# Patient Record
Sex: Male | Born: 1940 | Race: White | Hispanic: No | Marital: Married | Smoking: Former smoker
Health system: Southern US, Community
[De-identification: ages and names within clinical notes are randomized; demographics above are authoritative.]

## PROBLEM LIST (undated history)

## (undated) DIAGNOSIS — I208 Other forms of angina pectoris: Secondary | ICD-10-CM

## (undated) DIAGNOSIS — F329 Major depressive disorder, single episode, unspecified: Secondary | ICD-10-CM

## (undated) DIAGNOSIS — I2089 Other forms of angina pectoris: Secondary | ICD-10-CM

## (undated) DIAGNOSIS — F4310 Post-traumatic stress disorder, unspecified: Secondary | ICD-10-CM

## (undated) DIAGNOSIS — R413 Other amnesia: Secondary | ICD-10-CM

## (undated) DIAGNOSIS — E785 Hyperlipidemia, unspecified: Secondary | ICD-10-CM

## (undated) DIAGNOSIS — I2510 Atherosclerotic heart disease of native coronary artery without angina pectoris: Secondary | ICD-10-CM

## (undated) DIAGNOSIS — J189 Pneumonia, unspecified organism: Secondary | ICD-10-CM

## (undated) DIAGNOSIS — I10 Essential (primary) hypertension: Secondary | ICD-10-CM

## (undated) DIAGNOSIS — D696 Thrombocytopenia, unspecified: Secondary | ICD-10-CM

## (undated) DIAGNOSIS — E559 Vitamin D deficiency, unspecified: Secondary | ICD-10-CM

## (undated) DIAGNOSIS — I5189 Other ill-defined heart diseases: Secondary | ICD-10-CM

## (undated) DIAGNOSIS — F32A Depression, unspecified: Secondary | ICD-10-CM

## (undated) DIAGNOSIS — I350 Nonrheumatic aortic (valve) stenosis: Secondary | ICD-10-CM

## (undated) DIAGNOSIS — H409 Unspecified glaucoma: Secondary | ICD-10-CM

## (undated) HISTORY — DX: Essential (primary) hypertension: I10

## (undated) HISTORY — DX: Major depressive disorder, single episode, unspecified: F32.9

## (undated) HISTORY — DX: Atherosclerotic heart disease of native coronary artery without angina pectoris: I25.10

## (undated) HISTORY — DX: Thrombocytopenia, unspecified: D69.6

## (undated) HISTORY — DX: Hyperlipidemia, unspecified: E78.5

## (undated) HISTORY — DX: Pneumonia, unspecified organism: J18.9

## (undated) HISTORY — PX: CAROTID ENDARTERECTOMY: SUR193

## (undated) HISTORY — DX: Other forms of angina pectoris: I20.8

## (undated) HISTORY — DX: Nonrheumatic aortic (valve) stenosis: I35.0

## (undated) HISTORY — DX: Post-traumatic stress disorder, unspecified: F43.10

## (undated) HISTORY — PX: TRANSTHORACIC ECHOCARDIOGRAM: SHX275

## (undated) HISTORY — DX: Depression, unspecified: F32.A

## (undated) HISTORY — DX: Other forms of angina pectoris: I20.89

## (undated) HISTORY — DX: Vitamin D deficiency, unspecified: E55.9

## (undated) NOTE — *Deleted (*Deleted)
Attempted to give PM meds in applesauce. Patient able to swallow small amount successfully.  Patient then took remaining med

---

## 2003-04-24 ENCOUNTER — Encounter: Payer: Self-pay | Admitting: Emergency Medicine

## 2003-04-24 ENCOUNTER — Emergency Department (HOSPITAL_COMMUNITY): Admission: EM | Admit: 2003-04-24 | Discharge: 2003-04-24 | Payer: Self-pay | Admitting: Emergency Medicine

## 2004-01-02 ENCOUNTER — Ambulatory Visit (HOSPITAL_COMMUNITY): Admission: RE | Admit: 2004-01-02 | Discharge: 2004-01-02 | Payer: Self-pay | Admitting: Vascular Surgery

## 2004-01-08 ENCOUNTER — Encounter (INDEPENDENT_AMBULATORY_CARE_PROVIDER_SITE_OTHER): Payer: Self-pay | Admitting: *Deleted

## 2004-01-08 ENCOUNTER — Inpatient Hospital Stay (HOSPITAL_COMMUNITY): Admission: RE | Admit: 2004-01-08 | Discharge: 2004-01-09 | Payer: Self-pay | Admitting: Vascular Surgery

## 2006-11-07 ENCOUNTER — Emergency Department (HOSPITAL_COMMUNITY): Admission: EM | Admit: 2006-11-07 | Discharge: 2006-11-08 | Payer: Self-pay | Admitting: Emergency Medicine

## 2006-11-08 ENCOUNTER — Ambulatory Visit (HOSPITAL_COMMUNITY): Admission: RE | Admit: 2006-11-08 | Discharge: 2006-11-08 | Payer: Self-pay | Admitting: Emergency Medicine

## 2010-01-13 LAB — FECAL OCCULT BLOOD, GUAIAC: Fecal Occult Blood: NEGATIVE

## 2010-01-21 ENCOUNTER — Encounter: Admission: RE | Admit: 2010-01-21 | Discharge: 2010-01-21 | Payer: Self-pay | Admitting: Family Medicine

## 2010-07-22 LAB — HEMOGLOBIN A1C: Hgb A1c MFr Bld: 5.8 % (ref 4.0–6.0)

## 2010-10-23 LAB — HM COLONOSCOPY

## 2010-10-23 LAB — HM DIABETES FOOT EXAM: HM Diabetic Foot Exam: NEGATIVE

## 2010-11-30 ENCOUNTER — Encounter: Payer: Self-pay | Admitting: *Deleted

## 2010-11-30 DIAGNOSIS — G4730 Sleep apnea, unspecified: Secondary | ICD-10-CM

## 2010-11-30 DIAGNOSIS — G629 Polyneuropathy, unspecified: Secondary | ICD-10-CM

## 2010-11-30 DIAGNOSIS — J449 Chronic obstructive pulmonary disease, unspecified: Secondary | ICD-10-CM

## 2010-11-30 DIAGNOSIS — M109 Gout, unspecified: Secondary | ICD-10-CM | POA: Insufficient documentation

## 2010-11-30 DIAGNOSIS — E785 Hyperlipidemia, unspecified: Secondary | ICD-10-CM

## 2010-11-30 DIAGNOSIS — L719 Rosacea, unspecified: Secondary | ICD-10-CM

## 2010-11-30 DIAGNOSIS — N529 Male erectile dysfunction, unspecified: Secondary | ICD-10-CM

## 2011-01-31 NOTE — Op Note (Signed)
NAME:  Dean Harris, Dean Harris                          ACCOUNT NO.:  [id number]   MEDICAL RECORD NO.:  [id number]                   PATIENT TYPE:  INP   LOCATION:  2899                                 FACILITY:  [facility]   PHYSICIAN:  Janetta Hora. Fields, MD               DATE OF BIRTH:  08-27-1941   DATE OF PROCEDURE:  01/08/2004  DATE OF DISCHARGE:                                 OPERATIVE REPORT   PROCEDURE PERFORMED:  Left carotid endarterectomy.   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   SURGEON:  Janetta Hora. Fields, MD   ANESTHESIA:  General.   ASSISTANT:  1. Larina Earthly, M.D.  2. Claudette Royston Sinner, N.P.   INDICATIONS FOR PROCEDURE:  The patient is a 70 year old male who is status  post previous right carotid endarterectomy in [place].  He presented for  routine duplex surveillance follow-up and was noted to have a greater than  80% stenosis by duplex scan.  A carotid angiogram was performed which showed  greater than 75% left internal carotid artery stenosis.  The reason for  carotid angiography was the high location of the lesion.  On ultrasound this  was up around the location of the mandible.  The carotid bifurcation on the  angiogram was approximately at the level of C3.  The patient also had a very  short neck.  The risks, benefits and possible complications of the operation  including but not limited to bleeding, myocardial infarction, stroke,  cranial nerve injury were explained to the patient preoperatively.   OPERATIVE FINDINGS:  1. A 10 French shunt.  2. Greater than 75% internal carotid artery stenosis.   DESCRIPTION OF PROCEDURE:  After obtaining informed consent, the patient was  taken to the operating room.  The patient was placed in supine position on  the operating table.  After induction of general  anesthesia and endotracheal  intubation, a Foley catheter was placed.  Next, the patient's entire left  neck and chest were prepped and draped in the usual sterile fashion.  An  oblique incision was made just across the anterior border of the  sternocleidomastoid muscle on the left side of the neck.  This was carried  down through the subcutaneous tissues down through the platysma.  Dissection  then proceeded along the anterior border of the left sternocleidomastoid  muscle.  The jugular vein was identified, the common facial vein was  identified and dissected free circumferentially and ligated between 2-0 silk  ties.  Dissection then proceeded down onto the level of the common carotid  artery.  This was dissected free circumferentially and an umbilical tape  placed around this.  The vagus nerve was identified and protected from  harm's way.  Dissection then proceeded up onto the internal carotid artery.  The patient had a very short neck and exposure of the distal internal  carotid artery was quite high in the neck.  This necessitated extensive  mobilization of the hypoglossal nerve.  The occipital branch of the external  carotid artery was also ligated between silk ties.  The posterior belly of  the digastric was also divided with cautery.  At this point there was enough  mobilization to have a portion of internal carotid artery that was free of  plaque on palpation.  Of note, the patient did become briefly hypertensive  on mobilization of the distal internal carotid artery.  This was transient  and came back down to his baseline blood pressure in the 140's within  approximately two minutes.  Next, the dissection proceeded to the external  carotid artery.  The superior thyroid artery was dissected free  circumferentially.  The external carotid artery was dissected free  circumferentially.  Both of these were controlled with vessel loops.  The  ansa cervicalis was divided right at the base  of the hypoglossal nerve for  extra mobilization.  Next, the patient was given 7000 units of intravenous  heparin.  Distal internal carotid artery was clamped with serrefine clamp.  The external and superior thyroid artery was then controlled with vessel  loops.  The common carotid artery controlled with an angled DeBakey clamp.  Next, an 11 blade was used to create an arteriotomy in the common carotid  artery.  Potts scissors were then used to  extend the arteriotomy up into  the distal internal carotid artery.  There was a tight stenosis at the level  of the carotid bifurcation.  There was also a greater than 80% stenosis.  In  the distal internal carotid artery there was a tongue of posterior plaque  also that extended up to the uppermost portions of the dissection.  Next, a  10 [place] shunt was brought up into the operative field, placed into the  distal internal carotid artery and allowed to back-bleed.  The back-bleeding  was brisk.  Next the shunt was threaded down into the common carotid artery  and controlled proximally with a Rumel tourniquet.  Flow was then restored  to the brain with approximately six minutes of ischemia time.  There was  still some back-bleeding from the external carotid artery at this point so  this was controlled in addition with a Gregory clamp.  Next, the  endarterectomy was begun in a suitable plane near the external carotid  artery. This was dissected free circumferentially down to the level of the  common carotid artery and it was transected.  The plaque was then carefully  removed up into the level of the internal carotid artery to a suitable end  point.  This feathered off nicely.  All loose debris was then removed from  the carotid artery.  The external carotid artery was endarterectomized by  eversion technique.  Next, the artery was thoroughly irrigated with  heparinized saline.  A Dacron patch was then brought up in the operative field and sewn on  as a patch angioplasty using a running 6-0 Prolene suture.  Just prior to completion of the anastomosis, the shunt was clamped.  The  distal  end was removed and the internal carotid artery allowed to back-  bleed. This was then clamped with a small serrefine clamp.  Proximal end of  the shunt was then removed from the common carotid artery and this was also  flushed thoroughly forward and  clamped with an angled DeBakey clamp.  The  external carotid artery was allowed to back-bleed thoroughly and then  controlled with a vessel loop.  This was then thoroughly irrigated with  heparinized saline.  The anastomosis was then secured. The distal internal  carotid artery clamp was released to allow back-bleeding into the carotid  artery.  This was then clamped with digital pressure and the external  carotid and common carotid artery opened with flow restored to the external  carotid artery for approximately five cardiac cycles.  The flow was then  restored to the internal carotid artery.  There was some bleeding at the  distal end point before the common carotid artery was again briefly clamped  and two 7-0 Prolene tacking sutures up near the distal edge of the patch.  Hemostasis was then obtained with this.  The carotid artery was then  inspected with Doppler and found to have good biphasic flow in the distal  internal carotid artery and the external carotid arteries.  The wound was  thoroughly irrigated with normal saline solution.  The platysma was then  reapproximated using a running 3-0 Vicryl suture. The skin was then closed  with a 4-0 Vicryl subcuticular stitch.   The patient tolerated the procedure well.  There were no complications.  Sponge, needle and instrument counts were correct at the end of the case.  The patient was awakened in the operating room and neurologically symmetric  in his upper and lower extremities at the end of the case.  There was mild  deviation to the left of the  tongue.  The patient was taken to the recovery  room in stable condition.                                               Janetta Hora. Fields, MD    CEF/MEDQ  D:  01/08/2004  T:  01/08/2004  Job:  [id number]

## 2011-01-31 NOTE — H&P (Signed)
NAME:  Dean Harris, Dean Harris                          ACCOUNT NO.:  [id number]   MEDICAL RECORD NO.:  [id number]                   PATIENT TYPE:  INP   LOCATION:                                       FACILITY:  [facility]   PHYSICIAN:  Carolyn A. Thelma Barge, P.A.            DATE OF BIRTH:  1940/12/01   DATE OF ADMISSION:  01/08/2004  DATE OF DISCHARGE:                                HISTORY & PHYSICAL   CHIEF COMPLAINT:  Left internal carotid artery stenosis.   HISTORY OF PRESENT ILLNESS:  Dean Harris is a very-pleasant 70 year old  Caucasian male who was referred from Dr. Magnus Sinning. Rice for evaluation of  carotid artery disease.  The patient is status post right carotid  endarterectomy in November of 2004, which was completed in [place].  A  recent duplex showed a widely patent right carotid and severe left internal  carotid stenosis, greater than 80%.  Dean Harris recovered quite well from his  right carotid endarterectomy except for mild, marginal mandibular nerve  symptoms on the right side.  The patient also admits to an episode of right  arm clumsiness and weakness approximately one month ago.  He also admits to  an episode of amaurosis fugax which lasted about 15 minutes.  He has had no  further episodes since then.  He denies any prior TIA or CVA.  The patient  was seen in consultation by Dr. Darrick Penna on December 29, 2003.  Dr. Darrick Penna'  impression was that he did have severe left internal carotid artery stenosis  and would benefit from an arch arterial aortogram as well as a left carotid  arteriogram to evaluate the extent of his left internal carotid artery  stenosis.  This was planned for January 02, 2004.  The risks, benefits and  alternatives to the procedure were discussed with the patient at that time,  and he agreed to proceed.   Dr. Darrick Penna then went ahead with an arch aortogram, and left carotid  angiogram was the first order of  catheterization of the left common carotid  artery  completed on January 02, 2004.  This showed severe left internal  carotid artery stenosis greater than 80% with the end of the stenosis being  at the base of the vertebral body C2 and most of the stenosis at a level of  approximately C3, and the carotid bifurcation at the level of the body of  C4.  Since the patient had a normal duplex exam, and the arch aortogram did  not suggest any significant stenosis of the previously endarterectomized  segment of the right carotid, a selective right common carotid artery  injection was not performed.   Dr. Darrick Penna then discussed with the patient the plan to proceed with elective  left carotid endarterectomy for severe left internal carotid stenosis.  The  patient was in understanding and agreed to proceed with surgery.  The risks,  benefits  and alternatives were discussed with the patient at that time, and  the plan was made for surgery on January 08, 2004.   Currently, the patient denies any neurologic symptoms, including amaurosis  fugax, weakness, numbness, changes in vision or hearing, or changes in  strength.  He denies any fevers, chills or night sweats.  He denies any  dysuria, urgency or frequency.  He denies any diarrhea, constipation or  hematochezia.   PAST MEDICAL HISTORY/SURGICAL HISTORY:  1. Extracerebral cardiovascular occlusive disease as stated above, status     post right carotid endarterectomy in November of 2004.  2. Status post angiogram January 02, 2004.  3. Dyslipidemia.  4. Diabetes mellitus type 2, diagnosed three years ago.  5. History of gout.  6. Sleep apnea.  7. Rosacea.   ALLERGIES:  Generic tetracycline causes a rash.   CURRENT MEDICATIONS:  1. Potassium 10 mEq p.o. daily.  2. Lasix 40 mg p.o. daily.  3. Actos 30 mg p.o. daily.  4. Allopurinol 100 mg p.o. b.i.d.  5. Doxycycline 100 mg p.o. b.i.d.  6. Zocor 40 mg p.o. daily.  7. Albuterol inhaler as needed.  8. Atrovent inhaler as needed.  9. Aspirin 325 mg p.o.  daily.   SOCIAL HISTORY:  Dean Harris is married and has one child.  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  [place].  He admits to a 35-year-history of smoking, 1-  1/2 packs a day of which he quit in 1995.  The patient denies any alcohol  use for the past 35 years.   FAMILY HISTORY:  The patient's mother has a positive history of vascular  disease as well as hypertension, diabetes and coronary artery disease.  His  father was a paraplegic for 30 years and passed away from colon cancer.  The  patient has a brother who also has  history of vascular disease and  hypertension as well as hypercholesterolemia.   REVIEW OF SYSTEMS:  Please see HPI for pertinent positives and negatives;  otherwise, as follows:  HEENT:  The patient denies any dysphagia, neck pain or stiffness.  He wears  eyeglasses at all times.  He denies any history of headache, changes in  vision or changes in hearing.  RESPIRATORY:  The patient admits to having  sleep apnea and utilizes BiPAP occasionally as needed.  Admits to occasional  wheezing for which he uses albuterol and Atrovent inhalers.  He has typical  shortness of breath with extreme exertion.  CARDIAC:  The patient denies any  chest pain, chest pressure, palpitations or pedal edema.  GI:  The patient  denies any diarrhea, constipation, hematochezia, or changes in appetite.  GU:  The patient denies any dysuria, hematuria, hesitancy or frequency.  MUSCULOSKELETAL:  Positive for gout as stated above, otherwise, no stiffness  or swallowing.  HEMATOLOGIC:  The patient denies any fever, fatigue, night  sweats, easy bruising or bleeding.   PHYSICAL EXAMINATION:  VITAL SIGNS:  Blood pressure 161/84, heart rate 69  and regular, respiratory rate 18 and unlabored.  Temperature 97.0 degrees  Fahrenheit.  SPO2 is 99% on room air.  Height 5 feet, 3 inches.  Weight 180  pounds. GENERAL APPEARANCE:   This is a pleasant, 70 year old Caucasian male who is  in no acute distress, alert and oriented x3.  He is pleasant and  appropriate.  HEENT:  PERRLA, EOMI, oral mucosa is moist and  pink.  NECK:  Full range of motion.  There is a harsh, left carotid bruits.  There  is no evidence of right carotid bruits.  No thyromegaly or lymphadenopathy.  CHEST:  Breathing is unlabored.  Breath sounds are clear throughout.  There  are no wheezes, rhonchi or rubs.  CARDIAC:  Irregular rate and rhythm without murmur, gallop or rub.  ABDOMEN:  Somewhat obese.  Bowel sounds are present in all four quadrants,  soft, nontender, nondistended with no organomegaly or masses.  GENITOURINARY:  Deferred.  VASCULAR:  The patient's peripheral pulses are as follows:  There is 2+  carotid pulses bilaterally, 2+ radial pulses bilaterally, 2+ femoral pulses  bilaterally, 2+ posterior tibial pulses bilaterally.  EXTREMITIES:  The patient's extremities are warm and dry without edema.  He  has no evidence of varicosities or venous stasis changes.  NEUROLOGIC:  Alert and oriented x3, Cranial nerves II-XII are grossly  intact.  Gait is steady.  Extremities are equal and appropriate strength  throughout.  The patient does have evidence of a slight mouth droop on the  right which he states is present from his prior right carotid endarterectomy  surgery.   LABORATORY DATA:  CBC completed on January 01, 2004, WBC 5.3, hemoglobin 14.8,  hematocrit 43.0, and platelets of 164,000.  PT was 13.3, and PTT was 33.  INR 1.0 from that same day.  BMET completed on April 18 showed sodium of  141, potassium of 4.4, chloride 108, cO2 30, glucose 119, BUN 15, creatinine  1.1 and calcium 9.3.   A 12-lead EKG completed on January 01, 2004, reads normal sinus rhythm with no  ST changes.  Normal ventricular rate and normal PR intervals.   Chest x-ray completed on January 01, 2004, reads no evidence of acute  cardiopulmonary process.  There is no  focal consolidation, edema or pleural  effusion.   Carotid duplex and carotid angiogram as stated in the HPI.   ASSESSMENT/PLAN:  1. Dean Harris is a pleasant 70 year old male who has asymptomatic severe left     internal carotid artery stenosis.  We will continue as planned with left     carotid endarterectomy completed by Dr. Darrick Penna on January 08, 2004.  2. The risks, benefits and alternatives have been discussed with the     patient, and he is in understanding and wishes to proceed with surgery.  3. The patient was asked to stop his aspirin today in anticipation of     surgery on Monday.                                                Carolyn A. Eustaquio Boyden.    CAF/MEDQ  D:  01/05/2004  T:  01/07/2004  Job:  [id number]   cc:   Janetta Hora. Fields, MD  [address] [place], [place] [address]   Magnus Sinning. Dimple Casey, M.D.  [address] [place]  [place] [address]  Fax: [phone]

## 2011-01-31 NOTE — Op Note (Signed)
NAME:  Dean Harris, Dean Harris                          ACCOUNT NO.:  [id number]   MEDICAL RECORD NO.:  [id number]                   PATIENT TYPE:  OIB   LOCATION:  2899                                 FACILITY:  [facility]   PHYSICIAN:  Janetta Hora. Fields, MD               DATE OF BIRTH:  24-Oct-1940   DATE OF PROCEDURE:  01/02/2004  DATE OF DISCHARGE:                                 OPERATIVE REPORT   PROCEDURE PERFORMED:  1. Arch aortogram.  2. Left carotid angiogram with first order catheterization of the left     common carotid artery.   PREOPERATIVE DIAGNOSIS:  Left internal carotid artery stenosis.   POSTOPERATIVE DIAGNOSIS:  Left internal carotid artery stenosis.   SURGEON:  Janetta Hora. Fields, MD   ANESTHESIA:  Local.   INDICATIONS FOR PROCEDURE:  The patient has a history of prior right carotid  endarterectomy approximately six months ago in [place].  He had a recent  duplex ultrasound for follow-up at our office which showed the right carotid  was widely patent. The left internal carotid artery had a significant  stenosis greater than 80% and it was thought that the stenosis was very high  up under the level of the mandible.  The left carotid stenosis has been  asymptomatic.   OPERATIVE FINDINGS:  Severe left internal carotid artery stenosis greater  than 80% with the end of the stenosis being at the base of vertebral body C2  and most of the stenosis at the level of approximately C3 and the carotid  bifurcation at the level of the body of C4.  The left internal carotid  artery stenosis was 75%.   DESCRIPTION OF PROCEDURE:  After obtaining informed consent from the patient  which included risks of bleeding, infection and risk of stroke, the patient  was brought to the peripheral vascular suite.  The patient's right groin was  prepped and draped in the usual sterile fashion.  Local anesthesia was  infiltrated over the right common femoral artery.  The right common femoral  artery was then cannulated with a Majestic needle.  0.035 guidewire was then  introduced into the right common femoral artery into the abdominal aorta  under fluoroscopic guidance.  The needle was removed and a 5 French sheath  placed over the guidewire into the right common femoral artery.  Next, a  0.035 Wholey wire was advanced with a pigtail catheter into the ascending  aorta.  An arch aortogram was then obtained.  The patient had normal arch  configuration with the origin of the innominate left common carotid artery  and left subclavian arteries normal in appearance.  The left vertebral  artery has a high grade stenosis at its origin.  The right vertebral artery  is not visualized.  The origin of the right common carotid artery is widely  patent.  The origin of the right common carotid artery was widely patent.  Next, the pigtail was removed and an H1 catheter was placed over the  guidewire and the left common carotid artery was selectively cannulated  after directing the guidewire up into the common carotid artery.  Selective  injection of the left common carotid artery was then performed in an AP and  lateral projection.  Intracranial views were also performed in AP and  lateral projection and these will be interpreted by the neuroradiologist.  On the lateral view the stenosis by NASA criteria is 75% on the left  internal carotid artery.  There is also some pooling at the bifurcation  suggesting mild ulceration of the left internal carotid artery.   Since the patient had a normal duplex examination and the arch aortogram did  not suggest any significant stenosis of the previously endarterectomized  segment as well as in agreement in the duplex scan, a selective right common  carotid artery injection was not performed.  Next, the guidewire was placed  back to the H1 catheter and these were removed as a unit. Next, the sheath  was removed and hemostasis obtained with direct pressure.   The patient  tolerated the procedure well and there were no complications.   The patient tolerated the procedure well.  There were no complications.  Sponge, needle and instrument counts were correct at the end of the case.  The patient was then transferred to the recovery room in stable condition.                                               Janetta Hora. Fields, MD    CEF/MEDQ  D:  01/02/2004  T:  01/03/2004  Job:  [id number]

## 2011-01-31 NOTE — Consult Note (Signed)
NAME:  Dean Harris, Dean Harris                          ACCOUNT NO.:  [id number]   MEDICAL RECORD NO.:  [id number]                   PATIENT TYPE:  OIB   LOCATION:  2899                                 FACILITY:  [facility]   PHYSICIAN:  Janeece Riggers. Karin Golden, M.D.                DATE OF BIRTH:  1940-10-18   DATE OF CONSULTATION:  01/02/2004  DATE OF DISCHARGE:  01/02/2004                                   CONSULTATION   REASON FOR CONSULTATION:  Consultation for interpretation of intracranial  views of cerebral arteriogram done January 02, 2004, by Dr. Darrick Penna.   HISTORY:  Left carotid stenosis by duplex.   RIGHT INTERNAL CAROTID ARTERY ARTERIOGRAM:  This vessel was opacified via a  common carotid injection.  The internal carotid artery is gracile but does  not show a focal stenosis.  Flow from this injection supplies the left,  middle, and anterior cerebral artery territories.  No evidence of aneurysm,  stenosis, or vascular malformation. The right side was not studied.   IMPRESSION:  Intrinsically normal anterior intracranial circulation on the  left.  The vessels are gracile consistent with diminished inflow because of  a stenosis in the neck.                                               Mark E. Karin Golden, M.D.    MES/MEDQ  D:  03/31/2004  T:  03/31/2004  Job:  [id number]

## 2012-01-14 DIAGNOSIS — I350 Nonrheumatic aortic (valve) stenosis: Secondary | ICD-10-CM

## 2012-01-14 DIAGNOSIS — I2510 Atherosclerotic heart disease of native coronary artery without angina pectoris: Secondary | ICD-10-CM

## 2012-01-14 HISTORY — DX: Atherosclerotic heart disease of native coronary artery without angina pectoris: I25.10

## 2012-01-14 HISTORY — DX: Nonrheumatic aortic (valve) stenosis: I35.0

## 2012-01-14 HISTORY — PX: TRANSTHORACIC ECHOCARDIOGRAM: SHX275

## 2012-02-11 ENCOUNTER — Emergency Department (HOSPITAL_COMMUNITY): Payer: Medicare Other

## 2012-02-11 ENCOUNTER — Encounter (HOSPITAL_COMMUNITY): Payer: Self-pay | Admitting: Emergency Medicine

## 2012-02-11 ENCOUNTER — Inpatient Hospital Stay (HOSPITAL_COMMUNITY)
Admission: EM | Admit: 2012-02-11 | Discharge: 2012-02-14 | DRG: 287 | Disposition: A | Discharge status: Home / Self Care | Payer: Medicare Other | Attending: Internal Medicine | Admitting: Internal Medicine

## 2012-02-11 DIAGNOSIS — G4730 Sleep apnea, unspecified: Secondary | ICD-10-CM | POA: Diagnosis present

## 2012-02-11 DIAGNOSIS — E119 Type 2 diabetes mellitus without complications: Secondary | ICD-10-CM | POA: Diagnosis present

## 2012-02-11 DIAGNOSIS — N529 Male erectile dysfunction, unspecified: Secondary | ICD-10-CM | POA: Diagnosis present

## 2012-02-11 DIAGNOSIS — J449 Chronic obstructive pulmonary disease, unspecified: Secondary | ICD-10-CM

## 2012-02-11 DIAGNOSIS — I200 Unstable angina: Secondary | ICD-10-CM | POA: Diagnosis present

## 2012-02-11 DIAGNOSIS — R079 Chest pain, unspecified: Secondary | ICD-10-CM

## 2012-02-11 DIAGNOSIS — G629 Polyneuropathy, unspecified: Secondary | ICD-10-CM | POA: Diagnosis present

## 2012-02-11 DIAGNOSIS — E785 Hyperlipidemia, unspecified: Secondary | ICD-10-CM | POA: Diagnosis present

## 2012-02-11 DIAGNOSIS — E1165 Type 2 diabetes mellitus with hyperglycemia: Secondary | ICD-10-CM

## 2012-02-11 DIAGNOSIS — J9610 Chronic respiratory failure, unspecified whether with hypoxia or hypercapnia: Secondary | ICD-10-CM | POA: Diagnosis present

## 2012-02-11 DIAGNOSIS — E1142 Type 2 diabetes mellitus with diabetic polyneuropathy: Secondary | ICD-10-CM | POA: Diagnosis present

## 2012-02-11 DIAGNOSIS — E1149 Type 2 diabetes mellitus with other diabetic neurological complication: Secondary | ICD-10-CM | POA: Diagnosis present

## 2012-02-11 DIAGNOSIS — E7800 Pure hypercholesterolemia, unspecified: Secondary | ICD-10-CM | POA: Diagnosis present

## 2012-02-11 DIAGNOSIS — E782 Mixed hyperlipidemia: Secondary | ICD-10-CM

## 2012-02-11 DIAGNOSIS — M109 Gout, unspecified: Secondary | ICD-10-CM | POA: Diagnosis present

## 2012-02-11 DIAGNOSIS — J4489 Other specified chronic obstructive pulmonary disease: Secondary | ICD-10-CM | POA: Diagnosis present

## 2012-02-11 DIAGNOSIS — L719 Rosacea, unspecified: Secondary | ICD-10-CM

## 2012-02-11 DIAGNOSIS — I2510 Atherosclerotic heart disease of native coronary artery without angina pectoris: Principal | ICD-10-CM | POA: Insufficient documentation

## 2012-02-11 DIAGNOSIS — K3184 Gastroparesis: Secondary | ICD-10-CM | POA: Diagnosis present

## 2012-02-11 LAB — POCT I-STAT TROPONIN I

## 2012-02-11 LAB — DIFFERENTIAL
Basophils Absolute: 0 10*3/uL (ref 0.0–0.1)
Lymphocytes Relative: 7 % — ABNORMAL LOW (ref 12–46)
Lymphs Abs: 0.8 10*3/uL (ref 0.7–4.0)
Neutro Abs: 9.8 10*3/uL — ABNORMAL HIGH (ref 1.7–7.7)
Neutrophils Relative %: 87 % — ABNORMAL HIGH (ref 43–77)

## 2012-02-11 LAB — APTT: aPTT: 31 seconds (ref 24–37)

## 2012-02-11 LAB — COMPREHENSIVE METABOLIC PANEL
Albumin: 4.2 g/dL (ref 3.5–5.2)
BUN: 26 mg/dL — ABNORMAL HIGH (ref 6–23)
Calcium: 9.2 mg/dL (ref 8.4–10.5)
Creatinine, Ser: 1.06 mg/dL (ref 0.50–1.35)
Sodium: 134 mEq/L — ABNORMAL LOW (ref 135–145)
Total Protein: 6.6 g/dL (ref 6.0–8.3)

## 2012-02-11 LAB — CBC
HCT: 37.6 % — ABNORMAL LOW (ref 39.0–52.0)
Hemoglobin: 13.2 g/dL (ref 13.0–17.0)
MCHC: 35.1 g/dL (ref 30.0–36.0)
RBC: 4.51 MIL/uL (ref 4.22–5.81)
WBC: 11.2 10*3/uL — ABNORMAL HIGH (ref 4.0–10.5)

## 2012-02-11 LAB — CARDIAC PANEL(CRET KIN+CKTOT+MB+TROPI)
Relative Index: 2.8 — ABNORMAL HIGH (ref 0.0–2.5)
Total CK: 201 U/L (ref 7–232)

## 2012-02-11 LAB — PROTIME-INR: INR: 1.03 (ref 0.00–1.49)

## 2012-02-11 MED ORDER — NITROGLYCERIN 0.4 MG SL SUBL: 0.4000 mg | SUBLINGUAL_TABLET | SUBLINGUAL | Status: DC | PRN

## 2012-02-11 MED ORDER — VITAMIN D 1000 UNITS PO TABS
2000.0000 [IU] | ORAL_TABLET | Freq: Every day | ORAL | Status: DC
  Administered 2012-02-12 – 2012-02-14 (×3): 2000 [IU] via ORAL
  Filled 2012-02-11 (×3): qty 2

## 2012-02-11 MED ORDER — ASPIRIN EC 325 MG PO TBEC: 325.0000 mg | DELAYED_RELEASE_TABLET | Freq: Every day | ORAL | Status: DC

## 2012-02-11 MED ORDER — FLUTICASONE-SALMETEROL 100-50 MCG/DOSE IN AEPB
1.0000 | INHALATION_SPRAY | Freq: Two times a day (BID) | RESPIRATORY_TRACT | Status: DC
  Administered 2012-02-12 – 2012-02-14 (×5): 1 via RESPIRATORY_TRACT
  Filled 2012-02-11: qty 14

## 2012-02-11 MED ORDER — TRAVOPROST (BAK FREE) 0.004 % OP SOLN
1.0000 [drp] | Freq: Every day | OPHTHALMIC | Status: DC
  Administered 2012-02-12 – 2012-02-13 (×2): 1 [drp] via OPHTHALMIC
  Filled 2012-02-11: qty 2.5

## 2012-02-11 MED ORDER — SODIUM CHLORIDE 0.9 % IV SOLN: Freq: Once | INTRAVENOUS | Status: DC

## 2012-02-11 MED ORDER — ASPIRIN EC 325 MG PO TBEC
325.0000 mg | DELAYED_RELEASE_TABLET | Freq: Every day | ORAL | Status: DC
  Administered 2012-02-12 – 2012-02-14 (×3): 325 mg via ORAL
  Filled 2012-02-11 (×4): qty 1

## 2012-02-11 MED ORDER — SIMVASTATIN 40 MG PO TABS
40.0000 mg | ORAL_TABLET | Freq: Every evening | ORAL | Status: DC
  Administered 2012-02-12: 40 mg via ORAL
  Filled 2012-02-11 (×3): qty 1

## 2012-02-11 MED ORDER — ASPIRIN EC 325 MG PO TBEC
325.0000 mg | DELAYED_RELEASE_TABLET | Freq: Once | ORAL | Status: DC
  Filled 2012-02-11: qty 1

## 2012-02-11 MED ORDER — LISINOPRIL 10 MG PO TABS
10.0000 mg | ORAL_TABLET | Freq: Every day | ORAL | Status: DC
  Administered 2012-02-12 – 2012-02-14 (×3): 10 mg via ORAL
  Filled 2012-02-11 (×3): qty 1

## 2012-02-11 NOTE — H&P (Signed)
[facility] History and Physical  Dean Harris OZH:[id number] DOB: [birth year] DOA: 02/11/2012   PCP: Rudi Heap, MD, MD   Chief Complaint: chest pain   HPI:  71 yo man with hx of bilat CEA, DM, HL, remote tobacco use, presented to the Ed after a brief episode of retrosternal chest pain associated with dyspnea and diaphoresis. By the time he arrived the chest pain has subsided. He remembers having a cardiac cath about 8 years ago and he was told it was OK. He ha snot had any more chest pain since he has been in the Ed. Denies any recent travel, cough, fever, sputum.  Review of Systems:  Per HPI , allother systems reviewed and negative  Past Medical History  Diagnosis Date  . Pneumonia     x2  . High cholesterol   . Diabetes mellitus   . CAD (coronary artery disease)    Past Surgical History  Procedure Date  . Carotid endarterectomy    Social History:  reports that he has quit smoking. His smoking use included Cigarettes. He does not have any smokeless tobacco history on file. He reports that he does not drink alcohol or use illicit drugs.  Allergies  Allergen Reactions  . Tetracyclines & Related     Generic only    Family History  Problem Relation Age of Onset  . Diabetes Mother   . Stroke Mother   . Hypertension Mother   . Hyperlipidemia Mother   . Cancer Father   . Hypertension Brother   . Cancer Other     Prior to Admission medications   Medication Sig Start Date End Date Taking? Authorizing Provider  albuterol-ipratropium (COMBIVENT) 18-103 MCG/ACT inhaler Inhale 1 puff into the lungs every 6 (six) hours as needed.     Yes Historical Provider, MD  aspirin 81 MG tablet Take 81 mg by mouth daily.     Yes Historical Provider, MD  Cholecalciferol (VITAMIN D) 2000 UNITS tablet Take 2,000 Units by mouth daily.    Yes Historical Provider, MD  Fluticasone-Salmeterol (ADVAIR DISKUS) 100-50 MCG/DOSE AEPB Inhale 1 puff into the lungs every 12 (twelve) hours.      Yes Historical Provider, MD  lisinopril (PRINIVIL,ZESTRIL) 10 MG tablet Take 10 mg by mouth daily.     Yes Historical Provider, MD  metFORMIN (GLUCOPHAGE) 500 MG tablet Take 250 mg by mouth 2 (two) times daily with a meal.    Yes Historical Provider, MD  sildenafil (VIAGRA) 50 MG tablet Take 50 mg by mouth daily as needed.    Yes Historical Provider, MD  simvastatin (ZOCOR) 40 MG tablet Take 40 mg by mouth every evening.   Yes Historical Provider, MD  travoprost, benzalkonium, (TRAVATAN) 0.004 % ophthalmic solution 1 drop at bedtime.   Yes Historical Provider, MD   Physical Exam: Filed Vitals:   02/11/12 1937  BP: 140/52  Pulse: 98  Temp: 97.5 F (36.4 C)  TempSrc: Oral  Resp: 24  Height: 5\' 3"  (1.6 m)  Weight: 72.576 kg (160 lb)  SpO2: 98%     General:  Alert and oriented x3,   Eyes: PERRLA, EOMI  ENT: rhinophyma, normal external ears, clear throat  Neck: no JVD, bilat CEA scars, no carotid bruits  Cardiovascular: 3/6 systolic murmur best heard at the 2nd intercostal space, no rub, regular, normal S1, split S2  Respiratory: CTAB, no W/R/C  Abdomen: soft, NT, Bs present  Skin: no rashes  Musculoskeletal: intact , normal muscle tone and bulk  Psychiatric: euthymic  Neurologic: Cn 2-12 intact, strength 5/5 all 4, sensation intact   Labs on Admission:  Basic Metabolic Panel:  Lab 02/11/12 1610  NA 134*  K 4.2  CL 99  CO2 22  GLUCOSE 235*  BUN 26*  CREATININE 1.06  CALCIUM 9.2  MG --  PHOS --   Liver Function Tests:  Lab 02/11/12 2050  AST 20  ALT 17  ALKPHOS 52  BILITOT 1.0  PROT 6.6  ALBUMIN 4.2   No results found for this basename: LIPASE:5,AMYLASE:5 in the last 168 hours No results found for this basename: AMMONIA:5 in the last 168 hours CBC:  Lab 02/11/12 2050  WBC 11.2*  NEUTROABS 9.8*  HGB 13.2  HCT 37.6*  MCV 83.4  PLT 167   Cardiac Enzymes: No results found for this basename: CKTOTAL:5,CKMB:5,CKMBINDEX:5,TROPONINI:5 in the last  168 hours BNP: No components found with this basename: POCBNP:5 CBG: No results found for this basename: GLUCAP:5 in the last 168 hours  Radiological Exams on Admission: Dg Chest 2 View  02/11/2012  *RADIOLOGY REPORT*  Clinical Data: Shortness of breath, heartburn, and central chest pain.  CHEST - 2 VIEW  Comparison: 01/01/2004  Findings: Normal heart size and pulmonary vascularity.  Calcified granulomas in the right midlung.  Mild central peribronchial thickening may suggest chronic bronchitis.  No focal airspace consolidation in the lungs.  No blunting of costophrenic angles. No pneumothorax.  Calcification of the aorta.  Degenerative changes in the spine.  Postoperative changes in the base of the neck.  No significant changes since the previous study.  IMPRESSION: Chronic bronchitic changes.  No evidence of active pulmonary disease.  Original Report Authenticated By: Marlon Pel, M.D.    EKG: Independently reviewed. NSR, no ST, T changes  Assessment/Plan Principal Problem:  *Unstable angina Active Problems:  Sleep apnea  Gout  Diabetes mellitus  Hyperlipidemia  COPD (chronic obstructive pulmonary disease)  ED (erectile dysfunction)  Peripheral neuropathy  High cholesterol   1. Unstable angina - this patient has a very high likelihood of CAD so I am going to interpret his symptoms as unstable angina - his TIMI score is 3-4 which puts him into moderate risk category for MI, sudden death, need for urgent revasc. On the positive side are absence of EKG changes and normal troponin I initial levels. Also the chest pain has subsided. Patient will be admitted to telemetry and started on aspirin - if markers are positive or chest pain recurs will start anticoagulation as well. Cardiology consultation will be obtained in AM. We shall recheck Ck, ckMB and troponin I through the night. 2. DM 2- last documented A1C is 5.8 indicating great control - will use SSI novolog on house. Hold  metformin for now in case he needs cardiac cath 3. COPD - currently asymptomatic - cont advair 4. HL - recheck FLP in AM - cont Zocor Code Status: full Family Communication: wife Disposition Plan: home  Kelvyn Schunk, MD  [facility] Pager [phone]  If 7PM-7AM, please contact night-coverage [email or web address] Password [email or web address] 02/11/2012, 10:52 PM

## 2012-02-11 NOTE — ED Provider Notes (Signed)
History     CSN: [id number]  Arrival date & time 02/11/12  1929   First MD Initiated Contact with Patient 02/11/12 2038      Chief Complaint  Patient presents with  . Chest Pain    (Consider location/radiation/quality/duration/timing/severity/associated sxs/prior treatment) Patient is a 71 y.o. male presenting with chest pain. The history is provided by the patient.  Chest Pain    patient here with chest pain that began today while he was doing his yard. Described as substernal heaviness associated with dyspnea diaphoresis. Symptoms improved with rest. He took 4 baby aspirin prior to arrival. Denies any prior history of CAD however he does have multiple risk factors including hypertension diabetes and hypercholesterolemia. Does have a remote history of a negative Persantine cardiac stress test. Symptoms lasted for over 10 minutes and have occurred since and he is chest pain-free  Past Medical History  Diagnosis Date  . Pneumonia     x2  . High cholesterol   . Diabetes mellitus   . CAD (coronary artery disease)     Past Surgical History  Procedure Date  . Carotid endarterectomy     Family History  Problem Relation Age of Onset  . Diabetes Mother   . Stroke Mother   . Hypertension Mother   . Hyperlipidemia Mother   . Cancer Father   . Hypertension Brother   . Cancer Other     History  Substance Use Topics  . Smoking status: Former Games developer  . Smokeless tobacco: Not on file  . Alcohol Use: No      Review of Systems  Cardiovascular: Positive for chest pain.  All other systems reviewed and are negative.    Allergies  Tetracyclines & related  Home Medications   Current Outpatient Rx  Name Route Sig Dispense Refill  . IPRATROPIUM-ALBUTEROL 18-103 MCG/ACT IN AERO Inhalation Inhale 1 puff into the lungs every 6 (six) hours as needed.      . ASPIRIN 81 MG PO TABS Oral Take 81 mg by mouth daily.      Marland Kitchen VITAMIN D 2000 UNITS PO TABS Oral Take 2,000 Units by  mouth daily.     Marland Kitchen FLUTICASONE-SALMETEROL 100-50 MCG/DOSE IN AEPB Inhalation Inhale 1 puff into the lungs every 12 (twelve) hours.      Marland Kitchen LISINOPRIL 10 MG PO TABS Oral Take 10 mg by mouth daily.      Marland Kitchen METFORMIN HCL 500 MG PO TABS Oral Take 250 mg by mouth 2 (two) times daily with a meal.     . SILDENAFIL CITRATE 50 MG PO TABS Oral Take 50 mg by mouth daily as needed.     Marland Kitchen SIMVASTATIN 20 MG PO TABS Oral Take 40 mg by mouth at bedtime.       BP 140/52  Pulse 98  Temp(Src) 97.5 F (36.4 C) (Oral)  Resp 24  Ht 5\' 3"  (1.6 m)  Wt 160 lb (72.576 kg)  BMI 28.34 kg/m2  SpO2 98%  Physical Exam  Nursing note and vitals reviewed. Constitutional: He is oriented to person, place, and time. He appears well-developed and well-nourished.  Non-toxic appearance. No distress.  HENT:  Head: Normocephalic and atraumatic.  Eyes: Conjunctivae, EOM and lids are normal. Pupils are equal, round, and reactive to light.  Neck: Normal range of motion. Neck supple. No tracheal deviation present. No mass present.  Cardiovascular: Normal rate, regular rhythm and normal heart sounds.  Exam reveals no gallop.   No murmur heard. Pulmonary/Chest: Effort normal  and breath sounds normal. No stridor. No respiratory distress. He has no decreased breath sounds. He has no wheezes. He has no rhonchi. He has no rales.  Abdominal: Soft. Normal appearance and bowel sounds are normal. He exhibits no distension. There is no tenderness. There is no rebound and no CVA tenderness.  Musculoskeletal: Normal range of motion. He exhibits no edema and no tenderness.  Neurological: He is alert and oriented to person, place, and time. He has normal strength. No cranial nerve deficit or sensory deficit. GCS eye subscore is 4. GCS verbal subscore is 5. GCS motor subscore is 6.  Skin: Skin is warm and dry. No abrasion and no rash noted.  Psychiatric: He has a normal mood and affect. His speech is normal and behavior is normal.    ED Course    Procedures (including critical care time)   Labs Reviewed  CBC  DIFFERENTIAL  COMPREHENSIVE METABOLIC PANEL  PROTIME-INR  APTT   No results found.   No diagnosis found.    MDM   Date: 02/11/2012  Rate: 99  Rhythm: normal sinus rhythm  QRS Axis: normal  Intervals: normal  ST/T Wave abnormalities: normal  Conduction Disutrbances:none  Narrative Interpretation:   Old EKG Reviewed: unchanged   10:07 PM Patient had aspirin prior to arrival. Due to his numerous cardiac risk factors he was admitted for evaluation of chest pain       Toy Baker, MD 02/11/12 2208

## 2012-02-11 NOTE — ED Notes (Signed)
Pt states today he was out in the yard doing some yard work and when he came in the house to rest  Pt states he was sitting on the couch and started having some chest pain in the center of his chest  Pt states he had some shortness of breath, hands became shaky, and developed a headache with pain that ran down the back of his neck   Pt describes the pain felt like his chest was going to explode  Pt states he continues to have pain but not as bad as it was earlier  Pt states he took 4 baby aspirin when the pain started prior to coming in

## 2012-02-11 NOTE — ED Notes (Signed)
Pt states he had a cortisone injection in his shoulder yesterday and his blood sugar has been elevated so this evening he took 500mg  of his metformin instead of just 250mg 

## 2012-02-12 DIAGNOSIS — I359 Nonrheumatic aortic valve disorder, unspecified: Secondary | ICD-10-CM

## 2012-02-12 DIAGNOSIS — E782 Mixed hyperlipidemia: Secondary | ICD-10-CM

## 2012-02-12 DIAGNOSIS — I200 Unstable angina: Secondary | ICD-10-CM

## 2012-02-12 DIAGNOSIS — E1165 Type 2 diabetes mellitus with hyperglycemia: Secondary | ICD-10-CM

## 2012-02-12 LAB — BASIC METABOLIC PANEL
BUN: 21 mg/dL (ref 6–23)
CO2: 23 mEq/L (ref 19–32)
Chloride: 104 mEq/L (ref 96–112)
GFR calc non Af Amer: 74 mL/min — ABNORMAL LOW (ref >90)
Glucose, Bld: 162 mg/dL — ABNORMAL HIGH (ref 70–99)
Potassium: 4.3 mEq/L (ref 3.5–5.1)
Sodium: 138 mEq/L (ref 135–145)

## 2012-02-12 LAB — LIPID PANEL
Cholesterol: 125 mg/dL (ref 0–200)
LDL Cholesterol: 67 mg/dL (ref 0–99)
Total CHOL/HDL Ratio: 2.7 RATIO
VLDL: 11 mg/dL (ref 0–40)

## 2012-02-12 LAB — GLUCOSE, CAPILLARY
Glucose-Capillary: 157 mg/dL — ABNORMAL HIGH (ref 70–99)
Glucose-Capillary: 145 mg/dL — ABNORMAL HIGH (ref 70–99)
Glucose-Capillary: 147 mg/dL — ABNORMAL HIGH (ref 70–99)
Glucose-Capillary: 125 mg/dL — ABNORMAL HIGH (ref 70–99)

## 2012-02-12 LAB — CBC
HCT: 36.4 % — ABNORMAL LOW (ref 39.0–52.0)
Hemoglobin: 12.7 g/dL — ABNORMAL LOW (ref 13.0–17.0)
MCH: 29.2 pg (ref 26.0–34.0)
MCHC: 34.9 g/dL (ref 30.0–36.0)
MCV: 83.7 fL (ref 78.0–100.0)
RBC: 4.35 MIL/uL (ref 4.22–5.81)

## 2012-02-12 LAB — CARDIAC PANEL(CRET KIN+CKTOT+MB+TROPI)
Relative Index: 2.6 — ABNORMAL HIGH (ref 0.0–2.5)
Relative Index: 2.4 (ref 0.0–2.5)
Total CK: 211 U/L (ref 7–232)
Total CK: 234 U/L — ABNORMAL HIGH (ref 7–232)
Troponin I: <0.3 ng/mL (ref <0.30)
Troponin I: <0.3 ng/mL (ref <0.30)

## 2012-02-12 MED ORDER — SODIUM CHLORIDE 0.9 % IJ SOLN: 3.0000 mL | INTRAMUSCULAR | Status: DC | PRN

## 2012-02-12 MED ORDER — ZOLPIDEM TARTRATE 5 MG PO TABS: 5.0000 mg | ORAL_TABLET | Freq: Every evening | ORAL | Status: DC | PRN

## 2012-02-12 MED ORDER — SODIUM CHLORIDE 0.9 % IJ SOLN
3.0000 mL | Freq: Two times a day (BID) | INTRAMUSCULAR | Status: DC
  Administered 2012-02-13 – 2012-02-14 (×2): 3 mL via INTRAVENOUS

## 2012-02-12 MED ORDER — ENOXAPARIN SODIUM 40 MG/0.4ML ~~LOC~~ SOLN
40.0000 mg | SUBCUTANEOUS | Status: DC
Start: 2012-02-12 — End: 2012-02-12
  Administered 2012-02-12: 40 mg via SUBCUTANEOUS
  Filled 2012-02-12: qty 0.4

## 2012-02-12 MED ORDER — SODIUM CHLORIDE 0.9 % IV SOLN
1.0000 mL/kg/h | INTRAVENOUS | Status: DC
  Administered 2012-02-13: 1 mL/kg/h via INTRAVENOUS

## 2012-02-12 MED ORDER — SENNOSIDES-DOCUSATE SODIUM 8.6-50 MG PO TABS
1.0000 | ORAL_TABLET | Freq: Every evening | ORAL | Status: DC | PRN
  Filled 2012-02-12: qty 1

## 2012-02-12 MED ORDER — ACETAMINOPHEN 650 MG RE SUPP: 650.0000 mg | Freq: Four times a day (QID) | RECTAL | Status: DC | PRN

## 2012-02-12 MED ORDER — SODIUM CHLORIDE 0.9 % IV SOLN: 250.0000 mL | INTRAVENOUS | Status: DC | PRN

## 2012-02-12 MED ORDER — ENOXAPARIN SODIUM 80 MG/0.8ML ~~LOC~~ SOLN
80.0000 mg | SUBCUTANEOUS | Status: AC
  Administered 2012-02-12: 80 mg via SUBCUTANEOUS
  Filled 2012-02-12: qty 0.8

## 2012-02-12 MED ORDER — ONDANSETRON HCL 4 MG PO TABS: 4.0000 mg | ORAL_TABLET | Freq: Four times a day (QID) | ORAL | Status: DC | PRN

## 2012-02-12 MED ORDER — INSULIN ASPART 100 UNIT/ML ~~LOC~~ SOLN
0.0000 [IU] | Freq: Three times a day (TID) | SUBCUTANEOUS | Status: DC
  Administered 2012-02-12: 1 [IU] via SUBCUTANEOUS
  Administered 2012-02-12: 2 [IU] via SUBCUTANEOUS
  Administered 2012-02-12: 1 [IU] via SUBCUTANEOUS
  Administered 2012-02-13: 5 [IU] via SUBCUTANEOUS
  Administered 2012-02-13 – 2012-02-14 (×2): 1 [IU] via SUBCUTANEOUS

## 2012-02-12 MED ORDER — DIAZEPAM 5 MG PO TABS
5.0000 mg | ORAL_TABLET | ORAL | Status: AC
  Administered 2012-02-13: 5 mg via ORAL
  Filled 2012-02-12: qty 1

## 2012-02-12 MED ORDER — ACETAMINOPHEN 325 MG PO TABS: 650.0000 mg | ORAL_TABLET | Freq: Four times a day (QID) | ORAL | Status: DC | PRN

## 2012-02-12 MED ORDER — ONDANSETRON HCL 4 MG/2ML IJ SOLN: 4.0000 mg | Freq: Four times a day (QID) | INTRAMUSCULAR | Status: DC | PRN

## 2012-02-12 MED ORDER — SODIUM CHLORIDE 0.9 % IJ SOLN: 3.0000 mL | Freq: Two times a day (BID) | INTRAMUSCULAR | Status: DC

## 2012-02-12 MED ORDER — SODIUM CHLORIDE 0.9 % IJ SOLN
3.0000 mL | Freq: Two times a day (BID) | INTRAMUSCULAR | Status: DC
Start: 2012-02-12 — End: 2012-02-14
  Administered 2012-02-12 – 2012-02-14 (×4): 3 mL via INTRAVENOUS

## 2012-02-12 MED ORDER — SODIUM CHLORIDE 0.9 % IV SOLN
INTRAVENOUS | Status: AC
  Administered 2012-02-12: 100 mL/h via INTRAVENOUS

## 2012-02-12 NOTE — Consult Note (Signed)
Admit date: 02/11/2012 Referring Physician  : [facility] Primary Physician :Ignacia Bayley [facility], Bennie Pierini, NP Primary Cardiologist  : Gwynneth Albright, M.D. Reason for Consultation : Prolonged chest pain  ASSESSMENT: 1. Unstable angina pectoris/new onset  2. History of bilateral carotid endarterectomy  3. 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 Long-standing diabetes mellitus  5. Prior smoker  6. Hyperlipidemia  7. Mild aortic stenosis  PLAN:  1. The patient has been counseled to to undergo diagnostic coronary angiography and possible percutaneous coronary intervention. He is been appraised of the nature of the procedure and its risks including stroke, death, myocardial infarction, bleeding, allergy, renal failure, limb ischemia, among others. He has had prior catheterizations and is aware of these potential risks. He agrees to proceed.  2. Increase Lovenox to the ACS dose.  3. Statin therapy, long-acting nitrate, and aspirin.   HPI: The patient is a 87 and retired. He was doing manual labor in his yard yesterday when he suddenly developed a pressure across her precordium that radiated into his neck and jaws. The discomfort was associated with diaphoresis and dyspnea. It lasted nearly 2 hours before gradually subsiding. Because of the continuing nature of the discomfort the patient came to the emergency room by car. As he was arriving he states that the discomfort began to slowly improve. He was admitted to the hospital to rule out myocardial infarction. He's had no recurrence of chest discomfort since admission. He had prior catheterization greater than 10 years ago was found to have nonobstructive coronary disease.   PMH:   Past Medical History  Diagnosis Date  . Pneumonia     x2  . High cholesterol   . Diabetes mellitus   . CAD (coronary artery disease)      PSH:   Past Surgical History  Procedure  Date  . Carotid endarterectomy     Allergies:  Tetracyclines & related Prior to Admit Meds:   Prescriptions prior to admission  Medication Sig Dispense Refill  . albuterol-ipratropium (COMBIVENT) 18-103 MCG/ACT inhaler Inhale 1 puff into the lungs every 6 (six) hours as needed.        Marland Kitchen aspirin 81 MG tablet Take 81 mg by mouth daily.        . Cholecalciferol (VITAMIN D) 2000 UNITS tablet Take 2,000 Units by mouth daily.       . Fluticasone-Salmeterol (ADVAIR DISKUS) 100-50 MCG/DOSE AEPB Inhale 1 puff into the lungs every 12 (twelve) hours.        Marland Kitchen lisinopril (PRINIVIL,ZESTRIL) 10 MG tablet Take 10 mg by mouth daily.        . metFORMIN (GLUCOPHAGE) 500 MG tablet Take 250 mg by mouth 2 (two) times daily with a meal.       . sildenafil (VIAGRA) 50 MG tablet Take 50 mg by mouth daily as needed.       . simvastatin (ZOCOR) 40 MG tablet Take 40 mg by mouth every evening.      . travoprost, benzalkonium, (TRAVATAN) 0.004 % ophthalmic solution 1 drop at bedtime.       Fam HX:    Family History  Problem Relation Age of Onset  . Diabetes Mother   . Stroke Mother   . Hypertension Mother   . Hyperlipidemia Mother   . Cancer Father   . Hypertension Brother   . Cancer Other    Social HX:    History   Social History  .  Marital Status: Married    Spouse Name: N/A    Number of Children: N/A  . Years of Education: N/A   Occupational History  . Not on file.   Social History Main Topics  . Smoking status: Former Smoker    Types: Cigarettes  . Smokeless tobacco: Not on file  . Alcohol Use: No  . Drug Use: No  . Sexually Active: Not on file   Other Topics Concern  . Not on file   Social History Narrative  . No narrative on file     Review of Systems: He has difficulty walking distances to 2 bilateral hip discomfort that goes away with rest. He denies any recent neurological complaints. He does have a prior history of stroke. He has no specific pulmonary problem. He does have  history of a heart murmur  Physical Exam: Blood pressure 124/61, pulse 81, temperature 97.6 F (36.4 C), temperature source Oral, resp. rate 16, height 5\' 3"  (1.6 m), weight 76.386 kg (168 lb 6.4 oz), SpO2 97.00%. Weight change:     Bilateral carotid bruits are heard. Evidence of prior bilateral carotid endarterectomies noted. Carotid upstroke is 2+ bilaterally.  Lungs clear auscultation and percussion.  Cardiac exam reveals a grade 2/6 systolic murmur right upper sternal border. An S4 gallop is also audible.  Abdomen soft. Liver and spleen not palpable. Bilateral femoral bruits are heard. No abdominal bruits are heard.  Extremities reveal no edema. Pedal pulses are difficult to palpate. Bilateral femoral bruits are heard. I am unable to palpate a right femoral pulse but there is a 1+ left femoral pulse.  Patient's ability to carry on a conversation is somewhat impaired with his speech being slow but he articulates quite well. Labs:   Lab Results  Component Value Date   WBC 9.4 02/12/2012   HGB 12.7* 02/12/2012   HCT 36.4* 02/12/2012   MCV 83.7 02/12/2012   PLT 156 02/12/2012    Lab 02/12/12 0405 02/11/12 2050  NA 138 --  K 4.3 --  CL 104 --  CO2 23 --  BUN 21 --  CREATININE 1.01 --  CALCIUM 8.8 --  PROT -- 6.6  BILITOT -- 1.0  ALKPHOS -- 52  ALT -- 17  AST -- 20  GLUCOSE 162* --   No results found for this basename: PTT   Lab Results  Component Value Date   INR 1.03 02/11/2012   Lab Results  Component Value Date   CKTOTAL 234* 02/12/2012   CKMB 5.6* 02/12/2012   TROPONINI <0.30 02/12/2012     Lab Results  Component Value Date   CHOL 125 02/12/2012   Lab Results  Component Value Date   HDL 47 02/12/2012   Lab Results  Component Value Date   LDLCALC 67 02/12/2012   Lab Results  Component Value Date   TRIG 55 02/12/2012   Lab Results  Component Value Date   CHOLHDL 2.7 02/12/2012   No results found for this basename: LDLDIRECT      Radiology:  Dg Chest 2  View  02/11/2012  *RADIOLOGY REPORT*  Clinical Data: Shortness of breath, heartburn, and central chest pain.  CHEST - 2 VIEW  Comparison: 01/01/2004  Findings: Normal heart size and pulmonary vascularity.  Calcified granulomas in the right midlung.  Mild central peribronchial thickening may suggest chronic bronchitis.  No focal airspace consolidation in the lungs.  No blunting of costophrenic angles. No pneumothorax.  Calcification of the aorta.  Degenerative changes in the spine.  Postoperative changes in the base of the neck.  No significant changes since the previous study.  IMPRESSION: Chronic bronchitic changes.  No evidence of active pulmonary disease.  Original Report Authenticated By: Marlon Pel, M.D.    Echocardiogram: Study Conclusions  - Left ventricle: The cavity size was normal. Wall thickness was increased in a pattern of mild LVH. Systolic function was normal. The estimated ejection fraction was in the range of 55% to 60%. - Aortic valve: There was mild stenosis. Valve area: 0.83cm^2(VTI). Valve area: 0.84cm^2 (Vmax). - Atrial septum: There was increased thickness of the septum, consistent with lipomatous hypertrophy. No defect or patent foramen ovale was identified.   EKG:  Normal    CAINE, BARFIELD 02/12/2012 8:52 PM

## 2012-02-12 NOTE — Progress Notes (Signed)
Patient ID: Dean Harris, male   DOB: [birth year], 71 y.o.   MRN: [id number]  Subjective: No events overnight. Patient denies chest pain, shortness of breath, abdominal pain.   Objective: Vital signs in last 24 hours:  Filed Vitals:   02/12/12 0054 02/12/12 0459 02/12/12 0944  BP: 142/78 142/72 144/73  Pulse: 80 71 74  Temp: 97.6 F (36.4 C) 97.6 F (36.4 C)   Resp: 20 20   Height: 5\' 3"  (1.6 m)    Weight: 76.386 kg (168 lb)    SpO2: 98% 97%    Intake/Output from previous day:  Intake/Output Summary (Last 24 hours) at 02/12/12 1138 Last data filed at 02/12/12 0702  Gross per 24 hour  Intake    700 ml  Output    700 ml  Net      0 ml   Physical Exam: General: Alert, awake, oriented x3, in no acute distress. HEENT: No bruits, no goiter. Moist mucous membranes, no scleral icterus, no conjunctival pallor. Heart: Regular rate and rhythm, S1/S2 +, SEM 2/6, rubs, gallops. Lungs: Clear to auscultation bilaterally. No wheezing, no rhonchi, no rales.  Abdomen: Soft, nontender, nondistended, positive bowel sounds. Extremities: No clubbing or cyanosis, no pitting edema,  positive pedal pulses. Neuro: Grossly nonfocal.  Lab Results:  Lab 02/12/12 0405 02/11/12 2050  WBC 9.4 11.2*  HGB 12.7* 13.2  HCT 36.4* 37.6*  PLT 156 167   Lab 02/12/12 0405 02/11/12 2050  NA 138 134*  K 4.3 4.2  CL 104 99  CO2 23 22  GLUCOSE 162* 235*  BUN 21 26*  CREATININE 1.01 1.06  CALCIUM 8.8 9.2   Lab 02/11/12 2050  INR 1.03  PROTIME --   Lab 02/12/12 1015 02/12/12 0405 02/11/12 2240  CKMB 5.6* 5.5* 5.7*  TROPONINI <0.30 <0.30 <0.30  MYOGLOBIN -- -- --   Studies/Results:  Dg Chest 2 View 02/11/2012    IMPRESSION:  Chronic bronchitic changes.  No evidence of active pulmonary disease.   Medications: Scheduled Meds:  . aspirin EC  325 mg Daily  . enoxaparin  40 mg Q24H  . Fluticasone-Salmeterol  1 puff Q12H  . insulin aspart  0-9 Units TID WC  . lisinopril  10 mg Daily  .  simvastatin  40 mg QPM   PRN Meds:.  Acetaminophen  nitroglycerin  Ondansetron  senna-docusate   zolpidem  Assessment/Plan:  Principal Problem:  *Unstable angina - pt denies any chest pain this morning but certainly has multiple risk factors including hypertension, diabetes, smoking history - I have spoke with cardiologist on call and plan is to proceed with myocardial perfusion study for further evaluation - Will continue aspirin as noted above - Will continue blood pressure control with lisinopril and cholesterol control with statin  Active Problems:  Diabetes mellitus, complications neuropathy and gastroparesis - Diabetes appears to be well controlled with A1C 5.8 - will continue SSI for now and readjust the medication regimen as indicated   Hyperlipidemia - FLP is within normal limits - will continue statin    Chronic respiratory failure secondary to COPD (chronic obstructive pulmonary disease) - Appears to be stable clinically at this point but per CXR chronic bronchitic changes noted   Peripheral neuropathy - Secondary to diabetes as noted above   EDUCATION - test results and diagnostic studies were discussed with patient  - patient verbalized the understanding - questions were answered at the bedside and contact information was provided for additional questions or concerns   LOS: 1  day   Debbora Presto 02/12/2012, 11:38 AM  [facility] Pager: [phone]

## 2012-02-12 NOTE — Progress Notes (Signed)
*  PRELIMINARY RESULTS* Echocardiogram 2D Echocardiogram has been performed.  Dean Harris [facility] 02/12/2012, 11:52 AM

## 2012-02-12 NOTE — Progress Notes (Signed)
   CARE MANAGEMENT NOTE 02/12/2012  Patient:  Dean Harris, Dean Harris   Account Number:  [id number]  Date Initiated:  02/12/2012  Documentation initiated by:  Jiles Crocker  Subjective/Objective Assessment:   ADMITTED WITH CHEST PAIN     Action/Plan:   PCP: Rudi Heap, MD; LIVES AT HOME WITH SPOUSE   Anticipated DC Date:  02/13/2012   Anticipated DC Plan:  HOME/SELF CARE      DC Planning Services  CM consult            Status of service:  In process, will continue to follow Medicare Important Message given?  NA - LOS <3 / Initial given by admissions (If response is "NO", the following Medicare IM given date fields will be blank)  Per UR Regulation:  Reviewed for med. necessity/level of care/duration of stay  Comments:  5/30/2013[facility] RN, BSN, MHA

## 2012-02-12 NOTE — Progress Notes (Signed)
ANTICOAGULATION CONSULT NOTE - Initial Consult  Pharmacy Consult for Lovenox Indication: unstable angina  Allergies  Allergen Reactions  . Tetracyclines & Related     Generic only    Patient Measurements: Height: 5\' 3"  (160 cm) Weight: 168 lb 6.4 oz (76.386 kg) IBW/kg (Calculated) : 56.9   Vital Signs: Temp: 97.6 F (36.4 C) (05/30 1420) Temp src: Oral (05/30 1420) BP: 124/61 mmHg (05/30 1420) Pulse Rate: 81  (05/30 1420)  Labs:  Basename 02/12/12 1015 02/12/12 0405 02/11/12 2240 02/11/12 2050  HGB -- 12.7* -- 13.2  HCT -- 36.4* -- 37.6*  PLT -- 156 -- 167  APTT -- -- -- 31  LABPROT -- -- -- 13.7  INR -- -- -- 1.03  HEPARINUNFRC -- -- -- --  CREATININE -- 1.01 -- 1.06  CKTOTAL 234* 211 201 --  CKMB 5.6* 5.5* 5.7* --  TROPONINI <0.30 <0.30 <0.30 --    Estimated Creatinine Clearance: 64.1 ml/min (by C-G formula based on Cr of 1.01).   Medical History: Past Medical History  Diagnosis Date  . Pneumonia     x2  . High cholesterol   . Diabetes mellitus   . CAD (coronary artery disease)     Medications:  Scheduled:    . sodium chloride   Intravenous STAT  . aspirin EC  325 mg Oral Daily  . cholecalciferol  2,000 Units Oral Daily  . diazepam  5 mg Oral On Call  . Fluticasone-Salmeterol  1 puff Inhalation Q12H  . insulin aspart  0-9 Units Subcutaneous TID WC  . lisinopril  10 mg Oral Daily  . simvastatin  40 mg Oral QPM  . sodium chloride  3 mL Intravenous Q12H  . sodium chloride  3 mL Intravenous Q12H  . sodium chloride  3 mL Intravenous Q12H  . Travoprost (BAK Free)  1 drop Both Eyes QHS  . DISCONTD: sodium chloride   Intravenous Once  . DISCONTD: aspirin EC  325 mg Oral Once  . DISCONTD: aspirin EC  325 mg Oral Daily  . DISCONTD: enoxaparin  40 mg Subcutaneous Q24H   Infusions:    . sodium chloride      Assessment: 71 yo male with new onset unstable angina/ACS to increase Lovenox to full dose per cards recommendations. Patient to have cath  tomorrow  Goal of Therapy:  Heparin level 0.6-1.2 units/ml Monitor platelets by anticoagulation protocol: Yes   Plan:  1. Start Lovenox 1mg /kg (80mg ) SQ, but per Dr. Katrinka Blazing of [facility] will hold the AM dose of the Lovenox as he is scheduled for a cath so will only order 1 dose tonight 2. Will follow up tomorrow to see when cath is scheduled and potential need for another Lovenox dose if cath is delayed in the afternoon   Hessie Knows, PharmD, BCPS Pager [phone] 02/12/2012 9:53 PM

## 2012-02-13 ENCOUNTER — Other Ambulatory Visit (HOSPITAL_COMMUNITY): Payer: Medicare Other

## 2012-02-13 ENCOUNTER — Encounter (HOSPITAL_COMMUNITY)
Admission: EM | Disposition: A | Discharge status: Home / Self Care | Payer: Self-pay | Source: Home / Self Care | Attending: Internal Medicine

## 2012-02-13 ENCOUNTER — Other Ambulatory Visit: Payer: Self-pay

## 2012-02-13 DIAGNOSIS — E1165 Type 2 diabetes mellitus with hyperglycemia: Secondary | ICD-10-CM

## 2012-02-13 DIAGNOSIS — I200 Unstable angina: Secondary | ICD-10-CM

## 2012-02-13 DIAGNOSIS — E782 Mixed hyperlipidemia: Secondary | ICD-10-CM

## 2012-02-13 HISTORY — PX: LEFT HEART CATHETERIZATION WITH CORONARY ANGIOGRAM: SHX5451

## 2012-02-13 LAB — BASIC METABOLIC PANEL
Calcium: 9 mg/dL (ref 8.4–10.5)
GFR calc Af Amer: 76 mL/min — ABNORMAL LOW (ref >90)
GFR calc non Af Amer: 66 mL/min — ABNORMAL LOW (ref >90)
Glucose, Bld: 155 mg/dL — ABNORMAL HIGH (ref 70–99)
Potassium: 4.3 mEq/L (ref 3.5–5.1)
Sodium: 136 mEq/L (ref 135–145)

## 2012-02-13 LAB — CBC
Hemoglobin: 13 g/dL (ref 13.0–17.0)
MCH: 29.4 pg (ref 26.0–34.0)
MCHC: 34.4 g/dL (ref 30.0–36.0)
RDW: 12.9 % (ref 11.5–15.5)

## 2012-02-13 LAB — GLUCOSE, CAPILLARY
Glucose-Capillary: 127 mg/dL — ABNORMAL HIGH (ref 70–99)
Glucose-Capillary: 124 mg/dL — ABNORMAL HIGH (ref 70–99)
Glucose-Capillary: 291 mg/dL — ABNORMAL HIGH (ref 70–99)

## 2012-02-13 SURGERY — LEFT HEART CATHETERIZATION WITH CORONARY ANGIOGRAM

## 2012-02-13 MED ORDER — FENTANYL CITRATE 0.05 MG/ML IJ SOLN
INTRAMUSCULAR | Status: AC
  Filled 2012-02-13: qty 2

## 2012-02-13 MED ORDER — ACETAMINOPHEN 325 MG PO TABS: 650.0000 mg | ORAL_TABLET | ORAL | Status: DC | PRN

## 2012-02-13 MED ORDER — ASPIRIN 325 MG PO TBEC: 325.0000 mg | DELAYED_RELEASE_TABLET | Freq: Every day | ORAL | Status: AC

## 2012-02-13 MED ORDER — ENOXAPARIN SODIUM 80 MG/0.8ML ~~LOC~~ SOLN
1.0000 mg/kg | Freq: Once | SUBCUTANEOUS | Status: AC
  Administered 2012-02-13: 75 mg via SUBCUTANEOUS
  Filled 2012-02-13: qty 0.8

## 2012-02-13 MED ORDER — INSULIN ASPART 100 UNIT/ML ~~LOC~~ SOLN
5.0000 [IU] | Freq: Once | SUBCUTANEOUS | Status: AC
  Administered 2012-02-13: 5 [IU] via SUBCUTANEOUS

## 2012-02-13 MED ORDER — ONDANSETRON HCL 4 MG/2ML IJ SOLN: 4.0000 mg | Freq: Four times a day (QID) | INTRAMUSCULAR | Status: DC | PRN

## 2012-02-13 MED ORDER — NITROGLYCERIN 0.2 MG/ML ON CALL CATH LAB
INTRAVENOUS | Status: AC
  Filled 2012-02-13: qty 1

## 2012-02-13 MED ORDER — MIDAZOLAM HCL 2 MG/2ML IJ SOLN
INTRAMUSCULAR | Status: AC
  Filled 2012-02-13: qty 2

## 2012-02-13 MED ORDER — HEPARIN SODIUM (PORCINE) 1000 UNIT/ML IJ SOLN
INTRAMUSCULAR | Status: AC
  Filled 2012-02-13: qty 1

## 2012-02-13 MED ORDER — ISOSORBIDE MONONITRATE ER 30 MG PO TB24
30.0000 mg | ORAL_TABLET | ORAL | Status: AC
  Administered 2012-02-13: 30 mg via ORAL
  Filled 2012-02-13: qty 1

## 2012-02-13 MED ORDER — LIDOCAINE HCL (PF) 1 % IJ SOLN
INTRAMUSCULAR | Status: AC
  Filled 2012-02-13: qty 30

## 2012-02-13 MED ORDER — ISOSORBIDE MONONITRATE ER 30 MG PO TB24: 30.0000 mg | ORAL_TABLET | Freq: Every day | ORAL | Status: DC

## 2012-02-13 MED ORDER — HEPARIN (PORCINE) IN NACL 2-0.9 UNIT/ML-% IJ SOLN
INTRAMUSCULAR | Status: AC
  Filled 2012-02-13: qty 2000

## 2012-02-13 MED ORDER — SODIUM CHLORIDE 0.9 % IV SOLN
1.0000 mL/kg/h | INTRAVENOUS | Status: AC
  Administered 2012-02-13: 1 mL/kg/h via INTRAVENOUS

## 2012-02-13 MED ORDER — ISOSORBIDE MONONITRATE ER 30 MG PO TB24
30.0000 mg | ORAL_TABLET | Freq: Every day | ORAL | Status: DC
  Administered 2012-02-14: 30 mg via ORAL
  Filled 2012-02-13 (×2): qty 1

## 2012-02-13 NOTE — Interval H&P Note (Signed)
History and Physical Interval Note:  02/13/2012 1:46 PM  Dean Harris  has presented today for surgery, with the diagnosis of cp  The various methods of treatment have been discussed with the patient and family. After consideration of risks, benefits and other options for treatment, the patient has consented to  Procedure(s) (LRB): LEFT HEART CATHETERIZATION WITH CORONARY ANGIOGRAM (N/A) as a surgical intervention .  The patients' history has been reviewed, patient examined, no change in status, stable for surgery.  I have reviewed the patients' chart and labs.  Questions were answered to the patient's satisfaction.     Shakeia Krus  I spoke with [name] earlier this morning (8am) about heart catheterization. Dr. Katrinka Blazing had called him on the phone and told him that he would be having the procedure done with me. I answered all of his questions including risks of stroke, heart attack, death, bleeding, limb impairment. He does have palpable femoral pulses but left is greater than right. Hopefully radial artery approach we'll work. He did state that 10 years ago when he had his prior cardiac catheterization that it was quite painful for him. We'll be very careful to give him adequate sedation. I will discuss findings with Dr. Katrinka Blazing who will be available for interventional backup.

## 2012-02-13 NOTE — Progress Notes (Signed)
Subjective:  Cardiac catheterization has been discussed with him. Dr. Katrinka Blazing has called him on the phone this morning. He currently is not reporting any chest discomfort, shortness of breath.   Objective:  Vital Signs in the last 24 hours: Temp:  [97.6 F (36.4 C)-97.8 F (36.6 C)] 97.8 F (36.6 C) (05/31 0711) Pulse Rate:  [68-81] 68  (05/31 0711) Resp:  [16-20] 20  (05/31 0711) BP: (124-147)/(61-74) 146/74 mmHg (05/31 0711) SpO2:  [96 %-99 %] 97 % (05/31 0805) Weight:  [74.6 kg (164 lb 7.4 oz)] 74.6 kg (164 lb 7.4 oz) (05/31 0711)  Intake/Output from previous day: 05/30 0701 - 05/31 0700 In: 1300.7 [P.O.:360; I.V.:940.7] Out: 225 [Urine:225]   Physical Exam: General: Well developed, well nourished, in no acute distress. Mildly anxious Head:  Normocephalic and atraumatic. Lungs: Clear to auscultation and percussion. Heart: Normal S1 and S2. 2/6 systolic murmur right upper sternal border, rubs or gallops.  Pulses: Femoral pulses palpable, left greater than right. Abdomen: soft, non-tender, positive bowel sounds. Extremities: No clubbing or cyanosis. No edema. Neurologic: Alert and oriented x 3.    Lab Results:  Basename 02/13/12 0348 02/12/12 0405  WBC 7.8 9.4  HGB 13.0 12.7*  PLT 133* 156    Basename 02/13/12 0348 02/12/12 0405  NA 136 138  K 4.3 4.3  CL 101 104  CO2 25 23  GLUCOSE 155* 162*  BUN 22 21  CREATININE 1.12 1.01    Basename 02/12/12 1015 02/12/12 0405  TROPONINI <0.30 <0.30   Hepatic Function Panel  Basename 02/11/12 2050  PROT 6.6  ALBUMIN 4.2  AST 20  ALT 17  ALKPHOS 52  BILITOT 1.0  BILIDIR --  IBILI --    Basename 02/12/12 0405  CHOL 125   No results found for this basename: PROTIME in the last 72 hours  Imaging: Dg Chest 2 View  02/11/2012  *RADIOLOGY REPORT*  Clinical Data: Shortness of breath, heartburn, and central chest pain.  CHEST - 2 VIEW  Comparison: 01/01/2004  Findings: Normal heart size and pulmonary vascularity.   Calcified granulomas in the right midlung.  Mild central peribronchial thickening may suggest chronic bronchitis.  No focal airspace consolidation in the lungs.  No blunting of costophrenic angles. No pneumothorax.  Calcification of the aorta.  Degenerative changes in the spine.  Postoperative changes in the base of the neck.  No significant changes since the previous study.  IMPRESSION: Chronic bronchitic changes.  No evidence of active pulmonary disease.  Original Report Authenticated By: Marlon Pel, M.D.   Personally viewed.   Telemetry: Normal rhythm, no adverse Personally viewed.   EKG:  Normal rhythm, no ST segment changes  Cardiac Studies:  Normal EF, mild AS - needed gradient 13 mm mercury, peak velocity 2.3 m/s  Assessment/Plan:  Principal Problem:  *Unstable angina Active Problems:  Sleep apnea  Gout  Diabetes mellitus  Hyperlipidemia  COPD (chronic obstructive pulmonary disease)  ED (erectile dysfunction)  Peripheral neuropathy  High cholesterol  -Proceeding with cardiac catheterization today, risk and benefits including stroke heart attack death renal impairment arterial damage bleeding loss of limb have been explained. -Continue with simvastatin for hyperlipidemia management -Continue with lisinopril for antihypertensive -Aspirin. -Has peripheral vascular disease, carotid artery disease. -Femoral arteries are palpable, left greater than right  Derel Mcglasson 02/13/2012, 1:54 PM

## 2012-02-13 NOTE — Progress Notes (Signed)
Patient Name: Dean Harris Date of Encounter: 02/13/2012    SUBJECTIVE: There is no chest discomfort overnight.  TELEMETRY:  Normal sinus rhythm: Filed Vitals:   02/12/12 1420 02/12/12 2115 02/12/12 2215 02/13/12 0711  BP: 124/61  147/72 146/74  Pulse: 81  72 68  Temp: 97.6 F (36.4 C)  97.8 F (36.6 C) 97.8 F (36.6 C)  TempSrc: Oral  Oral Oral  Resp: 16  19 20  Height:      Weight:    74.6 kg (164 lb 7.4 oz)  SpO2: 97% 96% 99% 96%    Intake/Output Summary (Last 24 hours) at 02/13/12 0757 Last data filed at 02/13/12 0700  Gross per 24 hour  Intake 600.66 ml  Output    225 ml  Net 375.66 ml    LABS: Basic Metabolic Panel:  Basename 02/13/12 0348 02/12/12 0405  NA 136 138  K 4.3 4.3  CL 101 104  CO2 25 23  GLUCOSE 155* 162*  BUN 22 21  CREATININE 1.12 1.01  CALCIUM 9.0 8.8  MG -- --  PHOS -- --   CBC:  Basename 02/13/12 0348 02/12/12 0405 02/11/12 2050  WBC 7.8 9.4 --  NEUTROABS -- -- 9.8*  HGB 13.0 12.7* --  HCT 37.8* 36.4* --  MCV 85.5 83.7 --  PLT 133* 156 --   Cardiac Enzymes:  Basename 02/12/12 1015 02/12/12 0405 02/11/12 2240  CKTOTAL 234* 211 201  CKMB 5.6* 5.5* 5.7*  CKMBINDEX -- -- --  TROPONINI <0.30 <0.30 <0.30   BNP: No components found with this basename: POCBNP:3 Hemoglobin A1C: No results found for this basename: HGBA1C in the last 72 hours Fasting Lipid Panel:  Basename 02/12/12 0405  CHOL 125  HDL 47  LDLCALC 67  TRIG 55  CHOLHDL 2.7  LDLDIRECT --    Radiology/Studies:  No new data  Physical Exam: Blood pressure 146/74, pulse 68, temperature 97.8 F (36.6 C), temperature source Oral, resp. rate 20, height 5' 3" (1.6 m), weight 74.6 kg (164 lb 7.4 oz), SpO2 96.00%. Weight change:    Not reexamined  ASSESSMENT:  1. New onset, unstable angina in a patient with evidence of widespread vascular disease including bilateral femoral bruits and decreased right femoral pulses, and bilateral carotid endarterectomies. The  patient has mild elevation of CK-MB but normal troponin values.  2. Hypertension  Plan:  1. I rediscussed indication for the procedure with the patient. We rediscussed the risk involved. I discussed the case with Dr. Skains will perform the procedure likely from the radial approach.  2. Clear liquid breakfast with anticipated catheterization later this afternoon.  Signed, Agne III,Serene Kopf W 02/13/2012, 7:57 AM  

## 2012-02-13 NOTE — Progress Notes (Signed)
Patient ID: Dean Harris, male   DOB: November 22, 1940, 71 y.o.   MRN: [id number]  Subjective: No events overnight. Patient denies chest pain, shortness of breath, abdominal pain.   Objective:  Vital signs in last 24 hours:  Filed Vitals:   02/12/12 2115 02/12/12 2215 02/13/12 0711 02/13/12 0805  BP:  147/72 146/74   Pulse:  72 68   Temp:  97.8 F (36.6 C) 97.8 F (36.6 C)   TempSrc:  Oral Oral   Resp:  19 20   Height:      Weight:   74.6 kg (164 lb 7.4 oz)   SpO2: 96% 99% 96% 97%    Intake/Output from previous day:   Intake/Output Summary (Last 24 hours) at 02/13/12 1413 Last data filed at 02/13/12 1026  Gross per 24 hour  Intake 1320.66 ml  Output    225 ml  Net 1095.66 ml    Physical Exam: General: Alert, awake, oriented x3, in no acute distress. HEENT: No bruits, no goiter. Moist mucous membranes, no scleral icterus, no conjunctival pallor. Heart: Regular rate and rhythm, S1/S2 +, SEM 2/6, rubs, gallops. Lungs: Clear to auscultation bilaterally. No wheezing, no rhonchi, no rales.  Abdomen: Soft, nontender, nondistended, positive bowel sounds. Extremities: No clubbing or cyanosis, no pitting edema,  positive pedal pulses. Neuro: Grossly nonfocal.  Lab Results:  Lab 02/13/12 0348 02/12/12 0405 02/11/12 2050  WBC 7.8 9.4 11.2*  HGB 13.0 12.7* 13.2  HCT 37.8* 36.4* 37.6*  PLT 133* 156 167    Lab 02/13/12 0348 02/12/12 0405 02/11/12 2050  NA 136 138 134*  K 4.3 4.3 4.2  CL 101 104 99  CO2 25 23 22   GLUCOSE 155* 162* 235*  BUN 22 21 26*  CREATININE 1.12 1.01 1.06  CALCIUM 9.0 8.8 9.2    Lab 02/11/12 2050  INR 1.03  PROTIME --   Cardiac markers:  Lab 02/12/12 1015 02/12/12 0405 02/11/12 2240  CKMB 5.6* 5.5* 5.7*  TROPONINI <0.30 <0.30 <0.30  MYOGLOBIN -- -- --    Studies/Results:  Dg Chest 2 View 02/11/2012   IMPRESSION:  Chronic bronchitic changes.  No evidence of active pulmonary disease.    Medications: Scheduled Meds:   . aspirin EC  325  mg Oral Daily  . cholecalciferol  2,000 Units Oral Daily  . diazepam  5 mg Oral On Call  . enoxaparin (LOVENOX) injection  1 mg/kg Subcutaneous Once  . enoxaparin (LOVENOX) injection  80 mg Subcutaneous NOW  . fentaNYL      . Fluticasone-Salmeterol  1 puff Inhalation Q12H  . heparin      . insulin aspart  0-9 Units Subcutaneous TID WC  . lidocaine      . lisinopril  10 mg Oral Daily  . midazolam      . nitroGLYCERIN      . simvastatin  40 mg Oral QPM  . sodium chloride  3 mL Intravenous Q12H  . sodium chloride  3 mL Intravenous Q12H  . sodium chloride  3 mL Intravenous Q12H  . Travoprost (BAK Free)  1 drop Both Eyes QHS  . DISCONTD: enoxaparin  40 mg Subcutaneous Q24H   Continuous Infusions:   . sodium chloride 1 mL/kg/hr (02/13/12 0351)   PRN Meds:.sodium chloride, acetaminophen, acetaminophen, nitroGLYCERIN, ondansetron (ZOFRAN) IV, ondansetron, senna-docusate, sodium chloride, zolpidem  Assessment/Plan:  Principal Problem:  *Unstable angina  - pt denies any chest pain this morning but certainly has multiple risk factors including hypertension, diabetes, smoking history  -  plan is to proceed with cardiac cath this afternoon for further evaluation - Will continue blood pressure control with lisinopril and cholesterol control with statin  - appreciate cardiology input  Active Problems:  Diabetes mellitus, complications neuropathy and gastroparesis  - Diabetes appears to be well controlled with A1C 5.8  - will continue SSI for now and readjust the medication regimen as indicated   Hyperlipidemia  - FLP is within normal limits  - will continue statin   Chronic respiratory failure secondary to COPD (chronic obstructive pulmonary disease)  - Appears to be stable clinically at this point but per CXR chronic bronchitic changes noted   Peripheral neuropathy  - Secondary to diabetes as noted above   EDUCATION  - test results and diagnostic studies were discussed with  patient  - patient verbalized the understanding  - questions were answered at the bedside and contact information was provided for additional questions or concerns    LOS: 2 days   MAGICK-Deva Ron 02/13/2012, 2:13 PM  [facility] Pager: [phone]

## 2012-02-13 NOTE — CV Procedure (Signed)
PROCEDURE:  Left heart catheterization with selective coronary angiography, left ventriculogram via the radial artery approach.  INDICATIONS:  71 year old male with chest discomfort at rest with peripheral vascular disease, normal troponin/EKG. Symptoms concerning for unstable angina.  The risks, benefits, and details of the procedure were explained to the patient, including possibilities of stroke, heart attack, death, renal impairment, arterial damage, bleeding.  The patient verbalized understanding and wanted to proceed.  Informed written consent was obtained.  PROCEDURE TECHNIQUE:  Allen's test was performed pre-and post procedure and was normal. The right radial artery site was prepped and draped in a sterile fashion. One percent lidocaine was used for local anesthesia. Using the modified Seldinger technique a 5 French hydrophilic sheath was inserted into the radial artery without difficulty. 3 mg of verapamil was administered via the sheath. A Judkins right #4 catheter with the guidance of a Versicore wire was placed in the right coronary cusp and selectively cannulated the right coronary artery. After traversing the aortic arch, 4000 units of heparin IV was administered. A Judkins left #3.5 catheter was used to selectively cannulate the left main artery. Multiple views with hand injection of Omnipaque were obtained. Catheter a pigtail catheter was used to cross into the left ventricle, hemodynamics were obtained, and a left ventriculogram was performed in the RAO position with power injection. Following the procedure, sheath was removed, patient was hemodynamically stable, hemostasis was maintained with a Terumo T band.   CONTRAST:  Total of 90 ml.    FLOUROSCOPY TIME: 3.1 min.  COMPLICATIONS:  None.    HEMODYNAMICS:  Aortic pressure was 133/14mmHg; LV systolic pressure was ; LVEDP .  There was minimal gradient (peak to peak ) between the left ventricle and aorta.     ANGIOGRAPHIC DATA:    Left main: Minimal tapering distally, appears normal in caudal views, bifurcates into LAD as well as circumflex. No overall coronary artery disease present.  Left anterior descending (LAD): At the bifurcation of the second diagonal , the LAD takes a sharp bend toward the apex. At this juncture, the caliber of the vessel decreases at approximately 50% stenosis. At the apex of the LAD, the caliber of the vessel sharply decreases however this vessel size is too small for percutaneous intervention.  Circumflex artery (CIRC): There is moderate disease in the first obtuse marginal branch of approximately 50% proximally, minor haziness. Otherwise no significant disease.  Right coronary artery (RCA): There is a long tubular lesion in the mid RCA approximately 50% which responded slightly to nitroglycerin. During injections of the right coronary artery, ST segment depressions were noted.  LEFT VENTRICULOGRAM:  Left ventricular angiogram was done in the 30 RAO projection and revealed normal left ventricular wall motion and systolic function with an estimated ejection fraction of 65%.   IMPRESSIONS:  Moderate diffuse coronary artery disease-approximately 50% mid LAD (sharp bend to vessel but no bridging), 50% proximal first obtuse marginal branch, 50% tubular mid RCA. Normal left ventricular systolic function.  LVEDP 25 mmHg, diastolic dysfunction.  Ejection fraction 65%. Minimal aortic valve gradient-no significant aortic stenosis.  RECOMMENDATION:  I have discussed films with Dr. Verdis Prime, his consulting cardiologist and we will continue with medical management. Aspirin, statin, isosorbide mononitrate 30 mg once a day. If he continues to have ongoing discomfort, one may consider nuclear study to further evaluate for ischemia in the moderate CAD lesions described above. Nitroglycerin should be administered as an outpatient when necessary. He will be seeing Dr. Katrinka Blazing in followup  in  approximately one month.

## 2012-02-13 NOTE — H&P (View-Only) (Signed)
Patient Name: Dean Harris Date of Encounter: 02/13/2012    SUBJECTIVE: There is no chest discomfort overnight.  TELEMETRY:  Normal sinus rhythm: Filed Vitals:   02/12/12 1420 02/12/12 2115 02/12/12 2215 02/13/12 0711  BP: 124/61  147/72 146/74  Pulse: 81  72 68  Temp: 97.6 F (36.4 C)  97.8 F (36.6 C) 97.8 F (36.6 C)  TempSrc: Oral  Oral Oral  Resp: 16  19 20   Height:      Weight:    74.6 kg (164 lb 7.4 oz)  SpO2: 97% 96% 99% 96%    Intake/Output Summary (Last 24 hours) at 02/13/12 0757 Last data filed at 02/13/12 0700  Gross per 24 hour  Intake 600.66 ml  Output    225 ml  Net 375.66 ml    LABS: Basic Metabolic Panel:  Basename 02/13/12 0348 02/12/12 0405  NA 136 138  K 4.3 4.3  CL 101 104  CO2 25 23  GLUCOSE 155* 162*  BUN 22 21  CREATININE 1.12 1.01  CALCIUM 9.0 8.8  MG -- --  PHOS -- --   CBC:  Basename 02/13/12 0348 02/12/12 0405 02/11/12 2050  WBC 7.8 9.4 --  NEUTROABS -- -- 9.8*  HGB 13.0 12.7* --  HCT 37.8* 36.4* --  MCV 85.5 83.7 --  PLT 133* 156 --   Cardiac Enzymes:  Basename 02/12/12 1015 02/12/12 0405 02/11/12 2240  CKTOTAL 234* 211 201  CKMB 5.6* 5.5* 5.7*  CKMBINDEX -- -- --  TROPONINI <0.30 <0.30 <0.30   BNP: No components found with this basename: POCBNP:3 Hemoglobin A1C: No results found for this basename: HGBA1C in the last 72 hours Fasting Lipid Panel:  Basename 02/12/12 0405  CHOL 125  HDL 47  LDLCALC 67  TRIG 55  CHOLHDL 2.7  LDLDIRECT --    Radiology/Studies:  No new data  Physical Exam: Blood pressure 146/74, pulse 68, temperature 97.8 F (36.6 C), temperature source Oral, resp. rate 20, height 5\' 3"  (1.6 m), weight 74.6 kg (164 lb 7.4 oz), SpO2 96.00%. Weight change:    Not reexamined  ASSESSMENT:  1. New onset, unstable angina in a patient with evidence of widespread vascular disease including bilateral femoral bruits and decreased right femoral pulses, and bilateral carotid endarterectomies. The  patient has mild elevation of CK-MB but normal troponin values.  2. Hypertension  Plan:  1. I rediscussed indication for the procedure with the patient. We rediscussed the risk involved. I discussed the case with Dr. Anne Fu will perform the procedure likely from the radial approach.  2. Clear liquid breakfast with anticipated catheterization later this afternoon.  Dean Harris, Dean Harris 02/13/2012, 7:57 AM

## 2012-02-13 NOTE — Progress Notes (Addendum)
ANTICOAGULATION CONSULT NOTE - Follow Up Consult  Pharmacy Consult for Lovenox Indication: Unstable Angina  Allergies  Allergen Reactions  . Tetracyclines & Related     Generic only    Patient Measurements: Height: 5\' 3"  (160 cm) Weight: 164 lb 7.4 oz (74.6 kg) IBW/kg (Calculated) : 56.9   Vital Signs: Temp: 97.8 F (36.6 C) (05/31 0711) Temp src: Oral (05/31 0711) BP: 146/74 mmHg (05/31 0711) Pulse Rate: 68  (05/31 0711)  Labs:  Basename 02/13/12 0348 02/12/12 1015 02/12/12 0405 02/11/12 2240 02/11/12 2050  HGB 13.0 -- 12.7* -- --  HCT 37.8* -- 36.4* -- 37.6*  PLT 133* -- 156 -- 167  APTT -- -- -- -- 31  LABPROT -- -- -- -- 13.7  INR -- -- -- -- 1.03  HEPARINUNFRC -- -- -- -- --  CREATININE 1.12 -- 1.01 -- 1.06  CKTOTAL -- 234* 211 201 --  CKMB -- 5.6* 5.5* 5.7* --  TROPONINI -- <0.30 <0.30 <0.30 --    Estimated Creatinine Clearance: 57.1 ml/min (by C-G formula based on Cr of 1.12).   Medical History: Past Medical History  Diagnosis Date  . Pneumonia     x2  . High cholesterol   . Diabetes mellitus   . CAD (coronary artery disease)     Medications:  Scheduled:     . sodium chloride   Intravenous STAT  . aspirin EC  325 mg Oral Daily  . cholecalciferol  2,000 Units Oral Daily  . diazepam  5 mg Oral On Call  . enoxaparin (LOVENOX) injection  80 mg Subcutaneous NOW  . Fluticasone-Salmeterol  1 puff Inhalation Q12H  . insulin aspart  0-9 Units Subcutaneous TID WC  . lisinopril  10 mg Oral Daily  . simvastatin  40 mg Oral QPM  . sodium chloride  3 mL Intravenous Q12H  . sodium chloride  3 mL Intravenous Q12H  . sodium chloride  3 mL Intravenous Q12H  . Travoprost (BAK Free)  1 drop Both Eyes QHS  . DISCONTD: sodium chloride   Intravenous Once  . DISCONTD: enoxaparin  40 mg Subcutaneous Q24H   Infusions:     . sodium chloride 1 mL/kg/hr (02/13/12 0351)    Assessment: 71 yo male with new onset unstable angina/ACS to increase Lovenox to full  dose per cards recommendations. Patient to have cath today, later in the afternoon. Spoke with Dr. Verdis Prime this am who would like patient to have one more full-dose lovenox injection this am, as cath is not anticipated to occur until after 2pm.  Goal of Therapy:  Heparin level 0.6-1.2 units/ml Monitor platelets by anticoagulation protocol: Yes   Plan:  1. Lovenox 1mg /kg  SQ x1 now 2. Follow up post-cath anticoag plans  Darrol Angel, PharmD Pager: [phone] 02/13/2012 8:26 AM

## 2012-02-13 NOTE — Discharge Instructions (Signed)
Acute Coronary Syndrome    Acute coronary syndrome (ACS) is an urgent problem in which the blood and oxygen supply to the heart is critically deficient. ACS requires hospitalization because one or more coronary arteries may be blocked.  ACS represents a range of conditions including:  · Previous angina that is now unstable, lasts longer, happens at rest, or is more intense.  · A heart attack, with heart muscle cell injury and death.  There are three vital coronary arteries that supply the heart muscle with blood and oxygen so that it can pump blood effectively. If blockages to these arteries develop, blood flow to the heart muscle is reduced. If the heart does not get enough blood, angina may occur as the first warning sign.  SYMPTOMS   · The most common signs of angina include:  · Tightness or squeezing in the chest.  · Feeling of heaviness on the chest.  · Discomfort in the arms, neck, or jaw.  · Shortness of breath and nausea.  · Cold, wet skin.  · Angina is usually brought on by physical effort or excitement which increase the oxygen needs of the heart. These states increase the blood flow needs of the heart beyond what can be delivered.  TREATMENT   · Medicines to help discomfort may include nitroglycerin (nitro) in the form of tablets or a spray for rapid relief, or longer-acting forms such as cream, patches, or capsules. (Be aware that there are many side effects and possible interactions with other drugs).  · Other medicines may be used to help the heart pump better.  · Procedures to open blocked arteries including angioplasty or stent placement to keep the arteries open.  · Open heart surgery may be needed when there are many blockages or they are in critical locations that are best treated with surgery.  HOME CARE INSTRUCTIONS   · Avoid smoking.  · Take one baby or adult aspirin daily, if your caregiver advises. This helps reduce the risk of a heart attack.  · It is very important that you follow the  angina treatment prescribed by your caregiver. Make arrangements for proper follow-up care.  · Eat a heart healthy diet with salt and fat restrictions as advised.  · Regular exercise is good for you as long as it does not cause discomfort. Do not begin any new type of exercise until you check with your caregiver.  · If you are overweight, you should lose weight.  · Try to maintain normal blood lipid levels.  · Keep your blood pressure under control as recommended by your caregiver.  · You should tell your caregiver right away about any increase in the severity or frequency of your chest discomfort or angina attacks. When you have angina, you should stop what you are doing and sit down. This may bring relief in 3 to 5 minutes. If your caregiver has prescribed nitro, take it as directed.  · If your caregiver has given you a follow-up appointment, it is very important to keep that appointment. Not keeping the appointment could result in a chronic or permanent injury, pain, and disability. If there is any problem keeping the appointment, you must call back to this facility for assistance.  SEEK IMMEDIATE MEDICAL CARE IF:   · You develop nausea, vomiting, or shortness of breath.  · You feel faint, lightheaded, or pass out.  · Your chest discomfort gets worse.  · You are sweating or experience sudden profound fatigue.  · You do   not get relief of your chest pain after 3 doses of nitro.  · Your discomfort lasts longer than 15 minutes.  MAKE SURE YOU:   · Understand these instructions.  · Will watch your condition.  · Will get help right away if you are not doing well or get worse.  Document Released: 09/01/2005 Document Revised: 08/21/2011 Document Reviewed: 04/04/2008  ExitCare® Patient Information ©2012 [employer].

## 2012-02-14 DIAGNOSIS — E782 Mixed hyperlipidemia: Secondary | ICD-10-CM

## 2012-02-14 DIAGNOSIS — E1165 Type 2 diabetes mellitus with hyperglycemia: Secondary | ICD-10-CM

## 2012-02-14 DIAGNOSIS — I200 Unstable angina: Secondary | ICD-10-CM

## 2012-02-14 LAB — GLUCOSE, CAPILLARY: Glucose-Capillary: 129 mg/dL — ABNORMAL HIGH (ref 70–99)

## 2012-02-14 NOTE — Discharge Summary (Signed)
Patient ID: ISMAIL GRAZIANI MRN: [id number] DOB/AGE: 21-May-1941 71 y.o.  Admit date: 02/11/2012 Discharge date: 02/14/2012  Primary Care Physician:  Rudi Heap, MD, MD  Discharge Diagnoses:  Unstable angina  Present on Admission:  .Sleep apnea .Diabetes mellitus .Gout .Hyperlipidemia .COPD (chronic obstructive pulmonary disease) .ED (erectile dysfunction) .Peripheral neuropathy .High cholesterol .Unstable angina  Principal Problem:  *Unstable angina Active Problems:  Sleep apnea  Gout  Diabetes mellitus  Hyperlipidemia  COPD (chronic obstructive pulmonary disease)  ED (erectile dysfunction)  Peripheral neuropathy  High cholesterol   Medication List  As of 02/14/2012  9:50 AM   STOP taking these medications         aspirin 81 MG tablet         TAKE these medications         ADVAIR DISKUS 100-50 MCG/DOSE Aepb   Generic drug: Fluticasone-Salmeterol   Inhale 1 puff into the lungs every 12 (twelve) hours.      aspirin 325 MG EC tablet   Take 1 tablet (325 mg total) by mouth daily.      COMBIVENT 18-103 MCG/ACT inhaler   Generic drug: albuterol-ipratropium   Inhale 1 puff into the lungs every 6 (six) hours as needed.      isosorbide mononitrate 30 MG 24 hr tablet   Commonly known as: IMDUR   Take 1 tablet (30 mg total) by mouth daily.      lisinopril 10 MG tablet   Commonly known as: PRINIVIL,ZESTRIL   Take 10 mg by mouth daily.      metFORMIN 500 MG tablet   Commonly known as: GLUCOPHAGE   Take 250 mg by mouth 2 (two) times daily with a meal.      sildenafil 50 MG tablet   Commonly known as: VIAGRA   Take 50 mg by mouth daily as needed.      simvastatin 40 MG tablet   Commonly known as: ZOCOR   Take 40 mg by mouth every evening.      travoprost (benzalkonium) 0.004 % ophthalmic solution   Commonly known as: TRAVATAN   1 drop at bedtime.      Vitamin D 2000 UNITS tablet   Take 2,000 Units by mouth daily.            Disposition and  Follow-up: Pt has appointment follow up schedule with cardiology and will also need to see PCP as needed. Please note that Aspirin dose was changed from 81 mg PO QD to 325 mg PO QD. In addition, Imdur 30 MG QD was added to pt's medication regimen.   Consults:  cardiology  Significant Diagnostic Studies:  Dg Chest 2 View 02/11/2012   IMPRESSION:  Chronic bronchitic changes.  No evidence of active pulmonary disease.   Cardiac Cath by Dr. Anne Fu 02/13/2012  LEFT VENTRICULOGRAM: Left ventricular angiogram was done in the 30 RAO projection and revealed normal left ventricular wall motion and systolic function with an estimated ejection fraction of 65%.  IMPRESSIONS:  1. Moderate diffuse coronary artery disease-approximately 50% mid LAD (sharp bend to vessel but no bridging), 50% proximal first obtuse marginal branch, 50% tubular mid RCA. 2. Normal left ventricular systolic function. LVEDP 25 mmHg, diastolic dysfunction. Ejection fraction 65%. Minimal aortic valve gradient-no significant aortic stenosis. RECOMMENDATION: I have discussed films with Dr. Verdis Prime, his consulting cardiologist and we will continue with medical management. Aspirin, statin, isosorbide mononitrate 30 mg once a day. If he continues to have ongoing discomfort, one may consider nuclear study  to further evaluate for ischemia in the moderate CAD lesions described above. Nitroglycerin should be administered as an outpatient when necessary. He will be seeing Dr. Katrinka Blazing in followup in approximately one month.  Brief H and P: 71 yo man with hx of bilat CEA, DM, HL, remote tobacco use, presented to the Ed after a brief episode of retrosternal chest pain associated with dyspnea and diaphoresis. By the time he arrived the chest pain has subsided. He remembers having a cardiac cath about 8 years ago and he was told it was OK. He has not had any more chest pain since he has been in the Ed. Denies any recent travel, cough, fever, no  productivesputum.  Physical Exam on Discharge:  Filed Vitals:   02/13/12 1825 02/13/12 2015 02/14/12 0547 02/14/12 0839  BP: 137/73 117/68 149/69   Pulse: 80 71 65   Temp: 97.8 F (36.6 C) 97.2 F (36.2 C) 97.4 F (36.3 C)   TempSrc: Oral Oral Oral   Resp: 14 18 18    Height:      Weight:      SpO2: 97% 97% 98% 98%    Intake/Output Summary (Last 24 hours) at 02/14/12 0950 Last data filed at 02/13/12 2158  Gross per 24 hour  Intake 1226.07 ml  Output      0 ml  Net 1226.07 ml   General: Alert, awake, oriented x3, in no acute distress. HEENT: No bruits, no goiter. Heart: Regular rate and rhythm, without murmurs, rubs, gallops. Lungs: Clear to auscultation bilaterally. Abdomen: Soft, nontender, nondistended, positive bowel sounds. Extremities: No clubbing cyanosis or edema with positive pedal pulses. Neuro: Grossly intact, nonfocal.  LABS:  Lab 02/13/12 0348 02/12/12 0405 02/11/12 2050  WBC 7.8 9.4 11.2*  HGB 13.0 12.7* 13.2  HCT 37.8* 36.4* 37.6*  PLT 133* 156 167   Lab 02/13/12 0348 02/12/12 0405 02/11/12 2050  NA 136 138 134*  K 4.3 4.3 4.2  CL 101 104 99  CO2 25 23 22   GLUCOSE 155* 162* 235*  BUN 22 21 26*  CREATININE 1.12 1.01 1.06  CALCIUM 9.0 8.8 9.2    Lab 02/11/12 2050  INR 1.03  PROTIME --   Cardiac markers:  Lab 02/12/12 1015 02/12/12 0405 02/11/12 2240  CKMB 5.6* 5.5* 5.7*  TROPONINI <0.30 <0.30 <0.30  MYOGLOBIN -- -- --    Hospital Course:   Principal Problem:  *Unstable angina  - pt denies any chest pain this morning but certainly has multiple risk factors including hypertension, diabetes, smoking history  - please see cardiac cath results above - pt had Imdur added to medical regimen as well as aspirin dose was increased to full dose - will need to follow up with cardiology and that appointment was scheduled  Active Problems:  Diabetes mellitus, complications neuropathy and gastroparesis  - Diabetes appears to be well controlled  with A1C 5.8   Hyperlipidemia  - FLP is within normal limits  - will continue statin   Chronic respiratory failure secondary to COPD (chronic obstructive pulmonary disease)  - Appears to be stable clinically at this point but per CXR chronic bronchitic changes noted   Peripheral neuropathy  - Secondary to diabetes as noted above   EDUCATION  - test results and diagnostic studies were discussed with patient  - patient verbalized the understanding  - questions were answered at the bedside and contact information was provided for additional questions or concerns   Time spent on Discharge: Over 30 minutes  Signed:  MAGICK-Taijah Macrae 02/14/2012, 9:50 AM  [facility], pager #: [phone] Main office number: [phone]

## 2012-02-14 NOTE — Progress Notes (Signed)
Subjective:  Feeling well, no wrist pain, no CP, no SOB.   Objective:  Vital Signs in the last 24 hours: Temp:  [97.2 F (36.2 C)-97.8 F (36.6 C)] 97.4 F (36.3 C) (06/01 0547) Pulse Rate:  [65-80] 65  (06/01 0547) Resp:  [14-18] 18  (06/01 0547) BP: (117-149)/(68-73) 149/69 mmHg (06/01 0547) SpO2:  [97 %-98 %] 98 % (06/01 0547)  Intake/Output from previous day: 05/31 0701 - 06/01 0700 In: 1226.1 [P.O.:960; I.V.:266.1] Out: -    Physical Exam: General: Well developed, well nourished, in no acute distress. Head:  Normocephalic and atraumatic. Lungs: Clear to auscultation and percussion. Heart: Normal S1 and S2.  No murmur, rubs or gallops.  Pulses: Pulses normal in all 4 extremities. Radial cath site c/d/i/ with normal pulse Abdomen: soft, non-tender, positive bowel sounds. Extremities: No clubbing or cyanosis. No edema. Neurologic: Alert and oriented x 3.    Lab Results:  Basename 02/13/12 0348 02/12/12 0405  WBC 7.8 9.4  HGB 13.0 12.7*  PLT 133* 156    Basename 02/13/12 0348 02/12/12 0405  NA 136 138  K 4.3 4.3  CL 101 104  CO2 25 23  GLUCOSE 155* 162*  BUN 22 21  CREATININE 1.12 1.01    Basename 02/12/12 1015 02/12/12 0405  TROPONINI <0.30 <0.30   Hepatic Function Panel  Basename 02/11/12 2050  PROT 6.6  ALBUMIN 4.2  AST 20  ALT 17  ALKPHOS 52  BILITOT 1.0  BILIDIR --  IBILI --    Basename 02/12/12 0405  CHOL 125  Telemetry: no adverse rhythm Personally viewed.   Cardiac Studies:  Cath reviewed.   Assessment/Plan:   71 year old with chest pain concerning for [place] who underwent cath yesterday. Moderate disease CAD. No intervention.   CAD  - moderate. See cath report.   - OK for dc home  - ASA, statin, Imdur. NTG PRN  - No heavy lifting with wrist for 4 days.   - I have set up f/u with Dr. Katrinka Blazing  - If CP returns, please call Dr. Katrinka Blazing. Discussed. May need NUC to eval for flow limitations in moderate diseased arteries.    Hyperlipidemia  - statin  - goal LDL <70  PVD  - exercise, ASA, prevention       Intisar Claudio 02/14/2012, 8:39 AM

## 2012-06-12 ENCOUNTER — Other Ambulatory Visit (HOSPITAL_COMMUNITY): Payer: Self-pay | Admitting: Internal Medicine

## 2012-06-14 NOTE — Telephone Encounter (Signed)
Not OP pt.

## 2012-06-15 ENCOUNTER — Other Ambulatory Visit (HOSPITAL_COMMUNITY): Payer: Self-pay | Admitting: Internal Medicine

## 2013-01-13 ENCOUNTER — Other Ambulatory Visit: Payer: Self-pay | Admitting: Nurse Practitioner

## 2013-01-14 NOTE — Telephone Encounter (Signed)
SEE CONTRAINDICATIONS

## 2013-01-14 NOTE — Telephone Encounter (Signed)
DO NOT Take viagra- contraindicated with imdur

## 2013-01-14 NOTE — Telephone Encounter (Signed)
Patient aware not to take while on the imdur

## 2013-02-09 ENCOUNTER — Ambulatory Visit (INDEPENDENT_AMBULATORY_CARE_PROVIDER_SITE_OTHER): Payer: Medicare Other | Admitting: Nurse Practitioner

## 2013-02-09 ENCOUNTER — Encounter: Payer: Self-pay | Admitting: Nurse Practitioner

## 2013-02-09 VITALS — BP 104/60 | HR 68 | Temp 97.0°F | Ht 63.0 in | Wt 170.5 lb

## 2013-02-09 DIAGNOSIS — E785 Hyperlipidemia, unspecified: Secondary | ICD-10-CM

## 2013-02-09 DIAGNOSIS — I209 Angina pectoris, unspecified: Secondary | ICD-10-CM

## 2013-02-09 DIAGNOSIS — E119 Type 2 diabetes mellitus without complications: Secondary | ICD-10-CM

## 2013-02-09 DIAGNOSIS — I10 Essential (primary) hypertension: Secondary | ICD-10-CM

## 2013-02-09 LAB — COMPLETE METABOLIC PANEL WITH GFR
Albumin: 4.4 g/dL (ref 3.5–5.2)
Alkaline Phosphatase: 42 U/L (ref 39–117)
BUN: 16 mg/dL (ref 6–23)
Creat: 1.14 mg/dL (ref 0.50–1.35)
GFR, Est Non African American: 65 mL/min
Glucose, Bld: 130 mg/dL — ABNORMAL HIGH (ref 70–99)
Total Bilirubin: 1.6 mg/dL — ABNORMAL HIGH (ref 0.3–1.2)

## 2013-02-09 MED ORDER — SIMVASTATIN 40 MG PO TABS: 40.0000 mg | ORAL_TABLET | Freq: Every evening | ORAL | Status: DC

## 2013-02-09 NOTE — Patient Instructions (Signed)
Health Maintenance, Males A healthy lifestyle and preventative care can promote health and wellness.  Maintain regular health, dental, and eye exams.  Eat a healthy diet. Foods like vegetables, fruits, whole grains, low-fat dairy products, and lean protein foods contain the nutrients you need without too many calories. Decrease your intake of foods high in solid fats, added sugars, and salt. Get information about a proper diet from your caregiver, if necessary.  Regular physical exercise is one of the most important things you can do for your health. Most adults should get at least 150 minutes of moderate-intensity exercise (any activity that increases your heart rate and causes you to sweat) each week. In addition, most adults need muscle-strengthening exercises on 2 or more days a week.   Maintain a healthy weight. The body mass index (BMI) is a screening tool to identify possible weight problems. It provides an estimate of body fat based on height and weight. Your caregiver can help determine your BMI, and can help you achieve or maintain a healthy weight. For adults 20 years and older:  A BMI below 18.5 is considered underweight.  A BMI of 18.5 to 24.9 is normal.  A BMI of 25 to 29.9 is considered overweight.  A BMI of 30 and above is considered obese.  Maintain normal blood lipids and cholesterol by exercising and minimizing your intake of saturated fat. Eat a balanced diet with plenty of fruits and vegetables. Blood tests for lipids and cholesterol should begin at age 20 and be repeated every 5 years. If your lipid or cholesterol levels are high, you are over 50, or you are a high risk for heart disease, you may need your cholesterol levels checked more frequently.Ongoing high lipid and cholesterol levels should be treated with medicines, if diet and exercise are not effective.  If you smoke, find out from your caregiver how to quit. If you do not use tobacco, do not start.  If you  choose to drink alcohol, do not exceed 2 drinks per day. One drink is considered to be 12 ounces (355 mL) of beer, 5 ounces (148 mL) of wine, or 1.5 ounces (44 mL) of liquor.  Avoid use of street drugs. Do not share needles with anyone. Ask for help if you need support or instructions about stopping the use of drugs.  High blood pressure causes heart disease and increases the risk of stroke. Blood pressure should be checked at least every 1 to 2 years. Ongoing high blood pressure should be treated with medicines if weight loss and exercise are not effective.  If you are 45 to 70 years old, ask your caregiver if you should take aspirin to prevent heart disease.  Diabetes screening involves taking a blood sample to check your fasting blood sugar level. This should be done once every 3 years, after age 45, if you are within normal weight and without risk factors for diabetes. Testing should be considered at a younger age or be carried out more frequently if you are overweight and have at least 1 risk factor for diabetes.  Colorectal cancer can be detected and often prevented. Most routine colorectal cancer screening begins at the age of 50 and continues through age 75. However, your caregiver may recommend screening at an earlier age if you have risk factors for colon cancer. On a yearly basis, your caregiver may provide home test kits to check for hidden blood in the stool. Use of a small camera at the end of a tube,   to directly examine the colon (sigmoidoscopy or colonoscopy), can detect the earliest forms of colorectal cancer. Talk to your caregiver about this at age 50, when routine screening begins. Direct examination of the colon should be repeated every 5 to 10 years through age 75, unless early forms of pre-cancerous polyps or small growths are found.  Hepatitis C blood testing is recommended for all people born from 1945 through 1965 and any individual with known risks for hepatitis C.  Healthy  men should no longer receive prostate-specific antigen (PSA) blood tests as part of routine cancer screening. Consult with your caregiver about prostate cancer screening.  Testicular cancer screening is not recommended for adolescents or adult males who have no symptoms. Screening includes self-exam, caregiver exam, and other screening tests. Consult with your caregiver about any symptoms you have or any concerns you have about testicular cancer.  Practice safe sex. Use condoms and avoid high-risk sexual practices to reduce the spread of sexually transmitted infections (STIs).  Use sunscreen with a sun protection factor (SPF) of 30 or greater. Apply sunscreen liberally and repeatedly throughout the day. You should seek shade when your shadow is shorter than you. Protect yourself by wearing long sleeves, pants, a wide-brimmed hat, and sunglasses year round, whenever you are outdoors.  Notify your caregiver of new moles or changes in moles, especially if there is a change in shape or color. Also notify your caregiver if a mole is larger than the size of a pencil eraser.  A one-time screening for abdominal aortic aneurysm (AAA) and surgical repair of large AAAs by sound wave imaging (ultrasonography) is recommended for ages 65 to 75 years who are current or former smokers.  Stay current with your immunizations. Document Released: 02/28/2008 Document Revised: 11/24/2011 Document Reviewed: 01/27/2011 ExitCare Patient Information 2014 [employer].  

## 2013-02-09 NOTE — Progress Notes (Signed)
Subjective:    Patient ID: Dean Harris, male    DOB: 04/15/1941, 72 y.o.   MRN: [id number]  Diabetes He presents for his follow-up diabetic visit. He has type 2 diabetes mellitus. No MedicAlert identification noted. His disease course has been stable. There are no hypoglycemic associated symptoms. Pertinent negatives for diabetes include no chest pain, no fatigue, no foot paresthesias, no polydipsia, no polyphagia, no polyuria and no visual change. There are no hypoglycemic complications. Symptoms are stable. 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 Risk factors for coronary artery disease include dyslipidemia, hypertension and male sex. Current diabetic treatment includes oral agent (monotherapy). He is compliant with treatment all of the time. His weight is stable. He is following a generally healthy diet. When asked about meal planning, he reported none. He has not had a previous visit with a dietician. He participates in exercise every other day. There is no change in his home blood glucose trend. His breakfast blood glucose is taken between 8-9 am. His breakfast blood glucose range is generally 90-110 mg/dl. His overall blood glucose range is 90-110 mg/dl. An ACE inhibitor/angiotensin II receptor blocker is being taken. He does not see a podiatrist.Eye exam is current (6 months ago).  Hypertension This is a chronic problem. The current episode started more than 1 year ago. The problem is unchanged. The problem is controlled. Pertinent negatives include no chest pain, malaise/fatigue, palpitations, peripheral edema or shortness of breath. There are no associated agents to hypertension. Risk factors for coronary artery disease include diabetes mellitus, dyslipidemia and male gender. Past treatments include ACE inhibitors. The current treatment provides significant improvement. There are no compliance problems.  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  Hyperlipidemia This is a chronic  problem. The problem is controlled. Recent lipid tests were reviewed and are normal. Exacerbating diseases include diabetes. Pertinent negatives include no chest pain or shortness of breath. The current treatment provides moderate improvement of lipids. Risk factors for coronary artery disease include diabetes mellitus, hypertension and male sex.  Asthma Combivent PRN- has needed more lately d/t pollen outside. Doesn't use advair everyday either. Angina Imdur- No chest pain since started taking- Dr. Katrinka Blazing    Review of Systems  Constitutional: Negative for malaise/fatigue and fatigue.  Respiratory: Negative for shortness of breath.   Cardiovascular: Negative for chest pain and palpitations.  Endocrine: Negative for polydipsia, polyphagia and polyuria.       Objective:   Physical Exam  Constitutional: He is oriented to person, place, and time. He appears well-developed and well-nourished.  HENT:  Head: Normocephalic.  Right Ear: External ear normal.  Left Ear: External ear normal.  Nose: Nose normal.  Mouth/Throat: Oropharynx is clear and moist.  Eyes: EOM are normal. Pupils are equal, round, and reactive to light.  Neck: Normal range of motion. Neck supple. No thyromegaly present.  Cardiovascular: Normal rate, regular rhythm, normal heart sounds and intact distal pulses.   No murmur heard. Pulmonary/Chest: Effort normal. He has wheezes (bil lower lobes). He has no rales.  Abdominal: Soft. Bowel sounds are normal.  Musculoskeletal: Normal range of motion.  Neurological: He is alert and oriented to person, place, and time.  Skin: Skin is warm and dry.  Psychiatric: He has a normal mood and affect. His behavior is normal. Judgment and thought content normal.  BP 104/60  Pulse 68  Temp(Src) 97 F (36.1 C) (Oral)  Ht 5\' 3"  (1.6 m)  Wt 170 lb 8 oz (77.338 kg)  BMI 30.21 kg/m2 Results  for orders placed in visit on 02/09/13  POCT GLYCOSYLATED HEMOGLOBIN (HGB A1C)      Result Value  Range   Hemoglobin A1C 6.5     See diabetic foot exam        Assessment & Plan:   1. Diabetes   2. HTN (hypertension)   3. Other and unspecified hyperlipidemia   4. Angina, class I    Orders Placed This Encounter  Procedures  . COMPLETE METABOLIC PANEL WITH GFR  . NMR Lipoprofile with Lipids  . POCT glycosylated hemoglobin (Hb A1C)     Medication List       These changes are accurate as of: 02/09/2013 10:25 AM. If you have any questions, ask your nurse or doctor.          STOP taking these medications       sildenafil 50 MG tablet  Commonly known as:  VIAGRA  Stopped by:  Bennie Pierini, FNP      TAKE these medications       ADVAIR DISKUS 100-50 MCG/DOSE Aepb  Generic drug:  Fluticasone-Salmeterol  Inhale 1 puff into the lungs every 12 (twelve) hours.     aspirin 325 MG tablet  Take 325 mg by mouth daily.     COMBIVENT 18-103 MCG/ACT inhaler  Generic drug:  albuterol-ipratropium  Inhale 1 puff into the lungs every 6 (six) hours as needed.     isosorbide mononitrate 30 MG 24 hr tablet  Commonly known as:  IMDUR  TAKE ONE TABLET BY MOUTH EVERY DAY     lisinopril 10 MG tablet  Commonly known as:  PRINIVIL,ZESTRIL  Take 10 mg by mouth daily.     metFORMIN 500 MG tablet  Commonly known as:  GLUCOPHAGE  Take 250 mg by mouth 2 (two) times daily with a meal.     simvastatin 40 MG tablet  Commonly known as:  ZOCOR  Take 1 tablet (40 mg total) by mouth every evening.     travoprost (benzalkonium) 0.004 % ophthalmic solution  Commonly known as:  TRAVATAN  1 drop at bedtime.     Vitamin D 2000 UNITS tablet  Take 2,000 Units by mouth daily.       Low carb diet Exercise encouraged Follow/ up in 3 months  [name], FNP

## 2013-02-10 LAB — NMR LIPOPROFILE WITH LIPIDS
HDL Size: 8.8 nm — ABNORMAL LOW (ref >=9.2)
HDL-C: 41 mg/dL (ref >=40)
LDL Particle Number: 1190 nmol/L — ABNORMAL HIGH (ref <1000)
Large VLDL-P: 2.3 nmol/L (ref <=2.7)
Triglycerides: 167 mg/dL — ABNORMAL HIGH (ref <150)
VLDL Size: 44.7 nm (ref <=46.6)

## 2013-04-18 ENCOUNTER — Other Ambulatory Visit: Payer: Self-pay | Admitting: Nurse Practitioner

## 2013-04-29 ENCOUNTER — Telehealth: Payer: Self-pay | Admitting: Nurse Practitioner

## 2013-05-02 ENCOUNTER — Other Ambulatory Visit: Payer: Self-pay

## 2013-05-02 MED ORDER — ISOSORBIDE MONONITRATE ER 30 MG PO TB24: 30.0000 mg | ORAL_TABLET | Freq: Every day | ORAL | Status: DC

## 2013-05-02 NOTE — Telephone Encounter (Signed)
DONE

## 2013-05-04 ENCOUNTER — Encounter: Payer: Self-pay | Admitting: *Deleted

## 2013-05-13 ENCOUNTER — Ambulatory Visit (INDEPENDENT_AMBULATORY_CARE_PROVIDER_SITE_OTHER): Payer: Medicare Other | Admitting: Nurse Practitioner

## 2013-05-13 ENCOUNTER — Encounter: Payer: Self-pay | Admitting: Nurse Practitioner

## 2013-05-13 VITALS — BP 133/68 | HR 61 | Temp 96.8°F | Ht 63.0 in | Wt 170.0 lb

## 2013-05-13 DIAGNOSIS — E785 Hyperlipidemia, unspecified: Secondary | ICD-10-CM

## 2013-05-13 DIAGNOSIS — E559 Vitamin D deficiency, unspecified: Secondary | ICD-10-CM

## 2013-05-13 DIAGNOSIS — E119 Type 2 diabetes mellitus without complications: Secondary | ICD-10-CM

## 2013-05-13 DIAGNOSIS — J449 Chronic obstructive pulmonary disease, unspecified: Secondary | ICD-10-CM

## 2013-05-13 DIAGNOSIS — IMO0001 Reserved for inherently not codable concepts without codable children: Secondary | ICD-10-CM

## 2013-05-13 DIAGNOSIS — I10 Essential (primary) hypertension: Secondary | ICD-10-CM

## 2013-05-13 LAB — POCT GLYCOSYLATED HEMOGLOBIN (HGB A1C): Hemoglobin A1C: 6.1

## 2013-05-13 MED ORDER — FLUTICASONE-SALMETEROL 100-50 MCG/DOSE IN AEPB: 1.0000 | INHALATION_SPRAY | Freq: Two times a day (BID) | RESPIRATORY_TRACT | Status: DC

## 2013-05-13 MED ORDER — METFORMIN HCL 500 MG PO TABS: 250.0000 mg | ORAL_TABLET | Freq: Two times a day (BID) | ORAL | Status: DC

## 2013-05-13 MED ORDER — LISINOPRIL 10 MG PO TABS: 10.0000 mg | ORAL_TABLET | Freq: Every day | ORAL | Status: DC

## 2013-05-13 MED ORDER — IPRATROPIUM-ALBUTEROL 18-103 MCG/ACT IN AERO: 1.0000 | INHALATION_SPRAY | Freq: Four times a day (QID) | RESPIRATORY_TRACT | Status: DC | PRN

## 2013-05-13 MED ORDER — ISOSORBIDE MONONITRATE ER 30 MG PO TB24: 30.0000 mg | ORAL_TABLET | Freq: Every day | ORAL | Status: DC

## 2013-05-13 MED ORDER — SIMVASTATIN 40 MG PO TABS: 40.0000 mg | ORAL_TABLET | Freq: Every evening | ORAL | Status: DC

## 2013-05-13 NOTE — Progress Notes (Signed)
Subjective:    Patient ID: Dean Harris, male    DOB: [birth year], 72 y.o.   MRN: [id number]  Diabetes He presents for his follow-up diabetic visit. He has type 2 diabetes mellitus. No MedicAlert identification noted. His disease course has been stable. There are no hypoglycemic associated symptoms. Pertinent negatives for diabetes include no chest pain, no fatigue, no foot paresthesias, no polydipsia, no polyphagia, no polyuria and no visual change. There are no hypoglycemic complications. Symptoms are stable. 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 Risk factors for coronary artery disease include dyslipidemia, hypertension and male sex. Current diabetic treatment includes oral agent (monotherapy). He is compliant with treatment all of the time. His weight is stable. He is following a generally healthy diet. When asked about meal planning, he reported none. He has not had a previous visit with a dietician. He participates in exercise every other day. There is no change in his home blood glucose trend. His breakfast blood glucose is taken between 8-9 am. His breakfast blood glucose range is generally 90-110 mg/dl. His overall blood glucose range is 90-110 mg/dl. An ACE inhibitor/angiotensin II receptor blocker is being taken. He does not see a podiatrist.Eye exam is current (6 months ago).  Hypertension This is a chronic problem. The current episode started more than 1 year ago. The problem is unchanged. The problem is controlled. Pertinent negatives include no chest pain, malaise/fatigue, palpitations, peripheral edema or shortness of breath. There are no associated agents to hypertension. Risk factors for coronary artery disease include diabetes mellitus, dyslipidemia and male gender. Past treatments include ACE inhibitors. The current treatment provides significant improvement. There are no compliance problems.  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  Hyperlipidemia This is a chronic  problem. The problem is controlled. Recent lipid tests were reviewed and are normal. Exacerbating diseases include diabetes. Pertinent negatives include no chest pain or shortness of breath. The current treatment provides moderate improvement of lipids. Risk factors for coronary artery disease include diabetes mellitus, hypertension and male sex.  Asthma Combivent PRN- has needed more lately d/t pollen outside. Doesn't use advair everyday either. Angina Imdur- No chest pain since started taking- Dr. Katrinka Blazing    Review of Systems  Constitutional: Negative for malaise/fatigue and fatigue.  Respiratory: Negative for shortness of breath.   Cardiovascular: Negative for chest pain and palpitations.  Endocrine: Negative for polydipsia, polyphagia and polyuria.       Objective:   Physical Exam  Constitutional: He is oriented to person, place, and time. He appears well-developed and well-nourished.  HENT:  Head: Normocephalic.  Right Ear: External ear normal.  Left Ear: External ear normal.  Nose: Nose normal.  Mouth/Throat: Oropharynx is clear and moist.  Eyes: EOM are normal. Pupils are equal, round, and reactive to light.  Neck: Normal range of motion. Neck supple. No thyromegaly present.  Cardiovascular: Normal rate, regular rhythm, normal heart sounds and intact distal pulses.   No murmur heard. Pulmonary/Chest: Effort normal. He has wheezes (bil lower lobes). He has no rales.  Abdominal: Soft. Bowel sounds are normal.  Musculoskeletal: Normal range of motion.  Neurological: He is alert and oriented to person, place, and time.  Skin: Skin is warm and dry.  Psychiatric: He has a normal mood and affect. His behavior is normal. Judgment and thought content normal.  BP 133/68  Pulse 61  Temp(Src) 96.8 F (36 C) (Oral)  Ht 5\' 3"  (1.6 m)  Wt 170 lb (77.111 kg)  BMI 30.12 kg/m2 Results for orders  placed in visit on 05/13/13  POCT GLYCOSYLATED HEMOGLOBIN (HGB A1C)      Result Value Range    Hemoglobin A1C 6.1     See diabetic foot exam        Assessment & Plan:   1. Diabetes   2. Hypertension   3. Hyperlipidemia   4. Unspecified vitamin D deficiency    Orders Placed This Encounter  Procedures  . CMP14+EGFR  . NMR, lipoprofile  . Vit D  25 hydroxy (rtn osteoporosis monitoring)  . POCT glycosylated hemoglobin (Hb A1C)     Medication List       This list is accurate as of: 05/13/13 10:24 AM.  Always use your most recent med list.               ADVAIR DISKUS 100-50 MCG/DOSE Aepb  Generic drug:  Fluticasone-Salmeterol  Inhale 1 puff into the lungs every 12 (twelve) hours.     aspirin 325 MG tablet  Take 325 mg by mouth daily.     COMBIVENT 18-103 MCG/ACT inhaler  Generic drug:  albuterol-ipratropium  Inhale 1 puff into the lungs every 6 (six) hours as needed.     isosorbide mononitrate 30 MG 24 hr tablet  Commonly known as:  IMDUR  Take 1 tablet (30 mg total) by mouth daily.     lisinopril 10 MG tablet  Commonly known as:  PRINIVIL,ZESTRIL  TAKE ONE TABLET BY MOUTH EVERY DAY     metFORMIN 500 MG tablet  Commonly known as:  GLUCOPHAGE  Take 250 mg by mouth 2 (two) times daily with a meal.     simvastatin 40 MG tablet  Commonly known as:  ZOCOR  Take 1 tablet (40 mg total) by mouth every evening.     travoprost (benzalkonium) 0.004 % ophthalmic solution  Commonly known as:  TRAVATAN  1 drop at bedtime.     Vitamin D 2000 UNITS tablet  Take 2,000 Units by mouth daily.       Low carb diet Exercise encouraged Follow/ up in 3 months  [name], FNP

## 2013-05-15 LAB — NMR, LIPOPROFILE
HDL Cholesterol by NMR: 44 mg/dL (ref >=40)
LDL Size: 21.5 nm (ref >20.5)
LP-IR Score: 59 — ABNORMAL HIGH (ref <=45)
Small LDL Particle Number: 441 nmol/L (ref <=527)

## 2013-05-15 LAB — CMP14+EGFR
ALT: 18 IU/L (ref 0–44)
AST: 17 IU/L (ref 0–40)
Albumin/Globulin Ratio: 2.5 (ref 1.1–2.5)
Alkaline Phosphatase: 50 IU/L (ref 39–117)
Calcium: 9 mg/dL (ref 8.6–10.2)
GFR calc non Af Amer: 59 mL/min/{1.73_m2} — ABNORMAL LOW (ref >59)
Potassium: 4.6 mmol/L (ref 3.5–5.2)
Sodium: 138 mmol/L (ref 134–144)
Total Bilirubin: 1.8 mg/dL — ABNORMAL HIGH (ref 0.0–1.2)

## 2013-05-15 LAB — VITAMIN D 25 HYDROXY (VIT D DEFICIENCY, FRACTURES): Vit D, 25-Hydroxy: 38 ng/mL (ref 30.0–100.0)

## 2013-05-18 ENCOUNTER — Ambulatory Visit: Payer: Medicare Other | Admitting: Nurse Practitioner

## 2013-07-21 ENCOUNTER — Other Ambulatory Visit: Payer: Self-pay

## 2013-08-22 ENCOUNTER — Ambulatory Visit (INDEPENDENT_AMBULATORY_CARE_PROVIDER_SITE_OTHER): Payer: Medicare Other | Admitting: Nurse Practitioner

## 2013-08-22 ENCOUNTER — Encounter: Payer: Self-pay | Admitting: Nurse Practitioner

## 2013-08-22 VITALS — BP 125/62 | HR 67 | Temp 97.0°F | Ht 63.0 in | Wt 168.0 lb

## 2013-08-22 DIAGNOSIS — E119 Type 2 diabetes mellitus without complications: Secondary | ICD-10-CM

## 2013-08-22 DIAGNOSIS — I2510 Atherosclerotic heart disease of native coronary artery without angina pectoris: Secondary | ICD-10-CM

## 2013-08-22 DIAGNOSIS — E785 Hyperlipidemia, unspecified: Secondary | ICD-10-CM

## 2013-08-22 DIAGNOSIS — J449 Chronic obstructive pulmonary disease, unspecified: Secondary | ICD-10-CM

## 2013-08-22 DIAGNOSIS — IMO0001 Reserved for inherently not codable concepts without codable children: Secondary | ICD-10-CM

## 2013-08-22 LAB — POCT UA - MICROALBUMIN: Microalbumin Ur, POC: NEGATIVE mg/L

## 2013-08-22 LAB — POCT GLYCOSYLATED HEMOGLOBIN (HGB A1C): Hemoglobin A1C: 5.9

## 2013-08-22 MED ORDER — FLUTICASONE-SALMETEROL 100-50 MCG/DOSE IN AEPB: 1.0000 | INHALATION_SPRAY | Freq: Two times a day (BID) | RESPIRATORY_TRACT | Status: DC

## 2013-08-22 MED ORDER — METFORMIN HCL 500 MG PO TABS: 250.0000 mg | ORAL_TABLET | Freq: Two times a day (BID) | ORAL | Status: DC

## 2013-08-22 MED ORDER — IPRATROPIUM-ALBUTEROL 18-103 MCG/ACT IN AERO: 1.0000 | INHALATION_SPRAY | Freq: Four times a day (QID) | RESPIRATORY_TRACT | Status: DC | PRN

## 2013-08-22 MED ORDER — GLUCOSE BLOOD VI STRP: ORAL_STRIP | Status: DC

## 2013-08-22 NOTE — Patient Instructions (Signed)
Diabetes and Foot Care Diabetes may cause you to have problems because of poor blood supply (circulation) to your feet and legs. This may cause the skin on your feet to become thinner, break easier, and heal more slowly. Your skin may become dry, and the skin may peel and crack. You may also have nerve damage in your legs and feet causing decreased feeling in them. You may not notice minor injuries to your feet that could lead to infections or more serious problems. Taking care of your feet is one of the most important things you can do for yourself.  HOME CARE INSTRUCTIONS  Wear shoes at all times, even in the house. Do not go barefoot. Bare feet are easily injured.  Check your feet daily for blisters, cuts, and redness. If you cannot see the bottom of your feet, use a mirror or ask someone for help.  Wash your feet with warm water (do not use hot water) and mild soap. Then pat your feet and the areas between your toes until they are completely dry. Do not soak your feet as this can dry your skin.  Apply a moisturizing lotion or petroleum jelly (that does not contain alcohol and is unscented) to the skin on your feet and to dry, brittle toenails. Do not apply lotion between your toes.  Trim your toenails straight across. Do not dig under them or around the cuticle. File the edges of your nails with an emery board or nail file.  Do not cut corns or calluses or try to remove them with medicine.  Wear clean socks or stockings every day. Make sure they are not too tight. Do not wear knee-high stockings since they may decrease blood flow to your legs.  Wear shoes that fit properly and have enough cushioning. To break in new shoes, wear them for just a few hours a day. This prevents you from injuring your feet. Always look in your shoes before you put them on to be sure there are no objects inside.  Do not cross your legs. This may decrease the blood flow to your feet.  If you find a minor scrape,  cut, or break in the skin on your feet, keep it and the skin around it clean and dry. These areas may be cleansed with mild soap and water. Do not cleanse the area with peroxide, alcohol, or iodine.  When you remove an adhesive bandage, be sure not to damage the skin around it.  If you have a wound, look at it several times a day to make sure it is healing.  Do not use heating pads or hot water bottles. They may burn your skin. If you have lost feeling in your feet or legs, you may not know it is happening until it is too late.  Make sure your health care provider performs a complete foot exam at least annually or more often if you have foot problems. Report any cuts, sores, or bruises to your health care provider immediately. SEEK MEDICAL CARE IF:   You have an injury that is not healing.  You have cuts or breaks in the skin.  You have an ingrown nail.  You notice redness on your legs or feet.  You feel burning or tingling in your legs or feet.  You have pain or cramps in your legs and feet.  Your legs or feet are numb.  Your feet always feel cold. SEEK IMMEDIATE MEDICAL CARE IF:   There is increasing redness,   swelling, or pain in or around a wound.  There is a red line that goes up your leg.  Pus is coming from a wound.  You develop a fever or as directed by your health care provider.  You notice a bad smell coming from an ulcer or wound. Document Released: 08/29/2000 Document Revised: 05/04/2013 Document Reviewed: 02/08/2013 [employer] Patient Information 2014 [employer].  

## 2013-08-22 NOTE — Progress Notes (Signed)
Subjective:    Patient ID: Dean Harris, male    DOB: 1941/01/04, 72 y.o.   MRN: [id number]  Diabetes He presents for his follow-up diabetic visit. He has type 2 diabetes mellitus. No MedicAlert identification noted. His disease course has been stable. There are no hypoglycemic associated symptoms. Pertinent negatives for diabetes include no chest pain, no fatigue, no foot paresthesias, no polydipsia, no polyphagia, no polyuria and no visual change. There are no hypoglycemic complications. Symptoms are stable. 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 Risk factors for coronary artery disease include dyslipidemia, hypertension and male sex. Current diabetic treatment includes oral agent (monotherapy). He is compliant with treatment all of the time. His weight is stable. He is following a generally healthy diet. When asked about meal planning, he reported none. He has not had a previous visit with a dietician. He participates in exercise every other day. There is no change in his home blood glucose trend. His breakfast blood glucose is taken between 8-9 am. His breakfast blood glucose range is generally 110-130 mg/dl. His highest blood glucose is >200 mg/dl. His overall blood glucose range is 90-110 mg/dl. An ACE inhibitor/angiotensin II receptor blocker is being taken. He does not see a podiatrist.Eye exam is current (6 months ago).  Hypertension This is a chronic problem. The current episode started more than 1 year ago. The problem is unchanged. The problem is controlled. Pertinent negatives include no chest pain, malaise/fatigue, palpitations, peripheral edema or shortness of breath. There are no associated agents to hypertension. Risk factors for coronary artery disease include diabetes mellitus, dyslipidemia and male gender. Past treatments include ACE inhibitors. The current treatment provides significant improvement. There are no compliance problems.  Hypertensive end-organ damage includes CVA  (Y-10).  Hyperlipidemia This is a chronic problem. The problem is controlled. Recent lipid tests were reviewed and are normal. Exacerbating diseases include diabetes. Pertinent negatives include no chest pain or shortness of breath. The current treatment provides moderate improvement of lipids. Risk factors for coronary artery disease include diabetes mellitus, hypertension and male sex.  Asthma Combivent PRN- has needed more lately d/t pollen outside. Doesn't use advair everyday either. Angina Imdur- No chest pain since started taking- Dr. Katrinka Blazing    Review of Systems  Constitutional: Negative for malaise/fatigue and fatigue.  Respiratory: Negative for shortness of breath.   Cardiovascular: Negative for chest pain and palpitations.  Endocrine: Negative for polydipsia, polyphagia and polyuria.       Objective:   Physical Exam  Constitutional: He is oriented to person, place, and time. He appears well-developed and well-nourished.  HENT:  Head: Normocephalic.  Right Ear: External ear normal.  Left Ear: External ear normal.  Nose: Nose normal.  Mouth/Throat: Oropharynx is clear and moist.  Eyes: EOM are normal. Pupils are equal, round, and reactive to light.  Neck: Normal range of motion. Neck supple. No thyromegaly present.  Cardiovascular: Normal rate, regular rhythm, normal heart sounds and intact distal pulses.   No murmur heard. Pulmonary/Chest: Effort normal. He has wheezes (bil lower lobes). He has no rales.  Abdominal: Soft. Bowel sounds are normal.  Musculoskeletal: Normal range of motion.  Neurological: He is alert and oriented to person, place, and time.  Skin: Skin is warm and dry.  Psychiatric: He has a normal mood and affect. His behavior is normal. Judgment and thought content normal.  BP 125/62  Pulse 67  Temp(Src) 97 F (36.1 C) (Oral)  Ht 5\' 3"  (1.6 m)  Wt 168 lb (76.204 kg)  BMI 29.77 kg/m2 Results for orders placed in visit on 08/22/13  POCT GLYCOSYLATED  HEMOGLOBIN (HGB A1C)      Result Value Range   Hemoglobin A1C 5.9     See diabetic foot exam        Assessment & Plan:   1. Hyperlipidemia   2. CAD (coronary artery disease)   3. Diabetes    Orders Placed This Encounter  Procedures  . CMP14+EGFR  . NMR, lipoprofile  . POCT glycosylated hemoglobin (Hb A1C)   Meds ordered this encounter  Medications  . albuterol-ipratropium (COMBIVENT) 18-103 MCG/ACT inhaler    Sig: Inhale 1 puff into the lungs every 6 (six) hours as needed.    Dispense:  1 Inhaler    Refill:  5    Order Specific Question:  Supervising Provider    Answer:  Ernestina Penna [[id number]]  . Fluticasone-Salmeterol (ADVAIR DISKUS) 100-50 MCG/DOSE AEPB    Sig: Inhale 1 puff into the lungs every 12 (twelve) hours.    Dispense:  60 each    Refill:  5    Order Specific Question:  Supervising Provider    Answer:  Ernestina Penna [[id number]]  . metFORMIN (GLUCOPHAGE) 500 MG tablet    Sig: Take 0.5 tablets (250 mg total) by mouth 2 (two) times daily with a meal.    Dispense:  60 tablet    Refill:  5    Order Specific Question:  Supervising Provider    Answer:  Ernestina Penna [[id number]]  . glucose blood (FREESTYLE LITE) test strip    Sig: Test 1X per day and prn   Dx. 250.02    Dispense:  100 each    Refill:  12    Order Specific Question:  Supervising Provider    Answer:  Ernestina Penna [[id number]]     Continue all meds Labs pending Diet and exercise encouraged Health maintenance reviewed Follow up in 3 months  [name], FNP

## 2013-08-24 ENCOUNTER — Ambulatory Visit (INDEPENDENT_AMBULATORY_CARE_PROVIDER_SITE_OTHER): Payer: Medicare Other | Admitting: *Deleted

## 2013-08-24 DIAGNOSIS — Z23 Encounter for immunization: Secondary | ICD-10-CM

## 2013-08-24 LAB — CMP14+EGFR
ALT: 14 IU/L (ref 0–44)
AST: 13 IU/L (ref 0–40)
Alkaline Phosphatase: 47 IU/L (ref 39–117)
CO2: 26 mmol/L (ref 18–29)
Calcium: 9.2 mg/dL (ref 8.6–10.2)
Chloride: 100 mmol/L (ref 97–108)
Potassium: 4.5 mmol/L (ref 3.5–5.2)
Sodium: 139 mmol/L (ref 134–144)

## 2013-08-24 LAB — NMR, LIPOPROFILE
HDL Cholesterol by NMR: 46 mg/dL (ref >=40)
LDLC SERPL CALC-MCNC: 51 mg/dL (ref <100)
LP-IR Score: 68 — ABNORMAL HIGH (ref <=45)

## 2013-11-22 ENCOUNTER — Ambulatory Visit (INDEPENDENT_AMBULATORY_CARE_PROVIDER_SITE_OTHER): Payer: Medicare Other | Admitting: Nurse Practitioner

## 2013-11-22 ENCOUNTER — Encounter: Payer: Self-pay | Admitting: Nurse Practitioner

## 2013-11-22 VITALS — BP 113/67 | HR 74 | Temp 96.5°F | Ht 63.0 in | Wt 170.0 lb

## 2013-11-22 DIAGNOSIS — E119 Type 2 diabetes mellitus without complications: Secondary | ICD-10-CM

## 2013-11-22 DIAGNOSIS — G609 Hereditary and idiopathic neuropathy, unspecified: Secondary | ICD-10-CM

## 2013-11-22 DIAGNOSIS — G629 Polyneuropathy, unspecified: Secondary | ICD-10-CM

## 2013-11-22 DIAGNOSIS — I10 Essential (primary) hypertension: Secondary | ICD-10-CM

## 2013-11-22 DIAGNOSIS — E785 Hyperlipidemia, unspecified: Secondary | ICD-10-CM

## 2013-11-22 DIAGNOSIS — J449 Chronic obstructive pulmonary disease, unspecified: Secondary | ICD-10-CM

## 2013-11-22 DIAGNOSIS — I2510 Atherosclerotic heart disease of native coronary artery without angina pectoris: Secondary | ICD-10-CM

## 2013-11-22 LAB — POCT GLYCOSYLATED HEMOGLOBIN (HGB A1C): Hemoglobin A1C: 6.2

## 2013-11-22 MED ORDER — LISINOPRIL 10 MG PO TABS: 10.0000 mg | ORAL_TABLET | Freq: Every day | ORAL | Status: DC

## 2013-11-22 MED ORDER — SIMVASTATIN 40 MG PO TABS: 40.0000 mg | ORAL_TABLET | Freq: Every evening | ORAL | Status: DC

## 2013-11-22 MED ORDER — ISOSORBIDE MONONITRATE ER 30 MG PO TB24: 30.0000 mg | ORAL_TABLET | Freq: Every day | ORAL | Status: DC

## 2013-11-22 NOTE — Progress Notes (Signed)
Subjective:    Patient ID: Dean Harris, male    DOB: May 28, 1941, 73 y.o.   MRN: [id number]  Patient in today for follow up of chronic medical problems. No complaints today.  Diabetes He presents for his follow-up diabetic visit. He has type 2 diabetes mellitus. No MedicAlert identification noted. His disease course has been stable. There are no hypoglycemic associated symptoms. Pertinent negatives for diabetes include no chest pain, no fatigue, no foot paresthesias, no polydipsia, no polyphagia, no polyuria and no visual change. There are no hypoglycemic complications. Symptoms are stable. 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 Risk factors for coronary artery disease include dyslipidemia, hypertension and male sex. Current diabetic treatment includes oral agent (monotherapy). He is compliant with treatment all of the time. His weight is stable. He is following a generally healthy diet. When asked about meal planning, he reported none. He has not had a previous visit with a dietician. He participates in exercise every other day. There is no change in his home blood glucose trend. His breakfast blood glucose is taken between 8-9 am. His breakfast blood glucose range is generally 90-110 mg/dl. His overall blood glucose range is 90-110 mg/dl. An ACE inhibitor/angiotensin II receptor blocker is being taken. He does not see a podiatrist.Eye exam is current (6 months ago).  Hypertension This is a chronic problem. The current episode started more than 1 year ago. The problem is unchanged. The problem is controlled. Pertinent negatives include no chest pain, malaise/fatigue, palpitations, peripheral edema or shortness of breath. There are no associated agents to hypertension. Risk factors for coronary artery disease include diabetes mellitus, dyslipidemia and male gender. Past treatments include ACE inhibitors. The current treatment provides significant improvement. There are no compliance problems.   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  Hyperlipidemia This is a chronic problem. The problem is controlled. Recent lipid tests were reviewed and are normal. Exacerbating diseases include diabetes. Pertinent negatives include no chest pain or shortness of breath. The current treatment provides moderate improvement of lipids. Risk factors for coronary artery disease include diabetes mellitus, hypertension and male sex.  Asthma Combivent PRN- has needed more lately d/t pollen outside. Doesn't use advair everyday either. Angina Imdur- No chest pain since started taking- Dr. [name]    Review of Systems  Constitutional: Negative for malaise/fatigue and fatigue.  Respiratory: Negative for shortness of breath.   Cardiovascular: Negative for chest pain and palpitations.  Endocrine: Negative for polydipsia, polyphagia and polyuria.       Objective:   Physical Exam  Constitutional: He is oriented to person, place, and time. He appears well-developed and well-nourished.  HENT:  Head: Normocephalic.  Right Ear: External ear normal.  Left Ear: External ear normal.  Nose: Nose normal.  Mouth/Throat: Oropharynx is clear and moist.  Eyes: EOM are normal. Pupils are equal, round, and reactive to light.  Neck: Normal range of motion. Neck supple. No thyromegaly present.  Cardiovascular: Normal rate, regular rhythm, normal heart sounds and intact distal pulses.   No murmur heard. Pulmonary/Chest: Effort normal. He has wheezes (bil lower lobes). He has no rales.  Abdominal: Soft. Bowel sounds are normal.  Musculoskeletal: Normal range of motion.  Neurological: He is alert and oriented to person, place, and time.  Skin: Skin is warm and dry.  Psychiatric: He has a normal mood and affect. His behavior is normal. Judgment and thought content normal.  BP 113/67  Pulse 74  Temp(Src) 96.5 F (35.8 C) (Oral)  Ht $R'5\' 3"'zB$  (1.6  m)  Wt 170 lb (77.111 kg)  BMI 30.12 kg/m2 Results for orders  placed in visit on 11/22/13  POCT GLYCOSYLATED HEMOGLOBIN (HGB A1C)      Result Value Ref Range   Hemoglobin A1C 6.2     See diabetic foot exam        Assessment & Plan:   1. Type II or unspecified type diabetes mellitus without mention of complication, not stated as uncontrolled   2. Hyperlipidemia   3. Peripheral neuropathy   4. COPD (chronic obstructive pulmonary disease)   5. CAD (coronary artery disease)   6. Hypertension    Orders Placed This Encounter  Procedures  . CMP14+EGFR  . NMR, lipoprofile  . POCT glycosylated hemoglobin (Hb A1C)   Meds ordered this encounter  Medications  . lisinopril (PRINIVIL,ZESTRIL) 10 MG tablet    Sig: Take 1 tablet (10 mg total) by mouth daily.    Dispense:  30 tablet    Refill:  5    Needs to be seen before next refill    Order Specific Question:  Supervising Provider    Answer:  Chipper Herb [[id number]]  . simvastatin (ZOCOR) 40 MG tablet    Sig: Take 1 tablet (40 mg total) by mouth every evening.    Dispense:  30 tablet    Refill:  5    Order Specific Question:  Supervising Provider    Answer:  Chipper Herb [[id number]]  . isosorbide mononitrate (IMDUR) 30 MG 24 hr tablet    Sig: Take 1 tablet (30 mg total) by mouth daily.    Dispense:  30 tablet    Refill:  5    Order Specific Question:  Supervising Provider    Answer:  Chipper Herb [[id number]]    Labs pending Health maintenance reviewed Diet and exercise encouraged Continue all meds Follow up  In 3 months   [place], FNP

## 2013-11-22 NOTE — Patient Instructions (Signed)

## 2013-11-24 LAB — NMR, LIPOPROFILE
CHOLESTEROL: 141 mg/dL (ref <200)
HDL Cholesterol by NMR: 45 mg/dL (ref >=40)
HDL Particle Number: 32 umol/L (ref >=30.5)
LDL Particle Number: 959 nmol/L (ref <1000)
LDL SIZE: 21.4 nm (ref >20.5)
LDLC SERPL CALC-MCNC: 70 mg/dL (ref <100)
LP-IR Score: 53 — ABNORMAL HIGH (ref <=45)
SMALL LDL PARTICLE NUMBER: 377 nmol/L (ref <=527)
TRIGLYCERIDES BY NMR: 132 mg/dL (ref <150)

## 2013-11-24 LAB — CMP14+EGFR
A/G RATIO: 2.3 (ref 1.1–2.5)
ALBUMIN: 4.5 g/dL (ref 3.5–4.8)
ALT: 19 IU/L (ref 0–44)
AST: 21 IU/L (ref 0–40)
Alkaline Phosphatase: 49 IU/L (ref 39–117)
BILIRUBIN TOTAL: 1.3 mg/dL — AB (ref 0.0–1.2)
BUN / CREAT RATIO: 18 (ref 10–22)
BUN: 22 mg/dL (ref 8–27)
CO2: 25 mmol/L (ref 18–29)
Calcium: 9.4 mg/dL (ref 8.6–10.2)
Chloride: 96 mmol/L — ABNORMAL LOW (ref 97–108)
Creatinine, Ser: 1.22 mg/dL (ref 0.76–1.27)
GFR, EST AFRICAN AMERICAN: 69 mL/min/{1.73_m2} (ref >59)
GFR, EST NON AFRICAN AMERICAN: 60 mL/min/{1.73_m2} (ref >59)
GLUCOSE: 130 mg/dL — AB (ref 65–99)
Globulin, Total: 2 g/dL (ref 1.5–4.5)
POTASSIUM: 4.5 mmol/L (ref 3.5–5.2)
Sodium: 137 mmol/L (ref 134–144)
TOTAL PROTEIN: 6.5 g/dL (ref 6.0–8.5)

## 2013-12-02 ENCOUNTER — Ambulatory Visit (INDEPENDENT_AMBULATORY_CARE_PROVIDER_SITE_OTHER): Payer: Medicare Other | Admitting: *Deleted

## 2013-12-02 DIAGNOSIS — Z23 Encounter for immunization: Secondary | ICD-10-CM

## 2013-12-02 NOTE — Progress Notes (Signed)
VIS GIVEN- PT TOLERATED WELL

## 2014-01-13 ENCOUNTER — Encounter: Payer: Self-pay | Admitting: *Deleted

## 2014-02-22 ENCOUNTER — Other Ambulatory Visit: Payer: Self-pay | Admitting: *Deleted

## 2014-02-22 DIAGNOSIS — E785 Hyperlipidemia, unspecified: Secondary | ICD-10-CM

## 2014-02-22 MED ORDER — SIMVASTATIN 40 MG PO TABS
40.0000 mg | ORAL_TABLET | Freq: Every evening | ORAL | Status: DC
Start: 2014-02-22 — End: 2014-03-03

## 2014-03-03 ENCOUNTER — Encounter: Payer: Self-pay | Admitting: Nurse Practitioner

## 2014-03-03 ENCOUNTER — Ambulatory Visit (INDEPENDENT_AMBULATORY_CARE_PROVIDER_SITE_OTHER): Payer: Medicare Other | Admitting: Nurse Practitioner

## 2014-03-03 VITALS — BP 106/68 | HR 70 | Temp 97.7°F | Ht 63.0 in | Wt 171.8 lb

## 2014-03-03 DIAGNOSIS — G4730 Sleep apnea, unspecified: Secondary | ICD-10-CM

## 2014-03-03 DIAGNOSIS — I10 Essential (primary) hypertension: Secondary | ICD-10-CM

## 2014-03-03 DIAGNOSIS — G609 Hereditary and idiopathic neuropathy, unspecified: Secondary | ICD-10-CM

## 2014-03-03 DIAGNOSIS — G629 Polyneuropathy, unspecified: Secondary | ICD-10-CM

## 2014-03-03 DIAGNOSIS — E785 Hyperlipidemia, unspecified: Secondary | ICD-10-CM

## 2014-03-03 DIAGNOSIS — J42 Unspecified chronic bronchitis: Secondary | ICD-10-CM

## 2014-03-03 DIAGNOSIS — E119 Type 2 diabetes mellitus without complications: Secondary | ICD-10-CM

## 2014-03-03 DIAGNOSIS — L719 Rosacea, unspecified: Secondary | ICD-10-CM

## 2014-03-03 LAB — POCT GLYCOSYLATED HEMOGLOBIN (HGB A1C): Hemoglobin A1C: 6.4

## 2014-03-03 MED ORDER — METFORMIN HCL 500 MG PO TABS: 250.0000 mg | ORAL_TABLET | Freq: Two times a day (BID) | ORAL | Status: DC

## 2014-03-03 MED ORDER — SIMVASTATIN 40 MG PO TABS: 40.0000 mg | ORAL_TABLET | Freq: Every evening | ORAL | Status: DC

## 2014-03-03 MED ORDER — ISOSORBIDE MONONITRATE ER 30 MG PO TB24: 30.0000 mg | ORAL_TABLET | Freq: Every day | ORAL | Status: DC

## 2014-03-03 MED ORDER — LISINOPRIL 10 MG PO TABS: 10.0000 mg | ORAL_TABLET | Freq: Every day | ORAL | Status: DC

## 2014-03-03 NOTE — Progress Notes (Signed)
Subjective:    Patient ID: Dean Harris, male    DOB: [birth year], 73 y.o.   MRN: [id number]  Patient in today for follow up of chronic medical problems. No complaints today.  Diabetes He presents for his follow-up diabetic visit. He has type 2 diabetes mellitus. No MedicAlert identification noted. His disease course has been stable. There are no hypoglycemic associated symptoms. Pertinent negatives for diabetes include no chest pain, no fatigue, no foot paresthesias, no polydipsia, no polyphagia, no polyuria and no visual change. There are no hypoglycemic complications. Symptoms are stable. 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 Risk factors for coronary artery disease include dyslipidemia, hypertension and male sex. Current diabetic treatment includes oral agent (monotherapy). He is compliant with treatment all of the time. His weight is stable. He is following a generally healthy diet. When asked about meal planning, he reported none. He has not had a previous visit with a dietician. He participates in exercise every other day. There is no change in his home blood glucose trend. His breakfast blood glucose is taken between 8-9 am. His breakfast blood glucose range is generally 90-110 mg/dl. His overall blood glucose range is 90-110 mg/dl. An ACE inhibitor/angiotensin II receptor blocker is being taken. He does not see a podiatrist.Eye exam is current (6 months ago).  Hyperlipidemia This is a chronic problem. The problem is controlled. Recent lipid tests were reviewed and are normal. Exacerbating diseases include diabetes. Pertinent negatives include no chest pain or shortness of breath. The current treatment provides moderate improvement of lipids. Risk factors for coronary artery disease include diabetes mellitus, hypertension and male sex.  Hypertension This is a chronic problem. The current episode started more than 1 year ago. The problem is unchanged. The problem is controlled. Pertinent  negatives include no chest pain, malaise/fatigue, palpitations, peripheral edema or shortness of breath. There are no associated agents to hypertension. Risk factors for coronary artery disease include diabetes mellitus, dyslipidemia and male gender. Past treatments include ACE inhibitors. The current treatment provides significant improvement. There are no compliance problems.  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  Asthma Combivent PRN, . Angina Imdur- No chest pain since started taking- Dr. [name]    Review of Systems  Constitutional: Negative for malaise/fatigue and fatigue.  Respiratory: Negative for shortness of breath.   Cardiovascular: Negative for chest pain and palpitations.  Endocrine: Negative for polydipsia, polyphagia and polyuria.       Objective:   Physical Exam  Constitutional: He is oriented to person, place, and time. He appears well-developed and well-nourished.  HENT:  Head: Normocephalic.  Right Ear: External ear normal.  Left Ear: External ear normal.  Nose: Nose normal.  Mouth/Throat: Oropharynx is clear and moist.  Eyes: EOM are normal. Pupils are equal, round, and reactive to light.  Neck: Normal range of motion. Neck supple. No thyromegaly present.  Cardiovascular: Normal rate, regular rhythm, normal heart sounds and intact distal pulses.   No murmur heard. Pulmonary/Chest: Effort normal. He has wheezes (bil lower lobes). He has no rales.  Abdominal: Soft. Bowel sounds are normal.  Musculoskeletal: Normal range of motion.  Neurological: He is alert and oriented to person, place, and time.  Skin: Skin is warm and dry.  Psychiatric: He has a normal mood and affect. His behavior is normal. Judgment and thought content normal.   BP 106/68  Pulse 70  Temp(Src) 97.7 F (36.5 C) (Oral)  Ht '5\' 3"'  (1.6 m)  Wt 171 lb 12.8 oz (77.928 kg)  BMI 30.44 kg/m2  Results for orders placed in visit on 03/03/14  POCT GLYCOSYLATED HEMOGLOBIN (HGB A1C)       Result Value Ref Range   Hemoglobin A1C 6.4           Assessment & Plan:   1. Type II or unspecified type diabetes mellitus without mention of complication, not stated as uncontrolled   2. Hyperlipidemia   3. Peripheral neuropathy   4. Essential hypertension, benign   5. Chronic bronchitis, unspecified chronic bronchitis type   6. Rosacea   7. Sleep apnea   8. Type 2 diabetes mellitus without complication   9. Essential hypertension    Orders Placed This Encounter  Procedures  . CMP14+EGFR  . NMR, lipoprofile  . POCT glycosylated hemoglobin (Hb A1C)   Meds ordered this encounter  Medications  . metFORMIN (GLUCOPHAGE) 500 MG tablet    Sig: Take 0.5 tablets (250 mg total) by mouth 2 (two) times daily with a meal.    Dispense:  60 tablet    Refill:  5    Order Specific Question:  Supervising Provider    Answer:  Chipper Herb [[id number]]  . simvastatin (ZOCOR) 40 MG tablet    Sig: Take 1 tablet (40 mg total) by mouth every evening.    Dispense:  30 tablet    Refill:  5    Order Specific Question:  Supervising Provider    Answer:  Chipper Herb [[id number]]  . isosorbide mononitrate (IMDUR) 30 MG 24 hr tablet    Sig: Take 1 tablet (30 mg total) by mouth daily.    Dispense:  30 tablet    Refill:  5    Order Specific Question:  Supervising Provider    Answer:  Chipper Herb [[id number]]  . lisinopril (PRINIVIL,ZESTRIL) 10 MG tablet    Sig: Take 1 tablet (10 mg total) by mouth daily.    Dispense:  30 tablet    Refill:  5    Needs to be seen before next refill    Order Specific Question:  Supervising Provider    Answer:  Chipper Herb [[id number]]    Labs pending Health maintenance reviewed Diet and exercise encouraged Continue all meds Follow up  In 3 month   [place], FNP

## 2014-03-04 LAB — NMR, LIPOPROFILE
CHOLESTEROL: 137 mg/dL (ref 100–199)
HDL Cholesterol by NMR: 41 mg/dL (ref >39)
HDL PARTICLE NUMBER: 28 umol/L — AB (ref >=30.5)
LDL Particle Number: 852 nmol/L (ref <1000)
LDL SIZE: 20.8 nm (ref >20.5)
LDLC SERPL CALC-MCNC: 72 mg/dL (ref 0–99)
LP-IR Score: 62 — ABNORMAL HIGH (ref <=45)
Small LDL Particle Number: 196 nmol/L (ref <=527)
Triglycerides by NMR: 120 mg/dL (ref 0–149)

## 2014-03-04 LAB — CMP14+EGFR
A/G RATIO: 2.4 (ref 1.1–2.5)
ALBUMIN: 4.3 g/dL (ref 3.5–4.8)
ALK PHOS: 44 IU/L (ref 39–117)
ALT: 15 IU/L (ref 0–44)
AST: 17 IU/L (ref 0–40)
BILIRUBIN TOTAL: 1.6 mg/dL — AB (ref 0.0–1.2)
BUN / CREAT RATIO: 21 (ref 10–22)
BUN: 26 mg/dL (ref 8–27)
CO2: 24 mmol/L (ref 18–29)
CREATININE: 1.25 mg/dL (ref 0.76–1.27)
Calcium: 9 mg/dL (ref 8.6–10.2)
Chloride: 104 mmol/L (ref 97–108)
GFR calc Af Amer: 67 mL/min/{1.73_m2} (ref >59)
GFR, EST NON AFRICAN AMERICAN: 58 mL/min/{1.73_m2} — AB (ref >59)
GLOBULIN, TOTAL: 1.8 g/dL (ref 1.5–4.5)
Glucose: 115 mg/dL — ABNORMAL HIGH (ref 65–99)
Potassium: 4.7 mmol/L (ref 3.5–5.2)
Sodium: 142 mmol/L (ref 134–144)
Total Protein: 6.1 g/dL (ref 6.0–8.5)

## 2014-03-28 ENCOUNTER — Encounter: Payer: Self-pay | Admitting: Nurse Practitioner

## 2014-03-28 ENCOUNTER — Ambulatory Visit (INDEPENDENT_AMBULATORY_CARE_PROVIDER_SITE_OTHER): Payer: Medicare Other

## 2014-03-28 ENCOUNTER — Ambulatory Visit (INDEPENDENT_AMBULATORY_CARE_PROVIDER_SITE_OTHER): Payer: Medicare Other | Admitting: Nurse Practitioner

## 2014-03-28 VITALS — BP 108/68 | HR 64 | Temp 97.8°F | Ht 63.0 in | Wt 172.0 lb

## 2014-03-28 DIAGNOSIS — E785 Hyperlipidemia, unspecified: Secondary | ICD-10-CM

## 2014-03-28 DIAGNOSIS — L719 Rosacea, unspecified: Secondary | ICD-10-CM

## 2014-03-28 DIAGNOSIS — I200 Unstable angina: Secondary | ICD-10-CM

## 2014-03-28 DIAGNOSIS — G629 Polyneuropathy, unspecified: Secondary | ICD-10-CM

## 2014-03-28 DIAGNOSIS — Z87891 Personal history of nicotine dependence: Secondary | ICD-10-CM

## 2014-03-28 DIAGNOSIS — M1009 Idiopathic gout, multiple sites: Secondary | ICD-10-CM

## 2014-03-28 DIAGNOSIS — Z0000 Encounter for general adult medical examination without abnormal findings: Secondary | ICD-10-CM

## 2014-03-28 DIAGNOSIS — G609 Hereditary and idiopathic neuropathy, unspecified: Secondary | ICD-10-CM

## 2014-03-28 DIAGNOSIS — I10 Essential (primary) hypertension: Secondary | ICD-10-CM

## 2014-03-28 DIAGNOSIS — E119 Type 2 diabetes mellitus without complications: Secondary | ICD-10-CM

## 2014-03-28 DIAGNOSIS — Z125 Encounter for screening for malignant neoplasm of prostate: Secondary | ICD-10-CM

## 2014-03-28 DIAGNOSIS — M109 Gout, unspecified: Secondary | ICD-10-CM

## 2014-03-28 LAB — POCT CBC
GRANULOCYTE PERCENT: 57 % (ref 37–80)
HCT, POC: 42 % — AB (ref 43.5–53.7)
HEMOGLOBIN: 14.2 g/dL (ref 14.1–18.1)
Lymph, poc: 2.2 (ref 0.6–3.4)
MCH: 29.1 pg (ref 27–31.2)
MCHC: 33.8 g/dL (ref 31.8–35.4)
MCV: 86 fL (ref 80–97)
MPV: 9 fL (ref 0–99.8)
POC Granulocyte: 3.1 (ref 2–6.9)
POC LYMPH PERCENT: 39.4 %L (ref 10–50)
Platelet Count, POC: 121 10*3/uL — AB (ref 142–424)
RBC: 4.9 M/uL (ref 4.69–6.13)
RDW, POC: 13.5 %
WBC: 5.5 10*3/uL (ref 4.6–10.2)

## 2014-03-28 NOTE — Progress Notes (Signed)
Subjective:    Patient ID: Lorin GlassDaniel C Elamin, male    DOB: Dec 28, 1940, 73 y.o.   MRN: [id number]  Patient is here today for his annual physical. No complaints.   Diabetes He presents for his follow-up diabetic visit. He has type 2 diabetes mellitus. No MedicAlert identification noted. His disease course has been stable. There are no hypoglycemic associated symptoms. Pertinent negatives for diabetes include no chest pain, no fatigue, no foot paresthesias, no polydipsia, no polyphagia, no polyuria and no visual change. There are no hypoglycemic complications. Symptoms are stable. 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 Risk factors for coronary artery disease include dyslipidemia, hypertension and male sex. Current diabetic treatment includes oral agent (monotherapy). He is compliant with treatment all of the time. His weight is stable. He is following a generally healthy diet. When asked about meal planning, he reported none. He has not had a previous visit with a dietician. He participates in exercise every other day. There is no change in his home blood glucose trend. His breakfast blood glucose is taken between 8-9 am. His breakfast blood glucose range is generally 90-110 mg/dl. His overall blood glucose range is 90-110 mg/dl. An ACE inhibitor/angiotensin II receptor blocker is being taken. He does not see a podiatrist.Eye exam is current (6 months ago).  Hyperlipidemia This is a chronic problem. The problem is controlled. Recent lipid tests were reviewed and are normal. Exacerbating diseases include diabetes. Pertinent negatives include no chest pain or shortness of breath. The current treatment provides moderate improvement of lipids. Risk factors for coronary artery disease include diabetes mellitus, hypertension and male sex.  Hypertension This is a chronic problem. The current episode started more than 1 year ago. The problem is unchanged. The problem is controlled. Pertinent negatives include  no chest pain, malaise/fatigue, palpitations, peripheral edema or shortness of breath. There are no associated agents to hypertension. Risk factors for coronary artery disease include diabetes mellitus, dyslipidemia and male gender. Past treatments include ACE inhibitors. The current treatment provides significant improvement. There are no compliance problems.  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  Asthma Combivent PRN, . Angina Imdur- patient is not seeing a cardiologist currently.     Review of Systems  Constitutional: Negative for malaise/fatigue and fatigue.  Respiratory: Negative for shortness of breath.   Cardiovascular: Negative for chest pain and palpitations.  Endocrine: Negative for polydipsia, polyphagia and polyuria.       Objective:   Physical Exam  Constitutional: He is oriented to person, place, and time. He appears well-developed and well-nourished.  HENT:  Head: Normocephalic.  Right Ear: External ear normal.  Left Ear: External ear normal.  Nose: Nose normal.  Mouth/Throat: Oropharynx is clear and moist.  Eyes: EOM are normal. Pupils are equal, round, and reactive to light.  Neck: Normal range of motion. Neck supple. No thyromegaly present.  Cardiovascular: Normal rate, regular rhythm, normal heart sounds and intact distal pulses.   No murmur heard. Pulmonary/Chest: Effort normal. He has wheezes (bil lower lobes). He has no rales.  Abdominal: Soft. Bowel sounds are normal.  Musculoskeletal: Normal range of motion.  Neurological: He is alert and oriented to person, place, and time.  Skin: Skin is warm and dry.  Psychiatric: He has a normal mood and affect. His behavior is normal. Judgment and thought content normal.   BP 108/68  Pulse 64  Temp(Src) 97.8 F (36.6 C) (Oral)  Ht 5\' 3"  (1.6 m)  Wt 172 lb (78.019 kg)  BMI 30.48 kg/m2 EKG--Sinus  Leron Croak, FNP Chest x ray- chronic bronchitic changes-Preliminary reading by  Paulene Floor, FNP  [facility]      Assessment & Plan:   1. Annual physical exam   2. Unstable angina   3. Type II or unspecified type diabetes mellitus without mention of complication, not stated as uncontrolled   4. Rosacea   5. Hyperlipidemia   6. Idiopathic gout of multiple sites, unspecified chronicity   7. Essential hypertension, benign   8. Peripheral neuropathy   9. Prostate cancer screening   10. Smoking history    Orders Placed This Encounter  Procedures  . DG Chest 2 View    Standing Status: Future     Number of Occurrences:      Standing Expiration Date: 05/28/2015    Order Specific Question:  Reason for Exam (SYMPTOM  OR DIAGNOSIS REQUIRED)    Answer:  screening    Order Specific Question:  Preferred imaging location?    Answer:  Internal  . PSA  . Thyroid Panel With TSH  . POCT CBC  . EKG 12-Lead    Labs pending Health maintenance reviewed Diet and exercise encouraged Continue all meds Follow up  In PRN    [name], FNP

## 2014-03-28 NOTE — Patient Instructions (Signed)

## 2014-03-29 LAB — THYROID PANEL WITH TSH
FREE THYROXINE INDEX: 2 (ref 1.2–4.9)
T3 UPTAKE RATIO: 33 % (ref 24–39)
T4 TOTAL: 6.2 ug/dL (ref 4.5–12.0)
TSH: 2.23 u[IU]/mL (ref 0.450–4.500)

## 2014-03-29 LAB — PSA: PSA: 1.1 ng/mL (ref 0.0–4.0)

## 2014-03-30 ENCOUNTER — Telehealth: Payer: Self-pay | Admitting: *Deleted

## 2014-03-30 NOTE — Telephone Encounter (Signed)
Message copied by Almeta MonasSTONE, Hendrik Donath M on Thu Mar 30, 2014  3:57 PM ------      Message from: Bennie PieriniMARTIN, MARY-MARGARET      Created: Wed Mar 29, 2014  1:21 PM       platlets are low- need to recheck in 2 weeks      PSA normal      Thyroid normal ------

## 2014-03-30 NOTE — Telephone Encounter (Signed)
Aware of lab results and need for recheck on platelets in two weeks.

## 2014-04-25 ENCOUNTER — Other Ambulatory Visit: Payer: Self-pay | Admitting: Nurse Practitioner

## 2014-04-25 ENCOUNTER — Other Ambulatory Visit (INDEPENDENT_AMBULATORY_CARE_PROVIDER_SITE_OTHER): Payer: Medicare Other

## 2014-04-25 DIAGNOSIS — R7989 Other specified abnormal findings of blood chemistry: Secondary | ICD-10-CM

## 2014-04-25 DIAGNOSIS — D696 Thrombocytopenia, unspecified: Secondary | ICD-10-CM

## 2014-04-25 LAB — POCT CBC
Granulocyte percent: 60.1 %G (ref 37–80)
HEMATOCRIT: 39.7 % — AB (ref 43.5–53.7)
HEMOGLOBIN: 13.5 g/dL — AB (ref 14.1–18.1)
Lymph, poc: 2.1 (ref 0.6–3.4)
MCH: 29.4 pg (ref 27–31.2)
MCHC: 34 g/dL (ref 31.8–35.4)
MCV: 86.4 fL (ref 80–97)
MPV: 8.6 fL (ref 0–99.8)
POC Granulocyte: 3.5 (ref 2–6.9)
POC LYMPH PERCENT: 35.7 %L (ref 10–50)
Platelet Count, POC: 119 10*3/uL — AB (ref 142–424)
RBC: 4.6 M/uL — AB (ref 4.69–6.13)
RDW, POC: 13.1 %
WBC: 5.9 10*3/uL (ref 4.6–10.2)

## 2014-04-29 DIAGNOSIS — D696 Thrombocytopenia, unspecified: Secondary | ICD-10-CM

## 2014-04-29 HISTORY — DX: Thrombocytopenia, unspecified: D69.6

## 2014-05-11 ENCOUNTER — Encounter (HOSPITAL_COMMUNITY): Payer: Self-pay

## 2014-05-11 ENCOUNTER — Encounter (HOSPITAL_COMMUNITY): Payer: Medicare Other | Attending: Hematology and Oncology

## 2014-05-11 VITALS — BP 123/60 | HR 69 | Temp 97.8°F | Resp 17 | Ht 73.0 in | Wt 170.0 lb

## 2014-05-11 DIAGNOSIS — E785 Hyperlipidemia, unspecified: Secondary | ICD-10-CM | POA: Insufficient documentation

## 2014-05-11 DIAGNOSIS — J449 Chronic obstructive pulmonary disease, unspecified: Secondary | ICD-10-CM | POA: Diagnosis not present

## 2014-05-11 DIAGNOSIS — H409 Unspecified glaucoma: Secondary | ICD-10-CM | POA: Diagnosis not present

## 2014-05-11 DIAGNOSIS — Z9109 Other allergy status, other than to drugs and biological substances: Secondary | ICD-10-CM | POA: Insufficient documentation

## 2014-05-11 DIAGNOSIS — E7800 Pure hypercholesterolemia, unspecified: Secondary | ICD-10-CM | POA: Insufficient documentation

## 2014-05-11 DIAGNOSIS — Z9889 Other specified postprocedural states: Secondary | ICD-10-CM | POA: Diagnosis not present

## 2014-05-11 DIAGNOSIS — Z7982 Long term (current) use of aspirin: Secondary | ICD-10-CM | POA: Diagnosis not present

## 2014-05-11 DIAGNOSIS — Z888 Allergy status to other drugs, medicaments and biological substances status: Secondary | ICD-10-CM | POA: Insufficient documentation

## 2014-05-11 DIAGNOSIS — Z79899 Other long term (current) drug therapy: Secondary | ICD-10-CM | POA: Insufficient documentation

## 2014-05-11 DIAGNOSIS — E1149 Type 2 diabetes mellitus with other diabetic neurological complication: Secondary | ICD-10-CM | POA: Diagnosis not present

## 2014-05-11 DIAGNOSIS — J438 Other emphysema: Secondary | ICD-10-CM

## 2014-05-11 DIAGNOSIS — Z833 Family history of diabetes mellitus: Secondary | ICD-10-CM | POA: Insufficient documentation

## 2014-05-11 DIAGNOSIS — I2510 Atherosclerotic heart disease of native coronary artery without angina pectoris: Secondary | ICD-10-CM | POA: Diagnosis not present

## 2014-05-11 DIAGNOSIS — Z8249 Family history of ischemic heart disease and other diseases of the circulatory system: Secondary | ICD-10-CM | POA: Insufficient documentation

## 2014-05-11 DIAGNOSIS — D696 Thrombocytopenia, unspecified: Secondary | ICD-10-CM

## 2014-05-11 DIAGNOSIS — E559 Vitamin D deficiency, unspecified: Secondary | ICD-10-CM | POA: Insufficient documentation

## 2014-05-11 DIAGNOSIS — E119 Type 2 diabetes mellitus without complications: Secondary | ICD-10-CM

## 2014-05-11 DIAGNOSIS — Z823 Family history of stroke: Secondary | ICD-10-CM | POA: Diagnosis not present

## 2014-05-11 DIAGNOSIS — E1142 Type 2 diabetes mellitus with diabetic polyneuropathy: Secondary | ICD-10-CM | POA: Insufficient documentation

## 2014-05-11 DIAGNOSIS — Z87891 Personal history of nicotine dependence: Secondary | ICD-10-CM | POA: Insufficient documentation

## 2014-05-11 DIAGNOSIS — I6529 Occlusion and stenosis of unspecified carotid artery: Secondary | ICD-10-CM | POA: Diagnosis not present

## 2014-05-11 DIAGNOSIS — I10 Essential (primary) hypertension: Secondary | ICD-10-CM | POA: Diagnosis not present

## 2014-05-11 DIAGNOSIS — J4489 Other specified chronic obstructive pulmonary disease: Secondary | ICD-10-CM | POA: Insufficient documentation

## 2014-05-11 DIAGNOSIS — I739 Peripheral vascular disease, unspecified: Secondary | ICD-10-CM | POA: Insufficient documentation

## 2014-05-11 DIAGNOSIS — Z8701 Personal history of pneumonia (recurrent): Secondary | ICD-10-CM | POA: Diagnosis not present

## 2014-05-11 LAB — COMPREHENSIVE METABOLIC PANEL
ALBUMIN: 4.4 g/dL (ref 3.5–5.2)
ALT: 19 U/L (ref 0–53)
AST: 25 U/L (ref 0–37)
Alkaline Phosphatase: 49 U/L (ref 39–117)
Anion gap: 12 (ref 5–15)
BUN: 17 mg/dL (ref 6–23)
CALCIUM: 9.5 mg/dL (ref 8.4–10.5)
CHLORIDE: 99 meq/L (ref 96–112)
CO2: 26 mEq/L (ref 19–32)
CREATININE: 1.05 mg/dL (ref 0.50–1.35)
GFR calc Af Amer: 81 mL/min — ABNORMAL LOW (ref >90)
GFR calc non Af Amer: 70 mL/min — ABNORMAL LOW (ref >90)
Glucose, Bld: 102 mg/dL — ABNORMAL HIGH (ref 70–99)
Potassium: 4.1 mEq/L (ref 3.7–5.3)
SODIUM: 137 meq/L (ref 137–147)
Total Bilirubin: 2.3 mg/dL — ABNORMAL HIGH (ref 0.3–1.2)
Total Protein: 7.2 g/dL (ref 6.0–8.3)

## 2014-05-11 LAB — CBC WITH DIFFERENTIAL/PLATELET
BASOS ABS: 0 10*3/uL (ref 0.0–0.1)
Basophils Relative: 1 % (ref 0–1)
EOS PCT: 2 % (ref 0–5)
Eosinophils Absolute: 0.2 10*3/uL (ref 0.0–0.7)
HCT: 40.7 % (ref 39.0–52.0)
Hemoglobin: 14.2 g/dL (ref 13.0–17.0)
LYMPHS PCT: 38 % (ref 12–46)
Lymphs Abs: 2.5 10*3/uL (ref 0.7–4.0)
MCH: 29.9 pg (ref 26.0–34.0)
MCHC: 34.9 g/dL (ref 30.0–36.0)
MCV: 85.7 fL (ref 78.0–100.0)
Monocytes Absolute: 0.5 10*3/uL (ref 0.1–1.0)
Monocytes Relative: 8 % (ref 3–12)
NEUTROS ABS: 3.4 10*3/uL (ref 1.7–7.7)
Neutrophils Relative %: 51 % (ref 43–77)
Platelets: 127 10*3/uL — ABNORMAL LOW (ref 150–400)
RBC: 4.75 MIL/uL (ref 4.22–5.81)
RDW: 13 % (ref 11.5–15.5)
WBC: 6.6 10*3/uL (ref 4.0–10.5)

## 2014-05-11 LAB — LACTATE DEHYDROGENASE: LDH: 256 U/L — AB (ref 94–250)

## 2014-05-11 MED ORDER — DULOXETINE HCL 30 MG PO CPEP: ORAL_CAPSULE | ORAL | Status: DC

## 2014-05-11 NOTE — Progress Notes (Signed)
Dean Harris presented for labwork. Labs per MD order drawn via Peripheral Line 23 gauge needle inserted in RT AC  Good blood return present. Procedure without incident.  Needle removed intact. Patient tolerated procedure well.

## 2014-05-11 NOTE — Progress Notes (Signed)
Juana Diaz A. Barnet Glasgow, M.D.  NEW PATIENT EVALUATION   Name: Dean Harris Date: 05/12/2014 MRN: [id number] DOB: [birth year]  PCP: [name], MD   REFERRING PHYSICIAN: Hassell Done, Mary-Margaret, *  REASON FOR REFERRAL: Thrombocytopenia     HISTORY OF PRESENT ILLNESS:Dean Harris is a 73 y.o. male who is referred by family physician for evaluation of thrombocytopenia. He does bruise easily but has been on aspirin for a while. He does have peripheral vascular disease having undergone bilateral carotid endarterectomies in the past. His primary complaint is bilateral lower extremity neuropathy. Appetite is good with no nausea, vomiting, easy satiety, fever, night sweats, lymphadenopathy, abdominal pain, diarrhea, constipation, dysuria, hematuria, reflux symptoms, joint pain, skin rash, sore throat, headache, or seizures.   PAST MEDICAL HISTORY:  has a past medical history of Pneumonia; High cholesterol; Diabetes mellitus; CAD (coronary artery disease); Hypertension; and Vitamin D deficiency.     PAST SURGICAL HISTORY: Past Surgical History  Procedure Laterality Date  . Carotid endarterectomy       CURRENT MEDICATIONS: has a current medication list which includes the following prescription(s): albuterol-ipratropium, aspirin, vitamin d, fluticasone-salmeterol, glucose blood, isosorbide mononitrate, lisinopril, metformin, simvastatin, travoprost (benzalkonium), and duloxetine.   ALLERGIES: Dust mite extract; Mold extract; Pollen extract; and Tetracyclines & related   SOCIAL HISTORY:  reports that he quit smoking about 26 years ago. His smoking use included Cigarettes. He smoked 0.00 packs per day. He does not have any smokeless tobacco history on file. He reports that he does not drink alcohol or use illicit drugs.   FAMILY HISTORY: family history includes Cancer in his father and other; Diabetes in his mother; Hyperlipidemia in  his mother; Hypertension in his brother and mother; Stroke in his mother.    REVIEW OF SYSTEMS:  Other than that discussed above is noncontributory.    PHYSICAL EXAM:  height is $RemoveB'6\' 1"'ppjbuzST$  (1.854 m) and weight is 170 lb (77.111 kg). His oral temperature is 97.8 F (36.6 C). His blood pressure is 123/60 and his pulse is 69. His respiration is 17 and oxygen saturation is 98%.    GENERAL:alert, no distress and comfortable SKIN: skin color, texture, turgor are normal, no rashes or significant lesions EYES: normal, Conjunctiva are pink and non-injected, sclera clear OROPHARYNX:no exudate, no erythema and lips, buccal mucosa, and tongue normal  NECK: supple, thyroid normal size, non-tender, without nodularity CHEST: Normal AP diameter with no breast masses. LYMPH:  no palpable lymphadenopathy in the cervical, axillary or inguinal LUNGS: clear to auscultation and percussion with normal breathing effort HEART: regular rate & rhythm and no murmurs ABDOMEN:abdomen soft, non-tender and normal bowel sounds. Liver and spleen not enlarged. MUSCULOSKELETALl:no cyanosis of digits, no clubbing or edema  NEURO: alert & oriented x 3 with fluent speech, no focal motor/sensory deficits. Decreased deep tendon reflexes bilaterally.    LABORATORY DATA:  Office Visit on 05/11/2014  Component Date Value Ref Range Status  . WBC 05/11/2014 6.6  4.0 - 10.5 K/uL Final  . RBC 05/11/2014 4.75  4.22 - 5.81 MIL/uL Final  . Hemoglobin 05/11/2014 14.2  13.0 - 17.0 g/dL Final  . HCT 05/11/2014 40.7  39.0 - 52.0 % Final  . MCV 05/11/2014 85.7  78.0 - 100.0 fL Final  . MCH 05/11/2014 29.9  26.0 - 34.0 pg Final  . MCHC 05/11/2014 34.9  30.0 - 36.0 g/dL Final  . RDW 05/11/2014 13.0  11.5 - 15.5 % Final  .  Platelets 05/11/2014 127* 150 - 400 K/uL Final  . Neutrophils Relative % 05/11/2014 51  43 - 77 % Final  . Neutro Abs 05/11/2014 3.4  1.7 - 7.7 K/uL Final  . Lymphocytes Relative 05/11/2014 38  12 - 46 % Final  . Lymphs  Abs 05/11/2014 2.5  0.7 - 4.0 K/uL Final  . Monocytes Relative 05/11/2014 8  3 - 12 % Final  . Monocytes Absolute 05/11/2014 0.5  0.1 - 1.0 K/uL Final  . Eosinophils Relative 05/11/2014 2  0 - 5 % Final  . Eosinophils Absolute 05/11/2014 0.2  0.0 - 0.7 K/uL Final  . Basophils Relative 05/11/2014 1  0 - 1 % Final  . Basophils Absolute 05/11/2014 0.0  0.0 - 0.1 K/uL Final  . Sodium 05/11/2014 137  137 - 147 mEq/L Final  . Potassium 05/11/2014 4.1  3.7 - 5.3 mEq/L Final  . Chloride 05/11/2014 99  96 - 112 mEq/L Final  . CO2 05/11/2014 26  19 - 32 mEq/L Final  . Glucose, Bld 05/11/2014 102* 70 - 99 mg/dL Final  . BUN 05/11/2014 17  6 - 23 mg/dL Final  . Creatinine, Ser 05/11/2014 1.05  0.50 - 1.35 mg/dL Final  . Calcium 05/11/2014 9.5  8.4 - 10.5 mg/dL Final  . Total Protein 05/11/2014 7.2  6.0 - 8.3 g/dL Final  . Albumin 05/11/2014 4.4  3.5 - 5.2 g/dL Final  . AST 05/11/2014 25  0 - 37 U/L Final  . ALT 05/11/2014 19  0 - 53 U/L Final  . Alkaline Phosphatase 05/11/2014 49  39 - 117 U/L Final  . Total Bilirubin 05/11/2014 2.3* 0.3 - 1.2 mg/dL Final  . GFR calc non Af Amer 05/11/2014 70* >90 mL/min Final  . GFR calc Af Amer 05/11/2014 81* >90 mL/min Final   Comment: (NOTE)                          The eGFR has been calculated using the CKD EPI equation.                          This calculation has not been validated in all clinical situations.                          eGFR's persistently <90 mL/min signify possible Chronic Kidney                          Disease.  . Anion gap 05/11/2014 12  5 - 15 Final  . LDH 05/11/2014 256* 94 - 250 U/L Final   SLIGHT HEMOLYSIS  . Vitamin B-12 05/11/2014 1001* 211 - 911 pg/mL Final   Performed at [facility]  . Folate 05/11/2014 >20.0   Final   Comment: (NOTE)                          Reference Ranges                                 Deficient:       0.4 - 3.3 ng/mL  Indeterminate:   3.4 - 5.4 ng/mL                                  Normal:              > 5.4 ng/mL                          Performed at [facility]  Lab on 04/25/2014  Component Date Value Ref Range Status  . WBC 04/25/2014 5.9  4.6 - 10.2 K/uL Final  . Lymph, poc 04/25/2014 2.1  0.6 - 3.4 Final  . POC LYMPH PERCENT 04/25/2014 35.7  10 - 50 %L Final  . POC Granulocyte 04/25/2014 3.5  2 - 6.9 Final  . Granulocyte percent 04/25/2014 60.1  37 - 80 %G Final  . RBC 04/25/2014 4.6* 4.69 - 6.13 M/uL Final  . Hemoglobin 04/25/2014 13.5* 14.1 - 18.1 g/dL Final  . HCT, POC 04/25/2014 39.7* 43.5 - 53.7 % Final  . MCV 04/25/2014 86.4  80 - 97 fL Final  . MCH, POC 04/25/2014 29.4  27 - 31.2 pg Final  . MCHC 04/25/2014 34.0  31.8 - 35.4 g/dL Final  . RDW, POC 04/25/2014 13.1   Final  . Platelet Count, POC 04/25/2014 119.0* 142 - 424 K/uL Final  . MPV 04/25/2014 8.6  0 - 99.8 fL Final    Urinalysis No results found for this basename: colorurine,  appearanceur,  labspec,  phurine,  glucoseu,  hgbur,  bilirubinur,  ketonesur,  proteinur,  urobilinogen,  nitrite,  leukocytesur      '@RADIOGRAPHY'$ : No results found.  PATHOLOGY: Peripheral smear failed to reveal evidence of platelet clumping.   IMPRESSION:  #1. 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 #2. 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 #3. Chronic obstructive pulmonary disease. $4. Coronary artery disease. #5. Hypertension, controlled. #6. Hyperlipidemia, on treatment. #7. Glaucoma   PLAN:  #1. Additional labs were done today to help elucidate the cause of thrombocytopenia. If unrevealing, bone marrow aspiration and biopsy will be arranged. #2. Trial of Cymbalta 36 mg at bedtime for diabetic neuropathy.  Doroteo Bradford, MD 05/12/2014 6:52 AM   DISCLAIMER:  This note was dictated with voice recognition softwre.  Similar sounding words can inadvertently be transcribed inaccurately and may not be  corrected upon review.

## 2014-05-11 NOTE — Patient Instructions (Signed)
[facility] Discharge Instructions  RECOMMENDATIONS MADE BY THE CONSULTANT AND ANY TEST RESULTS WILL BE SENT TO YOUR REFERRING PHYSICIAN.  EXAM FINDINGS BY THE PHYSICIAN TODAY AND SIGNS OR SYMPTOMS TO REPORT TO CLINIC OR PRIMARY PHYSICIAN: Exam and findings as discussed by Dr Zigmund Aikam.  Will check labs today and with if a definitive answer isn't obtained, bone marrow testing may be scheduled.    MEDICATIONS PRESCRIBED:  Cymbalta  at bedtime for 1 week.  If symptoms not improved, increase to  at bedtime for neuropathy.    INSTRUCTIONS/FOLLOW-UP: Follow-up in 2 weeks for labs and office visit.   Thank you for choosing [facility] to provide your oncology and hematology care.  To afford each patient quality time with our providers, please arrive at least 15 minutes before your scheduled appointment time.  With your help, our goal is to use those 15 minutes to complete the necessary work-up to ensure our physicians have the information they need to help with your evaluation and healthcare recommendations.    Effective January 1st, 2014, we ask that you re-schedule your appointment with our physicians should you arrive 10 or more minutes late for your appointment.  We strive to give you quality time with our providers, and arriving late affects you and other patients whose appointments are after yours.    Again, thank you for choosing [facility].  Our hope is that these requests will decrease the amount of time that you wait before being seen by our physicians.       _____________________________________________________________  Should you have questions after your visit to [facility], please contact our office at [phone] between the hours of 8:30 a.m. and 4:30 p.m.  Voicemails left after 4:30 p.m. will not be returned until the following business day.  For prescription refill requests, have your pharmacy contact our office  with your prescription refill request.    _______________________________________________________________  We hope that we have given you very good care.  You may receive a patient satisfaction survey in the mail, please complete it and return it as soon as possible.  We value your feedback!  _______________________________________________________________  Have you asked about our STAR program?  STAR stands for Survivorship Training and Rehabilitation, and this is a nationally recognized cancer care program that focuses on survivorship and rehabilitation.  Cancer and cancer treatments may cause problems, such as, pain, making you feel tired and keeping you from doing the things that you need or want to do. Cancer rehabilitation can help. Our goal is to reduce these troubling effects and help you have the best quality of life possible.  You may receive a survey from a nurse that asks questions about your current state of health.  Based on the survey results, all eligible patients will be referred to the [facility] program for an evaluation so we can better serve you!  A frequently asked questions sheet is available upon request.

## 2014-05-12 LAB — CARDIOLIPIN ANTIBODIES, IGG, IGM, IGA
Anticardiolipin IgA: 7 APL U/mL — ABNORMAL LOW (ref <22)
Anticardiolipin IgG: 4 GPL U/mL — ABNORMAL LOW (ref <23)
Anticardiolipin IgM: 0 MPL U/mL — ABNORMAL LOW (ref <11)

## 2014-05-12 LAB — VITAMIN B12: VITAMIN B 12: 1001 pg/mL — AB (ref 211–911)

## 2014-05-12 LAB — ANA: ANA: NEGATIVE

## 2014-05-12 LAB — FOLATE: Folate: >20 ng/mL

## 2014-05-19 ENCOUNTER — Other Ambulatory Visit (HOSPITAL_COMMUNITY): Payer: Self-pay

## 2014-05-23 ENCOUNTER — Other Ambulatory Visit (HOSPITAL_COMMUNITY): Payer: Self-pay

## 2014-05-23 DIAGNOSIS — I10 Essential (primary) hypertension: Secondary | ICD-10-CM

## 2014-05-24 ENCOUNTER — Encounter (HOSPITAL_COMMUNITY): Payer: Medicare Other

## 2014-05-24 ENCOUNTER — Encounter (HOSPITAL_COMMUNITY): Payer: Self-pay

## 2014-05-24 ENCOUNTER — Encounter (HOSPITAL_COMMUNITY): Payer: Medicare Other | Attending: Hematology and Oncology

## 2014-05-24 VITALS — BP 120/64 | HR 81 | Temp 98.0°F | Resp 18 | Wt 171.0 lb

## 2014-05-24 DIAGNOSIS — I6529 Occlusion and stenosis of unspecified carotid artery: Secondary | ICD-10-CM | POA: Diagnosis not present

## 2014-05-24 DIAGNOSIS — Z833 Family history of diabetes mellitus: Secondary | ICD-10-CM | POA: Insufficient documentation

## 2014-05-24 DIAGNOSIS — Z7982 Long term (current) use of aspirin: Secondary | ICD-10-CM | POA: Insufficient documentation

## 2014-05-24 DIAGNOSIS — J438 Other emphysema: Secondary | ICD-10-CM

## 2014-05-24 DIAGNOSIS — D696 Thrombocytopenia, unspecified: Secondary | ICD-10-CM | POA: Insufficient documentation

## 2014-05-24 DIAGNOSIS — E7800 Pure hypercholesterolemia, unspecified: Secondary | ICD-10-CM | POA: Insufficient documentation

## 2014-05-24 DIAGNOSIS — I2510 Atherosclerotic heart disease of native coronary artery without angina pectoris: Secondary | ICD-10-CM | POA: Diagnosis not present

## 2014-05-24 DIAGNOSIS — Z823 Family history of stroke: Secondary | ICD-10-CM | POA: Diagnosis not present

## 2014-05-24 DIAGNOSIS — Z888 Allergy status to other drugs, medicaments and biological substances status: Secondary | ICD-10-CM | POA: Diagnosis not present

## 2014-05-24 DIAGNOSIS — Z9109 Other allergy status, other than to drugs and biological substances: Secondary | ICD-10-CM | POA: Insufficient documentation

## 2014-05-24 DIAGNOSIS — E1142 Type 2 diabetes mellitus with diabetic polyneuropathy: Secondary | ICD-10-CM | POA: Diagnosis not present

## 2014-05-24 DIAGNOSIS — Z8701 Personal history of pneumonia (recurrent): Secondary | ICD-10-CM | POA: Diagnosis not present

## 2014-05-24 DIAGNOSIS — E119 Type 2 diabetes mellitus without complications: Secondary | ICD-10-CM

## 2014-05-24 DIAGNOSIS — J449 Chronic obstructive pulmonary disease, unspecified: Secondary | ICD-10-CM | POA: Diagnosis not present

## 2014-05-24 DIAGNOSIS — Z87891 Personal history of nicotine dependence: Secondary | ICD-10-CM | POA: Insufficient documentation

## 2014-05-24 DIAGNOSIS — Z8249 Family history of ischemic heart disease and other diseases of the circulatory system: Secondary | ICD-10-CM | POA: Insufficient documentation

## 2014-05-24 DIAGNOSIS — Z79899 Other long term (current) drug therapy: Secondary | ICD-10-CM | POA: Diagnosis not present

## 2014-05-24 DIAGNOSIS — I739 Peripheral vascular disease, unspecified: Secondary | ICD-10-CM

## 2014-05-24 DIAGNOSIS — H409 Unspecified glaucoma: Secondary | ICD-10-CM | POA: Diagnosis not present

## 2014-05-24 DIAGNOSIS — E1149 Type 2 diabetes mellitus with other diabetic neurological complication: Secondary | ICD-10-CM | POA: Insufficient documentation

## 2014-05-24 DIAGNOSIS — I10 Essential (primary) hypertension: Secondary | ICD-10-CM | POA: Insufficient documentation

## 2014-05-24 DIAGNOSIS — E785 Hyperlipidemia, unspecified: Secondary | ICD-10-CM | POA: Diagnosis not present

## 2014-05-24 DIAGNOSIS — Z9889 Other specified postprocedural states: Secondary | ICD-10-CM | POA: Diagnosis not present

## 2014-05-24 DIAGNOSIS — E559 Vitamin D deficiency, unspecified: Secondary | ICD-10-CM | POA: Diagnosis not present

## 2014-05-24 DIAGNOSIS — J4489 Other specified chronic obstructive pulmonary disease: Secondary | ICD-10-CM | POA: Insufficient documentation

## 2014-05-24 DIAGNOSIS — G629 Polyneuropathy, unspecified: Secondary | ICD-10-CM

## 2014-05-24 LAB — CBC WITH DIFFERENTIAL/PLATELET
BASOS ABS: 0 10*3/uL (ref 0.0–0.1)
BASOS PCT: 1 % (ref 0–1)
EOS ABS: 0.2 10*3/uL (ref 0.0–0.7)
EOS PCT: 3 % (ref 0–5)
HCT: 36.2 % — ABNORMAL LOW (ref 39.0–52.0)
Hemoglobin: 12.8 g/dL — ABNORMAL LOW (ref 13.0–17.0)
LYMPHS ABS: 2.1 10*3/uL (ref 0.7–4.0)
Lymphocytes Relative: 37 % (ref 12–46)
MCH: 29.6 pg (ref 26.0–34.0)
MCHC: 35.4 g/dL (ref 30.0–36.0)
MCV: 83.8 fL (ref 78.0–100.0)
Monocytes Absolute: 0.4 10*3/uL (ref 0.1–1.0)
Monocytes Relative: 6 % (ref 3–12)
NEUTROS PCT: 53 % (ref 43–77)
Neutro Abs: 3 10*3/uL (ref 1.7–7.7)
PLATELETS: 148 10*3/uL — AB (ref 150–400)
RBC: 4.32 MIL/uL (ref 4.22–5.81)
RDW: 13 % (ref 11.5–15.5)
WBC: 5.6 10*3/uL (ref 4.0–10.5)

## 2014-05-24 NOTE — Patient Instructions (Signed)
[facility] Discharge Instructions  RECOMMENDATIONS MADE BY THE CONSULTANT AND ANY TEST RESULTS WILL BE SENT TO YOUR REFERRING PHYSICIAN. We will see you in 6 months for follow up lab work and a doctor's appointment.   Thank you for choosing [facility] to provide your oncology and hematology care.  To afford each patient quality time with our providers, please arrive at least 15 minutes before your scheduled appointment time.  With your help, our goal is to use those 15 minutes to complete the necessary work-up to ensure our physicians have the information they need to help with your evaluation and healthcare recommendations.    Effective January 1st, 2014, we ask that you re-schedule your appointment with our physicians should you arrive 10 or more minutes late for your appointment.  We strive to give you quality time with our providers, and arriving late affects you and other patients whose appointments are after yours.    Again, thank you for choosing [facility].  Our hope is that these requests will decrease the amount of time that you wait before being seen by our physicians.       _____________________________________________________________  Should you have questions after your visit to [facility], please contact our office at [phone] between the hours of 8:30 a.m. and 4:30 p.m.  Voicemails left after 4:30 p.m. will not be returned until the following business day.  For prescription refill requests, have your pharmacy contact our office with your prescription refill request.    _______________________________________________________________  We hope that we have given you very good care.  You may receive a patient satisfaction survey in the mail, please complete it and return it as soon as possible.  We value your feedback!  _______________________________________________________________  Have you asked about our STAR  program?  STAR stands for Survivorship Training and Rehabilitation, and this is a nationally recognized cancer care program that focuses on survivorship and rehabilitation.  Cancer and cancer treatments may cause problems, such as, pain, making you feel tired and keeping you from doing the things that you need or want to do. Cancer rehabilitation can help. Our goal is to reduce these troubling effects and help you have the best quality of life possible.  You may receive a survey from a nurse that asks questions about your current state of health.  Based on the survey results, all eligible patients will be referred to the [facility] program for an evaluation so we can better serve you!  A frequently asked questions sheet is available upon request.

## 2014-05-24 NOTE — Progress Notes (Signed)
[place]  OFFICE PROGRESS NOTE  [name], [place] [address]  DIAGNOSIS: Thrombocytopenia, unspecified - Plan: CBC with Differential  Peripheral vascular disease  Other emphysema  Peripheral neuropathy  Chief Complaint  Patient presents with  . Thrombocytopenia    CURRENT THERAPY: Completed evaluation of thrombocytopenia.  INTERVAL HISTORY: Dean Harris 73 y.o. male returns for followup after additional testing to explain thrombocytopenia. He also suffers from peripheral neuropathy and was started on Cymbalta 30 mg at bedtime at the time of his last visit one platelet count was 127,000. Appetite is good with no nosebleed, melena, hematochezia, hematuria, or hemoptysis. He does bruise easily. He denies any fever, night sweats, easy satiety, lymphadenopathy, lower extremity swelling or redness, skin rash, headache, or seizures. After reading the potential side effects of Cymbalta, he failed to get the prescription filled. He is willing to live with his neuropathy.  MEDICAL HISTORY: Past Medical History  Diagnosis Date  . Pneumonia     x2  . High cholesterol   . Diabetes mellitus   . CAD (coronary artery disease)   . Hypertension   . Vitamin D deficiency     INTERIM HISTORY: has Sleep apnea; Gout; Type II or unspecified type diabetes mellitus without mention of complication, not stated as uncontrolled; Hyperlipidemia; Rosacea; COPD (chronic obstructive pulmonary disease); ED (erectile dysfunction); Peripheral neuropathy; CAD (coronary artery disease); Unstable angina; Essential hypertension, benign; and Thrombocytopenia, unspecified on his problem list.    ALLERGIES:  is allergic to dust mite extract; mold extract; pollen extract; and tetracyclines & related.  MEDICATIONS: has a current medication list which includes the following prescription(s): albuterol-ipratropium, aspirin, vitamin d, duloxetine,  fluticasone-salmeterol, glucose blood, isosorbide mononitrate, lisinopril, metformin, simvastatin, and travoprost (benzalkonium).  SURGICAL HISTORY:  Past Surgical History  Procedure Laterality Date  . Carotid endarterectomy      FAMILY HISTORY: family history includes Cancer in his father and other; Diabetes in his mother; Hyperlipidemia in his mother; Hypertension in his brother and mother; Stroke in his mother.  SOCIAL HISTORY:  reports that he quit smoking about 26 years ago. His smoking use included Cigarettes. He smoked 0.00 packs per day. He does not have any smokeless tobacco history on file. He reports that he does not drink alcohol or use illicit drugs.  REVIEW OF SYSTEMS:  Other than that discussed above is noncontributory.  PHYSICAL EXAMINATION: ECOG PERFORMANCE STATUS: 0 - Asymptomatic  Blood pressure 120/64, pulse 81, temperature 98 F (36.7 C), temperature source Oral, resp. rate 18, weight 171 lb (77.565 kg), SpO2 100.00%.  GENERAL:alert, no distress and comfortable SKIN: skin color, texture, turgor are normal, no rashes or significant lesions. No ecchymoses. EYES: PERLA; Conjunctiva are pink and non-injected, sclera clear SINUSES: No redness or tenderness over maxillary or ethmoid sinuses OROPHARYNX:no exudate, no erythema on lips, buccal mucosa, or tongue. NECK: supple, thyroid normal size, non-tender, without nodularity. No masses CHEST: Normal AP diameter with no breast masses. LYMPH:  no palpable lymphadenopathy in the cervical, axillary or inguinal LUNGS: clear to auscultation and percussion with normal breathing effort HEART: regular rate & rhythm and no murmurs. ABDOMEN:abdomen soft, non-tender and normal bowel sounds MUSCULOSKELETAL:no cyanosis of digits and no clubbing. Range of motion normal.  NEURO: alert & oriented x 3 with fluent speech, no focal motor/sensory deficits. Bilateral decreased deep tendon reflexes.   LABORATORY DATA: Appointment on  05/24/2014  Component Date Value Ref Range Status  .  WBC 05/24/2014 5.6  4.0 - 10.5 K/uL Final  . RBC 05/24/2014 4.32  4.22 - 5.81 MIL/uL Final  . Hemoglobin 05/24/2014 12.8* 13.0 - 17.0 g/dL Final  . HCT 05/24/2014 36.2* 39.0 - 52.0 % Final  . MCV 05/24/2014 83.8  78.0 - 100.0 fL Final  . MCH 05/24/2014 29.6  26.0 - 34.0 pg Final  . MCHC 05/24/2014 35.4  30.0 - 36.0 g/dL Final  . RDW 05/24/2014 13.0  11.5 - 15.5 % Final  . Platelets 05/24/2014 148* 150 - 400 K/uL Final  . Neutrophils Relative % 05/24/2014 53  43 - 77 % Final  . Neutro Abs 05/24/2014 3.0  1.7 - 7.7 K/uL Final  . Lymphocytes Relative 05/24/2014 37  12 - 46 % Final  . Lymphs Abs 05/24/2014 2.1  0.7 - 4.0 K/uL Final  . Monocytes Relative 05/24/2014 6  3 - 12 % Final  . Monocytes Absolute 05/24/2014 0.4  0.1 - 1.0 K/uL Final  . Eosinophils Relative 05/24/2014 3  0 - 5 % Final  . Eosinophils Absolute 05/24/2014 0.2  0.0 - 0.7 K/uL Final  . Basophils Relative 05/24/2014 1  0 - 1 % Final  . Basophils Absolute 05/24/2014 0.0  0.0 - 0.1 K/uL Final  Office Visit on 05/11/2014  Component Date Value Ref Range Status  . WBC 05/11/2014 6.6  4.0 - 10.5 K/uL Final  . RBC 05/11/2014 4.75  4.22 - 5.81 MIL/uL Final  . Hemoglobin 05/11/2014 14.2  13.0 - 17.0 g/dL Final  . HCT 05/11/2014 40.7  39.0 - 52.0 % Final  . MCV 05/11/2014 85.7  78.0 - 100.0 fL Final  . MCH 05/11/2014 29.9  26.0 - 34.0 pg Final  . MCHC 05/11/2014 34.9  30.0 - 36.0 g/dL Final  . RDW 05/11/2014 13.0  11.5 - 15.5 % Final  . Platelets 05/11/2014 127* 150 - 400 K/uL Final  . Neutrophils Relative % 05/11/2014 51  43 - 77 % Final  . Neutro Abs 05/11/2014 3.4  1.7 - 7.7 K/uL Final  . Lymphocytes Relative 05/11/2014 38  12 - 46 % Final  . Lymphs Abs 05/11/2014 2.5  0.7 - 4.0 K/uL Final  . Monocytes Relative 05/11/2014 8  3 - 12 % Final  . Monocytes Absolute 05/11/2014 0.5  0.1 - 1.0 K/uL Final  . Eosinophils Relative 05/11/2014 2  0 - 5 % Final  . Eosinophils  Absolute 05/11/2014 0.2  0.0 - 0.7 K/uL Final  . Basophils Relative 05/11/2014 1  0 - 1 % Final  . Basophils Absolute 05/11/2014 0.0  0.0 - 0.1 K/uL Final  . Sodium 05/11/2014 137  137 - 147 mEq/L Final  . Potassium 05/11/2014 4.1  3.7 - 5.3 mEq/L Final  . Chloride 05/11/2014 99  96 - 112 mEq/L Final  . CO2 05/11/2014 26  19 - 32 mEq/L Final  . Glucose, Bld 05/11/2014 102* 70 - 99 mg/dL Final  . BUN 05/11/2014 17  6 - 23 mg/dL Final  . Creatinine, Ser 05/11/2014 1.05  0.50 - 1.35 mg/dL Final  . Calcium 05/11/2014 9.5  8.4 - 10.5 mg/dL Final  . Total Protein 05/11/2014 7.2  6.0 - 8.3 g/dL Final  . Albumin 05/11/2014 4.4  3.5 - 5.2 g/dL Final  . AST 05/11/2014 25  0 - 37 U/L Final  . ALT 05/11/2014 19  0 - 53 U/L Final  . Alkaline Phosphatase 05/11/2014 49  39 - 117 U/L Final  . Total Bilirubin 05/11/2014 2.3* 0.3 -  1.2 mg/dL Final  . GFR calc non Af Amer 05/11/2014 70* >90 mL/min Final  . GFR calc Af Amer 05/11/2014 81* >90 mL/min Final   Comment: (NOTE)                          The eGFR has been calculated using the CKD EPI equation.                          This calculation has not been validated in all clinical situations.                          eGFR's persistently <90 mL/min signify possible Chronic Kidney                          Disease.  . Anion gap 05/11/2014 12  5 - 15 Final  . LDH 05/11/2014 256* 94 - 250 U/L Final   SLIGHT HEMOLYSIS  . Vitamin B-12 05/11/2014 1001* 211 - 911 pg/mL Final   Performed at [facility]  . Folate 05/11/2014 >20.0   Final   Comment: (NOTE)                          Reference Ranges                                 Deficient:       0.4 - 3.3 ng/mL                                 Indeterminate:   3.4 - 5.4 ng/mL                                 Normal:              > 5.4 ng/mL                          Performed at [facility]  . ANA 05/11/2014 NEGATIVE  NEGATIVE Final   Performed at [facility]  . Anticardiolipin IgG  05/11/2014 4* <23 GPL U/mL Final  . Anticardiolipin IgM 05/11/2014 0* <11 MPL U/mL Final  . Anticardiolipin IgA 05/11/2014 7* <22 APL U/mL Final   Comment: (NOTE)                          Reference Range:  Cardiolipin IgG                            Normal                  <23                            Low Positive (+)        23-35                            Moderate Positive (+)   36-50  High Positive (+)       >50                          Reference Range:  Cardiolipin IgM                            Normal                  <11                            Low Positive (+)        11-20                            Moderate Positive (+)   21-30                            High Positive (+)       >30                          Reference Range:  Cardiolipin IgA                            Normal                  <22                            Low Positive (+)        22-35                            Moderate Positive (+)   36-45                            High Positive (+)       >45                          Performed at [facility]  Lab on 04/25/2014  Component Date Value Ref Range Status  . WBC 04/25/2014 5.9  4.6 - 10.2 K/uL Final  . Lymph, poc 04/25/2014 2.1  0.6 - 3.4 Final  . POC LYMPH PERCENT 04/25/2014 35.7  10 - 50 %L Final  . POC Granulocyte 04/25/2014 3.5  2 - 6.9 Final  . Granulocyte percent 04/25/2014 60.1  37 - 80 %G Final  . RBC 04/25/2014 4.6* 4.69 - 6.13 M/uL Final  . Hemoglobin 04/25/2014 13.5* 14.1 - 18.1 g/dL Final  . HCT, POC 31/75/1285 39.7* 43.5 - 53.7 % Final  . MCV 04/25/2014 86.4  80 - 97 fL Final  . MCH, POC 04/25/2014 29.4  27 - 31.2 pg Final  . MCHC 04/25/2014 34.0  31.8 - 35.4 g/dL Final  . RDW, POC 05/44/8751 13.1   Final  . Platelet Count, POC 04/25/2014 119.0* 142 - 424 K/uL Final  . MPV 04/25/2014 8.6  0 - 99.8 fL Final    PATHOLOGY: No evidence of platelet clumping on peripheral smear.  Urinalysis No results found for  this basename: colorurine,  appearanceur,  labspec,  phurine,  glucoseu,  hgbur,  bilirubinur,  ketonesur,  proteinur,  urobilinogen,  nitrite,  leukocytesur    RADIOGRAPHIC STUDIES: No results found.  ASSESSMENT:  #1. 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 #2. 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  #3. Chronic obstructive pulmonary disease.  $4. Coronary artery disease.  #5. Hypertension, controlled.  #6. Hyperlipidemia, on treatment.  #7. Glaucoma   PLAN:  #1. No additional therapy is necessary at this time. #2. Return if develops epistaxis, severe bruising, or the development of petechiae. #3. Followup in 6 months with CBC.   All questions were answered. The patient knows to call the clinic with any problems, questions or concerns. We can certainly see the patient much sooner if necessary.   I spent 25 minutes counseling the patient face to face. The total time spent in the appointment was 30 minutes.    Doroteo Bradford, MD 05/24/2014 7:11 PM  DISCLAIMER:  This note was dictated with voice recognition software.  Similar sounding words can inadvertently be transcribed inaccurately and may not be corrected upon review.

## 2014-05-24 NOTE — Progress Notes (Signed)
Dean Harris presented for labwork. Labs per MD order drawn via Peripheral Line 21 gauge needle inserted in left upper forearm.  Good blood return present. Procedure without incident.  Needle removed intact. Patient tolerated procedure well.

## 2014-06-27 ENCOUNTER — Telehealth: Payer: Self-pay | Admitting: Nurse Practitioner

## 2014-06-27 DIAGNOSIS — I10 Essential (primary) hypertension: Secondary | ICD-10-CM

## 2014-06-27 DIAGNOSIS — E785 Hyperlipidemia, unspecified: Secondary | ICD-10-CM

## 2014-06-27 NOTE — Telephone Encounter (Signed)
Orders have been put in to come in for labs prior to appointment- if wait until day prior the results may not be back by appointment

## 2014-06-28 NOTE — Telephone Encounter (Signed)
Patient aware.

## 2014-07-06 ENCOUNTER — Ambulatory Visit: Payer: Medicare Other | Admitting: Nurse Practitioner

## 2014-07-25 ENCOUNTER — Other Ambulatory Visit: Payer: Medicare Other

## 2014-07-25 DIAGNOSIS — I10 Essential (primary) hypertension: Secondary | ICD-10-CM

## 2014-07-25 DIAGNOSIS — E785 Hyperlipidemia, unspecified: Secondary | ICD-10-CM

## 2014-07-26 ENCOUNTER — Ambulatory Visit (INDEPENDENT_AMBULATORY_CARE_PROVIDER_SITE_OTHER): Payer: Medicare Other | Admitting: *Deleted

## 2014-07-26 ENCOUNTER — Ambulatory Visit (INDEPENDENT_AMBULATORY_CARE_PROVIDER_SITE_OTHER): Payer: Medicare Other | Admitting: Nurse Practitioner

## 2014-07-26 ENCOUNTER — Encounter: Payer: Self-pay | Admitting: Nurse Practitioner

## 2014-07-26 ENCOUNTER — Ambulatory Visit: Payer: Medicare Other | Admitting: Nurse Practitioner

## 2014-07-26 VITALS — BP 110/69 | HR 76 | Temp 97.1°F | Ht >= 80 in | Wt 166.8 lb

## 2014-07-26 DIAGNOSIS — E119 Type 2 diabetes mellitus without complications: Secondary | ICD-10-CM

## 2014-07-26 DIAGNOSIS — E785 Hyperlipidemia, unspecified: Secondary | ICD-10-CM

## 2014-07-26 DIAGNOSIS — IMO0001 Reserved for inherently not codable concepts without codable children: Secondary | ICD-10-CM

## 2014-07-26 DIAGNOSIS — Z23 Encounter for immunization: Secondary | ICD-10-CM

## 2014-07-26 DIAGNOSIS — J449 Chronic obstructive pulmonary disease, unspecified: Secondary | ICD-10-CM

## 2014-07-26 DIAGNOSIS — I10 Essential (primary) hypertension: Secondary | ICD-10-CM

## 2014-07-26 LAB — CMP14+EGFR
A/G RATIO: 2 (ref 1.1–2.5)
ALK PHOS: 50 IU/L (ref 39–117)
ALT: 19 IU/L (ref 0–44)
AST: 17 IU/L (ref 0–40)
Albumin: 4.3 g/dL (ref 3.5–4.8)
BUN/Creatinine Ratio: 15 (ref 10–22)
BUN: 18 mg/dL (ref 8–27)
CALCIUM: 9.4 mg/dL (ref 8.6–10.2)
CO2: 25 mmol/L (ref 18–29)
Chloride: 100 mmol/L (ref 97–108)
Creatinine, Ser: 1.22 mg/dL (ref 0.76–1.27)
GFR calc Af Amer: 69 mL/min/{1.73_m2} (ref >59)
GFR calc non Af Amer: 59 mL/min/{1.73_m2} — ABNORMAL LOW (ref >59)
Globulin, Total: 2.1 g/dL (ref 1.5–4.5)
Glucose: 113 mg/dL — ABNORMAL HIGH (ref 65–99)
Potassium: 4.8 mmol/L (ref 3.5–5.2)
Sodium: 140 mmol/L (ref 134–144)
Total Bilirubin: 1.7 mg/dL — ABNORMAL HIGH (ref 0.0–1.2)
Total Protein: 6.4 g/dL (ref 6.0–8.5)

## 2014-07-26 LAB — NMR, LIPOPROFILE
Cholesterol: 133 mg/dL (ref 100–199)
HDL CHOLESTEROL BY NMR: 44 mg/dL (ref >39)
HDL Particle Number: 33.2 umol/L (ref >=30.5)
LDL Particle Number: 839 nmol/L (ref <1000)
LDL Size: 21.2 nm (ref >20.5)
LDL-C: 69 mg/dL (ref 0–99)
LP-IR SCORE: 64 — AB (ref <=45)
SMALL LDL PARTICLE NUMBER: 306 nmol/L (ref <=527)
Triglycerides by NMR: 98 mg/dL (ref 0–149)

## 2014-07-26 LAB — POCT GLYCOSYLATED HEMOGLOBIN (HGB A1C)

## 2014-07-26 MED ORDER — ISOSORBIDE MONONITRATE ER 30 MG PO TB24: 30.0000 mg | ORAL_TABLET | Freq: Every day | ORAL | Status: DC

## 2014-07-26 MED ORDER — FLUTICASONE-SALMETEROL 100-50 MCG/DOSE IN AEPB: 1.0000 | INHALATION_SPRAY | Freq: Two times a day (BID) | RESPIRATORY_TRACT | Status: DC

## 2014-07-26 MED ORDER — METFORMIN HCL 500 MG PO TABS: 250.0000 mg | ORAL_TABLET | Freq: Two times a day (BID) | ORAL | Status: DC

## 2014-07-26 MED ORDER — LISINOPRIL 10 MG PO TABS: 10.0000 mg | ORAL_TABLET | Freq: Every day | ORAL | Status: DC

## 2014-07-26 MED ORDER — SIMVASTATIN 40 MG PO TABS: 40.0000 mg | ORAL_TABLET | Freq: Every evening | ORAL | Status: DC

## 2014-07-26 MED ORDER — IPRATROPIUM-ALBUTEROL 18-103 MCG/ACT IN AERO: 1.0000 | INHALATION_SPRAY | Freq: Four times a day (QID) | RESPIRATORY_TRACT | Status: DC | PRN

## 2014-07-26 NOTE — Progress Notes (Signed)
Subjective:    Patient ID: Dean Harris, male    DOB: [birth year], 73 y.o.   MRN: [id number]  Patient is here today for his annual physical. No complaints.   Diabetes He presents for his follow-up diabetic visit. He has type 2 diabetes mellitus. No MedicAlert identification noted. His disease course has been stable. Pertinent negatives for hypoglycemia include no confusion, dizziness or headaches. Pertinent negatives for diabetes include no chest pain, no fatigue, no polydipsia, no polyphagia, no polyuria and no visual change. Symptoms are stable. Risk factors for coronary artery disease include male sex, post-menopausal, dyslipidemia and diabetes mellitus. Current diabetic treatment includes oral agent (monotherapy). He is compliant with treatment all of the time. He does not see a podiatrist.Eye exam is current.  Hyperlipidemia This is a chronic problem. The current episode started more than 1 year ago. The problem is controlled. Recent lipid tests were reviewed and are normal. Pertinent negatives include no chest pain or shortness of breath. Current antihyperlipidemic treatment includes statins. Risk factors for coronary artery disease include male sex, hypertension, dyslipidemia and diabetes mellitus.  Hypertension This is a chronic problem. The current episode started more than 1 year ago. The problem is unchanged. The problem is controlled. Pertinent negatives include no chest pain, headaches, palpitations or shortness of breath. Risk factors for coronary artery disease include dyslipidemia, male gender and diabetes mellitus. Past treatments include diuretics. There are no compliance problems.   Asthma Combivent PRN, . Angina Imdur- patient is not seeing a cardiologist currently.     Review of Systems  Constitutional: Negative for fatigue.  Respiratory: Negative for shortness of breath.   Cardiovascular: Negative for chest pain and palpitations.  Endocrine: Negative for polydipsia,  polyphagia and polyuria.  Neurological: Negative for dizziness and headaches.  Psychiatric/Behavioral: Negative for confusion.  All other systems reviewed and are negative.      Objective:   Physical Exam  Constitutional: He is oriented to person, place, and time. He appears well-developed and well-nourished.  HENT:  Head: Normocephalic.  Eyes: Conjunctivae are normal. Pupils are equal, round, and reactive to light.  Cardiovascular: Normal rate and regular rhythm.   Pulmonary/Chest: Effort normal and breath sounds normal. No respiratory distress. He has no wheezes. He has no rales. He exhibits no tenderness.  Musculoskeletal: Normal range of motion.  Neurological: He is alert and oriented to person, place, and time. He has normal reflexes.  Skin: Skin is warm.  Psychiatric: He has a normal mood and affect. His behavior is normal. Judgment and thought content normal.   BP 110/69 mmHg  Pulse 76  Temp(Src) 97.1 F (36.2 C) (Oral)  Ht 7\' 2"  (2.184 m)  Wt 166 lb 12.8 oz (75.66 kg)  BMI 15.86 kg/m2  Results for orders placed or performed in visit on 07/26/14  POCT glycosylated hemoglobin (Hb A1C)  Result Value Ref Range   Hemoglobin A1C 6.0%         Assessment & Plan:   1. Hyperlipidemia Low fat diet - simvastatin (ZOCOR) 40 MG tablet; Take 1 tablet (40 mg total) by mouth every evening.  Dispense: 30 tablet; Refill: 5  2. Type 2 diabetes mellitus without complication Monitor carb intake - POCT glycosylated hemoglobin (Hb A1C) - metFORMIN (GLUCOPHAGE) 500 MG tablet; Take 0.5 tablets (250 mg total) by mouth 2 (two) times daily with a meal.  Dispense: 60 tablet; Refill: 5  3. Essential hypertension Low salt diet - isosorbide mononitrate (IMDUR) 30 MG 24 hr tablet; Take 1 tablet (30  mg total) by mouth daily.  Dispense: 30 tablet; Refill: 5 - lisinopril (PRINIVIL,ZESTRIL) 10 MG tablet; Take 1 tablet (10 mg total) by mouth daily.  Dispense: 30 tablet; Refill: 5  4. COPD  bronchitis - albuterol-ipratropium (COMBIVENT) 18-103 MCG/ACT inhaler; Inhale 1 puff into the lungs every 6 (six) hours as needed.  Dispense: 1 Inhaler; Refill: 5 - Fluticasone-Salmeterol (ADVAIR DISKUS) 100-50 MCG/DOSE AEPB; Inhale 1 puff into the lungs every 12 (twelve) hours.  Dispense: 60 each; Refill: 5   hemoccult cards given to patient with directions Labs pending Health maintenance reviewed Diet and exercise encouraged Continue all meds Follow up  In 3 months PRN   Mary-Margaret Daphine DeutscherMartin, FNP

## 2014-07-26 NOTE — Patient Instructions (Signed)
Diabetes and Exercise Exercising regularly is important. It is not just about losing weight. It has many health benefits, such as:  Improving your overall fitness, flexibility, and endurance.  Increasing your bone density.  Helping with weight control.  Decreasing your body fat.  Increasing your muscle strength.  Reducing stress and tension.  Improving your overall health. People with diabetes who exercise gain additional benefits because exercise:  Reduces appetite.  Improves the body's use of blood sugar (glucose).  Helps lower or control blood glucose.  Decreases blood pressure.  Helps control blood lipids (such as cholesterol and triglycerides).  Improves the body's use of the hormone insulin by:  Increasing the body's insulin sensitivity.  Reducing the body's insulin needs.  Decreases the risk for heart disease because exercising:  Lowers cholesterol and triglycerides levels.  Increases the levels of good cholesterol (such as high-density lipoproteins [HDL]) in the body.  Lowers blood glucose levels. YOUR ACTIVITY PLAN  Choose an activity that you enjoy and set realistic goals. Your health care provider or diabetes educator can help you make an activity plan that works for you. Exercise regularly as directed by your health care provider. This includes:  Performing resistance training twice a week such as push-ups, sit-ups, lifting weights, or using resistance bands.  Performing 150 minutes of cardio exercises each week such as walking, running, or playing sports.  Staying active and spending no more than 90 minutes at one time being inactive. Even short bursts of exercise are good for you. Three 10-minute sessions spread throughout the day are just as beneficial as a single 30-minute session. Some exercise ideas include:  Taking the dog for a walk.  Taking the stairs instead of the elevator.  Dancing to your favorite song.  Doing an exercise  video.  Doing your favorite exercise with a friend. RECOMMENDATIONS FOR EXERCISING WITH TYPE 1 OR TYPE 2 DIABETES   Check your blood glucose before exercising. If blood glucose levels are greater than 240 mg/dL, check for urine ketones. Do not exercise if ketones are present.  Avoid injecting insulin into areas of the body that are going to be exercised. For example, avoid injecting insulin into:  The arms when playing tennis.  The legs when jogging.  Keep a record of:  Food intake before and after you exercise.  Expected peak times of insulin action.  Blood glucose levels before and after you exercise.  The type and amount of exercise you have done.  Review your records with your health care provider. Your health care provider will help you to develop guidelines for adjusting food intake and insulin amounts before and after exercising.  If you take insulin or oral hypoglycemic agents, watch for signs and symptoms of hypoglycemia. They include:  Dizziness.  Shaking.  Sweating.  Chills.  Confusion.  Drink plenty of water while you exercise to prevent dehydration or heat stroke. Body water is lost during exercise and must be replaced.  Talk to your health care provider before starting an exercise program to make sure it is safe for you. Remember, almost any type of activity is better than none. Document Released: 11/22/2003 Document Revised: 01/16/2014 Document Reviewed: 02/08/2013 ExitCare Patient Information 2015 [employer]. This information is not intended to replace advice given to you by your health care provider. Make sure you discuss any questions you have with your health care provider.  

## 2014-08-24 ENCOUNTER — Encounter (HOSPITAL_COMMUNITY): Payer: Self-pay | Admitting: Cardiology

## 2014-09-22 ENCOUNTER — Encounter: Payer: Self-pay | Admitting: *Deleted

## 2014-09-22 LAB — HM DIABETES EYE EXAM

## 2014-10-28 ENCOUNTER — Ambulatory Visit (INDEPENDENT_AMBULATORY_CARE_PROVIDER_SITE_OTHER): Payer: Medicare Other | Admitting: Nurse Practitioner

## 2014-10-28 ENCOUNTER — Ambulatory Visit: Payer: Self-pay

## 2014-10-28 VITALS — BP 124/66 | HR 74 | Temp 96.8°F | Ht 63.0 in | Wt 167.8 lb

## 2014-10-28 DIAGNOSIS — E785 Hyperlipidemia, unspecified: Secondary | ICD-10-CM

## 2014-10-28 DIAGNOSIS — I10 Essential (primary) hypertension: Secondary | ICD-10-CM

## 2014-10-28 DIAGNOSIS — E119 Type 2 diabetes mellitus without complications: Secondary | ICD-10-CM

## 2014-10-28 DIAGNOSIS — G629 Polyneuropathy, unspecified: Secondary | ICD-10-CM

## 2014-10-28 DIAGNOSIS — Z0000 Encounter for general adult medical examination without abnormal findings: Secondary | ICD-10-CM

## 2014-10-28 DIAGNOSIS — I2510 Atherosclerotic heart disease of native coronary artery without angina pectoris: Secondary | ICD-10-CM

## 2014-10-28 LAB — POCT UA - MICROALBUMIN: MICROALBUMIN (UR) POC: NEGATIVE mg/L

## 2014-10-28 LAB — POCT GLYCOSYLATED HEMOGLOBIN (HGB A1C): Hemoglobin A1C: 6.4

## 2014-10-28 NOTE — Addendum Note (Signed)
Addended by: Lisbeth PlyMOORE, Dalores Weger C on: 10/28/2014 10:02 AM   Modules accepted: Orders

## 2014-10-28 NOTE — Patient Instructions (Signed)
Exercise to Stay Healthy Exercise helps you become and stay healthy. EXERCISE IDEAS AND TIPS Choose exercises that:  You enjoy.  Fit into your day. You do not need to exercise really hard to be healthy. You can do exercises at a slow or medium level and stay healthy. You can:  Stretch before and after working out.  Try yoga, Pilates, or tai chi.  Lift weights.  Walk fast, swim, jog, run, climb stairs, bicycle, dance, or rollerskate.  Take aerobic classes. Exercises that burn about 150 calories:  Running 1  miles in 15 minutes.  Playing volleyball for 45 to 60 minutes.  Washing and waxing a car for 45 to 60 minutes.  Playing touch football for 45 minutes.  Walking 1  miles in 35 minutes.  Pushing a stroller 1  miles in 30 minutes.  Playing basketball for 30 minutes.  Raking leaves for 30 minutes.  Bicycling 5 miles in 30 minutes.  Walking 2 miles in 30 minutes.  Dancing for 30 minutes.  Shoveling snow for 15 minutes.  Swimming laps for 20 minutes.  Walking up stairs for 15 minutes.  Bicycling 4 miles in 15 minutes.  Gardening for 30 to 45 minutes.  Jumping rope for 15 minutes.  Washing windows or floors for 45 to 60 minutes. Document Released: 10/04/2010 Document Revised: 11/24/2011 Document Reviewed: 10/04/2010 ExitCare Patient Information 2015 [employer]. This information is not intended to replace advice given to you by your health care provider. Make sure you discuss any questions you have with your health care provider.  

## 2014-10-28 NOTE — Progress Notes (Signed)
Subjective:    Patient ID: Dean Harris, male    DOB: 1940-12-09, 74 y.o.   MRN: [id number]  Patient is here today for his annual physical. No complaints.   Hyperlipidemia This is a chronic problem. The current episode started more than 1 year ago. The problem is controlled. Recent lipid tests were reviewed and are normal. Pertinent negatives include no chest pain or shortness of breath. Current antihyperlipidemic treatment includes statins. Risk factors for coronary artery disease include male sex, hypertension, dyslipidemia and diabetes mellitus.  Diabetes He presents for his follow-up diabetic visit. He has type 2 diabetes mellitus. No MedicAlert identification noted. His disease course has been stable. Pertinent negatives for hypoglycemia include no confusion, dizziness or headaches. Pertinent negatives for diabetes include no chest pain, no fatigue, no polydipsia, no polyphagia, no polyuria and no visual change. Symptoms are stable. Risk factors for coronary artery disease include male sex, post-menopausal, dyslipidemia and diabetes mellitus. Current diabetic treatment includes oral agent (monotherapy). He is compliant with treatment all of the time. He does not see a podiatrist.Eye exam is current.  Hypertension This is a chronic problem. The current episode started more than 1 year ago. The problem is unchanged. The problem is controlled. Pertinent negatives include no chest pain, headaches, palpitations or shortness of breath. Risk factors for coronary artery disease include dyslipidemia, male gender and diabetes mellitus. Past treatments include diuretics. There are no compliance problems.   Asthma Combivent PRN, . Angina Imdur- patient is not seeing a cardiologist currently.     Review of Systems  Constitutional: Negative for fatigue.  Respiratory: Negative for shortness of breath.   Cardiovascular: Negative for chest pain and palpitations.  Endocrine: Negative for polydipsia,  polyphagia and polyuria.  Neurological: Negative for dizziness and headaches.  Psychiatric/Behavioral: Negative for confusion.  All other systems reviewed and are negative.      Objective:   Physical Exam  Constitutional: He is oriented to person, place, and time. He appears well-developed and well-nourished.  HENT:  Head: Normocephalic.  Eyes: Conjunctivae are normal. Pupils are equal, round, and reactive to light.  Cardiovascular: Normal rate and regular rhythm.   Pulmonary/Chest: Effort normal and breath sounds normal. No respiratory distress. He has no wheezes. He has no rales. He exhibits no tenderness.  Musculoskeletal: Normal range of motion.  Neurological: He is alert and oriented to person, place, and time. He has normal reflexes.  Skin: Skin is warm.  Psychiatric: He has a normal mood and affect. His behavior is normal. Judgment and thought content normal.   BP 124/66 mmHg  Pulse 74  Temp(Src) 96.8 F (36 C) (Oral)  Ht _0  (1.6 m)  Wt 167 lb 12.8 oz (76.114 kg)  BMI 29.73 kg/m2  Results for orders placed or performed in visit on 10/28/14  POCT glycosylated hemoglobin (Hb A1C)  Result Value Ref Range   Hemoglobin A1C 6.4%           Assessment & Plan:   1. Essential hypertension, benign Do not add slat to diet - CMP14+EGFR  2. Coronary artery disease involving native coronary artery of native heart without angina pectoris  3. Type 2 diabetes mellitus without complication Continue to count carbs - POCT glycosylated hemoglobin (Hb A1C)  4. Peripheral neuropathy Wear shoes- do not go barefooted  5. Hyperlipidemia Low fat diet - NMR, lipoprofile    Labs pending Health maintenance reviewed Diet and exercise encouraged Continue all meds Follow up  In 3 month   Mary-Margaret Hassell Done,  FNP    

## 2014-10-30 ENCOUNTER — Ambulatory Visit: Payer: Medicare Other | Admitting: Nurse Practitioner

## 2014-10-31 LAB — CMP14+EGFR
ALT: 21 IU/L (ref 0–44)
AST: 22 IU/L (ref 0–40)
Albumin/Globulin Ratio: 2 (ref 1.1–2.5)
Albumin: 4.5 g/dL (ref 3.5–4.8)
Alkaline Phosphatase: 63 IU/L (ref 39–117)
BILIRUBIN TOTAL: 1.7 mg/dL — AB (ref 0.0–1.2)
BUN/Creatinine Ratio: 17 (ref 10–22)
BUN: 18 mg/dL (ref 8–27)
CHLORIDE: 99 mmol/L (ref 97–108)
CO2: 26 mmol/L (ref 18–29)
Calcium: 9.6 mg/dL (ref 8.6–10.2)
Creatinine, Ser: 1.06 mg/dL (ref 0.76–1.27)
GFR calc Af Amer: 81 mL/min/{1.73_m2} (ref >59)
GFR calc non Af Amer: 70 mL/min/{1.73_m2} (ref >59)
Globulin, Total: 2.3 g/dL (ref 1.5–4.5)
Glucose: 128 mg/dL — ABNORMAL HIGH (ref 65–99)
POTASSIUM: 5.2 mmol/L (ref 3.5–5.2)
SODIUM: 140 mmol/L (ref 134–144)
TOTAL PROTEIN: 6.8 g/dL (ref 6.0–8.5)

## 2014-10-31 LAB — NMR, LIPOPROFILE
Cholesterol: 146 mg/dL (ref 100–199)
HDL Cholesterol by NMR: 46 mg/dL (ref >39)
HDL Particle Number: 33.7 umol/L (ref >=30.5)
LDL PARTICLE NUMBER: 1037 nmol/L — AB (ref <1000)
LDL SIZE: 21 nm (ref >20.5)
LDL-C: 77 mg/dL (ref 0–99)
LP-IR SCORE: 62 — AB (ref <=45)
SMALL LDL PARTICLE NUMBER: 594 nmol/L — AB (ref <=527)
Triglycerides by NMR: 114 mg/dL (ref 0–149)

## 2014-11-22 ENCOUNTER — Ambulatory Visit (HOSPITAL_COMMUNITY): Payer: Medicare Other | Admitting: Hematology & Oncology

## 2014-11-22 ENCOUNTER — Other Ambulatory Visit (HOSPITAL_COMMUNITY): Payer: Medicare Other

## 2014-12-13 ENCOUNTER — Encounter (HOSPITAL_COMMUNITY): Payer: Medicare Other | Attending: Hematology & Oncology

## 2014-12-13 ENCOUNTER — Ambulatory Visit (HOSPITAL_COMMUNITY): Payer: Medicare (Managed Care) | Admitting: Hematology & Oncology

## 2014-12-13 ENCOUNTER — Encounter (HOSPITAL_BASED_OUTPATIENT_CLINIC_OR_DEPARTMENT_OTHER): Payer: Medicare Other | Admitting: Hematology & Oncology

## 2014-12-13 ENCOUNTER — Encounter (HOSPITAL_COMMUNITY): Payer: Self-pay | Admitting: Hematology & Oncology

## 2014-12-13 VITALS — BP 134/71 | HR 71 | Temp 97.6°F | Resp 18 | Wt 168.0 lb

## 2014-12-13 DIAGNOSIS — D696 Thrombocytopenia, unspecified: Secondary | ICD-10-CM | POA: Diagnosis present

## 2014-12-13 DIAGNOSIS — G629 Polyneuropathy, unspecified: Secondary | ICD-10-CM

## 2014-12-13 LAB — CBC WITH DIFFERENTIAL/PLATELET
BASOS ABS: 0.1 10*3/uL (ref 0.0–0.1)
Basophils Relative: 1 % (ref 0–1)
EOS ABS: 0.2 10*3/uL (ref 0.0–0.7)
EOS PCT: 3 % (ref 0–5)
HCT: 39.2 % (ref 39.0–52.0)
Hemoglobin: 13.6 g/dL (ref 13.0–17.0)
LYMPHS ABS: 2.4 10*3/uL (ref 0.7–4.0)
Lymphocytes Relative: 39 % (ref 12–46)
MCH: 29.4 pg (ref 26.0–34.0)
MCHC: 34.7 g/dL (ref 30.0–36.0)
MCV: 84.7 fL (ref 78.0–100.0)
MONOS PCT: 7 % (ref 3–12)
Monocytes Absolute: 0.4 10*3/uL (ref 0.1–1.0)
Neutro Abs: 3.1 10*3/uL (ref 1.7–7.7)
Neutrophils Relative %: 50 % (ref 43–77)
Platelets: 137 10*3/uL — ABNORMAL LOW (ref 150–400)
RBC: 4.63 MIL/uL (ref 4.22–5.81)
RDW: 12.9 % (ref 11.5–15.5)
WBC: 6 10*3/uL (ref 4.0–10.5)

## 2014-12-13 NOTE — Patient Instructions (Signed)
[facility] Discharge Instructions  RECOMMENDATIONS MADE BY THE CONSULTANT AND ANY TEST RESULTS WILL BE SENT TO YOUR REFERRING PHYSICIAN.  Discussion by Dr. Galen ManilaPenland. Below are your lab results from today.  Ref. Range 12/13/2014 13:05  WBC Latest Range: 4.6-10.2 K/uL 6.0  RBC Latest Range: 4.69-6.13 M/uL 4.63  Hemoglobin Latest Range: 13.0-17.0 g/dL 91.413.6  HCT Latest Range: 39.0-52.0 % 39.2  MCV Latest Range: 80-97 fL 84.7  MCH Latest Range: 27-31.2 pg 29.4  MCHC Latest Range: 31.8-35.4 g/dL 78.234.7  RDW Latest Range: 11.5-15.5 % 12.9  Platelets Latest Range: 150-400 K/uL 137 (L)  Neutrophils Relative % Latest Range: 43-77 % 50  Lymphocytes Relative Latest Range: 12-46 % 39  Monocytes Relative Latest Range: 3-12 % 7  Eosinophils Relative Latest Range: 0-5 % 3  Basophils Relative Latest Range: 0-1 % 1  NEUT# Latest Range: 1.7-7.7 K/uL 3.1  Lymphocytes Absolute Latest Range: 0.7-4.0 K/uL 2.4  Monocytes Absolute Latest Range: 0.1-1.0 K/uL 0.4  Eosinophils Absolute Latest Range: 0.0-0.7 K/uL 0.2  Basophils Absolute Latest Range: 0.0-0.1 K/uL 0.1  We will refer you back to Paulene FloorMary Martin, NP to monitor your blood counts and we will see you back in 1 year with labs and office visit. Report any unusual bruising or bleeding.  Thank you for choosing [facility] to provide your oncology and hematology care.  To afford each patient quality time with our provider, please arrive at least 15 minutes before your scheduled appointment time.    You need to re-schedule your appointment should you arrive 10 or more minutes late.  We strive to give you quality time with our providers, and arriving late affects you and other patients whose appointments are after yours.  Also, if you no show three or more times for appointments you may be dismissed from the clinic at the providers discretion.     Again, thank you for choosing [facility].  Our hope is that these requests will decrease the amount of time that you wait before being seen by our physicians.       _____________________________________________________________  Should you have questions after your visit to [facility], please contact our office at [phone] between the hours of 8:30 a.m. and 4:30 p.m.  Voicemails left after 4:30 p.m. will not be returned until the following business day.  For prescription refill requests, have your pharmacy contact our office.

## 2014-12-13 NOTE — Progress Notes (Signed)
LABS DRAWN

## 2014-12-13 NOTE — Progress Notes (Deleted)
Bennie PieriniMARTIN,[name], FNP [address] [place] [id number]    DIAGNOSIS: Chronic thrombocytopenia   CURRENT THERAPY: Observation.  INTERVAL HISTORY: Lorin GlassDaniel C Grunder 74 y.o. male returns for   MEDICAL HISTORY: Past Medical History  Diagnosis Date  . Pneumonia     x2  . High cholesterol   . Diabetes mellitus   . CAD (coronary artery disease)   . Hypertension   . Vitamin D deficiency     has Sleep apnea; Gout; Diabetes; Hyperlipidemia; Rosacea; COPD (chronic obstructive pulmonary disease); ED (erectile dysfunction); Peripheral neuropathy; CAD (coronary artery disease); Unstable angina; Essential hypertension, benign; and Thrombocytopenia, unspecified on his problem list.     is allergic to dust mite extract; mold extract; pollen extract; and tetracyclines & related.  [name] does not currently have medications on file.  SURGICAL HISTORY: Past Surgical History  Procedure Laterality Date  . Carotid endarterectomy    . Left heart catheterization with coronary angiogram N/A 02/13/2012    Procedure: LEFT HEART CATHETERIZATION WITH CORONARY ANGIOGRAM;  Surgeon: Donato SchultzMark Skains, MD;  [facility];  Service: Cardiovascular;  Laterality: N/A;    SOCIAL HISTORY: History   Social History  . Marital Status: Married    Spouse Name: N/A  . Number of Children: N/A  . Years of Education: N/A   Occupational History  . Not on file.   Social History Main Topics  . Smoking status: Former Smoker    Types: Cigarettes    Quit date: 02/10/1988  . Smokeless tobacco: Not on file  . Alcohol Use: No  . Drug Use: No  . Sexual Activity: Not on file   Other Topics Concern  . Not on file   Social History Narrative    FAMILY HISTORY: Family History  Problem Relation Age of Onset  . Diabetes Mother   . Stroke Mother   . Hypertension Mother   . Hyperlipidemia Mother   . Cancer Father   . Hypertension Brother   . Cancer Other     ROS  PHYSICAL  EXAMINATION  ECOG PERFORMANCE STATUS: {CHL ONC ECOG UE:[id number]}[phone]}  Filed Vitals:   12/13/14 1236  BP: 134/71  Pulse: 71  Temp: 97.6 F (36.4 C)  Resp: 18    Physical Exam  LABORATORY DATA:  CBC    Component Value Date/Time   WBC 5.6 05/24/2014 1534   WBC 5.9 04/25/2014 1149   RBC 4.32 05/24/2014 1534   RBC 4.6* 04/25/2014 1149   HGB 12.8* 05/24/2014 1534   HGB 13.5* 04/25/2014 1149   HCT 36.2* 05/24/2014 1534   HCT 39.7* 04/25/2014 1149   PLT 148* 05/24/2014 1534   MCV 83.8 05/24/2014 1534   MCV 86.4 04/25/2014 1149   MCH 29.6 05/24/2014 1534   MCH 29.4 04/25/2014 1149   MCHC 35.4 05/24/2014 1534   MCHC 34.0 04/25/2014 1149   RDW 13.0 05/24/2014 1534   LYMPHSABS 2.1 05/24/2014 1534   MONOABS 0.4 05/24/2014 1534   EOSABS 0.2 05/24/2014 1534   BASOSABS 0.0 05/24/2014 1534   CMP     Component Value Date/Time   NA 140 10/28/2014 0918   NA 137 05/11/2014 1519   K 5.2 10/28/2014 0918   CL 99 10/28/2014 0918   CO2 26 10/28/2014 0918   GLUCOSE 128* 10/28/2014 0918   GLUCOSE 102* 05/11/2014 1519   BUN 18 10/28/2014 0918   BUN 17 05/11/2014 1519   CREATININE 1.06 10/28/2014 0918   CREATININE 1.14 02/09/2013 0954  CALCIUM 9.6 10/28/2014 0918   PROT 6.8 10/28/2014 0918   PROT 7.2 05/11/2014 1519   ALBUMIN 4.4 05/11/2014 1519   AST 22 10/28/2014 0918   ALT 21 10/28/2014 0918   ALKPHOS 63 10/28/2014 0918   BILITOT 1.7* 10/28/2014 0918   BILITOT 1.7* 07/25/2014 0913   GFRNONAA 70 10/28/2014 0918   GFRNONAA 65 02/09/2013 0954   GFRAA 81 10/28/2014 0918   GFRAA 75 02/09/2013 0954       ASSESSMENT and THERAPY PLAN:    No problem-specific assessment & plan notes found for this encounter.   All questions were answered. The patient knows to call the clinic with any problems, questions or concerns. We can certainly see the patient much sooner if necessary. This note was electronically signed. Arvil Chaco 12/13/2014

## 2015-01-11 NOTE — Progress Notes (Signed)
Bennie Pierini, FNP   DIAGNOSIS: Chronic Thrombocytopenia  CURRENT THERAPY: Observation  INTERVAL HISTORY:   Dean Harris 74 y.o. male returns for evaluation of mild chronic thrombocytopenia. Platelet count today is 137K.  He denies any major new complaints.  Neuropathy has been an ongoing issue for him.  He denies any new bruising or bleeding.    MEDICAL HISTORY: Past Medical History  Diagnosis Date  . Pneumonia     x2  . High cholesterol   . Diabetes mellitus   . CAD (coronary artery disease)   . Hypertension   . Vitamin D deficiency     has Sleep apnea; Gout; Diabetes; Hyperlipidemia; Rosacea; COPD (chronic obstructive pulmonary disease); ED (erectile dysfunction); Peripheral neuropathy; CAD (coronary artery disease); Unstable angina; Essential hypertension, benign; and Thrombocytopenia, unspecified on his problem list.     is allergic to dust mite extract; mold extract; pollen extract; and tetracyclines & related.  @  SURGICAL HISTORY: Past Surgical History  Procedure Laterality Date  . Carotid endarterectomy    . Left heart catheterization with coronary angiogram N/A 02/13/2012    Procedure: LEFT HEART CATHETERIZATION WITH CORONARY ANGIOGRAM;  Surgeon: Donato Schultz, MD;  Location: [facility] CATH LAB;  Service: Cardiovascular;  Laterality: N/A;    SOCIAL HISTORY: History   Social History  . Marital Status: Married    Spouse Name: N/A  . Number of Children: N/A  . Years of Education: N/A   Occupational History  . Not on file.   Social History Main Topics  . Smoking status: Former Smoker    Types: Cigarettes    Quit date: 02/10/1988  . Smokeless tobacco: Not on file  . Alcohol Use: No  . Drug Use: No  . Sexual Activity: Not on file   Other Topics Concern  . Not on file   Social History Narrative    FAMILY HISTORY: Family History  Problem Relation Age of Onset  . Diabetes Mother   . Stroke Mother   . Hypertension Mother   .  Hyperlipidemia Mother   . Cancer Father   . Hypertension Brother   . Cancer Other     Review of Systems  Constitutional: Negative.   HENT: Negative.   Eyes: Negative.   Respiratory: Negative.   Cardiovascular: Negative.   Gastrointestinal: Negative.   Genitourinary: Negative.   Musculoskeletal: Positive for joint pain.  Skin: Negative.   Neurological: Positive for sensory change.  Endo/Heme/Allergies: Negative.   Psychiatric/Behavioral: Negative.     PHYSICAL EXAMINATION  ECOG PERFORMANCE STATUS: 0 - Asymptomatic  Filed Vitals:   12/13/14 1236  BP: 134/71  Pulse: 71  Temp: 97.6 F (36.4 C)  Resp: 18    Physical Exam  Constitutional: He is oriented to person, place, and time and well-developed, well-nourished, and in no distress.  HENT:  Head: Normocephalic and atraumatic.  Nose: Nose normal.  Mouth/Throat: Oropharynx is clear and moist. No oropharyngeal exudate.  Eyes: Conjunctivae and EOM are normal. Pupils are equal, round, and reactive to light. Right eye exhibits no discharge. Left eye exhibits no discharge. No scleral icterus.  Neck: Normal range of motion. Neck supple. No tracheal deviation present. No thyromegaly present.  Cardiovascular: Normal rate, regular rhythm and normal heart sounds.  Exam reveals no gallop and no friction rub.   No murmur heard. Pulmonary/Chest: Effort normal and breath sounds normal. He has no wheezes. He has no rales.  Abdominal: Soft. Bowel sounds are normal. He exhibits no distension and no mass.  There is no tenderness. There is no rebound and no guarding.  Musculoskeletal: Normal range of motion. He exhibits no edema.  Lymphadenopathy:    He has no cervical adenopathy.  Neurological: He is alert and oriented to person, place, and time. He has normal reflexes. No cranial nerve deficit. Gait normal. Coordination normal.  Skin: Skin is warm and dry. No rash noted.  Psychiatric: Mood, memory, affect and judgment normal.  Nursing  note and vitals reviewed.   LABORATORY DATA:  CBC    Component Value Date/Time   WBC 6.0 12/13/2014 1305   WBC 5.9 04/25/2014 1149   RBC 4.63 12/13/2014 1305   RBC 4.6* 04/25/2014 1149   HGB 13.6 12/13/2014 1305   HGB 13.5* 04/25/2014 1149   HCT 39.2 12/13/2014 1305   HCT 39.7* 04/25/2014 1149   PLT 137* 12/13/2014 1305   MCV 84.7 12/13/2014 1305   MCV 86.4 04/25/2014 1149   MCH 29.4 12/13/2014 1305   MCH 29.4 04/25/2014 1149   MCHC 34.7 12/13/2014 1305   MCHC 34.0 04/25/2014 1149   RDW 12.9 12/13/2014 1305   LYMPHSABS 2.4 12/13/2014 1305   MONOABS 0.4 12/13/2014 1305   EOSABS 0.2 12/13/2014 1305   BASOSABS 0.1 12/13/2014 1305   CMP     Component Value Date/Time   NA 140 10/28/2014 0918   NA 137 05/11/2014 1519   K 5.2 10/28/2014 0918   CL 99 10/28/2014 0918   CO2 26 10/28/2014 0918   GLUCOSE 128* 10/28/2014 0918   GLUCOSE 102* 05/11/2014 1519   BUN 18 10/28/2014 0918   BUN 17 05/11/2014 1519   CREATININE 1.06 10/28/2014 0918   CREATININE 1.14 02/09/2013 0954   CALCIUM 9.6 10/28/2014 0918   PROT 6.8 10/28/2014 0918   PROT 7.2 05/11/2014 1519   ALBUMIN 4.4 05/11/2014 1519   AST 22 10/28/2014 0918   ALT 21 10/28/2014 0918   ALKPHOS 63 10/28/2014 0918   BILITOT 1.7* 10/28/2014 0918   BILITOT 1.7* 07/25/2014 0913   GFRNONAA 70 10/28/2014 0918   GFRNONAA 65 02/09/2013 0954   GFRAA 81 10/28/2014 0918   GFRAA 75 02/09/2013 0954      ASSESSMENT and THERAPY PLAN:  Chronic mild thrombocytopenia Neuropathy  We spent time today discussing the possible causes of his mild thrombocytopenia.  His counts are certainly stable to improved.  He has no stigmata of bleeding.  He is followed regularly by his PCP and has routine labs on a fairly regular basis because of his other medical comorbidities.  Given his platelet count today of 137,000 we have opted for yearly follow-ups. I again reviewed symptoms of concern such as bruising that is unusual or new, specifically on  the trunk or back. We also discussedbleeding or gum bleeding. I advised him that at any time he develops any of these symptoms to notify us and we will bring him and promptly for a CBC and visit. I suspect his mild thrombocytopenia may be immune mediated or could also be medication related. We will see him back yearly. He agrees with this plan.   All questions were answered. The patient knows to call the clinic with any problems, questions or concerns. We can certainly see the patient much sooner if necessary. This note was electronically signed.  Chiquita LothS Donalyn Schneeberger MD  This document serves as a record of services personally performed by Loma MessingShannon Aristotle Lieb, MD. It was created on her behalf by Danelle BerryHarry Ramsamooj, a trained medical scribe. The creation of this record is based on  the scribe's personal observations and the provider's statements to them. This document has been checked and approved by the attending provider.

## 2015-02-01 ENCOUNTER — Encounter: Payer: Self-pay | Admitting: Nurse Practitioner

## 2015-02-01 ENCOUNTER — Ambulatory Visit (INDEPENDENT_AMBULATORY_CARE_PROVIDER_SITE_OTHER): Payer: Medicare Other | Admitting: Nurse Practitioner

## 2015-02-01 VITALS — BP 132/82 | HR 70 | Temp 96.8°F | Ht 63.0 in | Wt 169.0 lb

## 2015-02-01 DIAGNOSIS — I2510 Atherosclerotic heart disease of native coronary artery without angina pectoris: Secondary | ICD-10-CM | POA: Diagnosis not present

## 2015-02-01 DIAGNOSIS — J42 Unspecified chronic bronchitis: Secondary | ICD-10-CM

## 2015-02-01 DIAGNOSIS — E785 Hyperlipidemia, unspecified: Secondary | ICD-10-CM

## 2015-02-01 DIAGNOSIS — I10 Essential (primary) hypertension: Secondary | ICD-10-CM

## 2015-02-01 DIAGNOSIS — E1142 Type 2 diabetes mellitus with diabetic polyneuropathy: Secondary | ICD-10-CM | POA: Diagnosis not present

## 2015-02-01 DIAGNOSIS — D696 Thrombocytopenia, unspecified: Secondary | ICD-10-CM

## 2015-02-01 DIAGNOSIS — G629 Polyneuropathy, unspecified: Secondary | ICD-10-CM | POA: Diagnosis not present

## 2015-02-01 LAB — POCT GLYCOSYLATED HEMOGLOBIN (HGB A1C): HEMOGLOBIN A1C: 6.4

## 2015-02-01 MED ORDER — FLUTICASONE-SALMETEROL 100-50 MCG/DOSE IN AEPB: 1.0000 | INHALATION_SPRAY | Freq: Two times a day (BID) | RESPIRATORY_TRACT | Status: DC

## 2015-02-01 MED ORDER — LISINOPRIL 10 MG PO TABS: 10.0000 mg | ORAL_TABLET | Freq: Every day | ORAL | Status: DC

## 2015-02-01 MED ORDER — SIMVASTATIN 40 MG PO TABS: 40.0000 mg | ORAL_TABLET | Freq: Every evening | ORAL | Status: DC

## 2015-02-01 MED ORDER — IPRATROPIUM-ALBUTEROL 18-103 MCG/ACT IN AERO: 1.0000 | INHALATION_SPRAY | Freq: Four times a day (QID) | RESPIRATORY_TRACT | Status: DC | PRN

## 2015-02-01 MED ORDER — METFORMIN HCL 500 MG PO TABS: 250.0000 mg | ORAL_TABLET | Freq: Two times a day (BID) | ORAL | Status: DC

## 2015-02-01 NOTE — Progress Notes (Signed)
Subjective:    Patient ID: Dean Harris, male    DOB: 09/04/1941, 74 y.o.   MRN: [id number]  Patient is here today for his annual physical. No complaints.   Hyperlipidemia This is a chronic problem. The current episode started more than 1 year ago. The problem is controlled. Recent lipid tests were reviewed and are normal. Pertinent negatives include no chest pain or shortness of breath. Current antihyperlipidemic treatment includes statins. Risk factors for coronary artery disease include male sex, hypertension, dyslipidemia and diabetes mellitus.  Diabetes He presents for his follow-up diabetic visit. He has type 2 diabetes mellitus. No MedicAlert identification noted. His disease course has been stable. Pertinent negatives for hypoglycemia include no confusion, dizziness or headaches. Pertinent negatives for diabetes include no chest pain, no fatigue, no polydipsia, no polyphagia, no polyuria and no visual change. Symptoms are stable. Risk factors for coronary artery disease include male sex, post-menopausal, dyslipidemia and diabetes mellitus. Current diabetic treatment includes oral agent (monotherapy). He is compliant with treatment all of the time. He does not see a podiatrist.Eye exam is current.  Hypertension This is a chronic problem. The current episode started more than 1 year ago. The problem is unchanged. The problem is controlled. Pertinent negatives include no chest pain, headaches, palpitations or shortness of breath. Risk factors for coronary artery disease include dyslipidemia, male gender and diabetes mellitus. Past treatments include diuretics. There are no compliance problems.   Asthma Combivent PRN, . Angina/CAD Imdur- patient is not seeing a cardiologist currently. No recent chest pain COPD Uses inhalers daily- denies SOB or cough. Thrombocytopenia Platelets low in past- need to recheck today    Review of Systems  Constitutional: Negative for fatigue.  Respiratory:  Negative for shortness of breath.   Cardiovascular: Negative for chest pain and palpitations.  Endocrine: Negative for polydipsia, polyphagia and polyuria.  Neurological: Negative for dizziness and headaches.  Psychiatric/Behavioral: Negative for confusion.  All other systems reviewed and are negative.      Objective:   Physical Exam  Constitutional: He is oriented to person, place, and time. He appears well-developed and well-nourished.  HENT:  Head: Normocephalic.  Eyes: Conjunctivae are normal. Pupils are equal, round, and reactive to light.  Cardiovascular: Normal rate and regular rhythm.   Pulmonary/Chest: Effort normal and breath sounds normal. No respiratory distress. He has no wheezes. He has no rales. He exhibits no tenderness.  Musculoskeletal: Normal range of motion.  Neurological: He is alert and oriented to person, place, and time. He has normal reflexes.  Skin: Skin is warm.  Psychiatric: He has a normal mood and affect. His behavior is normal. Judgment and thought content normal.    BP 132/82 mmHg  Pulse 70  Temp(Src) 96.8 F (36 C) (Oral)  Ht $R'5\' 3"'rD$  (1.6 m)  Wt 169 lb (76.658 kg)  BMI 29.94 kg/m2  Results for orders placed or performed in visit on 02/01/15  POCT glycosylated hemoglobin (Hb A1C)  Result Value Ref Range   Hemoglobin A1C 6.4         Assessment & Plan:  1. Essential hypertension, benign Do not add salt to diet - CMP14+EGFR - lisinopril (PRINIVIL,ZESTRIL) 10 MG tablet; Take 1 tablet (10 mg total) by mouth daily.  Dispense: 30 tablet; Refill: 5   2. Type 2 diabetes mellitus with diabetic polyneuropathy Continue to watch carbs in diet - POCT glycosylated hemoglobin (Hb A1C) - metFORMIN (GLUCOPHAGE) 500 MG tablet; Take 0.5 tablets (250 mg total) by mouth 2 (two) times daily  with a meal.  Dispense: 60 tablet; Refill: 5   3. Hyperlipidemia Low fat diet - NMR, lipoprofile - simvastatin (ZOCOR) 40 MG tablet; Take 1 tablet (40 mg total) by  mouth every evening.  Dispense: 30 tablet; Refill: 5  4. Coronary artery disease involving native coronary artery of native heart without angina pectoris Keep follow up appointmnet with cardiologist  5. Peripheral neuropathy  6. Thrombocytopenia recheck platelets today  7. 0. Chronic bronchitis, unspecified chronic bronchitis type - Fluticasone-Salmeterol (ADVAIR DISKUS) 100-50 MCG/DOSE AEPB; Inhale 1 puff into the lungs every 12 (twelve) hours.  Dispense: 60 each; Refill: 5 - albuterol-ipratropium (COMBIVENT) 18-103 MCG/ACT inhaler; Inhale 1 puff into the lungs every 6 (six) hours as needed.  Dispense: 1 Inhaler; Refill: 5    Labs pending Health maintenance reviewed Diet and exercise encouraged Continue all meds Follow up  In 3 months   [place], FNP

## 2015-02-01 NOTE — Patient Instructions (Signed)
Fat and Cholesterol Control Diet Fat and cholesterol levels in your blood and organs are influenced by your diet. High levels of fat and cholesterol may lead to diseases of the heart, small and large blood vessels, gallbladder, liver, and pancreas. CONTROLLING FAT AND CHOLESTEROL WITH DIET Although exercise and lifestyle factors are important, your diet is key. That is because certain foods are known to raise cholesterol and others to lower it. The goal is to balance foods for their effect on cholesterol and more importantly, to replace saturated and trans fat with other types of fat, such as monounsaturated fat, polyunsaturated fat, and omega-3 fatty acids. On average, a person should consume no more than 15 to 17 g of saturated fat daily. Saturated and trans fats are considered "bad" fats, and they will raise LDL cholesterol. Saturated fats are primarily found in animal products such as meats, butter, and cream. However, that does not mean you need to give up all your favorite foods. Today, there are good tasting, low-fat, low-cholesterol substitutes for most of the things you like to eat. Choose low-fat or nonfat alternatives. Choose round or loin cuts of red meat. These types of cuts are lowest in fat and cholesterol. Chicken (without the skin), fish, veal, and ground turkey breast are great choices. Eliminate fatty meats, such as hot dogs and salami. Even shellfish have little or no saturated fat. Have a 3 oz (85 g) portion when you eat lean meat, poultry, or fish. Trans fats are also called "partially hydrogenated oils." They are oils that have been scientifically manipulated so that they are solid at room temperature resulting in a longer shelf life and improved taste and texture of foods in which they are added. Trans fats are found in stick margarine, some tub margarines, cookies, crackers, and baked goods.  When baking and cooking, oils are a great substitute for butter. The monounsaturated oils are  especially beneficial since it is believed they lower LDL and raise HDL. The oils you should avoid entirely are saturated tropical oils, such as coconut and palm.  Remember to eat a lot from food groups that are naturally free of saturated and trans fat, including fish, fruit, vegetables, beans, grains (barley, rice, couscous, bulgur wheat), and pasta (without cream sauces).  IDENTIFYING FOODS THAT LOWER FAT AND CHOLESTEROL  Soluble fiber may lower your cholesterol. This type of fiber is found in fruits such as apples, vegetables such as broccoli, potatoes, and carrots, legumes such as beans, peas, and lentils, and grains such as barley. Foods fortified with plant sterols (phytosterol) may also lower cholesterol. You should eat at least 2 g per day of these foods for a cholesterol lowering effect.  Read package labels to identify low-saturated fats, trans fat free, and low-fat foods at the supermarket. Select cheeses that have only 2 to 3 g saturated fat per ounce. Use a heart-healthy tub margarine that is free of trans fats or partially hydrogenated oil. When buying baked goods (cookies, crackers), avoid partially hydrogenated oils. Breads and muffins should be made from whole grains (whole-wheat or whole oat flour, instead of "flour" or "enriched flour"). Buy non-creamy canned soups with reduced salt and no added fats.  FOOD PREPARATION TECHNIQUES  Never deep-fry. If you must fry, either stir-fry, which uses very little fat, or use non-stick cooking sprays. When possible, broil, bake, or roast meats, and steam vegetables. Instead of putting butter or margarine on vegetables, use lemon and herbs, applesauce, and cinnamon (for squash and sweet potatoes). Use nonfat   yogurt, salsa, and low-fat dressings for salads.  LOW-SATURATED FAT / LOW-FAT FOOD SUBSTITUTES Meats / Saturated Fat (g)  Avoid: Steak, marbled (3 oz/85 g) / 11 g  Choose: Steak, lean (3 oz/85 g) / 4 g  Avoid: Hamburger (3 oz/85 g) / 7  g  Choose: Hamburger, lean (3 oz/85 g) / 5 g  Avoid: Ham (3 oz/85 g) / 6 g  Choose: Ham, lean cut (3 oz/85 g) / 2.4 g  Avoid: Chicken, with skin, dark meat (3 oz/85 g) / 4 g  Choose: Chicken, skin removed, dark meat (3 oz/85 g) / 2 g  Avoid: Chicken, with skin, light meat (3 oz/85 g) / 2.5 g  Choose: Chicken, skin removed, light meat (3 oz/85 g) / 1 g Dairy / Saturated Fat (g)  Avoid: Whole milk (1 cup) / 5 g  Choose: Low-fat milk, 2% (1 cup) / 3 g  Choose: Low-fat milk, 1% (1 cup) / 1.5 g  Choose: Skim milk (1 cup) / 0.3 g  Avoid: Hard cheese (1 oz/28 g) / 6 g  Choose: Skim milk cheese (1 oz/28 g) / 2 to 3 g  Avoid: Cottage cheese, 4% fat (1 cup) / 6.5 g  Choose: Low-fat cottage cheese, 1% fat (1 cup) / 1.5 g  Avoid: Ice cream (1 cup) / 9 g  Choose: Sherbet (1 cup) / 2.5 g  Choose: Nonfat frozen yogurt (1 cup) / 0.3 g  Choose: Frozen fruit bar / trace  Avoid: Whipped cream (1 tbs) / 3.5 g  Choose: Nondairy whipped topping (1 tbs) / 1 g Condiments / Saturated Fat (g)  Avoid: Mayonnaise (1 tbs) / 2 g  Choose: Low-fat mayonnaise (1 tbs) / 1 g  Avoid: Butter (1 tbs) / 7 g  Choose: Extra light margarine (1 tbs) / 1 g  Avoid: Coconut oil (1 tbs) / 11.8 g  Choose: Olive oil (1 tbs) / 1.8 g  Choose: Corn oil (1 tbs) / 1.7 g  Choose: Safflower oil (1 tbs) / 1.2 g  Choose: Sunflower oil (1 tbs) / 1.4 g  Choose: Soybean oil (1 tbs) / 2.4 g  Choose: Canola oil (1 tbs) / 1 g Document Released: 09/01/2005 Document Revised: 12/27/2012 Document Reviewed: 11/30/2013 [employer] Patient Information 2015 [employer]. This information is not intended to replace advice given to you by your health care provider. Make sure you discuss any questions you have with your health care provider.  

## 2015-02-02 LAB — CMP14+EGFR
A/G RATIO: 2.1 (ref 1.1–2.5)
ALBUMIN: 4.7 g/dL (ref 3.5–4.8)
ALK PHOS: 59 IU/L (ref 39–117)
ALT: 26 IU/L (ref 0–44)
AST: 18 IU/L (ref 0–40)
BILIRUBIN TOTAL: 1.7 mg/dL — AB (ref 0.0–1.2)
BUN / CREAT RATIO: 14 (ref 10–22)
BUN: 16 mg/dL (ref 8–27)
CO2: 25 mmol/L (ref 18–29)
CREATININE: 1.13 mg/dL (ref 0.76–1.27)
Calcium: 10 mg/dL (ref 8.6–10.2)
Chloride: 100 mmol/L (ref 97–108)
GFR calc non Af Amer: 65 mL/min/{1.73_m2} (ref >59)
GFR, EST AFRICAN AMERICAN: 75 mL/min/{1.73_m2} (ref >59)
GLOBULIN, TOTAL: 2.2 g/dL (ref 1.5–4.5)
Glucose: 148 mg/dL — ABNORMAL HIGH (ref 65–99)
Potassium: 5.1 mmol/L (ref 3.5–5.2)
Sodium: 140 mmol/L (ref 134–144)
Total Protein: 6.9 g/dL (ref 6.0–8.5)

## 2015-02-02 LAB — ANEMIA PROFILE B
BASOS ABS: 0 10*3/uL (ref 0.0–0.2)
Basos: 1 %
EOS (ABSOLUTE): 0.2 10*3/uL (ref 0.0–0.4)
Eos: 3 %
Ferritin: 76 ng/mL (ref 30–400)
Hematocrit: 42.3 % (ref 37.5–51.0)
Hemoglobin: 14.1 g/dL (ref 12.6–17.7)
IMMATURE GRANULOCYTES: 0 %
Immature Grans (Abs): 0 10*3/uL (ref 0.0–0.1)
Iron Saturation: 31 % (ref 15–55)
Iron: 100 ug/dL (ref 38–169)
LYMPHS: 37 %
Lymphocytes Absolute: 2.2 10*3/uL (ref 0.7–3.1)
MCH: 28.8 pg (ref 26.6–33.0)
MCHC: 33.3 g/dL (ref 31.5–35.7)
MCV: 86 fL (ref 79–97)
MONOCYTES: 6 %
Monocytes Absolute: 0.4 10*3/uL (ref 0.1–0.9)
NEUTROS PCT: 53 %
Neutrophils Absolute: 3.2 10*3/uL (ref 1.4–7.0)
PLATELETS: 142 10*3/uL — AB (ref 150–379)
RBC: 4.9 x10E6/uL (ref 4.14–5.80)
RDW: 13.8 % (ref 12.3–15.4)
RETIC CT PCT: 1.5 % (ref 0.6–2.6)
Total Iron Binding Capacity: 327 ug/dL (ref 250–450)
UIBC: 227 ug/dL (ref 111–343)
Vitamin B-12: 587 pg/mL (ref 211–946)
WBC: 6 10*3/uL (ref 3.4–10.8)

## 2015-02-02 LAB — NMR, LIPOPROFILE
CHOLESTEROL: 155 mg/dL (ref 100–199)
HDL Cholesterol by NMR: 43 mg/dL (ref >39)
HDL Particle Number: 33.2 umol/L (ref >=30.5)
LDL PARTICLE NUMBER: 954 nmol/L (ref <1000)
LDL SIZE: 20.9 nm (ref >20.5)
LDL-C: 78 mg/dL (ref 0–99)
LP-IR Score: 73 — ABNORMAL HIGH (ref <=45)
Small LDL Particle Number: 326 nmol/L (ref <=527)
Triglycerides by NMR: 168 mg/dL — ABNORMAL HIGH (ref 0–149)

## 2015-05-10 ENCOUNTER — Ambulatory Visit (INDEPENDENT_AMBULATORY_CARE_PROVIDER_SITE_OTHER): Payer: Medicare Other | Admitting: Nurse Practitioner

## 2015-05-10 ENCOUNTER — Encounter: Payer: Self-pay | Admitting: Nurse Practitioner

## 2015-05-10 VITALS — BP 110/69 | HR 73 | Temp 96.9°F | Ht 63.0 in | Wt 172.0 lb

## 2015-05-10 DIAGNOSIS — J42 Unspecified chronic bronchitis: Secondary | ICD-10-CM | POA: Diagnosis not present

## 2015-05-10 DIAGNOSIS — M1009 Idiopathic gout, multiple sites: Secondary | ICD-10-CM | POA: Diagnosis not present

## 2015-05-10 DIAGNOSIS — G629 Polyneuropathy, unspecified: Secondary | ICD-10-CM

## 2015-05-10 DIAGNOSIS — D696 Thrombocytopenia, unspecified: Secondary | ICD-10-CM | POA: Diagnosis not present

## 2015-05-10 DIAGNOSIS — I10 Essential (primary) hypertension: Secondary | ICD-10-CM | POA: Diagnosis not present

## 2015-05-10 DIAGNOSIS — E1142 Type 2 diabetes mellitus with diabetic polyneuropathy: Secondary | ICD-10-CM | POA: Diagnosis not present

## 2015-05-10 DIAGNOSIS — Z23 Encounter for immunization: Secondary | ICD-10-CM

## 2015-05-10 DIAGNOSIS — L719 Rosacea, unspecified: Secondary | ICD-10-CM | POA: Diagnosis not present

## 2015-05-10 DIAGNOSIS — I2510 Atherosclerotic heart disease of native coronary artery without angina pectoris: Secondary | ICD-10-CM

## 2015-05-10 DIAGNOSIS — E785 Hyperlipidemia, unspecified: Secondary | ICD-10-CM

## 2015-05-10 DIAGNOSIS — N5201 Erectile dysfunction due to arterial insufficiency: Secondary | ICD-10-CM | POA: Diagnosis not present

## 2015-05-10 MED ORDER — SIMVASTATIN 40 MG PO TABS: 40.0000 mg | ORAL_TABLET | Freq: Every evening | ORAL | Status: DC

## 2015-05-10 MED ORDER — LISINOPRIL 10 MG PO TABS: 10.0000 mg | ORAL_TABLET | Freq: Every day | ORAL | Status: DC

## 2015-05-10 MED ORDER — ISOSORBIDE MONONITRATE ER 30 MG PO TB24: 30.0000 mg | ORAL_TABLET | Freq: Every day | ORAL | Status: DC

## 2015-05-10 MED ORDER — METFORMIN HCL 500 MG PO TABS: 250.0000 mg | ORAL_TABLET | Freq: Two times a day (BID) | ORAL | Status: DC

## 2015-05-10 MED ORDER — IPRATROPIUM-ALBUTEROL 18-103 MCG/ACT IN AERO: 1.0000 | INHALATION_SPRAY | Freq: Four times a day (QID) | RESPIRATORY_TRACT | Status: DC | PRN

## 2015-05-10 MED ORDER — FLUTICASONE-SALMETEROL 100-50 MCG/DOSE IN AEPB
1.0000 | INHALATION_SPRAY | Freq: Two times a day (BID) | RESPIRATORY_TRACT | Status: DC
Start: 2015-05-10 — End: 2015-10-11

## 2015-05-10 NOTE — Patient Instructions (Signed)

## 2015-05-10 NOTE — Progress Notes (Signed)
Subjective:    Patient ID: Dean Harris, male    DOB: 06/10/1941, 74 y.o.   MRN: [id number]   Patient here today for follow up of chronic medical problems. Recently went to [place] and had blood work done- results brought with him   Hyperlipidemia This is a chronic problem. The current episode started more than 1 year ago. The problem is controlled. Recent lipid tests were reviewed and are normal. Pertinent negatives include no chest pain or shortness of breath. Current antihyperlipidemic treatment includes statins. Risk factors for coronary artery disease include male sex, hypertension, dyslipidemia and diabetes mellitus.  Diabetes He presents for his follow-up diabetic visit. He has type 2 diabetes mellitus. No MedicAlert identification noted. His disease course has been stable. Pertinent negatives for hypoglycemia include no confusion, dizziness or headaches. Pertinent negatives for diabetes include no chest pain, no fatigue, no polydipsia, no polyphagia, no polyuria and no visual change. Symptoms are stable. Risk factors for coronary artery disease include male sex, post-menopausal, dyslipidemia and diabetes mellitus. Current diabetic treatment includes oral agent (monotherapy). He is compliant with treatment all of the time. He does not see a podiatrist.Eye exam is current.  Hypertension This is a chronic problem. The current episode started more than 1 year ago. The problem is unchanged. The problem is controlled. Pertinent negatives include no chest pain, headaches, palpitations or shortness of breath. Risk factors for coronary artery disease include dyslipidemia, male gender and diabetes mellitus. Past treatments include diuretics. There are no compliance problems.       Review of Systems  Constitutional: Negative for fatigue.  Respiratory: Negative for shortness of breath.   Cardiovascular: Negative for chest pain and palpitations.  Endocrine: Negative for polydipsia, polyphagia and  polyuria.  Neurological: Negative for dizziness and headaches.  Psychiatric/Behavioral: Negative for confusion.       Objective:   Physical Exam  Constitutional: He is oriented to person, place, and time. He appears well-developed and well-nourished.  HENT:  Head: Normocephalic.  Eyes: Conjunctivae are normal. Pupils are equal, round, and reactive to light.  Cardiovascular: Normal rate and regular rhythm.   Pulmonary/Chest: Effort normal and breath sounds normal. No respiratory distress. He has no wheezes. He has no rales. He exhibits no tenderness.  Musculoskeletal: Normal range of motion.  Neurological: He is alert and oriented to person, place, and time. He has normal reflexes.  Skin: Skin is warm.  Psychiatric: He has a normal mood and affect. His behavior is normal. Judgment and thought content normal.    BP 110/69 mmHg  Pulse 73  Temp(Src) 96.9 F (36.1 C) (Oral)  Ht $R'5\' 3"'Fs$  (1.6 m)  Wt 172 lb (78.019 kg)  BMI 30.48 kg/m2  HGBA1C 6.6 July 29,2016     Assessment & Plan:  1. Essential hypertension, benign Do not add salt to diet - CMP14+EGFR - lisinopril (PRINIVIL,ZESTRIL) 10 MG tablet; Take 1 tablet (10 mg total) by mouth daily.  Dispense: 30 tablet; Refill: 5  2. Type 2 diabetes mellitus with diabetic polyneuropathy Continue to count carbs in diet - POCT glycosylated hemoglobin (Hb A1C) - metFORMIN (GLUCOPHAGE) 500 MG tablet; Take 0.5 tablets (250 mg total) by mouth 2 (two) times daily with a meal.  Dispense: 60 tablet; Refill: 5  3. Hyperlipidemia Low fat diet - Lipid panel - simvastatin (ZOCOR) 40 MG tablet; Take 1 tablet (40 mg total) by mouth every evening.  Dispense: 30 tablet; Refill: 5  4. Coronary artery disease involving native coronary artery of native heart  without angina pectoris  5. Peripheral neuropathy  6. Rosacea  7. Erectile dysfunction due to arterial insufficiency  8. Idiopathic gout of multiple sites, unspecified chronicity  9.  Thrombocytopenia Labs t obe faxed to hematologist  10. Essential hypertension - isosorbide mononitrate (IMDUR) 30 MG 24 hr tablet; Take 1 tablet (30 mg total) by mouth daily.  Dispense: 30 tablet; Refill: 5  11. Chronic bronchitis, unspecified chronic bronchitis type - albuterol-ipratropium (COMBIVENT) 18-103 MCG/ACT inhaler; Inhale 1 puff into the lungs every 6 (six) hours as needed.  Dispense: 1 Inhaler; Refill: 5 - Fluticasone-Salmeterol (ADVAIR DISKUS) 100-50 MCG/DOSE AEPB; Inhale 1 puff into the lungs every 12 (twelve) hours.  Dispense: 60 each; Refill: 5    Labs pending Health maintenance reviewed Diet and exercise encouraged Continue all meds Follow up  In 3 month   [place], FNP

## 2015-05-10 NOTE — Addendum Note (Signed)
Addended by: Cleda Daub on: 05/10/2015 02:20 PM   Modules accepted: Orders

## 2015-07-09 ENCOUNTER — Ambulatory Visit (INDEPENDENT_AMBULATORY_CARE_PROVIDER_SITE_OTHER): Payer: Medicare Other | Admitting: Nurse Practitioner

## 2015-07-09 ENCOUNTER — Encounter: Payer: Self-pay | Admitting: Nurse Practitioner

## 2015-07-09 VITALS — BP 124/67 | HR 79 | Temp 97.0°F | Ht 63.0 in | Wt 173.0 lb

## 2015-07-09 DIAGNOSIS — Z1212 Encounter for screening for malignant neoplasm of rectum: Secondary | ICD-10-CM

## 2015-07-09 DIAGNOSIS — J42 Unspecified chronic bronchitis: Secondary | ICD-10-CM | POA: Diagnosis not present

## 2015-07-09 DIAGNOSIS — Z6830 Body mass index (BMI) 30.0-30.9, adult: Secondary | ICD-10-CM | POA: Insufficient documentation

## 2015-07-09 DIAGNOSIS — E1142 Type 2 diabetes mellitus with diabetic polyneuropathy: Secondary | ICD-10-CM | POA: Diagnosis not present

## 2015-07-09 DIAGNOSIS — I2510 Atherosclerotic heart disease of native coronary artery without angina pectoris: Secondary | ICD-10-CM

## 2015-07-09 DIAGNOSIS — E785 Hyperlipidemia, unspecified: Secondary | ICD-10-CM | POA: Diagnosis not present

## 2015-07-09 DIAGNOSIS — I10 Essential (primary) hypertension: Secondary | ICD-10-CM

## 2015-07-09 LAB — POCT GLYCOSYLATED HEMOGLOBIN (HGB A1C): HEMOGLOBIN A1C: 6.7

## 2015-07-09 MED ORDER — MELOXICAM 15 MG PO TABS: 15.0000 mg | ORAL_TABLET | Freq: Every day | ORAL | Status: DC

## 2015-07-09 NOTE — Patient Instructions (Signed)

## 2015-07-09 NOTE — Progress Notes (Signed)
Subjective:    Patient ID: Dean Harris, male    DOB: 24-Dec-1940, 74 y.o.   MRN: [id number]   Patient here today for follow up of chronic medical problems.   * C/o left lower back pain- rates pain5/10- worse with standing or rolling over in bed. Started 1 week ago- has tried ibuprofen which helps some.  Hyperlipidemia This is a chronic problem. The current episode started more than 1 year ago. The problem is controlled. Recent lipid tests were reviewed and are normal. Pertinent negatives include no chest pain or shortness of breath. Current antihyperlipidemic treatment includes statins. Risk factors for coronary artery disease include male sex, hypertension, dyslipidemia and diabetes mellitus.  Diabetes He presents for his follow-up diabetic visit. He has type 2 diabetes mellitus. No MedicAlert identification noted. His disease course has been stable. Pertinent negatives for hypoglycemia include no confusion, dizziness or headaches. Pertinent negatives for diabetes include no chest pain, no fatigue, no polydipsia, no polyphagia, no polyuria and no visual change. Symptoms are stable. Risk factors for coronary artery disease include male sex, post-menopausal, dyslipidemia and diabetes mellitus. Current diabetic treatment includes oral agent (monotherapy). He is compliant with treatment all of the time. He does not see a podiatrist.Eye exam is current.  Hypertension This is a chronic problem. The current episode started more than 1 year ago. The problem is unchanged. The problem is controlled. Pertinent negatives include no chest pain, headaches, palpitations or shortness of breath. Risk factors for coronary artery disease include dyslipidemia, male gender and diabetes mellitus. Past treatments include diuretics. There are no compliance problems.   CAD Sees cardiologist yearly. Currently on imdur. COPD combivent daily and advair- no recent flare ups- denies SOB    Review of Systems    Constitutional: Negative.  Negative for fatigue.  HENT: Negative.   Respiratory: Negative.  Negative for shortness of breath.   Cardiovascular: Negative.  Negative for chest pain and palpitations.  Endocrine: Negative for polydipsia, polyphagia and polyuria.  Genitourinary: Negative.   Neurological: Negative for dizziness and headaches.  Psychiatric/Behavioral: Negative for confusion.       Objective:   Physical Exam  Constitutional: He is oriented to person, place, and time. He appears well-developed and well-nourished.  HENT:  Head: Normocephalic.  Eyes: Conjunctivae are normal. Pupils are equal, round, and reactive to light.  Cardiovascular: Normal rate and regular rhythm.   Pulmonary/Chest: Effort normal and breath sounds normal. No respiratory distress. He has no wheezes. He has no rales. He exhibits no tenderness.  Genitourinary:  No CVAtenderness  Musculoskeletal: Normal range of motion.  Left lower back pain- FROM with pain on flexion and rotation to right (-) SLR Motor strength and sensation distally intact  Neurological: He is alert and oriented to person, place, and time. He has normal reflexes.  Skin: Skin is warm.  Psychiatric: He has a normal mood and affect. His behavior is normal. Judgment and thought content normal.    BP 124/67 mmHg  Pulse 79  Temp(Src) 97 F (36.1 C) (Oral)  Ht _0  (1.6 m)  Wt 173 lb (78.472 kg)  BMI 30.65 kg/m2        Assessment & Plan:  1. Essential hypertension, benign Do not add salt to diet - CMP14+EGFR  2. Coronary artery disease involving native coronary artery of native heart without angina pectoris Needs to follow up with cardiologist  3. Chronic bronchitis, unspecified chronic bronchitis type ([place]) Avoid cigarette smoke  4. Type 2 diabetes mellitus with diabetic  polyneuropathy, without long-term current use of insulin (HCC) Continue to watch carbs in diet  5. Hyperlipidemia Low fat diet - Lipid panel  6. BMI  30.0-30.9,adult Discussed diet and exercise for person with BMI >25 Will recheck weight in 3-6 months  7. Low back pain - back strengthening exercises RTO if does not improve. Meds ordered this encounter  Medications  . meloxicam (MOBIC) 15 MG tablet    Sig: Take 1 tablet (15 mg total) by mouth daily.    Dispense:  30 tablet    Refill:  3    Order Specific Question:  Supervising Provider    Answer:  Chipper Herb [[id number]]    Hemoccult cards given- Labs pending Health maintenance reviewed Diet and exercise encouraged Continue all meds Follow up  In 3 month   [place], FNP

## 2015-07-10 LAB — CMP14+EGFR
A/G RATIO: 2.1 (ref 1.1–2.5)
ALK PHOS: 53 IU/L (ref 39–117)
ALT: 22 IU/L (ref 0–44)
AST: 17 IU/L (ref 0–40)
Albumin: 4.4 g/dL (ref 3.5–4.8)
BILIRUBIN TOTAL: 1.6 mg/dL — AB (ref 0.0–1.2)
BUN/Creatinine Ratio: 18 (ref 10–22)
BUN: 19 mg/dL (ref 8–27)
CALCIUM: 9.2 mg/dL (ref 8.6–10.2)
CHLORIDE: 97 mmol/L (ref 97–106)
CO2: 25 mmol/L (ref 18–29)
Creatinine, Ser: 1.04 mg/dL (ref 0.76–1.27)
GFR calc Af Amer: 83 mL/min/{1.73_m2} (ref >59)
GFR calc non Af Amer: 72 mL/min/{1.73_m2} (ref >59)
Globulin, Total: 2.1 g/dL (ref 1.5–4.5)
Glucose: 162 mg/dL — ABNORMAL HIGH (ref 65–99)
POTASSIUM: 4.5 mmol/L (ref 3.5–5.2)
Sodium: 140 mmol/L (ref 136–144)
Total Protein: 6.5 g/dL (ref 6.0–8.5)

## 2015-07-10 LAB — LIPID PANEL
CHOLESTEROL TOTAL: 125 mg/dL (ref 100–199)
Chol/HDL Ratio: 3.2 ratio units (ref 0.0–5.0)
HDL: 39 mg/dL — AB (ref >39)
LDL Calculated: 42 mg/dL (ref 0–99)
TRIGLYCERIDES: 220 mg/dL — AB (ref 0–149)
VLDL Cholesterol Cal: 44 mg/dL — ABNORMAL HIGH (ref 5–40)

## 2015-07-11 ENCOUNTER — Other Ambulatory Visit: Payer: Medicare Other

## 2015-07-11 DIAGNOSIS — Z1212 Encounter for screening for malignant neoplasm of rectum: Secondary | ICD-10-CM

## 2015-07-14 LAB — FECAL OCCULT BLOOD, IMMUNOCHEMICAL: FECAL OCCULT BLD: NEGATIVE

## 2015-08-14 ENCOUNTER — Ambulatory Visit: Payer: Medicare Other | Admitting: Nurse Practitioner

## 2015-10-11 ENCOUNTER — Ambulatory Visit (INDEPENDENT_AMBULATORY_CARE_PROVIDER_SITE_OTHER): Payer: 59 | Admitting: Nurse Practitioner

## 2015-10-11 ENCOUNTER — Encounter: Payer: Self-pay | Admitting: Nurse Practitioner

## 2015-10-11 VITALS — BP 134/82 | HR 67 | Temp 96.7°F | Ht 63.0 in | Wt 169.0 lb

## 2015-10-11 DIAGNOSIS — I2510 Atherosclerotic heart disease of native coronary artery without angina pectoris: Secondary | ICD-10-CM

## 2015-10-11 DIAGNOSIS — J42 Unspecified chronic bronchitis: Secondary | ICD-10-CM

## 2015-10-11 DIAGNOSIS — E1142 Type 2 diabetes mellitus with diabetic polyneuropathy: Secondary | ICD-10-CM

## 2015-10-11 DIAGNOSIS — I10 Essential (primary) hypertension: Secondary | ICD-10-CM | POA: Diagnosis not present

## 2015-10-11 DIAGNOSIS — D696 Thrombocytopenia, unspecified: Secondary | ICD-10-CM

## 2015-10-11 DIAGNOSIS — B354 Tinea corporis: Secondary | ICD-10-CM

## 2015-10-11 DIAGNOSIS — Z6830 Body mass index (BMI) 30.0-30.9, adult: Secondary | ICD-10-CM

## 2015-10-11 DIAGNOSIS — E785 Hyperlipidemia, unspecified: Secondary | ICD-10-CM

## 2015-10-11 LAB — POCT GLYCOSYLATED HEMOGLOBIN (HGB A1C): HEMOGLOBIN A1C: 6.5

## 2015-10-11 MED ORDER — SIMVASTATIN 40 MG PO TABS: 40.0000 mg | ORAL_TABLET | Freq: Every evening | ORAL | Status: DC

## 2015-10-11 MED ORDER — FLUTICASONE-SALMETEROL 100-50 MCG/DOSE IN AEPB: 1.0000 | INHALATION_SPRAY | Freq: Two times a day (BID) | RESPIRATORY_TRACT | Status: DC

## 2015-10-11 MED ORDER — ISOSORBIDE MONONITRATE ER 30 MG PO TB24: 30.0000 mg | ORAL_TABLET | Freq: Every day | ORAL | Status: DC

## 2015-10-11 MED ORDER — IPRATROPIUM-ALBUTEROL 18-103 MCG/ACT IN AERO: 1.0000 | INHALATION_SPRAY | Freq: Four times a day (QID) | RESPIRATORY_TRACT | Status: DC | PRN

## 2015-10-11 MED ORDER — METFORMIN HCL 500 MG PO TABS: 250.0000 mg | ORAL_TABLET | Freq: Two times a day (BID) | ORAL | Status: DC

## 2015-10-11 MED ORDER — LISINOPRIL 10 MG PO TABS: 10.0000 mg | ORAL_TABLET | Freq: Every day | ORAL | Status: DC

## 2015-10-11 NOTE — Progress Notes (Signed)
Subjective:    Patient ID: Dean Harris, male    DOB: 01-Jun-1941, 75 y.o.   MRN: [id number]   Patient here today for follow up of chronic medical problems.  Outpatient Encounter Prescriptions as of 10/11/2015  Medication Sig  . albuterol-ipratropium (COMBIVENT) 18-103 MCG/ACT inhaler Inhale 1 puff into the lungs every 6 (six) hours as needed.  Marland Kitchen aspirin 325 MG tablet Take 325 mg by mouth daily.  . Cholecalciferol (VITAMIN D) 2000 UNITS tablet Take 2,000 Units by mouth daily.   . Fluticasone-Salmeterol (ADVAIR DISKUS) 100-50 MCG/DOSE AEPB Inhale 1 puff into the lungs every 12 (twelve) hours.  Marland Kitchen glucose blood (FREESTYLE LITE) test strip Test 1X per day and prn   Dx. 250.02  . isosorbide mononitrate (IMDUR) 30 MG 24 hr tablet Take 1 tablet (30 mg total) by mouth daily.  Marland Kitchen lisinopril (PRINIVIL,ZESTRIL) 10 MG tablet Take 1 tablet (10 mg total) by mouth daily.  . meloxicam (MOBIC) 15 MG tablet Take 1 tablet (15 mg total) by mouth daily.  . metFORMIN (GLUCOPHAGE) 500 MG tablet Take 0.5 tablets (250 mg total) by mouth 2 (two) times daily with a meal.  . simvastatin (ZOCOR) 40 MG tablet Take 1 tablet (40 mg total) by mouth every evening.  . travoprost, benzalkonium, (TRAVATAN) 0.004 % ophthalmic solution 1 drop at bedtime.   No facility-administered encounter medications on file as of 10/11/2015.   * C/O rash on right upper thigh- has been there for several weeks- no worse but no better- itches if does not put hydrocortisone cream on it.  Hyperlipidemia This is a chronic problem. The current episode started more than 1 year ago. The problem is controlled. Recent lipid tests were reviewed and are normal. Pertinent negatives include no chest pain or shortness of breath. Current antihyperlipidemic treatment includes statins. Risk factors for coronary artery disease include male sex, hypertension, dyslipidemia and diabetes mellitus.  Diabetes He presents for his follow-up diabetic visit. He has type  2 diabetes mellitus. No MedicAlert identification noted. His disease course has been stable. Pertinent negatives for hypoglycemia include no confusion, dizziness or headaches. Pertinent negatives for diabetes include no chest pain, no fatigue, no polydipsia, no polyphagia, no polyuria and no visual change. Symptoms are stable. Risk factors for coronary artery disease include male sex, post-menopausal, dyslipidemia and diabetes mellitus. Current diabetic treatment includes oral agent (monotherapy). He is compliant with treatment all of the time. He does not see a podiatrist.Eye exam is current.  Hypertension This is a chronic problem. The current episode started more than 1 year ago. The problem is unchanged. The problem is controlled. Pertinent negatives include no chest pain, headaches, palpitations or shortness of breath. Risk factors for coronary artery disease include dyslipidemia, male gender and diabetes mellitus. Past treatments include diuretics. There are no compliance problems.   CAD Sees cardiologist yearly. Currently on imdur. COPD combivent daily and advair- no recent flare ups- denies SOB Hx thrombocytopenia Has not had labs checked in awhile- no bruising   Review of Systems  Constitutional: Negative.  Negative for fatigue.  HENT: Negative.   Respiratory: Negative.  Negative for shortness of breath.   Cardiovascular: Negative.  Negative for chest pain and palpitations.  Endocrine: Negative for polydipsia, polyphagia and polyuria.  Genitourinary: Negative.   Neurological: Negative for dizziness and headaches.  Psychiatric/Behavioral: Negative for confusion.       Objective:   Physical Exam  Constitutional: He is oriented to person, place, and time. He appears well-developed and well-nourished.  HENT:  Head: Normocephalic.  Eyes: Conjunctivae are normal. Pupils are equal, round, and reactive to light.  Cardiovascular: Normal rate and regular rhythm.   Pulmonary/Chest: Effort  normal and breath sounds normal. No respiratory distress. He has no wheezes. He has no rales. He exhibits no tenderness.  Genitourinary:  No CVAtenderness  Musculoskeletal: Normal range of motion.  Left lower back pain- FROM with pain on flexion and rotation to right (-) SLR Motor strength and sensation distally intact  Neurological: He is alert and oriented to person, place, and time. He has normal reflexes.  Skin: Skin is warm.  Erythematous annular lesion on right upper inner thigh  Psychiatric: He has a normal mood and affect. His behavior is normal. Judgment and thought content normal.    BP 134/82 mmHg  Pulse 67  Temp(Src) 96.7 F (35.9 C) (Oral)  Ht _0  (1.6 m)  Wt 169 lb (76.658 kg)  BMI 29.94 kg/m2  Results for orders placed or performed in visit on 10/11/15  POCT glycosylated hemoglobin (Hb A1C)  Result Value Ref Range   Hemoglobin A1C 6.5         Assessment & Plan:  1. Essential hypertension, benign Do not add salt o diet - CMP14+EGFR - lisinopril (PRINIVIL,ZESTRIL) 10 MG tablet; Take 1 tablet (10 mg total) by mouth daily.  Dispense: 30 tablet; Refill: 5  2. Type 2 diabetes mellitus with diabetic polyneuropathy, without long-term current use of insulin (HCC) Continue to count carbs in diet - POCT glycosylated hemoglobin (Hb A1C) - metFORMIN (GLUCOPHAGE) 500 MG tablet; Take 0.5 tablets (250 mg total) by mouth 2 (two) times daily with a meal.  Dispense: 60 tablet; Refill: 5  3. Hyperlipidemia Low fat diet - Lipid panel - simvastatin (ZOCOR) 40 MG tablet; Take 1 tablet (40 mg total) by mouth every evening.  Dispense: 30 tablet; Refill: 5  4. Thrombocytopenia ([place]) Labs pending - CBC with Differential/Platelet  5. Coronary artery disease involving native coronary artery of native heart without angina pectoris Continue follow up at cardiology - isosorbide mononitrate (IMDUR) 30 MG 24 hr tablet; Take 1 tablet (30 mg total) by mouth daily.  Dispense: 30  tablet; Refill: 5   6. Chronic bronchitis, unspecified chronic bronchitis type (HCC) - Fluticasone-Salmeterol (ADVAIR DISKUS) 100-50 MCG/DOSE AEPB; Inhale 1 puff into the lungs every 12 (twelve) hours.  Dispense: 60 each; Refill: 5 - albuterol-ipratropium (COMBIVENT) 18-103 MCG/ACT inhaler; Inhale 1 puff into the lungs every 6 (six) hours as needed.  Dispense: 1 Inhaler; Refill: 5  7. BMI 30.0-30.9,adult Discussed diet and exercise for person with BMI >25 Will recheck weight in 3-6 months'  8. Tinea corporis lamisil OTC Do not pick or scratch    Labs pending Health maintenance reviewed Diet and exercise encouraged Continue all meds Follow up  In 3 months   [place], FNP

## 2015-10-11 NOTE — Patient Instructions (Signed)

## 2015-10-12 LAB — CMP14+EGFR
ALBUMIN: 4.4 g/dL (ref 3.5–4.8)
ALK PHOS: 49 IU/L (ref 39–117)
ALT: 19 IU/L (ref 0–44)
AST: 18 IU/L (ref 0–40)
Albumin/Globulin Ratio: 2.1 (ref 1.1–2.5)
BUN/Creatinine Ratio: 13 (ref 10–22)
BUN: 15 mg/dL (ref 8–27)
Bilirubin Total: 1.8 mg/dL — ABNORMAL HIGH (ref 0.0–1.2)
CHLORIDE: 102 mmol/L (ref 96–106)
CO2: 25 mmol/L (ref 18–29)
CREATININE: 1.13 mg/dL (ref 0.76–1.27)
Calcium: 9.4 mg/dL (ref 8.6–10.2)
GFR calc Af Amer: 75 mL/min/{1.73_m2} (ref >59)
GFR calc non Af Amer: 65 mL/min/{1.73_m2} (ref >59)
GLUCOSE: 123 mg/dL — AB (ref 65–99)
Globulin, Total: 2.1 g/dL (ref 1.5–4.5)
Potassium: 4.6 mmol/L (ref 3.5–5.2)
Sodium: 141 mmol/L (ref 134–144)
Total Protein: 6.5 g/dL (ref 6.0–8.5)

## 2015-10-12 LAB — CBC WITH DIFFERENTIAL/PLATELET
BASOS ABS: 0.1 10*3/uL (ref 0.0–0.2)
Basos: 1 %
EOS (ABSOLUTE): 0.2 10*3/uL (ref 0.0–0.4)
Eos: 4 %
Hematocrit: 40.2 % (ref 37.5–51.0)
Hemoglobin: 13.8 g/dL (ref 12.6–17.7)
Immature Grans (Abs): 0 10*3/uL (ref 0.0–0.1)
Immature Granulocytes: 0 %
LYMPHS ABS: 1.9 10*3/uL (ref 0.7–3.1)
LYMPHS: 33 %
MCH: 29.2 pg (ref 26.6–33.0)
MCHC: 34.3 g/dL (ref 31.5–35.7)
MCV: 85 fL (ref 79–97)
Monocytes Absolute: 0.4 10*3/uL (ref 0.1–0.9)
Monocytes: 7 %
NEUTROS ABS: 3.2 10*3/uL (ref 1.4–7.0)
Neutrophils: 55 %
Platelets: 165 10*3/uL (ref 150–379)
RBC: 4.73 x10E6/uL (ref 4.14–5.80)
RDW: 13.8 % (ref 12.3–15.4)
WBC: 5.7 10*3/uL (ref 3.4–10.8)

## 2015-10-12 LAB — LIPID PANEL
CHOLESTEROL TOTAL: 115 mg/dL (ref 100–199)
Chol/HDL Ratio: 2.8 ratio units (ref 0.0–5.0)
HDL: 41 mg/dL (ref >39)
LDL Calculated: 50 mg/dL (ref 0–99)
TRIGLYCERIDES: 119 mg/dL (ref 0–149)
VLDL CHOLESTEROL CAL: 24 mg/dL (ref 5–40)

## 2015-10-24 ENCOUNTER — Ambulatory Visit (INDEPENDENT_AMBULATORY_CARE_PROVIDER_SITE_OTHER): Payer: Medicare Other | Admitting: Family Medicine

## 2015-10-24 ENCOUNTER — Encounter: Payer: Self-pay | Admitting: Family Medicine

## 2015-10-24 VITALS — BP 137/75 | HR 93 | Temp 97.9°F | Ht 63.0 in | Wt 169.2 lb

## 2015-10-24 DIAGNOSIS — J441 Chronic obstructive pulmonary disease with (acute) exacerbation: Secondary | ICD-10-CM

## 2015-10-24 MED ORDER — AZITHROMYCIN 250 MG PO TABS: ORAL_TABLET | ORAL | Status: DC

## 2015-10-24 MED ORDER — PREDNISONE 20 MG PO TABS
ORAL_TABLET | ORAL | Status: DC
Start: 2015-10-24 — End: 2015-12-13

## 2015-10-24 NOTE — Progress Notes (Signed)
BP 137/75 mmHg  Pulse 93  Temp(Src) 97.9 F (36.6 C) (Oral)  Ht  (1.6 m)  Wt 169 lb 3.2 oz (76.749 kg)  BMI 29.98 kg/m2  SpO2 97%   Subjective:    Patient ID: Dean Harris, male    DOB: 12/20/1940, 75 y.o.   MRN: [id number]  HPI: Dean Harris is a 75 y.o. male presenting on 10/24/2015 for Chest congestion; Cough; and Wheezing   HPI Chest congestion and cough and wheezing Patient has known COPD and has been having increased shortness of breath and cough and wheezing over the past 2 weeks. His cough has been productive of yellow sputum. He denies any fevers or chills. He normally is on Advair and Combivent for his asthma and he has been taking both and doesn't feel like the Combivent has been helping as much as it usually does. He is also having sinus congestion and postnasal drainage along with this.  Relevant past medical, surgical, family and social history reviewed and updated as indicated. Interim medical history since our last visit reviewed. Allergies and medications reviewed and updated.  Review of Systems  Constitutional: Positive for fever. Negative for chills.  HENT: Positive for congestion, postnasal drip, rhinorrhea, sinus pressure and sore throat. Negative for ear discharge, ear pain, sneezing and voice change.   Eyes: Negative for pain, discharge, redness and visual disturbance.  Respiratory: Positive for cough and shortness of breath. Negative for wheezing.   Cardiovascular: Negative for chest pain and leg swelling.  Gastrointestinal: Negative for abdominal pain, diarrhea and constipation.  Genitourinary: Negative for difficulty urinating.  Musculoskeletal: Negative for back pain and gait problem.  Skin: Negative for rash.  Neurological: Negative for syncope, light-headedness and headaches.  All other systems reviewed and are negative.   Per HPI unless specifically indicated above     Medication List       This list is accurate as of: 10/24/15  4:22  PM.  Always use your most recent med list.               albuterol-ipratropium 18-103 MCG/ACT inhaler  Commonly known as:  COMBIVENT  Inhale 1 puff into the lungs every 6 (six) hours as needed.     aspirin 325 MG tablet  Take 325 mg by mouth daily.     azithromycin 250 MG tablet  Commonly known as:  ZITHROMAX  Take 2 the first day and then one each day after.     Fluticasone-Salmeterol 100-50 MCG/DOSE Aepb  Commonly known as:  ADVAIR DISKUS  Inhale 1 puff into the lungs every 12 (twelve) hours.     glucose blood test strip  Commonly known as:  FREESTYLE LITE  Test 1X per day and prn   Dx. 250.02     isosorbide mononitrate 30 MG 24 hr tablet  Commonly known as:  IMDUR  Take 1 tablet (30 mg total) by mouth daily.     lisinopril 10 MG tablet  Commonly known as:  PRINIVIL,ZESTRIL  Take 1 tablet (10 mg total) by mouth daily.     metFORMIN 500 MG tablet  Commonly known as:  GLUCOPHAGE  Take 0.5 tablets (250 mg total) by mouth 2 (two) times daily with a meal.     predniSONE 20 MG tablet  Commonly known as:  DELTASONE  2 po at same time daily for 5 days     simvastatin 40 MG tablet  Commonly known as:  ZOCOR  Take 1 tablet (40 mg total)  by mouth every evening.     travoprost (benzalkonium) 0.004 % ophthalmic solution  Commonly known as:  TRAVATAN  1 drop at bedtime.     Vitamin D 2000 units tablet  Take 2,000 Units by mouth daily.           Objective:    BP 137/75 mmHg  Pulse 93  Temp(Src) 97.9 F (36.6 C) (Oral)  Ht  (1.6 m)  Wt 169 lb 3.2 oz (76.749 kg)  BMI 29.98 kg/m2  SpO2 97%  Wt Readings from Last 3 Encounters:  10/24/15 169 lb 3.2 oz (76.749 kg)  10/11/15 169 lb (76.658 kg)  07/09/15 173 lb (78.472 kg)    Physical Exam  Constitutional: He is oriented to person, place, and time. He appears well-developed and well-nourished. No distress.  HENT:  Right Ear: Tympanic membrane, external ear and ear canal normal.  Left Ear: Tympanic membrane,  external ear and ear canal normal.  Nose: Mucosal edema and rhinorrhea present. No sinus tenderness. No epistaxis. Right sinus exhibits maxillary sinus tenderness. Right sinus exhibits no frontal sinus tenderness. Left sinus exhibits maxillary sinus tenderness. Left sinus exhibits no frontal sinus tenderness.  Mouth/Throat: Uvula is midline and mucous membranes are normal. Posterior oropharyngeal edema and posterior oropharyngeal erythema present. No oropharyngeal exudate or tonsillar abscesses.  Eyes: Conjunctivae and EOM are normal. Pupils are equal, round, and reactive to light. Right eye exhibits no discharge. No scleral icterus.  Neck: Neck supple. No thyromegaly present.  Cardiovascular: Normal rate, regular rhythm, normal heart sounds and intact distal pulses.   No murmur heard. Pulmonary/Chest: Effort normal. No respiratory distress. He has wheezes. He has no rales. He exhibits no tenderness.  Musculoskeletal: Normal range of motion. He exhibits no edema.  Lymphadenopathy:    He has no cervical adenopathy.  Neurological: He is alert and oriented to person, place, and time. Coordination normal.  Skin: Skin is warm and dry. No rash noted. He is not diaphoretic.  Psychiatric: He has a normal mood and affect. His behavior is normal.  Vitals reviewed.      Assessment & Plan:   Problem List Items Addressed This Visit    None    Visit Diagnoses    COPD exacerbation (HCC)    -  Primary    Relevant Medications    predniSONE (DELTASONE) 20 MG tablet    azithromycin (ZITHROMAX) 250 MG tablet        Follow up plan: Return if symptoms worsen or fail to improve.  Counseling provided for all of the vaccine components No orders of the defined types were placed in this encounter.    Arville Care, MD [facility] 10/24/2015, 4:22 PM

## 2015-12-13 ENCOUNTER — Encounter (HOSPITAL_COMMUNITY): Payer: Medicare Other | Attending: Hematology & Oncology | Admitting: Hematology & Oncology

## 2015-12-13 ENCOUNTER — Encounter (HOSPITAL_COMMUNITY): Payer: Self-pay | Admitting: Hematology & Oncology

## 2015-12-13 ENCOUNTER — Encounter (HOSPITAL_COMMUNITY): Payer: Medicare Other

## 2015-12-13 VITALS — BP 131/88 | HR 59 | Temp 97.5°F | Resp 18 | Wt 166.0 lb

## 2015-12-13 DIAGNOSIS — M109 Gout, unspecified: Secondary | ICD-10-CM | POA: Insufficient documentation

## 2015-12-13 DIAGNOSIS — G4730 Sleep apnea, unspecified: Secondary | ICD-10-CM | POA: Diagnosis not present

## 2015-12-13 DIAGNOSIS — I10 Essential (primary) hypertension: Secondary | ICD-10-CM | POA: Insufficient documentation

## 2015-12-13 DIAGNOSIS — Z9889 Other specified postprocedural states: Secondary | ICD-10-CM | POA: Diagnosis not present

## 2015-12-13 DIAGNOSIS — J449 Chronic obstructive pulmonary disease, unspecified: Secondary | ICD-10-CM | POA: Diagnosis not present

## 2015-12-13 DIAGNOSIS — Z833 Family history of diabetes mellitus: Secondary | ICD-10-CM | POA: Diagnosis not present

## 2015-12-13 DIAGNOSIS — D696 Thrombocytopenia, unspecified: Secondary | ICD-10-CM

## 2015-12-13 DIAGNOSIS — Z87891 Personal history of nicotine dependence: Secondary | ICD-10-CM | POA: Diagnosis not present

## 2015-12-13 DIAGNOSIS — G629 Polyneuropathy, unspecified: Secondary | ICD-10-CM

## 2015-12-13 DIAGNOSIS — I2510 Atherosclerotic heart disease of native coronary artery without angina pectoris: Secondary | ICD-10-CM | POA: Diagnosis not present

## 2015-12-13 DIAGNOSIS — Z823 Family history of stroke: Secondary | ICD-10-CM | POA: Insufficient documentation

## 2015-12-13 DIAGNOSIS — Z809 Family history of malignant neoplasm, unspecified: Secondary | ICD-10-CM | POA: Diagnosis not present

## 2015-12-13 DIAGNOSIS — E559 Vitamin D deficiency, unspecified: Secondary | ICD-10-CM | POA: Diagnosis not present

## 2015-12-13 DIAGNOSIS — E1142 Type 2 diabetes mellitus with diabetic polyneuropathy: Secondary | ICD-10-CM | POA: Insufficient documentation

## 2015-12-13 DIAGNOSIS — E7800 Pure hypercholesterolemia, unspecified: Secondary | ICD-10-CM | POA: Insufficient documentation

## 2015-12-13 LAB — CBC WITH DIFFERENTIAL/PLATELET
Basophils Absolute: 0.1 10*3/uL (ref 0.0–0.1)
Basophils Relative: 1 %
EOS ABS: 0.2 10*3/uL (ref 0.0–0.7)
EOS PCT: 4 %
HCT: 39.3 % (ref 39.0–52.0)
Hemoglobin: 13.3 g/dL (ref 13.0–17.0)
LYMPHS ABS: 2.3 10*3/uL (ref 0.7–4.0)
LYMPHS PCT: 39 %
MCH: 29 pg (ref 26.0–34.0)
MCHC: 33.8 g/dL (ref 30.0–36.0)
MCV: 85.6 fL (ref 78.0–100.0)
MONO ABS: 0.5 10*3/uL (ref 0.1–1.0)
MONOS PCT: 8 %
Neutro Abs: 2.8 10*3/uL (ref 1.7–7.7)
Neutrophils Relative %: 48 %
PLATELETS: 123 10*3/uL — AB (ref 150–400)
RBC: 4.59 MIL/uL (ref 4.22–5.81)
RDW: 13.5 % (ref 11.5–15.5)
WBC: 5.9 10*3/uL (ref 4.0–10.5)

## 2015-12-13 NOTE — Patient Instructions (Addendum)
[facility] at [facility] Discharge Instructions  RECOMMENDATIONS MADE BY THE CONSULTANT AND ANY TEST RESULTS WILL BE SENT TO YOUR REFERRING PHYSICIAN.   Exam and discussion by Dr Galen ManilaPenland today Platelets 123,000 Labs stable  Go back to your cardiologist for your murmur, or if your primary care doctor watches it  If your dizziness comes back to the degree that was with it, let your primary care doctor know If you get any abnormal bleeding, or bruising or your trunk please let us know. Return to see the doctor in 1 year with labs  Please call the clinic if you have any questions or concerns      Thank you for choosing [facility] at [facility] to provide your oncology and hematology care.  To afford each patient quality time with our provider, please arrive at least 15 minutes before your scheduled appointment time.   Beginning January 23rd 2017 lab work for the [facility] will be done in the  Main lab at [facility] on 1st floor. If you have a lab appointment with the Cancer Center please come in thru the  Main Entrance and check in at the main information desk  You need to re-schedule your appointment should you arrive 10 or more minutes late.  We strive to give you quality time with our providers, and arriving late affects you and other patients whose appointments are after yours.  Also, if you no show three or more times for appointments you may be dismissed from the clinic at the providers discretion.     Again, thank you for choosing [facility].  Our hope is that these requests will decrease the amount of time that you wait before being seen by our physicians.       _____________________________________________________________  Should you have questions after your visit to [facility], please contact our office at [phone] between the hours of 8:30 a.m. and 4:30 p.m.  Voicemails left after 4:30 p.m.  will not be returned until the following business day.  For prescription refill requests, have your pharmacy contact our office.         Resources For Cancer Patients and their Caregivers ? American Cancer Society: Can assist with transportation, wigs, general needs, runs Look Good Feel Better.        [phone] ? Cancer Care: Provides financial assistance, online support groups, medication/co-pay assistance.  1-800-813-HOPE 646-729-3967(4673) ? [facility] Assists [place] cancer patients and their families through emotional , educational and financial support.  [phone] ? [place] DSS Where to apply for food stamps, Medicaid and utility assistance. 321-343-7250478-656-3475 ? [facility]: Transportation to medical appointments. ([phone] 418-2539734-220-8960 ? Social Security Administration: May apply for disability if have a Stage IV cancer. [phone] ? [place] [employer], Dis[employer] and Transit Services: Assists with nutrition, care and transit needs. [phone]

## 2015-12-13 NOTE — Progress Notes (Signed)
Dean PieriniMARTIN,[name], FNP   DIAGNOSIS: Chronic Thrombocytopenia  CURRENT THERAPY: Observation  INTERVAL HISTORY:   Dean Harris 75 y.o. male returns for evaluation of mild chronic thrombocytopenia. Platelet count today is 137K.  He denies any major new complaints.  Neuropathy has been an ongoing issue for him.  He denies any new bruising or bleeding.    Dean Harris was here with his wife today.  Last week he woke up and felt dizzy. His wife said that he lifted his head and he could immediately feel it. He said that he was fine after a little while, but his wife said that he was fine after he threw up and had diarrhea. He thought that this was a virus and did not feel like vertigo that he had a couple of years ago.  It has resolved and not recurred.   His appetite is good.  He has had no problems with bleeding or bruising.   His blood counts are stable  [place] grand [place] [facility].   Heard a murmur during the PE. Said that he has seen a cardiologist for this but has not seen them in a couple of years. He has angina.   MEDICAL HISTORY: Past Medical History  Diagnosis Date  . Pneumonia     x2  . High cholesterol   . Diabetes mellitus   . CAD (coronary artery disease)   . Hypertension   . Vitamin D deficiency     has Sleep apnea; Gout; Diabetes (HCC); Hyperlipidemia; Rosacea; COPD (chronic obstructive pulmonary disease) (HCC); ED (erectile dysfunction); Peripheral neuropathy (HCC); CAD (coronary artery disease); Essential hypertension, benign; Thrombocytopenia (HCC); and BMI 30.0-30.9,adult on his problem list.     is allergic to dust mite extract; mold extract; pollen extract; and tetracyclines & related.  @MEDADMINPROSE @  SURGICAL HISTORY: Past Surgical History  Procedure Laterality Date  . Carotid endarterectomy    . Left heart catheterization with coronary angiogram N/A 02/13/2012    Procedure:  LEFT HEART CATHETERIZATION WITH CORONARY ANGIOGRAM;  Surgeon: Donato SchultzMark Skains, MD;  [facility];  Service: Cardiovascular;  Laterality: N/A;    SOCIAL HISTORY: Social History   Social History  . Marital Status: Married    Spouse Name: N/A  . Number of Children: N/A  . Years of Education: N/A   Occupational History  . Not on file.   Social History Main Topics  . Smoking status: Former Smoker    Types: Cigarettes    Quit date: 02/10/1988  . Smokeless tobacco: Not on file  . Alcohol Use: No  . Drug Use: No  . Sexual Activity: Not on file   Other Topics Concern  . Not on file   Social History Narrative    FAMILY HISTORY: Family History  Problem Relation Age of Onset  . Diabetes Mother   . Stroke Mother   . Hypertension Mother   . Hyperlipidemia Mother   . Cancer Father   . Hypertension Brother   . Cancer Other     Review of Systems  Constitutional: Negative.  Negative for fever, chills, weight loss and malaise/fatigue.  HENT: Negative.  Negative for congestion, hearing loss, nosebleeds, sore throat and tinnitus.   Eyes: Negative.  Negative for blurred vision, double vision, pain and discharge.  Respiratory: Negative.  Negative for cough, hemoptysis, sputum production, shortness of breath and wheezing.   Cardiovascular: Negative.  Negative for chest pain, palpitations, claudication,  leg swelling and PND.  Gastrointestinal: Negative.  Negative for heartburn, nausea, vomiting, abdominal pain, diarrhea, constipation, blood in stool and melena.  Genitourinary: Negative.  Negative for dysuria, urgency, frequency and hematuria.  Musculoskeletal: Negative for myalgias, joint pain and falls.  Skin: Negative.  Negative for itching and rash.  Neurological: Negative for dizziness, tingling, tremors, sensory change, speech change, focal weakness, seizures, loss of consciousness, weakness and headaches.  Endo/Heme/Allergies: Negative.  Does not bruise/bleed easily.    Psychiatric/Behavioral: Negative.  Negative for depression, suicidal ideas, memory loss and substance abuse. The patient is not nervous/anxious and does not have insomnia.     PHYSICAL EXAMINATION  ECOG PERFORMANCE STATUS: 0 - Asymptomatic  Filed Vitals:   12/13/15 1100  BP: 131/88  Pulse: 59  Temp: 97.5 F (36.4 C)  Resp: 18    Physical Exam  Constitutional: He is oriented to person, place, and time and well-developed, well-nourished, and in no distress.  HENT:  Head: Normocephalic and atraumatic.  Nose: Nose normal.  Mouth/Throat: Oropharynx is clear and moist. No oropharyngeal exudate.  Eyes: Conjunctivae and EOM are normal. Pupils are equal, round, and reactive to light. Right eye exhibits no discharge. Left eye exhibits no discharge. No scleral icterus.  Neck: Normal range of motion. Neck supple. No tracheal deviation present. No thyromegaly present.  Cardiovascular: Normal rate and regular rhythm.  Exam reveals no gallop and no friction rub.   Murmur heard. Pulmonary/Chest: Effort normal and breath sounds normal. He has no wheezes. He has no rales.  Abdominal: Soft. Bowel sounds are normal. He exhibits no distension and no mass. There is no tenderness. There is no rebound and no guarding.  Musculoskeletal: Normal range of motion. He exhibits no edema.  Lymphadenopathy:    He has no cervical adenopathy.  Neurological: He is alert and oriented to person, place, and time. He has normal reflexes. No cranial nerve deficit. Gait normal. Coordination normal.  Skin: Skin is warm and dry. No rash noted.  Psychiatric: Mood, memory, affect and judgment normal.  Nursing note and vitals reviewed.   LABORATORY DATA: I have reviewed the data as listed.    CBC    Component Value Date/Time   WBC 5.9 12/13/2015 1057   WBC 5.7 10/11/2015 1018   WBC 5.9 04/25/2014 1149   RBC 4.59 12/13/2015 1057   RBC 4.73 10/11/2015 1018   RBC 4.6* 04/25/2014 1149   HGB 13.3 12/13/2015 1057    HGB 13.5* 04/25/2014 1149   HCT 39.3 12/13/2015 1057   HCT 40.2 10/11/2015 1018   HCT 39.7* 04/25/2014 1149   PLT 123* 12/13/2015 1057   PLT 165 10/11/2015 1018   MCV 85.6 12/13/2015 1057   MCV 85 10/11/2015 1018   MCV 86.4 04/25/2014 1149   MCH 29.0 12/13/2015 1057   MCH 29.2 10/11/2015 1018   MCH 29.4 04/25/2014 1149   MCHC 33.8 12/13/2015 1057   MCHC 34.3 10/11/2015 1018   MCHC 34.0 04/25/2014 1149   RDW 13.5 12/13/2015 1057   RDW 13.8 10/11/2015 1018   LYMPHSABS 2.3 12/13/2015 1057   LYMPHSABS 1.9 10/11/2015 1018   MONOABS 0.5 12/13/2015 1057   EOSABS 0.2 12/13/2015 1057   EOSABS 0.2 10/11/2015 1018   BASOSABS 0.1 12/13/2015 1057   BASOSABS 0.1 10/11/2015 1018   CMP     Component Value Date/Time   NA 141 10/11/2015 1018   NA 137 05/11/2014 1519   K 4.6 10/11/2015 1018   CL 102 10/11/2015 1018   CO2 25  10/11/2015 1018   GLUCOSE 123* 10/11/2015 1018   GLUCOSE 102* 05/11/2014 1519   BUN 15 10/11/2015 1018   BUN 17 05/11/2014 1519   CREATININE 1.13 10/11/2015 1018   CREATININE 1.14 02/09/2013 0954   CALCIUM 9.4 10/11/2015 1018   PROT 6.5 10/11/2015 1018   PROT 7.2 05/11/2014 1519   ALBUMIN 4.4 10/11/2015 1018   ALBUMIN 4.4 05/11/2014 1519   AST 18 10/11/2015 1018   ALT 19 10/11/2015 1018   ALKPHOS 49 10/11/2015 1018   BILITOT 1.8* 10/11/2015 1018   BILITOT 1.7* 07/25/2014 0913   GFRNONAA 65 10/11/2015 1018   GFRNONAA 65 02/09/2013 0954   GFRAA 75 10/11/2015 1018   GFRAA 75 02/09/2013 0954   ASSESSMENT and THERAPY PLAN:  Chronic mild thrombocytopenia Neuropathy  We spent time today discussing the possible causes of his mild thrombocytopenia.  His counts are certainly stable to improved.  He has no stigmata of bleeding.  He is followed regularly by his PCP and has routine labs on a fairly regular basis because of his other medical comorbidities.  We will continue with yearly follow-ups. I again reviewed symptoms of concern such as bruising that is unusual or  new, specifically on the trunk or back. We also discussedbleeding or gum bleeding. I advised him that at any time he develops any of these symptoms to notify us and we will bring him and promptly for a CBC and visit. I suspect his mild thrombocytopenia may be immune mediated or could also be medication related. We will see him back yearly. He agrees with this plan.  All questions were answered. The patient knows to call the clinic with any problems, questions or concerns. We can certainly see the patient much sooner if necessary. This note was electronically signed.  This document serves as a record of services personally performed by Loma Messing, MD. It was created on her behalf by Milas Hock, a trained medical scribe. The creation of this record is based on the scribe's personal observations and the provider's statements to them. This document has been checked and approved by the attending provider.  I have reviewed the above documentation for accuracy and completeness, and I agree with the above.  This note was electronically signed.   Novella Olive. Aleeya Veitch MD

## 2016-01-15 LAB — HM DIABETES EYE EXAM

## 2016-01-17 ENCOUNTER — Emergency Department (HOSPITAL_COMMUNITY): Payer: Medicare Other

## 2016-01-17 ENCOUNTER — Emergency Department (HOSPITAL_COMMUNITY)
Admission: EM | Admit: 2016-01-17 | Discharge: 2016-01-17 | Disposition: A | Discharge status: Home / Self Care | Payer: Medicare Other | Attending: Emergency Medicine | Admitting: Emergency Medicine

## 2016-01-17 ENCOUNTER — Ambulatory Visit (INDEPENDENT_AMBULATORY_CARE_PROVIDER_SITE_OTHER): Payer: Medicare Other | Admitting: Nurse Practitioner

## 2016-01-17 ENCOUNTER — Encounter: Payer: Self-pay | Admitting: Nurse Practitioner

## 2016-01-17 ENCOUNTER — Encounter (HOSPITAL_COMMUNITY): Payer: Self-pay

## 2016-01-17 VITALS — BP 115/69 | HR 68 | Temp 96.7°F | Ht 63.0 in | Wt 162.8 lb

## 2016-01-17 DIAGNOSIS — E785 Hyperlipidemia, unspecified: Secondary | ICD-10-CM | POA: Diagnosis not present

## 2016-01-17 DIAGNOSIS — I10 Essential (primary) hypertension: Secondary | ICD-10-CM | POA: Insufficient documentation

## 2016-01-17 DIAGNOSIS — R011 Cardiac murmur, unspecified: Secondary | ICD-10-CM | POA: Insufficient documentation

## 2016-01-17 DIAGNOSIS — Z7951 Long term (current) use of inhaled steroids: Secondary | ICD-10-CM | POA: Diagnosis not present

## 2016-01-17 DIAGNOSIS — Z8701 Personal history of pneumonia (recurrent): Secondary | ICD-10-CM | POA: Diagnosis not present

## 2016-01-17 DIAGNOSIS — Z9889 Other specified postprocedural states: Secondary | ICD-10-CM | POA: Diagnosis not present

## 2016-01-17 DIAGNOSIS — Z6830 Body mass index (BMI) 30.0-30.9, adult: Secondary | ICD-10-CM | POA: Diagnosis not present

## 2016-01-17 DIAGNOSIS — E559 Vitamin D deficiency, unspecified: Secondary | ICD-10-CM | POA: Diagnosis not present

## 2016-01-17 DIAGNOSIS — D696 Thrombocytopenia, unspecified: Secondary | ICD-10-CM | POA: Diagnosis not present

## 2016-01-17 DIAGNOSIS — J42 Unspecified chronic bronchitis: Secondary | ICD-10-CM

## 2016-01-17 DIAGNOSIS — E119 Type 2 diabetes mellitus without complications: Secondary | ICD-10-CM | POA: Diagnosis not present

## 2016-01-17 DIAGNOSIS — Z7984 Long term (current) use of oral hypoglycemic drugs: Secondary | ICD-10-CM | POA: Diagnosis not present

## 2016-01-17 DIAGNOSIS — Z79899 Other long term (current) drug therapy: Secondary | ICD-10-CM | POA: Insufficient documentation

## 2016-01-17 DIAGNOSIS — G6289 Other specified polyneuropathies: Secondary | ICD-10-CM

## 2016-01-17 DIAGNOSIS — E1142 Type 2 diabetes mellitus with diabetic polyneuropathy: Secondary | ICD-10-CM

## 2016-01-17 DIAGNOSIS — I25119 Atherosclerotic heart disease of native coronary artery with unspecified angina pectoris: Secondary | ICD-10-CM | POA: Diagnosis not present

## 2016-01-17 DIAGNOSIS — Z7982 Long term (current) use of aspirin: Secondary | ICD-10-CM | POA: Insufficient documentation

## 2016-01-17 DIAGNOSIS — I2510 Atherosclerotic heart disease of native coronary artery without angina pectoris: Secondary | ICD-10-CM

## 2016-01-17 DIAGNOSIS — R55 Syncope and collapse: Secondary | ICD-10-CM | POA: Diagnosis present

## 2016-01-17 DIAGNOSIS — E7800 Pure hypercholesterolemia, unspecified: Secondary | ICD-10-CM | POA: Diagnosis not present

## 2016-01-17 DIAGNOSIS — Z87891 Personal history of nicotine dependence: Secondary | ICD-10-CM | POA: Insufficient documentation

## 2016-01-17 DIAGNOSIS — I9589 Other hypotension: Secondary | ICD-10-CM | POA: Diagnosis not present

## 2016-01-17 DIAGNOSIS — I959 Hypotension, unspecified: Secondary | ICD-10-CM

## 2016-01-17 LAB — BAYER DCA HB A1C WAIVED: HB A1C (BAYER DCA - WAIVED): 6.5 % (ref <7.0)

## 2016-01-17 LAB — CBC WITH DIFFERENTIAL/PLATELET
BASOS ABS: 0 10*3/uL (ref 0.0–0.1)
BASOS PCT: 0 %
Eosinophils Absolute: 0.1 10*3/uL (ref 0.0–0.7)
Eosinophils Relative: 1 %
HEMATOCRIT: 38.9 % — AB (ref 39.0–52.0)
HEMOGLOBIN: 12.9 g/dL — AB (ref 13.0–17.0)
LYMPHS PCT: 29 %
Lymphs Abs: 1.3 10*3/uL (ref 0.7–4.0)
MCH: 28.2 pg (ref 26.0–34.0)
MCHC: 33.2 g/dL (ref 30.0–36.0)
MCV: 84.9 fL (ref 78.0–100.0)
MONOS PCT: 7 %
Monocytes Absolute: 0.3 10*3/uL (ref 0.1–1.0)
NEUTROS ABS: 2.8 10*3/uL (ref 1.7–7.7)
Neutrophils Relative %: 63 %
Platelets: 89 10*3/uL — ABNORMAL LOW (ref 150–400)
RBC: 4.58 MIL/uL (ref 4.22–5.81)
RDW: 13.4 % (ref 11.5–15.5)
WBC: 4.4 10*3/uL (ref 4.0–10.5)

## 2016-01-17 LAB — URINALYSIS, ROUTINE W REFLEX MICROSCOPIC
BILIRUBIN URINE: NEGATIVE
GLUCOSE, UA: NEGATIVE mg/dL
Hgb urine dipstick: NEGATIVE
KETONES UR: NEGATIVE mg/dL
LEUKOCYTES UA: NEGATIVE
Nitrite: NEGATIVE
PH: 6 (ref 5.0–8.0)
Protein, ur: NEGATIVE mg/dL
Specific Gravity, Urine: 1.023 (ref 1.005–1.030)

## 2016-01-17 LAB — I-STAT TROPONIN, ED: TROPONIN I, POC: 0.01 ng/mL (ref 0.00–0.08)

## 2016-01-17 LAB — BASIC METABOLIC PANEL
ANION GAP: 9 (ref 5–15)
BUN: 24 mg/dL — ABNORMAL HIGH (ref 6–20)
CHLORIDE: 106 mmol/L (ref 101–111)
CO2: 24 mmol/L (ref 22–32)
Calcium: 8.4 mg/dL — ABNORMAL LOW (ref 8.9–10.3)
Creatinine, Ser: 1.12 mg/dL (ref 0.61–1.24)
GFR calc non Af Amer: >60 mL/min (ref >60)
Glucose, Bld: 146 mg/dL — ABNORMAL HIGH (ref 65–99)
Potassium: 4.8 mmol/L (ref 3.5–5.1)
Sodium: 139 mmol/L (ref 135–145)

## 2016-01-17 LAB — CBG MONITORING, ED: Glucose-Capillary: 136 mg/dL — ABNORMAL HIGH (ref 65–99)

## 2016-01-17 LAB — TYPE AND SCREEN
ABO/RH(D): O POS
ANTIBODY SCREEN: NEGATIVE

## 2016-01-17 LAB — PROTIME-INR
INR: 1.12 (ref 0.00–1.49)
Prothrombin Time: 14.6 seconds (ref 11.6–15.2)

## 2016-01-17 LAB — ABO/RH: ABO/RH(D): O POS

## 2016-01-17 MED ORDER — SODIUM CHLORIDE 0.9 % IV BOLUS (SEPSIS)
1000.0000 mL | Freq: Once | INTRAVENOUS | Status: AC
  Administered 2016-01-17: 1000 mL via INTRAVENOUS

## 2016-01-17 MED ORDER — SODIUM CHLORIDE 0.9 % IV SOLN: INTRAVENOUS | Status: DC

## 2016-01-17 NOTE — Progress Notes (Signed)
Subjective:    Patient ID: Dean Harris, male    DOB: 1941-03-07, 75 y.o.   MRN: [id number]   Patient here today for follow up of chronic medical problems.  Outpatient Encounter Prescriptions as of 01/17/2016  Medication Sig  . albuterol-ipratropium (COMBIVENT) 18-103 MCG/ACT inhaler Inhale 1 puff into the lungs every 6 (six) hours as needed.  Marland Kitchen aspirin 325 MG tablet Take 325 mg by mouth daily.  Marland Kitchen b complex vitamins tablet Take 1 tablet by mouth daily.  . Cholecalciferol (VITAMIN D) 2000 UNITS tablet Take 2,000 Units by mouth daily.   . Fluticasone-Salmeterol (ADVAIR DISKUS) 100-50 MCG/DOSE AEPB Inhale 1 puff into the lungs every 12 (twelve) hours.  Marland Kitchen glucose blood (FREESTYLE LITE) test strip Test 1X per day and prn   Dx. 250.02  . isosorbide mononitrate (IMDUR) 30 MG 24 hr tablet Take 1 tablet (30 mg total) by mouth daily.  Marland Kitchen lisinopril (PRINIVIL,ZESTRIL) 10 MG tablet Take 1 tablet (10 mg total) by mouth daily.  . metFORMIN (GLUCOPHAGE) 500 MG tablet Take 0.5 tablets (250 mg total) by mouth 2 (two) times daily with a meal.  . simvastatin (ZOCOR) 40 MG tablet Take 1 tablet (40 mg total) by mouth every evening.  . travoprost, benzalkonium, (TRAVATAN) 0.004 % ophthalmic solution 1 drop at bedtime.   No facility-administered encounter medications on file as of 01/17/2016.     Hyperlipidemia This is a chronic problem. The current episode started more than 1 year ago. The problem is controlled. Recent lipid tests were reviewed and are normal. Pertinent negatives include no chest pain or shortness of breath. Current antihyperlipidemic treatment includes statins. Risk factors for coronary artery disease include male sex, hypertension, dyslipidemia and diabetes mellitus.  Diabetes He presents for his follow-up diabetic visit. He has type 2 diabetes mellitus. No MedicAlert identification noted. His disease course has been stable. Pertinent negatives for hypoglycemia include no confusion, dizziness  or headaches. Pertinent negatives for diabetes include no chest pain, no fatigue, no polydipsia, no polyphagia, no polyuria and no visual change. Symptoms are stable. Risk factors for coronary artery disease include male sex, post-menopausal, dyslipidemia and diabetes mellitus. Current diabetic treatment includes oral agent (monotherapy). He is compliant with treatment all of the time. He sees a podiatrist.Eye exam is current.  Hypertension This is a chronic problem. The current episode started more than 1 year ago. The problem is unchanged. The problem is controlled. Pertinent negatives include no chest pain, headaches, palpitations or shortness of breath. Risk factors for coronary artery disease include dyslipidemia, male gender and diabetes mellitus. Past treatments include diuretics. There are no compliance problems.   CAD Sees cardiologist yearly. Currently on imdur. COPD combivent daily and advair- no recent flare ups- denies SOB Hx thrombocytopenia Has not had labs checked in awhile- no bruising  Neuropathy peripheral Numbness in feet most of time- mainly in toes- burning sensation on heels bil   Review of Systems  Constitutional: Negative.  Negative for fatigue.  HENT: Negative.   Respiratory: Negative.  Negative for shortness of breath.   Cardiovascular: Negative.  Negative for chest pain and palpitations.  Endocrine: Negative for polydipsia, polyphagia and polyuria.  Genitourinary: Negative.   Neurological: Negative for dizziness and headaches.  Psychiatric/Behavioral: Negative for confusion.       Objective:   Physical Exam  Constitutional: He is oriented to person, place, and time. He appears well-developed and well-nourished.  HENT:  Head: Normocephalic.  Eyes: Conjunctivae are normal. Pupils are equal, round, and reactive  to light.  Cardiovascular: Normal rate and regular rhythm.   Pulmonary/Chest: Effort normal and breath sounds normal. No respiratory distress. He has  no wheezes. He has no rales. He exhibits no tenderness.  Genitourinary:  No CVAtenderness  Musculoskeletal: Normal range of motion.  Neurological: He is alert and oriented to person, place, and time. He has normal reflexes.  Skin: Skin is warm.  Erythematous annular lesion on right upper inner thigh  Psychiatric: He has a normal mood and affect. His behavior is normal. Judgment and thought content normal.    BP 115/69 mmHg  Pulse 68  Temp(Src) 96.7 F (35.9 C) (Oral)  Ht 5\' 3"  (1.6 m)  Wt 162 lb 12.8 oz (73.846 kg)  BMI 28.85 kg/m2   * ( 1140 ) being examined he turned blue and fell back on bed and was unresponsive for about 10 seconds- when came around was alert and oriented- 83/55 hr 56- nero check normal- no weakness- cold and clammy ( 1145 ) 110/68  Hr 69 SAO2 100% RA ( 1148 ) 113/71 Hr 72 Sao2 94% RA- alert and oriented- CN intact- no ext weakness- mild cyanosis of lips. Head of bed elevated 45 degrees ( 1153 ) 102/69 HR 78 Sao2 100 % RA  ( 1156 ) 116/80 HR 74 SAo2 97% RA head elevated 90 degrees- EMS arrived ( 1158 ) 78/52 HR 67 SAo2 98%  Blood Sugar 166 Pale- feels dizzy " says feels weird "- alert and oriented. Cold and clammy ( 1200 ) 78/52 manual- Hr 56  Assessment & Plan:   1. Type 2 diabetes mellitus with diabetic polyneuropathy, without long-term current use of insulin (HCC)   2. Hyperlipidemia   3. Essential hypertension, benign   4. Coronary artery disease involving native coronary artery of native heart without angina pectoris   5. Chronic bronchitis, unspecified chronic bronchitis type (HCC)   6. Thrombocytopenia (HCC)   7. BMI 30.0-30.9,adult   8. Other polyneuropathy (HCC)   9. Syncope and collapse    TO ER for evaluation Continue all meds Follow up prn  Mary-Margaret Daphine DeutscherMartin, FNP

## 2016-01-17 NOTE — ED Notes (Signed)
Pt ambulated in hallway with no difficulty.  Pt denies dizziness, SOB or other symptoms with ambulation.

## 2016-01-17 NOTE — Discharge Instructions (Signed)
Hypotension  As your heart beats, it forces blood through your arteries. This force is your blood pressure. If your blood pressure is too low for you to go about your normal activities or to support the organs of your body, you have hypotension. Hypotension is also referred to as low blood pressure. When your blood pressure becomes too low, you may not get enough blood to your brain. As a result, you may feel weak, feel lightheaded, or develop a rapid heart rate. In a more severe case, you may faint.  CAUSES  Various conditions can cause hypotension. These include:  · Blood loss.  · Dehydration.  · Heart or endocrine problems.  · Pregnancy.  · Severe infection.  · Not having a well-balanced diet filled with needed nutrients.  · Severe allergic reactions (anaphylaxis).  Some medicines, such as blood pressure medicine or water pills (diuretics), may lower your blood pressure below normal. Sometimes taking too much medicine or taking medicine not as directed can cause hypotension.  TREATMENT   Hospitalization is sometimes required for hypotension if fluid or blood replacement is needed, if time is needed for medicines to wear off, or if further monitoring is needed. Treatment might include changing your diet, changing your medicines (including medicines aimed at raising your blood pressure), and use of support stockings.  HOME CARE INSTRUCTIONS   · Drink enough fluids to keep your urine clear or pale yellow.  · Take your medicines as directed by your health care provider.  · Get up slowly from reclining or sitting positions. This gives your blood pressure a chance to adjust.  · Wear support stockings as directed by your health care provider.  · Maintain a healthy diet by including nutritious food, such as fruits, vegetables, nuts, whole grains, and lean meats.  SEEK MEDICAL CARE IF:  · You have vomiting or diarrhea.  · You have a fever for more than 2-3 days.  · You feel more thirsty than usual.  · You feel weak and  tired.  SEEK IMMEDIATE MEDICAL CARE IF:   · You have chest pain or a fast or irregular heartbeat.  · You have a loss of feeling in some part of your body, or you lose movement in your arms or legs.  · You have trouble speaking.  · You become sweaty or feel lightheaded.  · You faint.  MAKE SURE YOU:   · Understand these instructions.  · Will watch your condition.  · Will get help right away if you are not doing well or get worse.     This information is not intended to replace advice given to you by your health care provider. Make sure you discuss any questions you have with your health care provider.     Document Released: 09/01/2005 Document Revised: 06/22/2013 Document Reviewed: 03/04/2013  Elsevier Interactive Patient Education ©2016 [employer]

## 2016-01-17 NOTE — ED Notes (Signed)
Pt brought in EMS from [facility] for hypotension and syncopal episode.  Pt was there for routine visit when he became very pale and had a syncopal episode lasting "a few seconds".  BP was 78/52 at time.  Pt given 300cc NS  PTA and last BP was 144/78.

## 2016-01-17 NOTE — ED Provider Notes (Signed)
CSN: [id number]     Arrival date & time 01/17/16  1306 History   First MD Initiated Contact with Patient 01/17/16 1320     Chief Complaint  Patient presents with  . Loss of Consciousness  . Hypotension   HPI Patient presents to the emergency room for evaluation of a syncopal episode. Patient was at his doctor's office for a routine visit. Patient states he generally been feeling well over the previous week he had been having respiratory symptoms associated with cough and congestion. At the doctor's office during his evaluation he had an episode where he suddenly became very pale.  In the office patient's blood pressure dropped and was measured at 78/52. Patient was given 300 mL of normal saline and his blood pressure has improved. His symptoms have all resolved at this point. Patient denies having any trouble chest pain. Denies any headache. Denies any abdominal.. No vomiting or diarrhea. No blood in the stool. Patient does have a history of coronary artery disease as well as aortic stenosis. Past Medical History  Diagnosis Date  . Pneumonia     x2  . High cholesterol   . Diabetes mellitus   . CAD (coronary artery disease)   . Hypertension   . Vitamin D deficiency   . Angina pectoris [facility])    Past Surgical History  Procedure Laterality Date  . Carotid endarterectomy    . Left heart catheterization with coronary angiogram N/A 02/13/2012    Procedure: LEFT HEART CATHETERIZATION WITH CORONARY ANGIOGRAM;  Surgeon: Donato SchultzMark Skains, MD;  [facility];  Service: Cardiovascular;  Laterality: N/A;   Family History  Problem Relation Age of Onset  . Diabetes Mother   . Stroke Mother   . Hypertension Mother   . Hyperlipidemia Mother   . Cancer Father   . Hypertension Brother   . Cancer Other    Social History  Substance Use Topics  . Smoking status: Former Smoker    Types: Cigarettes    Quit date: 02/10/1988  . Smokeless tobacco: None  . Alcohol Use: No    Review of Systems  All  other systems reviewed and are negative.     Allergies  Dust mite extract; Mold extract; Pollen extract; and Tetracyclines & related  Home Medications   Prior to Admission medications   Medication Sig Start Date End Date Taking? Authorizing Provider  albuterol-ipratropium (COMBIVENT) 18-103 MCG/ACT inhaler Inhale 1 puff into the lungs every 6 (six) hours as needed. 10/11/15  Yes Mary-Margaret Daphine DeutscherMartin, FNP  aspirin 325 MG tablet Take 325 mg by mouth daily.   Yes Historical Provider, MD  b complex vitamins tablet Take 1 tablet by mouth daily.   Yes Historical Provider, MD  Cholecalciferol (VITAMIN D) 2000 UNITS tablet Take 2,000 Units by mouth daily.    Yes Historical Provider, MD  Fluticasone-Salmeterol (ADVAIR DISKUS) 100-50 MCG/DOSE AEPB Inhale 1 puff into the lungs every 12 (twelve) hours. 10/11/15  Yes Mary-Margaret Daphine DeutscherMartin, FNP  glucose blood (FREESTYLE LITE) test strip Test 1X per day and prn   Dx. 250.02 08/22/13  Yes Mary-Margaret Daphine DeutscherMartin, FNP  isosorbide mononitrate (IMDUR) 30 MG 24 hr tablet Take 1 tablet (30 mg total) by mouth daily. 10/11/15  Yes Mary-Margaret Daphine DeutscherMartin, FNP  lisinopril (PRINIVIL,ZESTRIL) 10 MG tablet Take 1 tablet (10 mg total) by mouth daily. 10/11/15  Yes Mary-Margaret Daphine DeutscherMartin, FNP  metFORMIN (GLUCOPHAGE) 500 MG tablet Take 0.5 tablets (250 mg total) by mouth 2 (two) times daily with a meal. 10/11/15  Yes  [name], FNP  simvastatin (ZOCOR) 40 MG tablet Take 1 tablet (40 mg total) by mouth every evening. 10/11/15  Yes [name], FNP  travoprost, benzalkonium, (TRAVATAN) 0.004 % ophthalmic solution 1 drop at bedtime.   Yes Historical Provider, MD   BP 136/72 mmHg  Pulse 71  Temp(Src) 97.5 F (36.4 C) (Oral)  Resp 19  Ht 5\' 5"  (1.651 m)  Wt 72.576 kg  BMI 26.63 kg/m2  SpO2 99% Physical Exam  Constitutional: He appears well-developed. No distress.  HENT:  Head: Normocephalic and atraumatic.  Right Ear: External ear normal.  Left Ear: External  ear normal.  Eyes: Conjunctivae are normal. Right eye exhibits no discharge. Left eye exhibits no discharge. No scleral icterus.  Neck: Neck supple. No tracheal deviation present.  Cardiovascular: Normal rate, regular rhythm and intact distal pulses.   Murmur heard.  Systolic murmur is present with a grade of 3/6  Pulmonary/Chest: Effort normal and breath sounds normal. No stridor. No respiratory distress. He has no wheezes. He has no rales.  Abdominal: Soft. Bowel sounds are normal. He exhibits no distension. There is no tenderness. There is no rebound and no guarding.  Musculoskeletal: He exhibits no edema or tenderness.  Neurological: He is alert. He has normal strength. No cranial nerve deficit (no facial droop, extraocular movements intact, no slurred speech) or sensory deficit. He exhibits normal muscle tone. He displays no seizure activity. Coordination normal.  Skin: Skin is warm and dry. No rash noted. He is not diaphoretic.  Psychiatric: He has a normal mood and affect.  Nursing note and vitals reviewed.   ED Course  Procedures (including critical care time) Labs Review Labs Reviewed  CBC WITH DIFFERENTIAL/PLATELET - Abnormal; Notable for the following:    Hemoglobin 12.9 (*)    HCT 38.9 (*)    Platelets 89 (*)    All other components within normal limits  BASIC METABOLIC PANEL - Abnormal; Notable for the following:    Glucose, Bld 146 (*)    BUN 24 (*)    Calcium 8.4 (*)    All other components within normal limits  CBG MONITORING, ED - Abnormal; Notable for the following:    Glucose-Capillary 136 (*)    All other components within normal limits  PROTIME-INR  URINALYSIS, ROUTINE W REFLEX MICROSCOPIC (NOT AT [facility])  POCT CBG (FASTING - GLUCOSE)-MANUAL ENTRY  I-STAT TROPOININ, ED  TYPE AND SCREEN  ABO/RH    Imaging Review Dg Chest Port 1 View  01/17/2016  CLINICAL DATA:  Syncope/mouth turned blue at doctors office when trying to sit up, congestion, productive cough x  last Friday EXAM: PORTABLE CHEST 1 VIEW COMPARISON:  03/28/2014 FINDINGS: The heart size and mediastinal contours are within normal limits. Both lungs are clear. The visualized skeletal structures are unremarkable. IMPRESSION: No active disease. Electronically Signed   By: Esperanza Heir M.D.   On: 01/17/2016 14:05   I have personally reviewed and evaluated these images and lab results as part of my medical decision-making.   EKG Interpretation   Date/Time:  Thursday Jan 17 2016 14:22:34 EDT Ventricular Rate:  66 PR Interval:  172 QRS Duration: 94 QT Interval:  438 QTC Calculation: 459 R Axis:   72 Text Interpretation:  Normal sinus rhythm Normal ECG No significant change  since last tracing Confirmed by Janisa Labus  MD-J, Javen Ridings ([id number]) on 01/17/2016  2:35:36 PM      MDM   Final diagnoses:  Near syncope  Transient hypotension    Patient's  blood pressure improved while he was in the emergency room. His blood pressure has been stable and the most recent one is up to 136/72.  Patient had been nothing by mouth in anticipation of his doctor's office visit. It's possible that he was mildly dehydrated. He did take his regular blood pressure medications in the morning. This combination may be contributed to the transient hypotension.  Patient's laboratory tests are reassuring. X-ray and EKG are reassuring. Patient does have a systolic murmur and according to his medical records he has aortic stenosis. This was documented as mild on a prior echocardiogram several years ago. I suggested he follow-up with his cardiologist to consider doing a repeat echocardiogram. Patient ambulated in the emergency room. He feels well and like to go home.    Linwood Dibbles, MD 01/17/16 [phone]

## 2016-01-17 NOTE — ED Notes (Signed)
Pt given Mal[place] sandwich and coffee. Per order of Lynelle DoctorKnapp, MD.

## 2016-01-17 NOTE — ED Notes (Signed)
MD at the bedside  

## 2016-01-18 LAB — LIPID PANEL
CHOL/HDL RATIO: 3.6 ratio (ref 0.0–5.0)
Cholesterol, Total: 133 mg/dL (ref 100–199)
HDL: 37 mg/dL — ABNORMAL LOW (ref >39)
LDL Calculated: 69 mg/dL (ref 0–99)
Triglycerides: 136 mg/dL (ref 0–149)
VLDL Cholesterol Cal: 27 mg/dL (ref 5–40)

## 2016-01-18 LAB — CMP14+EGFR
ALK PHOS: 59 IU/L (ref 39–117)
ALT: 23 IU/L (ref 0–44)
AST: 22 IU/L (ref 0–40)
Albumin/Globulin Ratio: 1.9 (ref 1.2–2.2)
Albumin: 4.1 g/dL (ref 3.5–4.8)
BUN/Creatinine Ratio: 22 (ref 10–24)
BUN: 24 mg/dL (ref 8–27)
Bilirubin Total: 0.8 mg/dL (ref 0.0–1.2)
CALCIUM: 8.7 mg/dL (ref 8.6–10.2)
CHLORIDE: 99 mmol/L (ref 96–106)
CO2: 23 mmol/L (ref 18–29)
CREATININE: 1.09 mg/dL (ref 0.76–1.27)
GFR calc Af Amer: 78 mL/min/{1.73_m2} (ref >59)
GFR, EST NON AFRICAN AMERICAN: 67 mL/min/{1.73_m2} (ref >59)
GLOBULIN, TOTAL: 2.2 g/dL (ref 1.5–4.5)
GLUCOSE: 132 mg/dL — AB (ref 65–99)
POTASSIUM: 4.3 mmol/L (ref 3.5–5.2)
SODIUM: 137 mmol/L (ref 134–144)
Total Protein: 6.3 g/dL (ref 6.0–8.5)

## 2016-01-21 ENCOUNTER — Ambulatory Visit (INDEPENDENT_AMBULATORY_CARE_PROVIDER_SITE_OTHER): Payer: Medicare Other | Admitting: Cardiology

## 2016-01-21 ENCOUNTER — Encounter: Payer: Self-pay | Admitting: Cardiology

## 2016-01-21 VITALS — BP 152/82 | HR 80 | Ht 65.0 in | Wt 164.0 lb

## 2016-01-21 DIAGNOSIS — Z9889 Other specified postprocedural states: Secondary | ICD-10-CM | POA: Diagnosis not present

## 2016-01-21 DIAGNOSIS — G588 Other specified mononeuropathies: Secondary | ICD-10-CM

## 2016-01-21 DIAGNOSIS — I350 Nonrheumatic aortic (valve) stenosis: Secondary | ICD-10-CM | POA: Diagnosis not present

## 2016-01-21 DIAGNOSIS — I2510 Atherosclerotic heart disease of native coronary artery without angina pectoris: Secondary | ICD-10-CM

## 2016-01-21 DIAGNOSIS — I10 Essential (primary) hypertension: Secondary | ICD-10-CM

## 2016-01-21 DIAGNOSIS — E785 Hyperlipidemia, unspecified: Secondary | ICD-10-CM

## 2016-01-21 DIAGNOSIS — I208 Other forms of angina pectoris: Secondary | ICD-10-CM | POA: Diagnosis not present

## 2016-01-21 DIAGNOSIS — R55 Syncope and collapse: Secondary | ICD-10-CM

## 2016-01-21 MED ORDER — NITROGLYCERIN 0.4 MG SL SUBL: 25.0000 mg | SUBLINGUAL_TABLET | SUBLINGUAL | Status: DC | PRN

## 2016-01-21 NOTE — Patient Instructions (Signed)
NO CHANGE WITH CURRENT MEDICATIONS   Your physician has requested that you have a carotid duplex. This test is an ultrasound of the carotid arteries in your neck. It looks at blood flow through these arteries that supply the brain with blood. Allow one hour for this exam. There are no restrictions or special instructions.  Your physician has requested that you have an echocardiogram. Echocardiography is a painless test that uses sound waves to create images of your heart. It provides your doctor with information about the size and shape of your heart and how well your heart's chambers and valves are working. This procedure takes approximately one hour. There are no restrictions for this procedure.  Your physician wants you to follow-up inYou will receive a reminder letter in the mail two months in advance. If you don't receive a letter, please call our office to schedule the follow-up appointment.  Your physician wants you to follow-up in 6 MONTHS WITH DR HARDING.  You will receive a reminder letter in the mail two months in advance. If you don't receive a letter, please call our office to schedule the follow-up appointment.  If you need a refill on your cardiac medications before your next appointment, please call your pharmacy.

## 2016-01-21 NOTE — Progress Notes (Signed)
PCP: Bennie Pierini, FNP  Clinic Note: Chief Complaint  Patient presents with  . New Patient (Initial Visit)    Chest aches with exersion  . Near Syncope  . Chest Pain    History of moderate CAD with "chronic stable angina "    HPI: Dean Harris is a 75 y.o. male with a PMH below who presents today for f/u from ER EDP note: "Patient presents to the emergency room for evaluation of a syncopal episode. Patient was at his doctor's office for a routine visit. Patient states he generally been feeling well over the previous week he had been having respiratory symptoms associated with cough and congestion. At the doctor's office during his evaluation he had an episode where he suddenly became very pale. In the office patient's blood pressure dropped and was measured at 78/52. Patient was given 300 mL of normal saline and his blood pressure has improved. His symptoms have all resolved at this point. Patient denies having any trouble chest pain." => Symptoms are consistent with systemic hypotension with some vasovagal component in the setting of fasting for blood work.  Dean Harris was last seen in May 2013 for Angina by Dr. Verdis Prime in consultation when he is admitted for chest pain concerning for unstable angina.- Med Rx. He also has a history of bilateral carotid artery disease status post carotid endarterectomy after an episode of amaurosis fugax back in 2004. He also has hypertension, hyperlipidemia and diabetes mellitus on oral medications. He is now on Imdur for his consider chronic stable angina. Unfortunately he has not followed up with cardiology.  Recent Hospitalizations: ER visit from 01/17/2016 summarized above Symptoms are consistent with systemic hypotension with some vasovagal component in the setting of fasting for blood work. This is thought to be related to dehydration and on hypertensives. He took his blood pressure medications without having eaten or drank  anything.  Studies Reviewed:    CATH May 31/2013:   1. Moderate diffuse coronary artery disease-approximately 50% mid LAD (sharp bend to vessel but no bridging), 50% proximal first obtuse marginal branch, 50% tubular mid RCA. 2. Normal left ventricular systolic function. LVEDP 25 mmHg, diastolic dysfunction. Ejection fraction 65%. Minimal aortic valve gradient-no significant aortic stenosis.  RECOMMENDATION: I have discussed films with Dr. Verdis Prime, his consulting cardiologist and we will continue with medical management. Aspirin, statin, isosorbide mononitrate 30 mg once a day. If he continues to have ongoing discomfort, one may consider nuclear study to further evaluate for ischemia in the moderate CAD lesions described above. Nitroglycerin should be administered as an outpatient when necessary. He will be seeing Dr. Katrinka Blazing in followup in approximately one month.   ECHO - May 2013:  EF 55-60%. Mild LVH. No RWMA, mild Aortic Stenosis (mean/peak gradient 13 mmHg/21 mmHg).   Interval History: Dean Harris presents today partly to follow-up for this near syncopal episode, but also to reestablish cardiology care. Since the episode of near syncope that he had a couple days ago, he has not had any further episodes. He also has never had an episode like this before. Last time he had any neurologic type symptoms when he had his amaurosis fugax TIA back in 2004. Interestingly he had the diagnosis of mild aortic stenosis during the evaluation back in 2013, but this was never followed up.  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 He really says it with his routine activity  and amount of walking that he doesn't a standard day-to-day basis, he is not having any exertional angina symptoms. He does have exertional dyspnea which is long-standing and not changed. He does not any resting dyspnea or chest discomfort. He only notes having his "angina" pain if  he really overexerts himself. This may be with lifting something too heavy or walking briskly up a hill or stairs. But for the most part maybe once or twice a month.  Other cardiac review of symptoms as follows:Marland Kitchen.  No PND, orthopnea or edema. No palpitations, lightheadedness, dizziness, weakness or syncope/near syncope. No TIA/amaurosis fugax symptoms. No melena, hematochezia, hematuria, or epstaxis. No claudication.  ROS: A comprehensive was performed. Review of Systems  Constitutional: Negative for fever, chills and diaphoresis.  HENT: Negative for nosebleeds.   Cardiovascular: Negative for claudication.  Gastrointestinal: Negative for heartburn, nausea, vomiting, constipation, blood in stool and melena.  Musculoskeletal: Positive for back pain and joint pain.       Osteoarthritis  Neurological: Negative for weakness and headaches.       None since ER visit  Endo/Heme/Allergies: Does not bruise/bleed easily.  Psychiatric/Behavioral: Negative for depression.  All other systems reviewed and are negative.   Past Medical History  Diagnosis Date  . Pneumonia     x2  . High cholesterol   . Diabetes mellitus   . CAD (coronary artery disease)   . Hypertension   . Vitamin D deficiency   . Angina pectoris [facility](HCC)     Past Surgical History  Procedure Laterality Date  . Carotid endarterectomy    . Left heart catheterization with coronary angiogram N/A 02/13/2012    Procedure: LEFT HEART CATHETERIZATION WITH CORONARY ANGIOGRAM;  Surgeon: Donato SchultzMark Skains, MD;  [facility];  Service: Cardiovascular;  Laterality: N/A;   Prior to Admission medications   Medication Sig Start Date End Date Taking? Authorizing Provider  albuterol-ipratropium (COMBIVENT) 18-103 MCG/ACT inhaler Inhale 1 puff into the lungs every 6 (six) hours as needed. 10/11/15  Yes Mary-Margaret Daphine DeutscherMartin, FNP  aspirin 325 MG tablet Take 325 mg by mouth daily.   Yes Historical Provider, MD  b complex vitamins tablet Take 1  tablet by mouth daily.   Yes Historical Provider, MD  Cholecalciferol (VITAMIN D) 2000 UNITS tablet Take 2,000 Units by mouth daily.    Yes Historical Provider, MD  Fluticasone-Salmeterol (ADVAIR DISKUS) 100-50 MCG/DOSE AEPB Inhale 1 puff into the lungs every 12 (twelve) hours. 10/11/15  Yes Mary-Margaret Daphine DeutscherMartin, FNP  glucose blood (FREESTYLE LITE) test strip Test 1X per day and prn   Dx. 250.02 08/22/13  Yes Mary-Margaret Daphine DeutscherMartin, FNP  isosorbide mononitrate (IMDUR) 30 MG 24 hr tablet Take 1 tablet (30 mg total) by mouth daily. 10/11/15  Yes Mary-Margaret Daphine DeutscherMartin, FNP  lisinopril (PRINIVIL,ZESTRIL) 10 MG tablet Take 1 tablet (10 mg total) by mouth daily. 10/11/15  Yes Mary-Margaret Daphine DeutscherMartin, FNP  metFORMIN (GLUCOPHAGE) 500 MG tablet Take 0.5 tablets (250 mg total) by mouth 2 (two) times daily with a meal. 10/11/15  Yes Mary-Margaret Daphine DeutscherMartin, FNP  simvastatin (ZOCOR) 40 MG tablet Take 1 tablet (40 mg total) by mouth every evening. 10/11/15  Yes Mary-Margaret Daphine DeutscherMartin, FNP  travoprost, benzalkonium, (TRAVATAN) 0.004 % ophthalmic solution 1 drop at bedtime.   Yes Historical Provider, MD   Allergies  Allergen Reactions  . Dust Mite Extract   . Mold Extract [Trichophyton]   . Pollen Extract   . Tetracyclines & Related     Generic only    Social History  Social History  . Marital Status: Married    Spouse Name: N/A  . Number of Children: N/A  . Years of Education: N/A   Social History Main Topics  . Smoking status: Former Smoker    Types: Cigarettes    Quit date: 02/10/1988  . Smokeless tobacco: None  . Alcohol Use: No  . Drug Use: No  . Sexual Activity: Not Asked   Other Topics Concern  . None   Social History Narrative   Family History  Problem Relation Age of Onset  . Diabetes Mother   . Stroke Mother   . Hypertension Mother   . Hyperlipidemia Mother   . Cancer Father   . Hypertension Brother   . Cancer Other      Wt Readings from Last 3 Encounters:  01/21/16 164 lb (74.39 kg)   01/17/16 160 lb (72.576 kg)  01/17/16 162 lb 12.8 oz (73.846 kg)    PHYSICAL EXAM BP 152/82 mmHg  Pulse 80  Ht  (1.651 m)  Wt 164 lb (74.39 kg)  BMI 27.29 kg/m2  No data found.  General appearance: alert, cooperative, appears stated age, no distress and Well-nourished, well-groomed Neck: no adenopathy, no carotid bruit and no JVD - but radiated aortic murmur Lungs: clear to auscultation bilaterally, normal percussion bilaterally and non-labored Heart: RRR, normal S1 and S2. No rubs or gallops. 2/6C-D SEM at RUSB radiating to carotids. Abdomen: soft, non-tender; bowel sounds normal; no masses,  no organomegaly; no HSM Extremities: extremities normal, atraumatic, no cyanosis, or edema  Pulses: 2+ and symmetric;  Skin: mobility and turgor normal or  Neurologic: Mental status: Alert, oriented, thought content appropriate Cranial nerves: normal (II-XII grossly intact)   Adult ECG Report N/a ER EKG showed normal sinus rhythm, heart rate 66 beats minute. Normal EKG.   Other studies Reviewed: Additional studies/ records that were reviewed today include:  Recent Labs:   Lab Results  Component Value Date   CHOL 133 01/17/2016   HDL 37* 01/17/2016   LDLCALC 69 01/17/2016   TRIG 136 01/17/2016   CHOLHDL 3.6 01/17/2016    ASSESSMENT / PLAN: Problem List Items Addressed This Visit    Peripheral neuropathy (HCC)    He says that he is not sure. We'll walk on treadmill because of his neuropathy pains as well as arthritis pains.      Near syncope    I agree with the emergency room physician's assessment this is probably related to hypotension with some vasovagal component in the setting of having blood work drawn. He is on an antihypertension regimen and probably was fasting at the time. The combination of not having hydration and being on ACE inhibitor and nitrate probably led to some is really related to hypotension.  He does have some history of bilateral carotid disease with  carotid endarterectomy. If I think is reasonable to assess for any worsening stenosis that could've led to some true neurologic symptoms.       Relevant Medications   nitroGLYCERIN (NITROSTAT) 0.4 MG SL tablet   Mild aortic stenosis (Chronic)    Mild AS on exam. This will need to be followed up. Annually now until we see any change. He has a standard 2/6 SEM.   For now continue him to follow-up with routine echocardiogram      Relevant Medications   nitroGLYCERIN (NITROSTAT) 0.4 MG SL tablet   Other Relevant Orders   ECHOCARDIOGRAM COMPLETE   VAS US CAROTID   Hyperlipidemia (Chronic)  He remains on simvastatin at 40 mg. Followed by PCP.      Relevant Medications   nitroGLYCERIN (NITROSTAT) 0.4 MG SL tablet   Essential hypertension, benign (Chronic)    Blood pressure is actually low but high today which for me is okay with his recent symptom of dizziness. I would allow for mild permissive hypertension. Continue with the ACE inhibitor and Imdur, will change Imdur to evening time.      Relevant Medications   nitroGLYCERIN (NITROSTAT) 0.4 MG SL tablet   Coronary artery disease, non-occlusive (Chronic)    He only had minimal disease noted by catheterization several years ago. He is having some symptoms which he considers to be angina which with a diabetic may be microvascular. Since he is still having some unclear symptoms or increases his Imdur to 60 mg. Could also consider using Ranexa if this is not helpful. We can continue to reassess these symptoms and if they do get worse with consider another non-invasive evaluation first followed by invasive if necessary. Continue statin. Will consider beta blocker at follow-up visit.      Relevant Medications   nitroGLYCERIN (NITROSTAT) 0.4 MG SL tablet   Chronic stable angina (HCC) - Primary   Relevant Medications   nitroGLYCERIN (NITROSTAT) 0.4 MG SL tablet   Other Relevant Orders   ECHOCARDIOGRAM COMPLETE   VAS US CAROTID    Other  Visit Diagnoses    H/O carotid endarterectomy        Relevant Orders    ECHOCARDIOGRAM COMPLETE    VAS US CAROTID       Current medicines are reviewed at length with the patient today. (+/- concerns): None The following changes have been made: None  Studies Ordered:   Orders Placed This Encounter  Procedures  . ECHOCARDIOGRAM COMPLETE   Follow-up in 6 months   Bryan Lemma, M.D., M.S. Interventional Cardiologist   Pager # [phone] Phone # [phone] [address] [place], [place] [address]

## 2016-01-24 ENCOUNTER — Telehealth: Payer: Self-pay | Admitting: Cardiology

## 2016-01-24 NOTE — Telephone Encounter (Signed)
01-24-16 Lvm to call and schedule echo and cartiod/sf

## 2016-01-28 DIAGNOSIS — I208 Other forms of angina pectoris: Secondary | ICD-10-CM | POA: Insufficient documentation

## 2016-01-28 DIAGNOSIS — I350 Nonrheumatic aortic (valve) stenosis: Secondary | ICD-10-CM | POA: Insufficient documentation

## 2016-01-28 DIAGNOSIS — R55 Syncope and collapse: Secondary | ICD-10-CM | POA: Insufficient documentation

## 2016-01-28 NOTE — Assessment & Plan Note (Signed)
He says that he is not sure. We'll walk on treadmill because of his neuropathy pains as well as arthritis pains.

## 2016-01-28 NOTE — Assessment & Plan Note (Signed)
He remains on simvastatin at 40 mg. Followed by PCP.

## 2016-01-28 NOTE — Assessment & Plan Note (Signed)
I agree with the emergency room physician's assessment this is probably related to hypotension with some vasovagal component in the setting of having blood work drawn. He is on an antihypertension regimen and probably was fasting at the time. The combination of not having hydration and being on ACE inhibitor and nitrate probably led to some is really related to hypotension.  He does have some history of bilateral carotid disease with carotid endarterectomy. If I think is reasonable to assess for any worsening stenosis that could've led to some true neurologic symptoms.

## 2016-01-28 NOTE — Assessment & Plan Note (Signed)
Mild AS on exam. This will need to be followed up. Annually now until we see any change. He has a standard 2/6 SEM.   For now continue him to follow-up with routine echocardiogram

## 2016-01-28 NOTE — Assessment & Plan Note (Signed)
He only had minimal disease noted by catheterization several years ago. He is having some symptoms which he considers to be angina which with a diabetic may be microvascular. Since he is still having some unclear symptoms or increases his Imdur to 60 mg. Could also consider using Ranexa if this is not helpful. We can continue to reassess these symptoms and if they do get worse with consider another non-invasive evaluation first followed by invasive if necessary. Continue statin. Will consider beta blocker at follow-up visit.

## 2016-01-28 NOTE — Assessment & Plan Note (Signed)
Blood pressure is actually low but high today which for me is okay with his recent symptom of dizziness. I would allow for mild permissive hypertension. Continue with the ACE inhibitor and Imdur, will change Imdur to evening time.

## 2016-02-01 ENCOUNTER — Inpatient Hospital Stay (HOSPITAL_COMMUNITY): Admission: RE | Admit: 2016-02-01 | Payer: Medicare Other | Source: Ambulatory Visit

## 2016-02-05 ENCOUNTER — Ambulatory Visit (HOSPITAL_COMMUNITY)
Admission: RE | Admit: 2016-02-05 | Discharge: 2016-02-05 | Disposition: A | Discharge status: Home / Self Care | Payer: Medicare Other | Source: Ambulatory Visit | Attending: Cardiology | Admitting: Cardiology

## 2016-02-05 DIAGNOSIS — I208 Other forms of angina pectoris: Secondary | ICD-10-CM | POA: Diagnosis not present

## 2016-02-05 DIAGNOSIS — Z9889 Other specified postprocedural states: Secondary | ICD-10-CM

## 2016-02-05 DIAGNOSIS — I350 Nonrheumatic aortic (valve) stenosis: Secondary | ICD-10-CM

## 2016-02-05 HISTORY — PX: US CAROTID DOPPLER BILATERAL (ARMC HX): HXRAD1402

## 2016-02-06 ENCOUNTER — Telehealth: Payer: Self-pay | Admitting: *Deleted

## 2016-02-06 DIAGNOSIS — I779 Disorder of arteries and arterioles, unspecified: Secondary | ICD-10-CM

## 2016-02-06 DIAGNOSIS — I6521 Occlusion and stenosis of right carotid artery: Secondary | ICD-10-CM

## 2016-02-06 DIAGNOSIS — I739 Peripheral vascular disease, unspecified: Principal | ICD-10-CM

## 2016-02-06 NOTE — Telephone Encounter (Signed)
-----   Message from Marykay Lexavid W Harding, MD sent at 02/05/2016 11:59 PM EDT ----- Carotid Dopplers 5/23/21017 - stable mild-moderate diseease Heterogeneous plaque, bilaterally: # 1-39% right ICA stenosis. = Right internal carotid artery # 40-59% left ICA stenosis. = Left internal carotid artery # >50% left ECA stenosis. = Left external carotid artery # Normal subclavian arteries, bilaterally. = Arteries going out to the arms # Patent vertebral arteries with antegrade flow. = Arteries going back along the base of the spine into the brain  F/u 1 year  Bryan Lemmaavid Harding, MD

## 2016-02-06 NOTE — Telephone Encounter (Signed)
Spoke to patient and wife . Result given . Verbalized understanding Order placed for 12 month follow up carotid doppler

## 2016-02-07 ENCOUNTER — Other Ambulatory Visit: Payer: Self-pay

## 2016-02-07 ENCOUNTER — Ambulatory Visit (HOSPITAL_COMMUNITY): Payer: Medicare Other | Attending: Cardiovascular Disease

## 2016-02-07 DIAGNOSIS — I10 Essential (primary) hypertension: Secondary | ICD-10-CM | POA: Insufficient documentation

## 2016-02-07 DIAGNOSIS — I350 Nonrheumatic aortic (valve) stenosis: Secondary | ICD-10-CM | POA: Diagnosis not present

## 2016-02-07 DIAGNOSIS — E785 Hyperlipidemia, unspecified: Secondary | ICD-10-CM | POA: Diagnosis not present

## 2016-02-07 DIAGNOSIS — Z87891 Personal history of nicotine dependence: Secondary | ICD-10-CM | POA: Diagnosis not present

## 2016-02-07 DIAGNOSIS — I208 Other forms of angina pectoris: Secondary | ICD-10-CM | POA: Diagnosis not present

## 2016-02-07 DIAGNOSIS — E119 Type 2 diabetes mellitus without complications: Secondary | ICD-10-CM | POA: Insufficient documentation

## 2016-02-07 DIAGNOSIS — Z9889 Other specified postprocedural states: Secondary | ICD-10-CM | POA: Insufficient documentation

## 2016-02-07 DIAGNOSIS — I2510 Atherosclerotic heart disease of native coronary artery without angina pectoris: Secondary | ICD-10-CM | POA: Diagnosis not present

## 2016-02-08 ENCOUNTER — Telehealth: Payer: Self-pay | Admitting: *Deleted

## 2016-02-08 DIAGNOSIS — R931 Abnormal findings on diagnostic imaging of heart and coronary circulation: Secondary | ICD-10-CM

## 2016-02-08 DIAGNOSIS — I350 Nonrheumatic aortic (valve) stenosis: Secondary | ICD-10-CM

## 2016-02-08 NOTE — Telephone Encounter (Signed)
Spoke to patient. Result given . Verbalized understanding Placed order for  Echo test in 12 months

## 2016-02-08 NOTE — Telephone Encounter (Signed)
-----   Message from Marykay Lexavid W Harding, MD sent at 02/08/2016  7:03 AM EDT ----- Overall, the echo looks pretty good. Heart function looks good and the wall motions look good. The aortic valve however has gotten a little bit narrower (moved from mild up to moderate stenosis), but not yet at this stage we'll restart expecting symptoms or need for any treatment beyond treating atherosclerosis.  We will need to check an echocardiogram more frequently now. Probably will check 1 annually.  Bryan Lemmaavid Harding, M.D., M.S.

## 2016-04-08 ENCOUNTER — Telehealth: Payer: Self-pay | Admitting: Cardiology

## 2016-04-08 NOTE — Telephone Encounter (Signed)
Returned call to wife Dean Harris (ok per DPR).  States someone called her from the [facility] office yesterday after 5pm but didn't leave a message, unsure who called.  Offered to schedule follow up appt due in November with MD Harding-OV scheduled for 07/24/2016 at 10am with MD Herbie Baltimore at [place] location.  Wife verbalized understanding.

## 2016-04-08 NOTE — Telephone Encounter (Signed)
New Message   Patient wife stated - returning call from RN. Please call back and discuss. wy

## 2016-04-08 NOTE — Telephone Encounter (Signed)
Attempt to return call-unable to reach pt.

## 2016-04-28 ENCOUNTER — Telehealth: Payer: Self-pay | Admitting: Nurse Practitioner

## 2016-05-02 NOTE — Telephone Encounter (Signed)
Scheduled

## 2016-05-09 ENCOUNTER — Ambulatory Visit (INDEPENDENT_AMBULATORY_CARE_PROVIDER_SITE_OTHER): Payer: Medicare Other | Admitting: Nurse Practitioner

## 2016-05-09 ENCOUNTER — Encounter: Payer: Self-pay | Admitting: Nurse Practitioner

## 2016-05-09 VITALS — BP 144/78 | HR 69 | Temp 96.7°F | Ht 65.0 in | Wt 166.0 lb

## 2016-05-09 DIAGNOSIS — E785 Hyperlipidemia, unspecified: Secondary | ICD-10-CM | POA: Diagnosis not present

## 2016-05-09 DIAGNOSIS — J42 Unspecified chronic bronchitis: Secondary | ICD-10-CM | POA: Diagnosis not present

## 2016-05-09 DIAGNOSIS — D696 Thrombocytopenia, unspecified: Secondary | ICD-10-CM

## 2016-05-09 DIAGNOSIS — G588 Other specified mononeuropathies: Secondary | ICD-10-CM | POA: Diagnosis not present

## 2016-05-09 DIAGNOSIS — Z6830 Body mass index (BMI) 30.0-30.9, adult: Secondary | ICD-10-CM | POA: Diagnosis not present

## 2016-05-09 DIAGNOSIS — I2510 Atherosclerotic heart disease of native coronary artery without angina pectoris: Secondary | ICD-10-CM | POA: Diagnosis not present

## 2016-05-09 DIAGNOSIS — I10 Essential (primary) hypertension: Secondary | ICD-10-CM

## 2016-05-09 DIAGNOSIS — E1142 Type 2 diabetes mellitus with diabetic polyneuropathy: Secondary | ICD-10-CM | POA: Diagnosis not present

## 2016-05-09 LAB — BAYER DCA HB A1C WAIVED: HB A1C (BAYER DCA - WAIVED): 6.3 % (ref <7.0)

## 2016-05-09 MED ORDER — ISOSORBIDE MONONITRATE ER 30 MG PO TB24: 30.0000 mg | ORAL_TABLET | Freq: Every day | ORAL | 5 refills | Status: DC

## 2016-05-09 MED ORDER — METFORMIN HCL 500 MG PO TABS: 250.0000 mg | ORAL_TABLET | Freq: Two times a day (BID) | ORAL | 5 refills | Status: DC

## 2016-05-09 MED ORDER — IPRATROPIUM-ALBUTEROL 18-103 MCG/ACT IN AERO: 1.0000 | INHALATION_SPRAY | Freq: Four times a day (QID) | RESPIRATORY_TRACT | 5 refills | Status: DC | PRN

## 2016-05-09 MED ORDER — SIMVASTATIN 40 MG PO TABS: 40.0000 mg | ORAL_TABLET | Freq: Every evening | ORAL | 5 refills | Status: DC

## 2016-05-09 MED ORDER — LISINOPRIL 10 MG PO TABS: 10.0000 mg | ORAL_TABLET | Freq: Every day | ORAL | 5 refills | Status: DC

## 2016-05-09 MED ORDER — FLUTICASONE-SALMETEROL 100-50 MCG/DOSE IN AEPB: 1.0000 | INHALATION_SPRAY | Freq: Two times a day (BID) | RESPIRATORY_TRACT | 5 refills | Status: DC

## 2016-05-09 NOTE — Patient Instructions (Signed)
Diabetes and Foot Care Diabetes may cause you to have problems because of poor blood supply (circulation) to your feet and legs. This may cause the skin on your feet to become thinner, break easier, and heal more slowly. Your skin may become dry, and the skin may peel and crack. You may also have nerve damage in your legs and feet causing decreased feeling in them. You may not notice minor injuries to your feet that could lead to infections or more serious problems. Taking care of your feet is one of the most important things you can do for yourself.  HOME CARE INSTRUCTIONS  Wear shoes at all times, even in the house. Do not go barefoot. Bare feet are easily injured.  Check your feet daily for blisters, cuts, and redness. If you cannot see the bottom of your feet, use a mirror or ask someone for help.  Wash your feet with warm water (do not use hot water) and mild soap. Then pat your feet and the areas between your toes until they are completely dry. Do not soak your feet as this can dry your skin.  Apply a moisturizing lotion or petroleum jelly (that does not contain alcohol and is unscented) to the skin on your feet and to dry, brittle toenails. Do not apply lotion between your toes.  Trim your toenails straight across. Do not dig under them or around the cuticle. File the edges of your nails with an emery board or nail file.  Do not cut corns or calluses or try to remove them with medicine.  Wear clean socks or stockings every day. Make sure they are not too tight. Do not wear knee-high stockings since they may decrease blood flow to your legs.  Wear shoes that fit properly and have enough cushioning. To break in new shoes, wear them for just a few hours a day. This prevents you from injuring your feet. Always look in your shoes before you put them on to be sure there are no objects inside.  Do not cross your legs. This may decrease the blood flow to your feet.  If you find a minor scrape,  cut, or break in the skin on your feet, keep it and the skin around it clean and dry. These areas may be cleansed with mild soap and water. Do not cleanse the area with peroxide, alcohol, or iodine.  When you remove an adhesive bandage, be sure not to damage the skin around it.  If you have a wound, look at it several times a day to make sure it is healing.  Do not use heating pads or hot water bottles. They may burn your skin. If you have lost feeling in your feet or legs, you may not know it is happening until it is too late.  Make sure your health care provider performs a complete foot exam at least annually or more often if you have foot problems. Report any cuts, sores, or bruises to your health care provider immediately. SEEK MEDICAL CARE IF:   You have an injury that is not healing.  You have cuts or breaks in the skin.  You have an ingrown nail.  You notice redness on your legs or feet.  You feel burning or tingling in your legs or feet.  You have pain or cramps in your legs and feet.  Your legs or feet are numb.  Your feet always feel cold. SEEK IMMEDIATE MEDICAL CARE IF:   There is increasing redness,   swelling, or pain in or around a wound.  There is a red line that goes up your leg.  Pus is coming from a wound.  You develop a fever or as directed by your health care provider.  You notice a bad smell coming from an ulcer or wound.   This information is not intended to replace advice given to you by your health care provider. Make sure you discuss any questions you have with your health care provider.   Document Released: 08/29/2000 Document Revised: 05/04/2013 Document Reviewed: 02/08/2013 Elsevier Interactive Patient Education 2016 [employer]  

## 2016-05-09 NOTE — Progress Notes (Signed)
Subjective:    Patient ID: Dean Harris, male    DOB: [birth year], 75 y.o.   MRN: [id number]   Patient here today for follow up of chronic medical problems.  Outpatient Encounter Prescriptions as of 05/09/2016  Medication Sig  . albuterol-ipratropium (COMBIVENT) 18-103 MCG/ACT inhaler Inhale 1 puff into the lungs every 6 (six) hours as needed.  Marland Kitchen aspirin 325 MG tablet Take 325 mg by mouth daily.  Marland Kitchen b complex vitamins tablet Take 1 tablet by mouth daily.  . Cholecalciferol (VITAMIN D) 2000 UNITS tablet Take 2,000 Units by mouth daily.   . Fluticasone-Salmeterol (ADVAIR DISKUS) 100-50 MCG/DOSE AEPB Inhale 1 puff into the lungs every 12 (twelve) hours.  Marland Kitchen glucose blood (FREESTYLE LITE) test strip Test 1X per day and prn   Dx. 250.02  . isosorbide mononitrate (IMDUR) 30 MG 24 hr tablet Take 1 tablet (30 mg total) by mouth daily.  Marland Kitchen lisinopril (PRINIVIL,ZESTRIL) 10 MG tablet Take 1 tablet (10 mg total) by mouth daily.  . metFORMIN (GLUCOPHAGE) 500 MG tablet Take 0.5 tablets (250 mg total) by mouth 2 (two) times daily with a meal.  . nitroGLYCERIN (NITROSTAT) 0.4 MG SL tablet Place 63 tablets (25 mg total) under the tongue every 5 (five) minutes as needed for chest pain.  . simvastatin (ZOCOR) 40 MG tablet Take 1 tablet (40 mg total) by mouth every evening.  . travoprost, benzalkonium, (TRAVATAN) 0.004 % ophthalmic solution 1 drop at bedtime.   No facility-administered encounter medications on file as of 05/09/2016.    * last time patient was here he had a syncopial episode in office and had to be sent to ER via ambulance. ER diagnosed him with dehydraton and low blood sugar- they gave him IV fluids and improved- no more episodes since then.  Hyperlipidemia  This is a chronic problem. The current episode started more than 1 year ago. The problem is controlled. Recent lipid tests were reviewed and are normal. Pertinent negatives include no chest pain or shortness of breath. Current  antihyperlipidemic treatment includes statins. Risk factors for coronary artery disease include male sex, hypertension, dyslipidemia and diabetes mellitus.  Diabetes  He presents for his follow-up diabetic visit. He has type 2 diabetes mellitus. No MedicAlert identification noted. His disease course has been stable. Pertinent negatives for hypoglycemia include no confusion, dizziness or headaches. Pertinent negatives for diabetes include no chest pain, no fatigue, no polydipsia, no polyphagia, no polyuria and no visual change. Symptoms are stable. Risk factors for coronary artery disease include male sex, post-menopausal, dyslipidemia and diabetes mellitus. Current diabetic treatment includes oral agent (monotherapy). He is compliant with treatment all of the time. He sees a podiatrist.Eye exam is current.  Hypertension  This is a chronic problem. The current episode started more than 1 year ago. The problem is unchanged. The problem is controlled. Pertinent negatives include no chest pain, headaches, palpitations or shortness of breath. Risk factors for coronary artery disease include dyslipidemia, male gender and diabetes mellitus. Past treatments include diuretics. There are no compliance problems.   CAD Sees cardiologist yearly. Currently on imdur. COPD combivent daily and advair- no recent flare ups- denies SOB Hx thrombocytopenia Has not had labs checked in awhile- no bruising  Neuropathy peripheral Numbness in feet most of time- mainly in toes- burning sensation on heels bil   Review of Systems  Constitutional: Negative.  Negative for fatigue.  HENT: Negative.   Respiratory: Negative.  Negative for shortness of breath.   Cardiovascular: Negative.  Negative for chest pain and palpitations.  Endocrine: Negative for polydipsia, polyphagia and polyuria.  Genitourinary: Negative.   Neurological: Negative for dizziness and headaches.  Psychiatric/Behavioral: Negative for confusion.         Objective:   Physical Exam  Constitutional: He is oriented to person, place, and time. He appears well-developed and well-nourished.  HENT:  Head: Normocephalic.  Eyes: Conjunctivae are normal. Pupils are equal, round, and reactive to light.  Cardiovascular: Normal rate and regular rhythm.   Pulmonary/Chest: Effort normal and breath sounds normal. No respiratory distress. He has no wheezes. He has no rales. He exhibits no tenderness.  Genitourinary:  Genitourinary Comments: No CVAtenderness  Musculoskeletal: Normal range of motion.  Neurological: He is alert and oriented to person, place, and time. He has normal reflexes.  Skin: Skin is warm.  Erythematous annular lesion on right upper inner thigh  Psychiatric: He has a normal mood and affect. His behavior is normal. Judgment and thought content normal.   BP (!) 144/78 (Cuff Size: Normal)   Pulse 69   Temp (!) 96.7 F (35.9 C) (Oral)   Ht '5\' 5"'  (1.651 m)   Wt 166 lb (75.3 kg)   BMI 27.62 kg/m   hgba1c 6.3% down from 6.5% at last visit    Assessment & Plan:   1. Type 2 diabetes mellitus with diabetic polyneuropathy, without long-term current use of insulin (HCC) Continue to watch carbs in diet - Bayer DCA Hb A1c Waived - Microalbumin / creatinine urine ratio - metFORMIN (GLUCOPHAGE) 500 MG tablet; Take 0.5 tablets (250 mg total) by mouth 2 (two) times daily with a meal.  Dispense: 60 tablet; Refill: 5  2. Hyperlipidemia Low fat diet - Lipid panel - simvastatin (ZOCOR) 40 MG tablet; Take 1 tablet (40 mg total) by mouth every evening.  Dispense: 30 tablet; Refill: 5  3. Essential hypertension, benign Do not add salt to diet - CMP14+EGFR - lisinopril (PRINIVIL,ZESTRIL) 10 MG tablet; Take 1 tablet (10 mg total) by mouth daily.  Dispense: 30 tablet; Refill: 5  4. Coronary artery disease, non-occlusive Keep follow up with cardiology - isosorbide mononitrate (IMDUR) 30 MG 24 hr tablet; Take 1 tablet (30 mg total) by mouth  daily.  Dispense: 30 tablet; Refill: 5  5. Other mononeuropathy  6. BMI 30.0-30.9,adult Discussed diet and exercise for person with BMI >25 Will recheck weight in 3-6 months  7. Thrombocytopenia ([place]) Labs pending  8. Chronic bronchitis, unspecified chronic bronchitis type (HCC) - albuterol-ipratropium (COMBIVENT) 18-103 MCG/ACT inhaler; Inhale 1 puff into the lungs every 6 (six) hours as needed.  Dispense: 1 Inhaler; Refill: 5 - Fluticasone-Salmeterol (ADVAIR DISKUS) 100-50 MCG/DOSE AEPB; Inhale 1 puff into the lungs every 12 (twelve) hours.  Dispense: 60 each; Refill: 5     Labs pending Health maintenance reviewed Diet and exercise encouraged Continue all meds Follow up  In 3 months   [place], FNP

## 2016-05-10 LAB — LIPID PANEL
CHOL/HDL RATIO: 2.6 ratio (ref 0.0–5.0)
Cholesterol, Total: 120 mg/dL (ref 100–199)
HDL: 46 mg/dL (ref >39)
LDL CALC: 59 mg/dL (ref 0–99)
TRIGLYCERIDES: 73 mg/dL (ref 0–149)
VLDL CHOLESTEROL CAL: 15 mg/dL (ref 5–40)

## 2016-05-10 LAB — CMP14+EGFR
ALBUMIN: 4.5 g/dL (ref 3.5–4.8)
ALT: 18 IU/L (ref 0–44)
AST: 17 IU/L (ref 0–40)
Albumin/Globulin Ratio: 2 (ref 1.2–2.2)
Alkaline Phosphatase: 52 IU/L (ref 39–117)
BUN/Creatinine Ratio: 18 (ref 10–24)
BUN: 19 mg/dL (ref 8–27)
Bilirubin Total: 1.4 mg/dL — ABNORMAL HIGH (ref 0.0–1.2)
CALCIUM: 9.2 mg/dL (ref 8.6–10.2)
CO2: 24 mmol/L (ref 18–29)
CREATININE: 1.03 mg/dL (ref 0.76–1.27)
Chloride: 99 mmol/L (ref 96–106)
GFR calc Af Amer: 84 mL/min/{1.73_m2} (ref >59)
GFR, EST NON AFRICAN AMERICAN: 72 mL/min/{1.73_m2} (ref >59)
GLOBULIN, TOTAL: 2.2 g/dL (ref 1.5–4.5)
GLUCOSE: 109 mg/dL — AB (ref 65–99)
Potassium: 4.4 mmol/L (ref 3.5–5.2)
SODIUM: 139 mmol/L (ref 134–144)
Total Protein: 6.7 g/dL (ref 6.0–8.5)

## 2016-05-10 LAB — MICROALBUMIN / CREATININE URINE RATIO
Creatinine, Urine: 44.6 mg/dL
Microalbumin, Urine: <3 ug/mL

## 2016-07-22 ENCOUNTER — Encounter: Payer: Self-pay | Admitting: Cardiology

## 2016-07-24 ENCOUNTER — Ambulatory Visit (INDEPENDENT_AMBULATORY_CARE_PROVIDER_SITE_OTHER): Payer: Medicare Other | Admitting: Cardiology

## 2016-07-24 ENCOUNTER — Encounter: Payer: Self-pay | Admitting: Cardiology

## 2016-07-24 VITALS — BP 132/60 | HR 77 | Ht 65.0 in | Wt 168.0 lb

## 2016-07-24 DIAGNOSIS — I208 Other forms of angina pectoris: Secondary | ICD-10-CM

## 2016-07-24 DIAGNOSIS — I2510 Atherosclerotic heart disease of native coronary artery without angina pectoris: Secondary | ICD-10-CM | POA: Diagnosis not present

## 2016-07-24 DIAGNOSIS — Z6830 Body mass index (BMI) 30.0-30.9, adult: Secondary | ICD-10-CM

## 2016-07-24 DIAGNOSIS — I350 Nonrheumatic aortic (valve) stenosis: Secondary | ICD-10-CM | POA: Diagnosis not present

## 2016-07-24 DIAGNOSIS — I2089 Other forms of angina pectoris: Secondary | ICD-10-CM

## 2016-07-24 DIAGNOSIS — E785 Hyperlipidemia, unspecified: Secondary | ICD-10-CM

## 2016-07-24 DIAGNOSIS — I10 Essential (primary) hypertension: Secondary | ICD-10-CM

## 2016-07-24 DIAGNOSIS — I6523 Occlusion and stenosis of bilateral carotid arteries: Secondary | ICD-10-CM | POA: Insufficient documentation

## 2016-07-24 DIAGNOSIS — E1169 Type 2 diabetes mellitus with other specified complication: Secondary | ICD-10-CM

## 2016-07-24 DIAGNOSIS — R413 Other amnesia: Secondary | ICD-10-CM

## 2016-07-24 MED ORDER — SIMVASTATIN 40 MG PO TABS: 40.0000 mg | ORAL_TABLET | Freq: Every evening | ORAL | 5 refills | Status: DC

## 2016-07-24 NOTE — Assessment & Plan Note (Addendum)
He does have a good murmur on exam there is at least 3+. This correlates with echocardiogram findings of moderate disease. Not unexpectedly he had progression since 2013. We will check an echocardiogram next year to see if his been any further progression. I find it hard to believe that with hypertension and moderate aortic stenosis that is diastolic function is normal.  Plan: Recheck echocardiogram May 2018 prior to follow-up

## 2016-07-24 NOTE — Patient Instructions (Addendum)
[address]-- MAY 2018 Your physician has requested that you have a carotid duplex. This test is an ultrasound of the carotid arteries in your neck. It looks at blood flow through these arteries that supply the brain with blood. Allow one hour for this exam. There are no restrictions or special instructions.  SCHEDULE AT [address] [phone] Your physician has requested that you have an echocardiogram. Echocardiography is a painless test that uses sound waves to create images of your heart. It provides your doctor with information about the size and shape of your heart and how well your heart's chambers and valves are working. This procedure takes approximately one hour. There are no restrictions for this procedure.   STOP TAKING SIMVASTATIN FOR NEXT 6 WEEKS, CALL THE OFFICE TO LET US KNOW HOW YOUR MEMORY IS DOING AFTER BEING OFF THE MEDICATIONS.   Your physician wants you to follow-up in: MAY 2018 WITH DR HARDING AFTER TESTS ARE COMPLETED- 30 MIN. You will receive a reminder letter in the mail two months in advance. If you don't receive a letter, please call our office to schedule the follow-up appointment.   If you need a refill on your cardiac medications before your next appointment, please call your pharmacy.

## 2016-07-24 NOTE — Assessment & Plan Note (Addendum)
Stable carotid disease. Will follow-up annually - next check due in May 2018, prior to follow-up

## 2016-07-24 NOTE — Assessment & Plan Note (Signed)
Pretty well-controlled on ACE inhibitor.

## 2016-07-24 NOTE — Assessment & Plan Note (Signed)
Hartel with this is really related to him. Because he is on simvastatin, cannot exclude the correlation between statin use and memory issues. Plan for 6-8 week statin holiday. He will be contacted to return if he has any improvement of his symptoms. If he does, will simply changed to a different statin. No change in symptoms, I would still probably change with different statin given the increased correlation between memory loss and simvastatin over other statins.

## 2016-07-24 NOTE — Assessment & Plan Note (Signed)
Pretty well-controlled on simvastatin. As noted in memory loss section, we are going to do a statin holiday for 6-8 weeks and then restart a different statin depending on what his symptom changes. Regardless I will probably change from simvastatin to a different statin.

## 2016-07-24 NOTE — Progress Notes (Signed)
PCP: Bennie Pierini, FNP  Clinic Note: Chief Complaint  Patient presents with  . Follow-up    6 MONTHS    HPI: Dean Harris is a 75 y.o. male with a PMH below who presents today for six-month follow-up for his CAD with chronic stable angina. He also has chronic bilateral carotid disease - status post Bilateral carotid endarterectomy after an episode of amaurosis fugax 2004. Prior to my seeing him, he been evaluated by Dr. Katrinka Blazing for chest pain in May 2013. His cardiac catheterization showed diffuse 50% disease in the mid LAD as well as mid RCA and proximal OM 1. Normal EF with moderately elevated LVEDP of . He has a chronic pulmonary condition is been going on since childhood that has limited his ability to exercise.  FLORENCE ANTONELLI was last seen on 01/21/2016 as part of an ER follow-up for syncopal episode. He had a significant drop in his blood pressure of 70s to respond normal saline. This is thought to be vasovagal in the setting of fasting and having bloodwork drawn.  Recent Hospitalizations: none  Studies Reviewed:   Echo & Carotid Dopplers in Feb 07, 2016 (see PSH)   Interval History: Dean Harris presents today here for follow-up doing fairly well. He is still having issues with memory loss that seems to have stabilized little bit. From a cardiac standpoint he really only notes chest tightness or pressure if the does heavy exertion such as digging holes or doing heavy lifting. He really doesn't notice it with routine activity. He really is limited as far as any exercise/walking by hip pain from arthritis. With routine activity does not have any chest tightness or pressure or even dyspnea.  No PND, orthopnea or edema.  No palpitations, lightheadedness, dizziness, weakness & NO RECURRENT syncope/near syncope -- the episode in the summer was really associated with him not having anything to eat or drink during the course the day and being out in the heat.. No TIA/amaurosis  fugax symptoms. No melena, hematochezia, hematuria, or epstaxis. No claudication.  ROS: A comprehensive was performed. Review of Systems  Constitutional: Negative for chills, fever, malaise/fatigue and weight loss.  HENT: Negative for congestion and nosebleeds.   Respiratory: Positive for shortness of breath (chronic lungbreathing issues -- cannot run or exert himself). Negative for cough and wheezing.        Breathing issue has been lifelong - since childhood. Uses inhalers  Cardiovascular: Positive for chest pain (with significant exertion- heavy lifting, digging hole). Negative for palpitations, orthopnea, claudication (sometimes legs hurt if he walks a long way.  ), leg swelling and PND.  Gastrointestinal: Negative for blood in stool and melena.  Genitourinary: Negative for hematuria.  Musculoskeletal: Positive for joint pain (Hip pain).       Hips & knees limit his walking  Neurological: Negative for dizziness.  Endo/Heme/Allergies: Negative for environmental allergies.  Psychiatric/Behavioral: Positive for memory loss.    Past Medical History:  Diagnosis Date  . Chronic stable angina (HCC)   . 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 Diabetes mellitus   . Essential hypertension   . Hyperlipidemia with target LDL less than 70   . 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 Pneumonia    x2  . 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 This has helped his memory issues.  . Vitamin D deficiency     Past Surgical History:  Procedure Laterality Date  . CAROTID ENDARTERECTOMY    . LEFT HEART CATHETERIZATION WITH CORONARY ANGIOGRAM N/A 02/13/2012   Procedure: LEFT HEART CATHETERIZATION WITH CORONARY ANGIOGRAM;  Surgeon: Donato SchultzMark Skains, MD;   [facility];  Service: Cardiovascular: Diffuse mid LAD 50%, 50%pOM1, 50%mRCA -> medical management  . TRANSTHORACIC ECHOCARDIOGRAM  01/2012   EF 55-60%. Mild LVH. No RWMA, mild Aortic Stenosis (mean/peak gradient 13 mmHg/21 mmHg)  . TRANSTHORACIC ECHOCARDIOGRAM  02/07/2016   EF 60-65% with normal wall motion. Normal diastolic function for age. Moderate aortic stenosis (mean/peak gradient 24/35 mmHg)  . US CAROTID DOPPLER BILATERAL ([facility] HX) Bilateral 02/05/2016   Stable mild-to-moderate disease with bilateral heterogeneous plaque. RICA - 1-39%, LICA 40-59%. Bilateral subclavian and vertebral arteries normal.    Prior to Admission medications   Medication Sig Start Date End Date Taking? Authorizing Provider  albuterol-ipratropium (COMBIVENT) 18-103 MCG/ACT inhaler Inhale 1 puff into the lungs every 6 (six) hours as needed. 05/09/16  Yes Mary-Margaret Daphine DeutscherMartin, FNP  aspirin 325 MG tablet Take 325 mg by mouth daily.   Yes Historical Provider, MD  b complex vitamins tablet Take 1 tablet by mouth daily.   Yes Historical Provider, MD  Cholecalciferol (VITAMIN D) 2000 UNITS tablet Take 2,000 Units by mouth daily.    Yes Historical Provider, MD  Fluticasone-Salmeterol (ADVAIR DISKUS) 100-50 MCG/DOSE AEPB Inhale 1 puff into the lungs every 12 (twelve) hours. 05/09/16  Yes Mary-Margaret Daphine DeutscherMartin, FNP  glucose blood (FREESTYLE LITE) test strip Test 1X per day and prn   Dx. 250.02 08/22/13  Yes Mary-Margaret Daphine DeutscherMartin, FNP  isosorbide mononitrate (IMDUR) 30 MG 24 hr tablet Take 1 tablet (30 mg total) by mouth daily. 05/09/16  Yes Mary-Margaret Daphine DeutscherMartin, FNP  lisinopril (PRINIVIL,ZESTRIL) 10 MG tablet Take 1 tablet (10 mg total) by mouth daily. 05/09/16  Yes Mary-Margaret Daphine DeutscherMartin, FNP  metFORMIN (GLUCOPHAGE) 500 MG tablet Take 0.5 tablets (250 mg total) by mouth 2 (two) times daily with a meal. 05/09/16  Yes Mary-Margaret Daphine DeutscherMartin, FNP  nitroGLYCERIN (NITROSTAT) 0.4 MG SL tablet Place 63 tablets (25 mg total) under  the tongue every 5 (five) minutes as needed for chest pain. 01/21/16  Yes Marykay Lexavid W Harding, MD  simvastatin (ZOCOR) 40 MG tablet Take 1 tablet (40 mg total) by mouth every evening. 05/09/16  Yes Mary-Margaret Daphine DeutscherMartin, FNP  travoprost, benzalkonium, (TRAVATAN) 0.004 % ophthalmic solution 1 drop at bedtime.   Yes Historical Provider, MD   Takes a Med (from [facility]) -- for Depression / PTSD   Allergies  Allergen Reactions  . Dust Mite Extract   . Mold Extract [Trichophyton]   . Pollen Extract   . Tetracyclines & Related     Generic only    Social History   Social History  . Marital status: Married    Spouse name: N/A  . Number of children: N/A  . Years of education: N/A   Social History Main Topics  . Smoking status: Former Smoker    Types: Cigarettes    Quit date: 02/10/1988  . Smokeless tobacco: Never Used  . Alcohol use No  . Drug use: No  . Sexual activity: Not Asked   Other Topics Concern  . None   Social History Narrative  . None   Family History  Problem Relation Age of Onset  . Diabetes Mother   . Stroke Mother   .  Hypertension Mother   . Hyperlipidemia Mother   . Cancer Father   . Hypertension Brother   . Cancer Other     Wt Readings from Last 3 Encounters:  07/24/16 76.2 kg (168 lb)  05/09/16 75.3 kg (166 lb)  01/21/16 74.4 kg (164 lb)    PHYSICAL EXAM BP 132/60   Pulse 77   Ht 5\' 5"  (1.651 m)   Wt 76.2 kg (168 lb)   BMI 27.96 kg/m  General appearance: alert, cooperative, appears stated age, no distress and Well-nourished, well-groomed Neck: no adenopathy, no carotid bruit and no JVD - but radiated aortic murmur Lungs: clear to auscultation bilaterally, normal percussion bilaterally and non-labored Heart: RRR, normal S1 and S2. No rubs or gallops. Harsh 3/6C-D SEM (and throughout the precordium) at RUSB radiating to carotids. Abdomen: soft, non-tender; bowel sounds normal; no masses,  no organomegaly; no HSM Extremities: extremities normal, atraumatic,  no cyanosis, or edema  Pulses: 2+ and symmetric;  Skin: mobility and turgor normal or  Neurologic: Mental status: Alert, oriented, thought content appropriate Cranial nerves: normal (II-XII grossly intact)   Adult ECG Report  Rate: 77 ;  Rhythm: normal sinus rhythm; normal axis, intervals and durations  Narrative Interpretation: Stable, normal EKG   Other studies Reviewed: Additional studies/ records that were reviewed today include:  Recent Labs:   Lab Results  Component Value Date   CHOL 120 05/09/2016   HDL 46 05/09/2016   LDLCALC 59 05/09/2016   TRIG 73 05/09/2016   CHOLHDL 2.6 05/09/2016     ASSESSMENT / PLAN: Problem List Items Addressed This Visit    Moderate aortic stenosis (Chronic)    He does have a good murmur on exam there is at least 3+. This correlates with echocardiogram findings of moderate disease. Not unexpectedly he had progression since 2013. We will check an echocardiogram next year to see if his been any further progression. I find it hard to believe that with hypertension and moderate aortic stenosis that is diastolic function is normal.  Plan: Recheck echocardiogram May 2018 prior to follow-up      Relevant Medications   simvastatin (ZOCOR) 40 MG tablet   Other Relevant Orders   EKG 12-Lead   ECHOCARDIOGRAM COMPLETE   VAS US CAROTID   Intermittent memory loss (Chronic)    Dean Harris with this is really related to him. Because he is on simvastatin, cannot exclude the correlation between statin use and memory issues. Plan for 6-8 week statin holiday. He will be contacted to return if he has any improvement of his symptoms. If he does, will simply changed to a different statin. No change in symptoms, I would still probably change with different statin given the increased correlation between memory loss and simvastatin over other statins.      Relevant Orders   ECHOCARDIOGRAM COMPLETE   VAS US CAROTID   Hyperlipidemia associated with type 2 diabetes  mellitus (HCC) (Chronic)    Pretty well-controlled on simvastatin. As noted in memory loss section, we are going to do a statin holiday for 6-8 weeks and then restart a different statin depending on what his symptom changes. Regardless I will probably change from simvastatin to a different statin.      Relevant Medications   simvastatin (ZOCOR) 40 MG tablet   Carotid artery plaque, bilateral; s/p Bilateral CEA (Chronic)    Stable carotid disease. Will follow-up annually - next check due in May 2018, prior to follow-up      Relevant Medications  simvastatin (ZOCOR) 40 MG tablet   Other Relevant Orders   ECHOCARDIOGRAM COMPLETE   VAS US CAROTID   Coronary artery disease, non-occlusive (Chronic)    Moderate disease by cath several years ago. He does have diabetes, hyperlipidemia and hypertension as risk factors. It is possible that he has had progression of disease. Currently doing okay on Imdur, and not having increased to 60 mg since I last saw him - in fact he has not really had to use nitroglycerin since I saw him.. As long as he is stable, we'll hold off on beta blocker given his long-term estrogen issues. Could consider calcium channel blocker for antianginal benefit. Continue aspirin and statin Tight glycemic control.      Relevant Medications   simvastatin (ZOCOR) 40 MG tablet   Other Relevant Orders   EKG 12-Lead   ECHOCARDIOGRAM COMPLETE   VAS US CAROTID   Essential hypertension, benign (Chronic)    Pretty well-controlled on ACE inhibitor.      Relevant Medications   simvastatin (ZOCOR) 40 MG tablet   Other Relevant Orders   EKG 12-Lead   ECHOCARDIOGRAM COMPLETE   VAS US CAROTID   Chronic stable angina (HCC) - Primary (Chronic)    Probably microvascular in nature. He did not have any macrovascular disease was instructed on cardiac catheterization. Overall pretty well-controlled with Imdur. He is not on beta blocker because of his pulmonary history issues. On statin  and aspirin. On ACE inhibitor for afterload reduction for possible diastolic dysfunction mediated chest pain.  As long as his symptoms do not progress or happening with less exertion, I think finding just treating medically and not going forward with further testing      Relevant Medications   simvastatin (ZOCOR) 40 MG tablet   Other Relevant Orders   EKG 12-Lead   ECHOCARDIOGRAM COMPLETE   VAS US CAROTID   BMI 30.0-30.9,adult   Relevant Orders   ECHOCARDIOGRAM COMPLETE   VAS US CAROTID      Current medicines are reviewed at length with the patient today. (+/- concerns) Concerns about memory. Asked about potential medicine patient she The following changes have been made: Statin Holiday x 6 weeks   Patient Instructions  Schedule at [address] [facility]-- MAY 2018 Your physician has requested that you have a carotid duplex. This test is an ultrasound of the carotid arteries in your neck. It looks at blood flow through these arteries that supply the brain with blood. Allow one hour for this exam. There are no restrictions or special instructions.  SCHEDULE AT [address] [phone] Your physician has requested that you have an echocardiogram. Echocardiography is a painless test that uses sound waves to create images of your heart. It provides your doctor with information about the size and shape of your heart and how well your heart's chambers and valves are working. This procedure takes approximately one hour. There are no restrictions for this procedure.   STOP TAKING SIMVASTATIN FOR NEXT 6 WEEKS, CALL THE OFFICE TO LET us KNOW HOW YOUR MEMORY IS DOING AFTER BEING OFF THE MEDICATIONS.   Your physician wants you to follow-up in: MAY 2018 WITH DR HARDING AFTER TESTS ARE COMPLETED- 30 MIN. You will receive a reminder letter in the mail two months in advance. If you don't receive a letter, please call our office to schedule the follow-up appointment.   If you  need a refill on your cardiac medications before your next appointment, please call your pharmacy.  Studies Ordered:   Orders Placed This Encounter  Procedures  . EKG 12-Lead  . ECHOCARDIOGRAM COMPLETE      Bryan Lemma, M.D., M.S. Interventional Cardiologist   Pager # [phone] Phone # [phone] [address] [place], [place] [address]

## 2016-07-24 NOTE — Assessment & Plan Note (Addendum)
Moderate disease by cath several years ago. He does have diabetes, hyperlipidemia and hypertension as risk factors. It is possible that he has had progression of disease. Currently doing okay on Imdur, and not having increased to 60 mg since I last saw him - in fact he has not really had to use nitroglycerin since I saw him.. As long as he is stable, we'll hold off on beta blocker given his long-term estrogen issues. Could consider calcium channel blocker for antianginal benefit. Continue aspirin and statin Tight glycemic control.

## 2016-07-24 NOTE — Assessment & Plan Note (Signed)
Probably microvascular in nature. He did not have any macrovascular disease was instructed on cardiac catheterization. Overall pretty well-controlled with Imdur. He is not on beta blocker because of his pulmonary history issues. On statin and aspirin. On ACE inhibitor for afterload reduction for possible diastolic dysfunction mediated chest pain.  As long as his symptoms do not progress or happening with less exertion, I think finding just treating medically and not going forward with further testing

## 2016-08-11 ENCOUNTER — Ambulatory Visit (INDEPENDENT_AMBULATORY_CARE_PROVIDER_SITE_OTHER): Payer: Medicare Other | Admitting: Nurse Practitioner

## 2016-08-11 ENCOUNTER — Encounter: Payer: Self-pay | Admitting: Nurse Practitioner

## 2016-08-11 VITALS — BP 128/82 | HR 71 | Temp 96.9°F | Ht 65.0 in | Wt 165.0 lb

## 2016-08-11 DIAGNOSIS — E1142 Type 2 diabetes mellitus with diabetic polyneuropathy: Secondary | ICD-10-CM

## 2016-08-11 DIAGNOSIS — R413 Other amnesia: Secondary | ICD-10-CM

## 2016-08-11 DIAGNOSIS — I10 Essential (primary) hypertension: Secondary | ICD-10-CM

## 2016-08-11 DIAGNOSIS — E1169 Type 2 diabetes mellitus with other specified complication: Secondary | ICD-10-CM

## 2016-08-11 DIAGNOSIS — Z6830 Body mass index (BMI) 30.0-30.9, adult: Secondary | ICD-10-CM

## 2016-08-11 DIAGNOSIS — E785 Hyperlipidemia, unspecified: Secondary | ICD-10-CM | POA: Diagnosis not present

## 2016-08-11 DIAGNOSIS — I2510 Atherosclerotic heart disease of native coronary artery without angina pectoris: Secondary | ICD-10-CM

## 2016-08-11 DIAGNOSIS — Z1212 Encounter for screening for malignant neoplasm of rectum: Secondary | ICD-10-CM

## 2016-08-11 DIAGNOSIS — I208 Other forms of angina pectoris: Secondary | ICD-10-CM | POA: Diagnosis not present

## 2016-08-11 DIAGNOSIS — G588 Other specified mononeuropathies: Secondary | ICD-10-CM | POA: Diagnosis not present

## 2016-08-11 DIAGNOSIS — Z1211 Encounter for screening for malignant neoplasm of colon: Secondary | ICD-10-CM

## 2016-08-11 DIAGNOSIS — J42 Unspecified chronic bronchitis: Secondary | ICD-10-CM

## 2016-08-11 LAB — BAYER DCA HB A1C WAIVED: HB A1C: 6.1 % (ref <7.0)

## 2016-08-11 NOTE — Progress Notes (Signed)
Subjective:    Patient ID: Dean Harris, male    DOB: 1940-10-19, 75 y.o.   MRN: [id number]   Patient here today for follow up of chronic medical problems. No changes since last visit- no complaints today  Outpatient Encounter Prescriptions as of 08/11/2016  Medication Sig  . albuterol-ipratropium (COMBIVENT) 18-103 MCG/ACT inhaler Inhale 1 puff into the lungs every 6 (six) hours as needed.  Marland Kitchen aspirin 325 MG tablet Take 325 mg by mouth daily.  Marland Kitchen b complex vitamins tablet Take 1 tablet by mouth daily.  . Cholecalciferol (VITAMIN D) 2000 UNITS tablet Take 2,000 Units by mouth daily.   . Fluticasone-Salmeterol (ADVAIR DISKUS) 100-50 MCG/DOSE AEPB Inhale 1 puff into the lungs every 12 (twelve) hours.  Marland Kitchen glucose blood (FREESTYLE LITE) test strip Test 1X per day and prn   Dx. 250.02  . isosorbide mononitrate (IMDUR) 30 MG 24 hr tablet Take 1 tablet (30 mg total) by mouth daily.  Marland Kitchen lisinopril (PRINIVIL,ZESTRIL) 10 MG tablet Take 1 tablet (10 mg total) by mouth daily.  . metFORMIN (GLUCOPHAGE) 500 MG tablet Take 0.5 tablets (250 mg total) by mouth 2 (two) times daily with a meal.  . nitroGLYCERIN (NITROSTAT) 0.4 MG SL tablet Place 63 tablets (25 mg total) under the tongue every 5 (five) minutes as needed for chest pain.  . simvastatin (ZOCOR) 40 MG tablet Take 1 tablet (40 mg total) by mouth every evening.  . travoprost, benzalkonium, (TRAVATAN) 0.004 % ophthalmic solution 1 drop at bedtime.   No facility-administered encounter medications on file as of 08/11/2016.    Hyperlipidemia  This is a chronic problem. The current episode started more than 1 year ago. The problem is controlled. Recent lipid tests were reviewed and are normal. Pertinent negatives include no chest pain or shortness of breath. Treatments tried: [facility] to seee if would make a difference. Risk factors for coronary artery disease include male sex, hypertension, dyslipidemia and diabetes  mellitus.  Diabetes  He presents for his follow-up diabetic visit. He has type 2 diabetes mellitus. No MedicAlert identification noted. His disease course has been stable. Pertinent negatives for hypoglycemia include no confusion, dizziness or headaches. Pertinent negatives for diabetes include no chest pain, no fatigue, no polydipsia, no polyphagia, no polyuria and no visual change. Symptoms are stable. Risk factors for coronary artery disease include male sex, post-menopausal, dyslipidemia and diabetes mellitus. Current diabetic treatment includes oral agent (monotherapy). He is compliant with treatment all of the time. He sees a podiatrist.Eye exam is current.  Hypertension  This is a chronic problem. The current episode started more than 1 year ago. The problem is unchanged. The problem is controlled. Pertinent negatives include no chest pain, headaches, palpitations or shortness of breath. Risk factors for coronary artery disease include dyslipidemia, male gender and diabetes mellitus. Past treatments include diuretics. There are no compliance problems.   CAD Sees cardiologist yearly. Currently on imdur. COPD combivent daily and advair- no recent flare ups- denies SOB Hx thrombocytopenia Has not had labs checked in awhile- no bruising  Neuropathy peripheral Numbness in feet most of time- mainly in toes- burning sensation on heels bil Intermittent memory loss As been doing well. No worse since last visit- wife encourages him to read and "exercise brain". He has appointment for dementia testing at [facility] in [place] . They stopped his zocor to see improves.     Review of Systems  Constitutional: Negative.  Negative for fatigue.  HENT: Negative.   Respiratory: Negative.  Negative for shortness of breath.   Cardiovascular: Negative.  Negative for chest pain and palpitations.  Endocrine: Negative for polydipsia, polyphagia and polyuria.  Genitourinary: Negative.   Neurological: Negative  for dizziness and headaches.  Psychiatric/Behavioral: Negative for confusion.       Objective:   Physical Exam  Constitutional: He is oriented to person, place, and time. He appears well-developed and well-nourished.  HENT:  Head: Normocephalic.  Eyes: Conjunctivae are normal. Pupils are equal, round, and reactive to light.  Cardiovascular: Normal rate and regular rhythm.   Pulmonary/Chest: Effort normal and breath sounds normal. No respiratory distress. He has no wheezes. He has no rales. He exhibits no tenderness.  Genitourinary:  Genitourinary Comments: No CVAtenderness  Musculoskeletal: Normal range of motion.  Neurological: He is alert and oriented to person, place, and time. He has normal reflexes.  Skin: Skin is warm.  Erythematous annular lesion on right upper inner thigh  Psychiatric: He has a normal mood and affect. His behavior is normal. Judgment and thought content normal.   BP 128/82 (BP Location: Left Arm, Cuff Size: Normal)   Pulse 71   Temp (!) 96.9 F (36.1 C) (Oral)   Ht '5\' 5"'  (1.651 m)   Wt 165 lb (74.8 kg)   BMI 27.46 kg/m     hgba1c 6.1%    Assessment & Plan:   1. Type 2 diabetes mellitus with diabetic polyneuropathy, without long-term current use of insulin (HCC) Continue to watch carbs in diet - Bayer DCA Hb A1c Waived  2. Hyperlipidemia, unspecified hyperlipidemia type Low fat diet Continue to hold simvastatin - Lipid panel  3. Essential hypertension, benign Low sodium diet - CMP14+EGFR  4. Chronic stable angina ([place])  5. Coronary artery disease, non-occlusive  6. Chronic bronchitis, unspecified chronic bronchitis type ([place])  7. Hyperlipidemia associated with type 2 diabetes mellitus ([place])  8. Other mononeuropathy Do not go bare footed  9. BMI 30.0-30.9,adult Discussed diet and exercise for person with BMI >25 Will recheck weight in 3-6 months  10. Intermittent memory loss Brain exercises on computer    Labs  pending Health maintenance reviewed Diet and exercise encouraged Continue all meds Follow up  In 3 months   [place], FNP

## 2016-08-11 NOTE — Patient Instructions (Signed)

## 2016-08-12 LAB — LIPID PANEL
CHOL/HDL RATIO: 4.9 ratio (ref 0.0–5.0)
Cholesterol, Total: 228 mg/dL — ABNORMAL HIGH (ref 100–199)
HDL: 47 mg/dL (ref >39)
LDL Calculated: 144 mg/dL — ABNORMAL HIGH (ref 0–99)
TRIGLYCERIDES: 185 mg/dL — AB (ref 0–149)
VLDL Cholesterol Cal: 37 mg/dL (ref 5–40)

## 2016-08-12 LAB — CMP14+EGFR
A/G RATIO: 2 (ref 1.2–2.2)
ALT: 24 IU/L (ref 0–44)
AST: 21 IU/L (ref 0–40)
Albumin: 4.6 g/dL (ref 3.5–4.8)
Alkaline Phosphatase: 61 IU/L (ref 39–117)
BUN/Creatinine Ratio: 18 (ref 10–24)
BUN: 21 mg/dL (ref 8–27)
Bilirubin Total: 1.7 mg/dL — ABNORMAL HIGH (ref 0.0–1.2)
CALCIUM: 9.3 mg/dL (ref 8.6–10.2)
CHLORIDE: 97 mmol/L (ref 96–106)
CO2: 25 mmol/L (ref 18–29)
Creatinine, Ser: 1.16 mg/dL (ref 0.76–1.27)
GFR calc Af Amer: 72 mL/min/{1.73_m2} (ref >59)
GFR, EST NON AFRICAN AMERICAN: 62 mL/min/{1.73_m2} (ref >59)
GLUCOSE: 119 mg/dL — AB (ref 65–99)
Globulin, Total: 2.3 g/dL (ref 1.5–4.5)
POTASSIUM: 4.3 mmol/L (ref 3.5–5.2)
Sodium: 139 mmol/L (ref 134–144)
Total Protein: 6.9 g/dL (ref 6.0–8.5)

## 2016-08-19 ENCOUNTER — Other Ambulatory Visit: Payer: Self-pay | Admitting: Nurse Practitioner

## 2016-08-19 ENCOUNTER — Telehealth: Payer: Self-pay | Admitting: Nurse Practitioner

## 2016-08-19 DIAGNOSIS — F0390 Unspecified dementia without behavioral disturbance: Secondary | ICD-10-CM

## 2016-08-19 NOTE — Telephone Encounter (Signed)
Please review and advise.

## 2016-08-19 NOTE — Progress Notes (Signed)
Referral made to neurology- can still take a couple of months to get appointment

## 2016-08-26 ENCOUNTER — Other Ambulatory Visit: Payer: Medicare Other

## 2016-08-26 DIAGNOSIS — Z1212 Encounter for screening for malignant neoplasm of rectum: Secondary | ICD-10-CM

## 2016-08-28 LAB — FECAL OCCULT BLOOD, IMMUNOCHEMICAL: FECAL OCCULT BLD: NEGATIVE

## 2016-09-02 NOTE — Telephone Encounter (Signed)
[facility]

## 2016-09-30 ENCOUNTER — Encounter: Payer: Self-pay | Admitting: Neurology

## 2016-09-30 ENCOUNTER — Ambulatory Visit (INDEPENDENT_AMBULATORY_CARE_PROVIDER_SITE_OTHER): Payer: Medicare Other | Admitting: Neurology

## 2016-09-30 VITALS — BP 160/79 | HR 79 | Ht 65.0 in | Wt 163.5 lb

## 2016-09-30 DIAGNOSIS — E538 Deficiency of other specified B group vitamins: Secondary | ICD-10-CM | POA: Diagnosis not present

## 2016-09-30 DIAGNOSIS — R413 Other amnesia: Secondary | ICD-10-CM | POA: Diagnosis not present

## 2016-09-30 MED ORDER — DONEPEZIL HCL 5 MG PO TABS: 5.0000 mg | ORAL_TABLET | Freq: Every day | ORAL | 1 refills | Status: DC

## 2016-09-30 NOTE — Patient Instructions (Signed)
   We will get blood work today, and get MRI of the brain.  Begin Aricept (donepezil) at 5 mg at night for one month. If this medication is well-tolerated, please call our office and we will call in a prescription for the 10 mg tablets. Look out for side effects that may include nausea, diarrhea, weight loss, or stomach cramps. This medication will also cause a runny nose, therefore there is no need for allergy medications for this purpose.

## 2016-09-30 NOTE — Progress Notes (Signed)
Reason for visit: Memory disturbance  Referring physician: Dr. Janith LimaMartin  Dean Harris is a 76 y.o. male  History of present illness:  Dean Harris is a 76 year old right-handed white male with a history of a progressive memory disturbance that began at least 6 months ago. The patient has had primary issues with language, he is having a lot of word finding problems and hesitation with speech. He does have some short-term memory issues as well, he will not remember a recent conversation. He may repeat himself. He is not reading as much, he has difficulty understanding "big words". The patient is operating a motor vehicle, but he is no longer driving much past his own driveway to get the mail. The patient's wife does the finances, she always has. The patient requires assistance with keeping up with medications and appointments. The patient is snoring at night, he has a fairly good energy level during the day, he will occasionally take a nap. The patient has diabetes, he reports some numbness in the feet, he has not had any significant issues with balance or any falls. He denies problems controlling the bowels or the bladder. The wife indicates that in 2016 he fell and hit his head without loss of consciousness. He never sought medical attention following the fall. The patient does have a maternal aunt at age 74 who has some memory issues. Otherwise, there is no significant family history of memory disturbances.  Past Medical History:  Diagnosis Date  . Chronic stable angina (HCC)   . 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 Depression   . Diabetes mellitus   . Essential hypertension   . Hyperlipidemia with target LDL less than 70   . 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 Pneumonia    x2  . 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 This has helped his memory issues.  . Vitamin D deficiency     Past Surgical History:  Procedure Laterality Date  . CAROTID ENDARTERECTOMY    . LEFT HEART CATHETERIZATION WITH CORONARY ANGIOGRAM N/A 02/13/2012   Procedure: LEFT HEART CATHETERIZATION WITH CORONARY ANGIOGRAM;  Surgeon: Donato SchultzMark Skains, MD;  [facility];  Service: Cardiovascular: Diffuse mid LAD 50%, 50%pOM1, 50%mRCA -> medical management  . TRANSTHORACIC ECHOCARDIOGRAM  01/2012   EF 55-60%. Mild LVH. No RWMA, mild Aortic Stenosis (mean/peak gradient 13 mmHg/21 mmHg)  . TRANSTHORACIC ECHOCARDIOGRAM  02/07/2016   EF 60-65% with normal wall motion. Normal diastolic function for age. Moderate aortic stenosis (mean/peak gradient 24/35 mmHg)  . US CAROTID DOPPLER BILATERAL ([facility] HX) Bilateral 02/05/2016   Stable mild-to-moderate disease with bilateral heterogeneous plaque. RICA - 1-39%, LICA 40-59%. Bilateral subclavian and vertebral arteries normal.    Family History  Problem Relation Age of Onset  . Diabetes Mother   . Stroke Mother   . Hypertension Mother   . Hyperlipidemia Mother   . Cancer Father   . Hypertension Brother   . Cancer Other     Social history:  reports that he quit smoking about 28 years ago. His smoking use included Cigarettes. He has never used smokeless tobacco. He reports that he does not drink alcohol or use drugs.  Medications:  Prior to Admission medications   Medication Sig Start Date End  Date Taking? Authorizing Provider  albuterol-ipratropium (COMBIVENT) 18-103 MCG/ACT inhaler Inhale 1 puff into the lungs every 6 (six) hours as needed. 05/09/16  Yes [name], FNP  aspirin 325 MG tablet Take 325 mg by mouth daily.   Yes Historical Provider, MD  b complex vitamins tablet Take 1 tablet by mouth daily.   Yes Historical Provider, MD  Cholecalciferol (VITAMIN D) 2000 UNITS tablet Take 2,000 Units by  mouth daily.    Yes Historical Provider, MD  Fluticasone-Salmeterol (ADVAIR DISKUS) 100-50 MCG/DOSE AEPB Inhale 1 puff into the lungs every 12 (twelve) hours. 05/09/16  Yes [name], FNP  glucose blood (FREESTYLE LITE) test strip Test 1X per day and prn   Dx. 250.02 08/22/13  Yes [name], FNP  isosorbide mononitrate (IMDUR) 30 MG 24 hr tablet Take 1 tablet (30 mg total) by mouth daily. 05/09/16  Yes [name], FNP  lisinopril (PRINIVIL,ZESTRIL) 10 MG tablet Take 1 tablet (10 mg total) by mouth daily. 05/09/16  Yes [name], FNP  metFORMIN (GLUCOPHAGE) 500 MG tablet Take 0.5 tablets (250 mg total) by mouth 2 (two) times daily with a meal. 05/09/16  Yes [name], FNP  nitroGLYCERIN (NITROSTAT) 0.4 MG SL tablet Place 63 tablets (25 mg total) under the tongue every 5 (five) minutes as needed for chest pain. 01/21/16  Yes Marykay Lex, MD  sertraline (ZOLOFT) 100 MG tablet Take 100 mg by mouth daily.   Yes Historical Provider, MD  travoprost, benzalkonium, (TRAVATAN) 0.004 % ophthalmic solution 1 drop at bedtime.   Yes Historical Provider, MD  donepezil (ARICEPT) 5 MG tablet Take 1 tablet (5 mg total) by mouth at bedtime. 09/30/16   Dean Spaniel, MD      Allergies  Allergen Reactions  . Dust Mite Extract   . Mold Extract [Trichophyton]   . Pollen Extract   . Tetracyclines & Related     Generic only    ROS:  Out of a complete 14 system review of symptoms, the patient complains only of the following symptoms, and all other reviewed systems are negative.  Weight loss Chest pain, heart murmur Hearing loss Cough, snoring Feeling cold, increased thirst Joint pain, muscle cramps Allergies Impotence Itching, moles Memory loss, confusion, numbness Depression Restless legs  Blood pressure (!) 160/79, pulse 79, height 5\' 5"  (1.651 m), weight 163 lb 8 oz (74.2 kg).  Physical Exam  General: The patient is alert and cooperative at the  time of the examination.  Eyes: Pupils are equal, round, and reactive to light. Discs are flat bilaterally.  Neck: The neck is supple, there is radiation into both carotids from the aortic murmur.  Respiratory: The respiratory examination is clear.  Cardiovascular: The cardiovascular examination reveals a regular rate and rhythm, a grade II/VI aortic murmur with a blowing quality is noted.  Skin: Extremities are without significant edema.  Neurologic Exam  Mental status: The patient is alert and oriented x 2 at the time of the examination (not oriented to date). The Mini-Mental Status Examination done today shows a total score 21/30..  Cranial nerves: Facial symmetry is present. There is good sensation of the face to pinprick and soft touch bilaterally. The strength of the facial muscles and the muscles to head turning and shoulder shrug are normal bilaterally. Speech is well enunciated, no aphasia or dysarthria is noted. Extraocular movements are full. Visual fields are full. The tongue is midline, and the patient has symmetric elevation of the soft palate. No obvious hearing deficits  are noted.  Motor: The motor testing reveals 5 over 5 strength of all 4 extremities. Good symmetric motor tone is noted throughout.  Sensory: Sensory testing is intact to pinprick, soft touch, vibration sensation, and position sense on all 4 extremities, with exception of slight impairment of position sense in both feet. No evidence of extinction is noted.  Coordination: Cerebellar testing reveals good finger-nose-finger and heel-to-shin bilaterally.  Gait and station: Gait is normal. Tandem gait is slightly unsteady. Romberg is negative. No drift is seen.  Reflexes: Deep tendon reflexes are symmetric and normal bilaterally. Toes are downgoing bilaterally.   Assessment/Plan:  1. Memory disturbance  The patient is having some difficulty with short-term memory, but he is also having issues with language  function that is somewhat out of proportion to his underlying memory disturbance. The patient does have risk factors for stroke, he has diabetes and he has had bilateral carotid endarterectomies. The patient will be sent for MRI of the brain to exclude cerebrovascular disease. He will undergo blood work today, he will be started on low-dose Aricept. The wife is to call our office after one month to increase the dose of Aricept to the 10 mg maintenance dose if he is tolerating the medication well. He will follow-up in 6 months.  Marlan Palau MD 09/30/2016 11:49 AM  [facility] [address] [place], [place] [address]  Phone [phone] Fax [phone]

## 2016-10-01 LAB — RPR: RPR: NONREACTIVE

## 2016-10-01 LAB — VITAMIN B12: VITAMIN B 12: 976 pg/mL (ref 232–1245)

## 2016-10-01 LAB — SEDIMENTATION RATE: Sed Rate: 6 mm/hr (ref 0–30)

## 2016-10-10 ENCOUNTER — Ambulatory Visit
Admission: RE | Admit: 2016-10-10 | Discharge: 2016-10-10 | Disposition: A | Discharge status: Home / Self Care | Payer: Medicare Other | Source: Ambulatory Visit | Attending: Neurology | Admitting: Neurology

## 2016-10-10 DIAGNOSIS — R413 Other amnesia: Secondary | ICD-10-CM | POA: Diagnosis not present

## 2016-10-12 ENCOUNTER — Telehealth: Payer: Self-pay | Admitting: Neurology

## 2016-10-12 NOTE — Telephone Encounter (Signed)
  I called the patient. I talked with the wife. 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  MRI brain 10/11/16:  IMPRESSION:  This MRI of the brain without contrast shows the following: 1.   Moderate generalized atrophy that is more severe in the mesial temporal lobes. This has progressed when compared to the 11/08/2006 MRI. 2.   Scatterred T2/FLAIR hyperintense foci with a small confluency in the right periatrial white matter consistent with mild chronic microvascular ischemic change, progressed when compared to the 2008 MRI.    3.   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   4.   There are no acute findings.

## 2016-11-12 ENCOUNTER — Ambulatory Visit (INDEPENDENT_AMBULATORY_CARE_PROVIDER_SITE_OTHER): Payer: Medicare Other | Admitting: Nurse Practitioner

## 2016-11-12 ENCOUNTER — Encounter: Payer: Self-pay | Admitting: Nurse Practitioner

## 2016-11-12 VITALS — BP 156/68 | HR 60 | Temp 96.9°F | Ht 65.0 in | Wt 161.0 lb

## 2016-11-12 DIAGNOSIS — D696 Thrombocytopenia, unspecified: Secondary | ICD-10-CM

## 2016-11-12 DIAGNOSIS — E1142 Type 2 diabetes mellitus with diabetic polyneuropathy: Secondary | ICD-10-CM

## 2016-11-12 DIAGNOSIS — R413 Other amnesia: Secondary | ICD-10-CM

## 2016-11-12 DIAGNOSIS — G588 Other specified mononeuropathies: Secondary | ICD-10-CM | POA: Diagnosis not present

## 2016-11-12 DIAGNOSIS — I2510 Atherosclerotic heart disease of native coronary artery without angina pectoris: Secondary | ICD-10-CM

## 2016-11-12 DIAGNOSIS — I208 Other forms of angina pectoris: Secondary | ICD-10-CM

## 2016-11-12 DIAGNOSIS — F339 Major depressive disorder, recurrent, unspecified: Secondary | ICD-10-CM

## 2016-11-12 DIAGNOSIS — I10 Essential (primary) hypertension: Secondary | ICD-10-CM

## 2016-11-12 DIAGNOSIS — J42 Unspecified chronic bronchitis: Secondary | ICD-10-CM | POA: Diagnosis not present

## 2016-11-12 DIAGNOSIS — E785 Hyperlipidemia, unspecified: Secondary | ICD-10-CM | POA: Diagnosis not present

## 2016-11-12 DIAGNOSIS — Z6830 Body mass index (BMI) 30.0-30.9, adult: Secondary | ICD-10-CM

## 2016-11-12 DIAGNOSIS — I6523 Occlusion and stenosis of bilateral carotid arteries: Secondary | ICD-10-CM

## 2016-11-12 LAB — BAYER DCA HB A1C WAIVED: HB A1C: 6.7 % (ref <7.0)

## 2016-11-12 MED ORDER — FREESTYLE LANCETS MISC: 12 refills | Status: DC

## 2016-11-12 MED ORDER — DONEPEZIL HCL 5 MG PO TABS: 5.0000 mg | ORAL_TABLET | Freq: Every day | ORAL | 1 refills | Status: DC

## 2016-11-12 MED ORDER — ISOSORBIDE MONONITRATE ER 30 MG PO TB24: 30.0000 mg | ORAL_TABLET | Freq: Every day | ORAL | 5 refills | Status: DC

## 2016-11-12 MED ORDER — METFORMIN HCL 500 MG PO TABS: 250.0000 mg | ORAL_TABLET | Freq: Two times a day (BID) | ORAL | 5 refills | Status: DC

## 2016-11-12 MED ORDER — LISINOPRIL 10 MG PO TABS: 10.0000 mg | ORAL_TABLET | Freq: Every day | ORAL | 5 refills | Status: DC

## 2016-11-12 MED ORDER — SERTRALINE HCL 100 MG PO TABS: 100.0000 mg | ORAL_TABLET | Freq: Every day | ORAL | 1 refills | Status: DC

## 2016-11-12 MED ORDER — FLUTICASONE-SALMETEROL 100-50 MCG/DOSE IN AEPB: 1.0000 | INHALATION_SPRAY | Freq: Two times a day (BID) | RESPIRATORY_TRACT | 5 refills | Status: DC

## 2016-11-12 MED ORDER — GLUCOSE BLOOD VI STRP: ORAL_STRIP | 12 refills | Status: DC

## 2016-11-12 NOTE — Progress Notes (Signed)
Subjective:    Patient ID: Dean GlassDaniel C Spinella, male    DOB: 1940-12-13, 76 y.o.   MRN: [id number]   Patient here today for follow up of chronic medical problems. No changes since last visit- no complaints today  Outpatient Encounter Prescriptions as of 11/12/2016  Medication Sig  . albuterol-ipratropium (COMBIVENT) 18-103 MCG/ACT inhaler Inhale 1 puff into the lungs every 6 (six) hours as needed.  Marland Kitchen. aspirin 325 MG tablet Take 325 mg by mouth daily.  Marland Kitchen. b complex vitamins tablet Take 1 tablet by mouth daily.  . Cholecalciferol (VITAMIN D) 2000 UNITS tablet Take 2,000 Units by mouth daily.   Marland Kitchen. donepezil (ARICEPT) 5 MG tablet Take 1 tablet (5 mg total) by mouth at bedtime.  . Fluticasone-Salmeterol (ADVAIR DISKUS) 100-50 MCG/DOSE AEPB Inhale 1 puff into the lungs every 12 (twelve) hours.  Marland Kitchen. glucose blood (FREESTYLE LITE) test strip Test 1X per day and prn   Dx. 250.02  . isosorbide mononitrate (IMDUR) 30 MG 24 hr tablet Take 1 tablet (30 mg total) by mouth daily.  Marland Kitchen. lisinopril (PRINIVIL,ZESTRIL) 10 MG tablet Take 1 tablet (10 mg total) by mouth daily.  . metFORMIN (GLUCOPHAGE) 500 MG tablet Take 0.5 tablets (250 mg total) by mouth 2 (two) times daily with a meal.  . nitroGLYCERIN (NITROSTAT) 0.4 MG SL tablet Place 63 tablets (25 mg total) under the tongue every 5 (five) minutes as needed for chest pain.  Marland Kitchen. sertraline (ZOLOFT) 100 MG tablet Take 100 mg by mouth daily.  . travoprost, benzalkonium, (TRAVATAN) 0.004 % ophthalmic solution 1 drop at bedtime.   No facility-administered encounter medications on file as of 11/12/2016.    Hyperlipidemia  This is a chronic problem. The current episode started more than 1 year ago. The problem is controlled. Recent lipid tests were reviewed and are normal. Pertinent negatives include no chest pain or shortness of breath. Treatments tried: [facility] to seee if would make a difference. Risk factors for coronary artery disease  include male sex, hypertension, dyslipidemia and diabetes mellitus.  Diabetes  He presents for his follow-up diabetic visit. He has type 2 diabetes mellitus. No MedicAlert identification noted. His disease course has been stable. Pertinent negatives for hypoglycemia include no confusion, dizziness or headaches. Pertinent negatives for diabetes include no chest pain, no fatigue, no polydipsia, no polyphagia, no polyuria and no visual change. Symptoms are stable. Risk factors for coronary artery disease include male sex, post-menopausal, dyslipidemia and diabetes mellitus. Current diabetic treatment includes oral agent (monotherapy). He is compliant with treatment all of the time. He sees a podiatrist.Eye exam is current.  Hypertension  This is a chronic problem. The current episode started more than 1 year ago. The problem is unchanged. The problem is controlled. Pertinent negatives include no chest pain, headaches, palpitations or shortness of breath. Risk factors for coronary artery disease include dyslipidemia, male gender and diabetes mellitus. Past treatments include diuretics. There are no compliance problems.   CAD Sees cardiologist yearly. Currently on imdur. COPD combivent daily and advair- no recent flare ups- denies SOB Hx thrombocytopenia Has not had labs checked in awhile- no bruising  Neuropathy peripheral Numbness in feet most of time- mainly in toes- burning sensation on heels bil Intermittent memory loss/memory disorder As been doing well. No worse since last visit- wife encourages him to read and "exercise brain". [facility] [place] . They stopped his zocor to see improves. Chronic  stable angina Had some chest pain yesterday- was relieved with 1 nitro   Review of Systems  Constitutional: Negative.  Negative for fatigue.  HENT: Negative.   Respiratory: Negative.  Negative for shortness of breath.   Cardiovascular: Negative.  Negative  for chest pain and palpitations.  Endocrine: Negative for polydipsia, polyphagia and polyuria.  Genitourinary: Negative.   Neurological: Negative for dizziness and headaches.  Psychiatric/Behavioral: Negative for confusion.       Objective:   Physical Exam  Constitutional: He is oriented to person, place, and time. He appears well-developed and well-nourished.  HENT:  Head: Normocephalic.  Eyes: Conjunctivae are normal. Pupils are equal, round, and reactive to light.  Cardiovascular: Normal rate and regular rhythm.   Murmur (3/6) heard. Pulmonary/Chest: Effort normal and breath sounds normal. No respiratory distress. He has no wheezes. He has no rales. He exhibits no tenderness.  Genitourinary:  Genitourinary Comments: No CVAtenderness  Musculoskeletal: Normal range of motion.  Neurological: He is alert and oriented to person, place, and time. He has normal reflexes.  Skin: Skin is warm.  Psychiatric: He has a normal mood and affect. His behavior is normal. Judgment and thought content normal.   BP (!) 156/68   Pulse 60   Temp (!) 96.9 F (36.1 C) (Oral)   Ht 5\' 5"  (1.651 m)   Wt 161 lb (73 kg)   BMI 26.79 kg/m    hgba1c 6.7% up from 6.1% at last visit    Assessment & Plan:   1. Type 2 diabetes mellitus with diabetic polyneuropathy, without long-term current use of insulin (HCC)   2. Hyperlipidemia, unspecified hyperlipidemia type   3. Essential hypertension, benign   4. Carotid artery plaque, bilateral; s/p Bilateral CEA   5. Chronic stable angina (HCC)   6. Chronic bronchitis, unspecified chronic bronchitis type (HCC)   7. Other mononeuropathy   8. BMI 30.0-30.9,adult   9. Memory disorder   10. Thrombocytopenia (HCC)   11. Intermittent memory loss   12. Coronary artery disease, non-occlusive   13. Depression, recurrent (HCC)    Labs pending'  [facility] Low fat diet  [name], FNP  Continue to watch carbs in  diet

## 2016-11-12 NOTE — Patient Instructions (Signed)
Dementia Dementia means losing some of your brain ability. People with dementia may have problems with:  Memory.  Making decisions.  Behavior.  Speaking.  Thinking.  Solving problems. Follow these instructions at home: Medicine   Take over-the-counter and prescription medicines only as told by your doctor.  Avoid taking medicines that can change how you think. These include pain or sleeping medicines. Lifestyle    Make healthy choices:  Be active as told by your doctor.  Do not use any tobacco products, such as cigarettes, chewing tobacco, and e-cigarettes. If you need help quitting, ask your doctor.  Eat a healthy diet.  When you get stressed, do something to help yourself relax. Your doctor can give you tips.  Spend time with other people.  Drink enough fluid to keep your pee (urine) clear or pale yellow.  Make sure you get good sleep. Use these tips to help you get a good night's rest:  Try not to take naps during the day.  Keep your sleeping area dark and cool.  In the few hours before you go to bed, try not to do any exercise.  Try not to have foods and drinks with caffeine in the evening. General instructions   Talk with your doctor to figure out:  What you need help with.  What your safety needs are.  If you were given a bracelet that tracks your location, make sure to wear it.  Keep all follow-up visits as told by your doctor. This is important. Contact a doctor if:  You have any new problems.  You have problems with choking or swallowing.  You have any symptoms of a different sickness. Get help right away if:  You have a fever.  You feel mixed up (confused) or more mixed up than before.  You have new sleepiness.  You have sleepiness that gets worse.  You have a hard time staying awake.  You or your family members are worried for your safety. This information is not intended to replace advice given to you by your health care  provider. Make sure you discuss any questions you have with your health care provider. Document Released: 08/14/2008 Document Revised: 02/07/2016 Document Reviewed: 05/30/2015 Elsevier Interactive Patient Education  2017 Elsevier Inc.  

## 2016-11-12 NOTE — Addendum Note (Signed)
Addended by: Bennie PieriniMARTIN, MARY-MARGARET on: 11/12/2016 12:51 PM   Modules accepted: Orders

## 2016-11-13 ENCOUNTER — Telehealth: Payer: Self-pay | Admitting: Nurse Practitioner

## 2016-11-13 LAB — CMP14+EGFR
A/G RATIO: 2 (ref 1.2–2.2)
ALBUMIN: 4.5 g/dL (ref 3.5–4.8)
ALK PHOS: 73 IU/L (ref 39–117)
ALT: 19 IU/L (ref 0–44)
AST: 21 IU/L (ref 0–40)
BUN/Creatinine Ratio: 13 (ref 10–24)
BUN: 14 mg/dL (ref 8–27)
Bilirubin Total: 1.3 mg/dL — ABNORMAL HIGH (ref 0.0–1.2)
CO2: 24 mmol/L (ref 18–29)
Calcium: 9.4 mg/dL (ref 8.6–10.2)
Chloride: 98 mmol/L (ref 96–106)
Creatinine, Ser: 1.08 mg/dL (ref 0.76–1.27)
GFR calc non Af Amer: 68 mL/min/{1.73_m2} (ref >59)
GFR, EST AFRICAN AMERICAN: 78 mL/min/{1.73_m2} (ref >59)
GLOBULIN, TOTAL: 2.3 g/dL (ref 1.5–4.5)
GLUCOSE: 129 mg/dL — AB (ref 65–99)
POTASSIUM: 4.7 mmol/L (ref 3.5–5.2)
SODIUM: 140 mmol/L (ref 134–144)
TOTAL PROTEIN: 6.8 g/dL (ref 6.0–8.5)

## 2016-11-13 LAB — LIPID PANEL
CHOLESTEROL TOTAL: 192 mg/dL (ref 100–199)
Chol/HDL Ratio: 4.1 ratio units (ref 0.0–5.0)
HDL: 47 mg/dL (ref >39)
LDL Calculated: 121 mg/dL — ABNORMAL HIGH (ref 0–99)
Triglycerides: 121 mg/dL (ref 0–149)
VLDL CHOLESTEROL CAL: 24 mg/dL (ref 5–40)

## 2016-11-13 NOTE — Telephone Encounter (Signed)
Called with lab results.  Patients wife states that patient was taking simvastatin and cardiologist discontinued due to memory loss

## 2016-11-13 NOTE — Telephone Encounter (Signed)
That is okay- just know that LDL are elvated- so strict low fat diet

## 2016-11-14 NOTE — Telephone Encounter (Signed)
Just watch his diet

## 2016-11-14 NOTE — Telephone Encounter (Signed)
Wife aware and wants to know if he can have a different medication that will not affect his memory like the simvastatin does? Please advise and send back to the pools.

## 2016-11-14 NOTE — Telephone Encounter (Signed)
Wife aware of recommendation

## 2016-11-20 ENCOUNTER — Telehealth: Payer: Self-pay | Admitting: Neurology

## 2016-11-20 NOTE — Telephone Encounter (Signed)
Pt wanted Dr. Anne HahnWillis to know he is doing well on donepezil.

## 2016-11-20 NOTE — Telephone Encounter (Signed)
Dr Willis- FYI only 

## 2016-12-16 ENCOUNTER — Encounter (HOSPITAL_COMMUNITY): Payer: Medicare Other | Attending: Oncology | Admitting: Oncology

## 2016-12-16 ENCOUNTER — Encounter (HOSPITAL_COMMUNITY): Payer: Self-pay | Admitting: Oncology

## 2016-12-16 ENCOUNTER — Encounter (HOSPITAL_COMMUNITY): Payer: Medicare Other

## 2016-12-16 VITALS — BP 141/50 | HR 65 | Temp 97.8°F | Resp 18 | Ht 63.0 in | Wt 158.0 lb

## 2016-12-16 DIAGNOSIS — Z9889 Other specified postprocedural states: Secondary | ICD-10-CM | POA: Insufficient documentation

## 2016-12-16 DIAGNOSIS — E785 Hyperlipidemia, unspecified: Secondary | ICD-10-CM | POA: Insufficient documentation

## 2016-12-16 DIAGNOSIS — Z809 Family history of malignant neoplasm, unspecified: Secondary | ICD-10-CM | POA: Diagnosis not present

## 2016-12-16 DIAGNOSIS — R413 Other amnesia: Secondary | ICD-10-CM

## 2016-12-16 DIAGNOSIS — Z8249 Family history of ischemic heart disease and other diseases of the circulatory system: Secondary | ICD-10-CM | POA: Diagnosis not present

## 2016-12-16 DIAGNOSIS — F4310 Post-traumatic stress disorder, unspecified: Secondary | ICD-10-CM | POA: Insufficient documentation

## 2016-12-16 DIAGNOSIS — D696 Thrombocytopenia, unspecified: Secondary | ICD-10-CM | POA: Diagnosis not present

## 2016-12-16 DIAGNOSIS — F329 Major depressive disorder, single episode, unspecified: Secondary | ICD-10-CM | POA: Insufficient documentation

## 2016-12-16 DIAGNOSIS — Z87891 Personal history of nicotine dependence: Secondary | ICD-10-CM | POA: Diagnosis not present

## 2016-12-16 DIAGNOSIS — I350 Nonrheumatic aortic (valve) stenosis: Secondary | ICD-10-CM | POA: Diagnosis not present

## 2016-12-16 DIAGNOSIS — Z833 Family history of diabetes mellitus: Secondary | ICD-10-CM | POA: Insufficient documentation

## 2016-12-16 DIAGNOSIS — I2510 Atherosclerotic heart disease of native coronary artery without angina pectoris: Secondary | ICD-10-CM | POA: Insufficient documentation

## 2016-12-16 DIAGNOSIS — E119 Type 2 diabetes mellitus without complications: Secondary | ICD-10-CM | POA: Diagnosis not present

## 2016-12-16 DIAGNOSIS — I10 Essential (primary) hypertension: Secondary | ICD-10-CM | POA: Diagnosis not present

## 2016-12-16 LAB — CBC WITH DIFFERENTIAL/PLATELET
BASOS ABS: 0.1 10*3/uL (ref 0.0–0.1)
BASOS PCT: 2 %
EOS ABS: 0.3 10*3/uL (ref 0.0–0.7)
Eosinophils Relative: 7 %
HCT: 39.6 % (ref 39.0–52.0)
Hemoglobin: 13.6 g/dL (ref 13.0–17.0)
Lymphocytes Relative: 41 %
Lymphs Abs: 2.1 10*3/uL (ref 0.7–4.0)
MCH: 29.2 pg (ref 26.0–34.0)
MCHC: 34.3 g/dL (ref 30.0–36.0)
MCV: 85.2 fL (ref 78.0–100.0)
Monocytes Absolute: 0.4 10*3/uL (ref 0.1–1.0)
Monocytes Relative: 7 %
NEUTROS PCT: 43 %
Neutro Abs: 2.3 10*3/uL (ref 1.7–7.7)
PLATELETS: 136 10*3/uL — AB (ref 150–400)
RBC: 4.65 MIL/uL (ref 4.22–5.81)
RDW: 13.3 % (ref 11.5–15.5)
WBC: 5.2 10*3/uL (ref 4.0–10.5)

## 2016-12-16 NOTE — Assessment & Plan Note (Signed)
Heard on exam.  ECHO is up to date.  Followed by cardiology.

## 2016-12-16 NOTE — Patient Instructions (Addendum)
[facility] at [facility] Discharge Instructions  RECOMMENDATIONS MADE BY THE CONSULTANT AND ANY TEST RESULTS WILL BE SENT TO YOUR REFERRING PHYSICIAN.  You were seen today by Tom Kefalas PA-C. Return in 12 months for labs and follow up.    Thank you for choosing [facility] at [facility] to provide your oncology and hematology care.  To afford each patient quality time with our provider, please arrive at least 15 minutes before your scheduled appointment time.    If you have a lab appointment with the Cancer Center please come in thru the  Main Entrance and check in at the main information desk  You need to re-schedule your appointment should you arrive 10 or more minutes late.  We strive to give you quality time with our providers, and arriving late affects you and other patients whose appointments are after yours.  Also, if you no show three or more times for appointments you may be dismissed from the clinic at the providers discretion.     Again, thank you for choosing [facility].  Our hope is that these requests will decrease the amount of time that you wait before being seen by our physicians.       _____________________________________________________________  Should you have questions after your visit to [facility], please contact our office at [phone] between the hours of 8:30 a.m. and 4:30 p.m.  Voicemails left after 4:30 p.m. will not be returned until the following business day.  For prescription refill requests, have your pharmacy contact our office.       Resources For Cancer Patients and their Caregivers ? American Cancer Society: Can assist with transportation, wigs, general needs, runs Look Good Feel Better.        1-888-227-6333 ? Cancer Care: Provides financial assistance, online support groups, medication/co-pay assistance.  1-800-813-HOPE (4673) ? [facility] Assists [place] cancer patients and their families through emotional , educational and financial support.  336-427-4357 ? [place] DSS Where to apply for food stamps, Medicaid and utility assistance. 336-342-1394 ? [facility]: Transportation to medical appointments. 336-347-2287 ? Social Security Administration: May apply for disability if have a Stage IV cancer. 336-342-7796 1-800-772-1213 ? [place] Aging, Disability and Transit Services: Assists with nutrition, care and transit needs. 336-349-2343  Cancer Center Support Programs: [email or web address]@ > Cancer Support Group  2nd Tuesday of the month 1pm-2pm, Journey Room  > Creative Journey  3rd Tuesday of the month 1130am-1pm, Journey Room  > Look Good Feel Better  1st Wednesday of the month 10am-12 noon, Journey Room (Call American Cancer Society to register 1-800-395-5775)    

## 2016-12-16 NOTE — Progress Notes (Signed)
Dean Daphine Deutscher, FNP [address] [place] [address]  Thrombocytopenia [facility]) - Plan: CBC with Differential, CBC with Differential  Moderate aortic stenosis  CURRENT THERAPY: Observation  INTERVAL HISTORY: Dean Harris 76 y.o. male returns for followup of mild, chronic thrombocytopenia.  He reports a good year without any bleeding or bruising issues.  He is provided some education regarding bleeding and bruising.  He knows to call us with any issues related to this.  He has ongoing issues with memory.  He is on Aricept for this.  He notes that his memory really started getting significantly worse about 1 year ago.  He reports an excellent appetite and energy level.  He denies any new pain.  Review of Systems  Constitutional: Negative.  Negative for chills, fever and weight loss.  HENT: Negative.   Eyes: Negative.   Respiratory: Negative.  Negative for cough.   Cardiovascular: Negative.  Negative for chest pain.  Gastrointestinal: Negative.  Negative for blood in stool, constipation, diarrhea, melena, nausea and vomiting.  Genitourinary: Negative.   Musculoskeletal: Negative.   Skin: Negative.   Neurological: Negative.  Negative for weakness.  Endo/Heme/Allergies: Negative.   Psychiatric/Behavioral: Negative.     Past Medical History:  Diagnosis Date  . Chronic stable angina (HCC)   . 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 Depression   . Diabetes mellitus   . Essential hypertension   . Hyperlipidemia with target LDL less than 70   . 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 Pneumonia    x2  . PTSD (post-traumatic stress disorder) - with depression symptoms    Recently started on medication by the [place]. This has helped his memory issues.  . Thrombocytopenia (HCC)  04/29/2014  . Vitamin D deficiency     Past Surgical History:  Procedure Laterality Date  . CAROTID ENDARTERECTOMY    . LEFT HEART CATHETERIZATION WITH CORONARY ANGIOGRAM N/A 02/13/2012   Procedure: LEFT HEART CATHETERIZATION WITH CORONARY ANGIOGRAM;  Surgeon: Donato Schultz, MD;  Location: [facility] CATH LAB;  Service: Cardiovascular: Diffuse mid LAD 50%, 50%pOM1, 50%mRCA -> medical management  . TRANSTHORACIC ECHOCARDIOGRAM  01/2012   EF 55-60%. Mild LVH. No RWMA, mild Aortic Stenosis (mean/peak gradient 13 mmHg/21 mmHg)  . TRANSTHORACIC ECHOCARDIOGRAM  02/07/2016   EF 60-65% with normal wall motion. Normal diastolic function for age. Moderate aortic stenosis (mean/peak gradient 24/35 mmHg)  . US CAROTID DOPPLER BILATERAL ([facility] HX) Bilateral 02/05/2016   Stable mild-to-moderate disease with bilateral heterogeneous plaque. RICA - 1-39%, LICA 40-59%. Bilateral subclavian and vertebral arteries normal.    Family History  Problem Relation Age of Onset  . Diabetes Mother   . Stroke Mother   . Hypertension Mother   . Hyperlipidemia Mother   . Cancer Father   . Hypertension Brother   . Cancer Other     Social History   Social History  . Marital status: Married    Spouse name: N/A  . Number of children: 1  . Years of education: 54   Occupational History  . N/A    Social History Main Topics  . Smoking status: Former Smoker    Types: Cigarettes    Quit date: 02/10/1988  . Smokeless tobacco: Never Used  . Alcohol use No  . Drug use: No  . Sexual  activity: Not Asked     Comment: Married   Other Topics Concern  . None   Social History Narrative   Lives at home w/ her husband   Right-handed   Caffeine: 5-6 cups per day     PHYSICAL EXAMINATION  ECOG PERFORMANCE STATUS: 1 - Symptomatic but completely ambulatory  Vitals:   12/16/16 1155  BP: (!) 141/50  Pulse: 65  Resp: 18  Temp: 97.8 F (36.6 C)    GENERAL:alert, no distress, well nourished, well developed, comfortable,  cooperative, smiling and accompanied by his wife. SKIN: skin color, texture, turgor are normal, no rashes or significant lesions HEAD: Normocephalic, No masses, lesions, tenderness or abnormalities EYES: normal, EOMI, Conjunctiva are pink and non-injected EARS: External ears normal OROPHARYNX:lips, buccal mucosa, and tongue normal and mucous membranes are moist  NECK: supple, trachea midline LYMPH:  no palpable lymphadenopathy BREAST:not examined LUNGS: clear to auscultation  HEART: regular rate & rhythm and systolic murmur 1-2/6 identified. ABDOMEN:abdomen soft and normal bowel sounds BACK: Back symmetric, no curvature. EXTREMITIES:less then 2 second capillary refill, no joint deformities, effusion, or inflammation, no skin discoloration, no cyanosis  NEURO: alert & oriented x 3 with fluent speech, no focal motor/sensory deficits, gait normal   LABORATORY DATA: CBC    Component Value Date/Time   WBC 5.2 12/16/2016 1049   RBC 4.65 12/16/2016 1049   HGB 13.6 12/16/2016 1049   HCT 39.6 12/16/2016 1049   HCT 40.2 10/11/2015 1018   PLT 136 (L) 12/16/2016 1049   PLT 165 10/11/2015 1018   MCV 85.2 12/16/2016 1049   MCV 85 10/11/2015 1018   MCH 29.2 12/16/2016 1049   MCHC 34.3 12/16/2016 1049   RDW 13.3 12/16/2016 1049   RDW 13.8 10/11/2015 1018   LYMPHSABS 2.1 12/16/2016 1049   LYMPHSABS 1.9 10/11/2015 1018   MONOABS 0.4 12/16/2016 1049   EOSABS 0.3 12/16/2016 1049   EOSABS 0.2 10/11/2015 1018   BASOSABS 0.1 12/16/2016 1049   BASOSABS 0.1 10/11/2015 1018      Chemistry      Component Value Date/Time   NA 140 11/12/2016 1215   K 4.7 11/12/2016 1215   CL 98 11/12/2016 1215   CO2 24 11/12/2016 1215   BUN 14 11/12/2016 1215   CREATININE 1.08 11/12/2016 1215   CREATININE 1.14 02/09/2013 0954      Component Value Date/Time   CALCIUM 9.4 11/12/2016 1215   ALKPHOS 73 11/12/2016 1215   AST 21 11/12/2016 1215   ALT 19 11/12/2016 1215   BILITOT 1.3 (H) 11/12/2016 1215         PENDING LABS:   RADIOGRAPHIC STUDIES:  No results found.   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  Labs today: CBC diff.  I personally reviewed and went over laboratory results with the patient.  The results are noted within this dictation.  Labs in 12 months: CBC diff.  Medication list is reviewed and I do not see any medications that are well known to cause thrombocytopenia.  He is provided education regarding signs and symptoms of concern such as bruising that is unusual or new, specifically on the trunk or back.  We also discussed bleeding of gums. I advised him that at any time he develops any of these symptoms to notify us and we will bring him and promptly for a CBC and visit.   Problem list reviewed with patient and edited accordingly.  Medications are  reviewed with the patient and edited accordingly.  Return in 12 months for follow-up.  More than 50% of the time spent with the patient was utilized for counseling and coordination of care.  Moderate aortic stenosis Heard on exam.  ECHO is up to date.  Followed by cardiology.    ORDERS PLACED FOR THIS ENCOUNTER: Orders Placed This Encounter  Procedures  . CBC with Differential  . CBC with Differential    MEDICATIONS PRESCRIBED THIS ENCOUNTER: No orders of the defined types were placed in this encounter.   THERAPY PLAN:  Will continue to monitor blood count and patient is educated on signs/symptoms to report.  All questions were answered. The patient knows to call the clinic with any problems, questions or concerns. We can certainly see the patient much sooner if necessary.  Patient and plan discussed with Dr. Ralene Cork and she is in agreement with the aforementioned.   This note is electronically signed by: Tina Griffiths 12/16/2016 5:01 PM

## 2016-12-16 NOTE — Assessment & Plan Note (Addendum)
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  Labs today: CBC diff.  I personally reviewed and went over laboratory results with the patient.  The results are noted within this dictation.  Labs in 12 months: CBC diff.  Medication list is reviewed and I do not see any medications that are well known to cause thrombocytopenia.  He is provided education regarding signs and symptoms of concern such as bruising that is unusual or new, specifically on the trunk or back.  We also discussed bleeding of gums. I advised him that at any time he develops any of these symptoms to notify us and we will bring him and promptly for a CBC and visit.   Problem list reviewed with patient and edited accordingly.  Medications are reviewed with the patient and edited accordingly.  Return in 12 months for follow-up.  More than 50% of the time spent with the patient was utilized for counseling and coordination of care.

## 2017-01-20 ENCOUNTER — Encounter: Payer: Self-pay | Admitting: Cardiology

## 2017-01-20 ENCOUNTER — Ambulatory Visit (INDEPENDENT_AMBULATORY_CARE_PROVIDER_SITE_OTHER): Payer: Medicare Other | Admitting: Cardiology

## 2017-01-20 VITALS — BP 130/64 | HR 64 | Ht 63.0 in | Wt 155.0 lb

## 2017-01-20 DIAGNOSIS — I6523 Occlusion and stenosis of bilateral carotid arteries: Secondary | ICD-10-CM

## 2017-01-20 DIAGNOSIS — I2510 Atherosclerotic heart disease of native coronary artery without angina pectoris: Secondary | ICD-10-CM | POA: Diagnosis not present

## 2017-01-20 DIAGNOSIS — I10 Essential (primary) hypertension: Secondary | ICD-10-CM

## 2017-01-20 DIAGNOSIS — I350 Nonrheumatic aortic (valve) stenosis: Secondary | ICD-10-CM

## 2017-01-20 DIAGNOSIS — E785 Hyperlipidemia, unspecified: Secondary | ICD-10-CM | POA: Diagnosis not present

## 2017-01-20 DIAGNOSIS — E7800 Pure hypercholesterolemia, unspecified: Secondary | ICD-10-CM

## 2017-01-20 DIAGNOSIS — I208 Other forms of angina pectoris: Secondary | ICD-10-CM | POA: Diagnosis not present

## 2017-01-20 DIAGNOSIS — E1169 Type 2 diabetes mellitus with other specified complication: Secondary | ICD-10-CM

## 2017-01-20 NOTE — Progress Notes (Signed)
PCP: Bennie PieriniMartin, Mary-Margaret, FNP  Clinic Note: Chief Complaint  Patient presents with  . Follow-up    Aortic stenosis  . Coronary Artery Disease    Nonobstructive    HPI: Dean Harris is a 76 y.o. male with a PMH below who presents today for Six-month follow-up for CAD with chronic stable angina as well as Moderate AS by Echo. He has a chronic pulmonary condition Status post bilateral carotid endarterectomy after an episode of amaurosis fugax in 2004. .Prior to my seeing him, he been evaluated by Dr. [name] for chest pain in May 2013. His cardiac catheterization showed diffuse 50% disease in the mid LAD as well as mid RCA and proximal OM 1. Normal EF with moderately elevated LVEDP of 25mmHg.  Dean Harris was last seen on from 07/24/2016 - he was doing fairly well but recently having issues with memory. Only noticed some tightness or pressure in his chest if he was doing heavy exertion. With routine activity. Her syncope.   Hospitalizations: None  Studies Personally Reviewed - if available, images/films reviewed: From Epic Chart or Care Everywhere  Echo May 2017: 60-65%. NO RWMA. Mod AS  Interval History: Dean Harris returns today overall doing fairly well. He still is active as he isn't ever been. He denies any significant symptoms. He works out and does lots of yard work activity. Occasionally he will stop and take a break if he overdoes it. Because he is a little short of breath. He denies any chest discomfort.  Although he continues to have some memory issues and is a relatively poor historian, he is able to get his point across with assistance of his wife. She does tend to beta blocker little bit but he is able to answer questions. Cardiac review of symptoms as follows No PND, orthopnea or edema.  No palpitations, lightheadedness, dizziness, weakness or syncope/near syncope. No TIA/amaurosis fugax symptoms. No claudication.  ROS: A comprehensive was performed. Pertinent  positives and negatives noted above Review of Systems  Constitutional: Negative for malaise/fatigue and weight loss.  HENT: Negative for congestion and nosebleeds.   Respiratory: Negative for cough, shortness of breath and wheezing.   Gastrointestinal: Negative for blood in stool, constipation, heartburn and melena.  Genitourinary: Negative for hematuria.  Musculoskeletal: Negative for falls and joint pain.  Neurological: Positive for dizziness (Sometimes with position change).  Endo/Heme/Allergies: Positive for environmental allergies.  Psychiatric/Behavioral: Positive for memory loss. Negative for depression.  All other systems reviewed and are negative.  I have reviewed and (if needed) personally updated the patient's problem list, medications, allergies, past medical and surgical history, social and family history.   Past Medical History:  Diagnosis Date  . Chronic stable angina (HCC)   . 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 Depression   . Diabetes mellitus   . Essential hypertension   . Hyperlipidemia with target LDL less than 70   . 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 Pneumonia    x2  . PTSD (post-traumatic stress disorder) - with depression symptoms    [facility] by [facility]. This has helped his memory issues.  . Thrombocytopenia (HCC) 04/29/2014  . Vitamin D deficiency     Past Surgical History:  Procedure Laterality Date  . CAROTID ENDARTERECTOMY    . LEFT HEART CATHETERIZATION WITH CORONARY ANGIOGRAM  N/A 02/13/2012   Procedure: LEFT HEART CATHETERIZATION WITH CORONARY ANGIOGRAM;  Surgeon: Donato Schultz, MD;  Location: [facility] CATH LAB;  Service: Cardiovascular: Diffuse mid LAD 50%, 50%pOM1, 50%mRCA -> medical management  . TRANSTHORACIC ECHOCARDIOGRAM  01/2012   EF 55-60%.  Mild LVH. No RWMA, mild Aortic Stenosis (mean/peak gradient 13 mmHg/21 mmHg)  . TRANSTHORACIC ECHOCARDIOGRAM  02/07/2016   EF 60-65% with normal wall motion. Normal diastolic function for age. Moderate aortic stenosis (mean/peak gradient 24/35 mmHg)  . US CAROTID DOPPLER BILATERAL ([facility] HX) Bilateral 02/05/2016   Stable mild-to-moderate disease with bilateral heterogeneous plaque. RICA - 1-39%, LICA 40-59%. Bilateral subclavian and vertebral arteries normal.    Current Meds  Medication Sig  . albuterol-ipratropium (COMBIVENT) 18-103 MCG/ACT inhaler Inhale 1 puff into the lungs every 6 (six) hours as needed.  Marland Kitchen aspirin 325 MG tablet Take 325 mg by mouth daily.  Marland Kitchen b complex vitamins tablet Take 1 tablet by mouth daily.  . Cholecalciferol (VITAMIN D) 2000 UNITS tablet Take 2,000 Units by mouth daily.   Marland Kitchen donepezil (ARICEPT) 5 MG tablet Take 1 tablet (5 mg total) by mouth at bedtime.  . Fluticasone-Salmeterol (ADVAIR DISKUS) 100-50 MCG/DOSE AEPB Inhale 1 puff into the lungs every 12 (twelve) hours.  Marland Kitchen glucose blood (FREESTYLE LITE) test strip Test 1X per day and prn   Dx. E11.9  . isosorbide mononitrate (IMDUR) 30 MG 24 hr tablet Take 1 tablet (30 mg total) by mouth daily.  . Lancets (FREESTYLE) lancets Test 1x per day and prn  E11.9  . lisinopril (PRINIVIL,ZESTRIL) 10 MG tablet Take 1 tablet (10 mg total) by mouth daily.  . metFORMIN (GLUCOPHAGE) 500 MG tablet Take 0.5 tablets (250 mg total) by mouth 2 (two) times daily with a meal.  . nitroGLYCERIN (NITROSTAT) 0.4 MG SL tablet Place 63 tablets (25 mg total) under the tongue every 5 (five) minutes as needed for chest pain.  Marland Kitchen sertraline (ZOLOFT) 100 MG tablet Take 1 tablet (100 mg total) by mouth daily.  . travoprost, benzalkonium, (TRAVATAN) 0.004 % ophthalmic solution 1 drop at bedtime.    Allergies  Allergen Reactions  . Dust Mite Extract   . Mold Extract [Trichophyton]   . Pollen Extract   . Tetracyclines & Related     Generic only     Social History   Social History  . Marital status: Married    Spouse name: N/A  . Number of children: 1  . Years of education: 35   Occupational History  . N/A    Social History Main Topics  . Smoking status: Former Smoker    Types: Cigarettes    Quit date: 02/10/1988  . Smokeless tobacco: Never Used  . Alcohol use No  . Drug use: No  . Sexual activity: Not Asked     Comment: Married   Other Topics Concern  . None   Social History Narrative   Lives at home w/ her husband   Right-handed   Caffeine: 5-6 cups per day    family history includes Cancer in his father and other; Diabetes in his mother; Hyperlipidemia in his mother; Hypertension in his brother and mother; Stroke in his mother.  Wt Readings from Last 3 Encounters:  01/20/17 155 lb (70.3 kg)  12/16/16 158 lb (71.7 kg)  11/12/16 161 lb (73 kg)    PHYSICAL EXAM BP 130/64   Pulse 64   Ht 5\' 3"  (1.6 m)   Wt 155 lb (70.3 kg)   BMI 27.46 kg/m  General appearance: alert, cooperative, appears stated age, no distress and Well-nourished, well-groomed. Neck: no adenopathy, no carotid bruit and no JVD Lungs: clear to auscultation bilaterally, normal percussion bilaterally and non-labored Heart: regular rate and rhythm, S1&S2 normal, no click, rub or gallop; nondisplaced PMI. Harsh 3/6 c-d mid peaking SEM heard loudest at the RUSB but Abdomen: soft, non-tender; bowel sounds normal; no masses; no HSM or HJR Extremities: extremities normal, atraumatic, no cyanosis, and edema -  Pulses: 2+ and symmetric;  Skin: mobility and turgor normal and no evidence of bleeding or bruising Neurologic: Mental status: Alert, oriented x 3.  Slow thought processes, but able to answer questions properly. Pleasant mood and affect. Slow but steady gait. Otherwise nonfocal.   Adult ECG Report None  Other studies Reviewed: Additional studies/ records that were reviewed today include:  Recent Labs:   Lab Results  Component Value  Date   CHOL 192 11/12/2016   HDL 47 11/12/2016   LDLCALC 121 (H) 11/12/2016   TRIG 121 11/12/2016   CHOLHDL 4.1 11/12/2016   Lab Results  Component Value Date   CREATININE 1.08 11/12/2016   BUN 14 11/12/2016   NA 140 11/12/2016   K 4.7 11/12/2016   CL 98 11/12/2016   CO2 24 11/12/2016   ASSESSMENT / PLAN: Problem List Items Addressed This Visit    Carotid artery plaque, bilateral; s/p Bilateral CEA (Chronic)    Has had stable disease by carotid Dopplers. He is due for follow-up now.      Relevant Orders   VAS US CAROTID   Chronic stable angina (HCC) (Chronic)    I'm not convinced that he has true ischemic/anginal pain as his had no macrovascular disease noted on cardiac catheterization. Potentially has microvascular disease. - He actually does not seem to be having any of the chest tightness of late. It is somewhat controlled with nitrates and therefore he is doing well on current dose of Imdur. We will continue aspirin, need to confirm his dosage. He can probably be on 81 mg. He is not on a beta blocker because of pulmonary issues and borderline bradycardia. He is on stable dose of aspirin.  As long as a his symptoms remain stable, I would not do any further evaluation with stress testing etc.      Coronary artery disease, non-occlusive (Chronic)   Essential hypertension, benign (Chronic)    Well-controlled on ACE inhibitor.      High cholesterol (Chronic)    On statin. LDL is 121. With his memory issues I wanted careful with statin. He is borderline criteria for considering referral for PCSK9 inhibitor, however for now we will hold off.      Hyperlipidemia associated with type 2 diabetes mellitus (HCC) (Chronic)   Moderate aortic stenosis - Primary (Chronic)    He definitely has a heart murmur on exam. Remains moderate high echo. Plans to follow-up echo this year. If stable, we may consider every other year testing. No symptoms yet at this level of stenosis.       Relevant Orders   ECHOCARDIOGRAM COMPLETE      Current medicines are reviewed at length with the patient today. (+/- concerns) None The following changes have been made: None  Patient Instructions  SCHEDULE  AT [address] Your physician has requested that you have a carotid duplex. This test is an ultrasound of the carotid arteries in your neck. It looks at blood flow through these arteries that supply the brain with blood.  Allow one hour for this exam. There are no restrictions or special instructions.    SCHEDULE AT [address] Your physician has requested that you have an echocardiogram. Echocardiography is a painless test that uses sound waves to create images of your heart. It provides your doctor with information about the size and shape of your heart and how well your heart's chambers and valves are working. This procedure takes approximately one hour. There are no restrictions for this procedure.   Your physician wants you to follow-up in6 MONTHS WITH DR Burnard Enis.  You will receive a reminder letter in the mail two months in advance. If you don't receive a letter, please call our office to schedule the follow-up appointment.   If you need a refill on your cardiac medications before your next appointment, please call your pharmacy.    Studies Ordered:   Orders Placed This Encounter  Procedures  . ECHOCARDIOGRAM COMPLETE      Bryan Lemma, M.D., M.S. Interventional Cardiologist   Pager # [phone] Phone # [phone] [address] [place], [place] [address]

## 2017-01-20 NOTE — Patient Instructions (Signed)
SCHEDULE  AT [address] Your physician has requested that you have a carotid duplex. This test is an ultrasound of the carotid arteries in your neck. It looks at blood flow through these arteries that supply the brain with blood. Allow one hour for this exam. There are no restrictions or special instructions.    SCHEDULE AT [address] Your physician has requested that you have an echocardiogram. Echocardiography is a painless test that uses sound waves to create images of your heart. It provides your doctor with information about the size and shape of your heart and how well your heart's chambers and valves are working. This procedure takes approximately one hour. There are no restrictions for this procedure.   Your physician wants you to follow-up in6 MONTHS WITH DR HARDING.  You will receive a reminder letter in the mail two months in advance. If you don't receive a letter, please call our office to schedule the follow-up appointment.   If you need a refill on your cardiac medications before your next appointment, please call your pharmacy.

## 2017-01-22 ENCOUNTER — Encounter: Payer: Self-pay | Admitting: Cardiology

## 2017-01-23 ENCOUNTER — Ambulatory Visit (HOSPITAL_COMMUNITY)
Admission: RE | Admit: 2017-01-23 | Discharge: 2017-01-23 | Disposition: A | Discharge status: Home / Self Care | Payer: Medicare Other | Source: Ambulatory Visit | Attending: Cardiovascular Disease | Admitting: Cardiovascular Disease

## 2017-01-23 DIAGNOSIS — I6523 Occlusion and stenosis of bilateral carotid arteries: Secondary | ICD-10-CM | POA: Insufficient documentation

## 2017-01-23 NOTE — Assessment & Plan Note (Signed)
He definitely has a heart murmur on exam. Remains moderate high echo. Plans to follow-up echo this year. If stable, we may consider every other year testing. No symptoms yet at this level of stenosis.

## 2017-01-23 NOTE — Assessment & Plan Note (Signed)
On statin. LDL is 121. With his memory issues I wanted careful with statin. He is borderline criteria for considering referral for PCSK9 inhibitor, however for now we will hold off.

## 2017-01-23 NOTE — Assessment & Plan Note (Signed)
Well-controlled on ACE inhibitor. 

## 2017-01-23 NOTE — Assessment & Plan Note (Signed)
Has had stable disease by carotid Dopplers. He is due for follow-up now.

## 2017-01-23 NOTE — Assessment & Plan Note (Addendum)
I'm not convinced that he has true ischemic/anginal pain as his had no macrovascular disease noted on cardiac catheterization. Potentially has microvascular disease. - He actually does not seem to be having any of the chest tightness of late. It is somewhat controlled with nitrates and therefore he is doing well on current dose of Imdur. We will continue aspirin, need to confirm his dosage. He can probably be on 81 mg. He is not on a beta blocker because of pulmonary issues and borderline bradycardia. He is on stable dose of aspirin.  As long as a his symptoms remain stable, I would not do any further evaluation with stress testing etc.

## 2017-02-02 ENCOUNTER — Other Ambulatory Visit: Payer: Self-pay | Admitting: Neurology

## 2017-02-02 ENCOUNTER — Telehealth: Payer: Self-pay | Admitting: *Deleted

## 2017-02-02 DIAGNOSIS — I6523 Occlusion and stenosis of bilateral carotid arteries: Secondary | ICD-10-CM

## 2017-02-02 NOTE — Telephone Encounter (Signed)
Sabrina from [facility] said a new prescription is needed for the 10 mg of the donepezil (ARICEPT) 5 MG tablet

## 2017-02-02 NOTE — Telephone Encounter (Signed)
Spoke to patient's wife. Result given . Verbalized understanding Order placed for 2 year follow up

## 2017-02-02 NOTE — Telephone Encounter (Signed)
-----   Message from Marykay Lexavid W Harding, MD sent at 02/01/2017 11:38 PM EDT ----- STABLE CAROTID DOPPLERS - Mild disease only.    Heterogeneous plaque, bilaterally.  Stable 1-39% RICA stenosis.  Stable high end 1-39% LICA stenosis.  Normal subclavian arteries, bilaterally.  Patent vertebral arteries with antegrade flow.  F/u 2 year  Bryan LemmaDavid Harding c

## 2017-02-04 ENCOUNTER — Other Ambulatory Visit: Payer: Self-pay | Admitting: Neurology

## 2017-02-04 ENCOUNTER — Other Ambulatory Visit: Payer: Self-pay

## 2017-02-04 ENCOUNTER — Ambulatory Visit (HOSPITAL_COMMUNITY): Payer: Medicare Other | Attending: Cardiovascular Disease

## 2017-02-04 DIAGNOSIS — I350 Nonrheumatic aortic (valve) stenosis: Secondary | ICD-10-CM | POA: Diagnosis not present

## 2017-02-04 MED ORDER — DONEPEZIL HCL 10 MG PO TABS: 10.0000 mg | ORAL_TABLET | Freq: Every day | ORAL | 3 refills | Status: DC

## 2017-02-12 ENCOUNTER — Telehealth: Payer: Self-pay | Admitting: *Deleted

## 2017-02-12 NOTE — Telephone Encounter (Signed)
Left message to call back  

## 2017-02-12 NOTE — Telephone Encounter (Signed)
-----   Message from Marykay Lexavid W Harding, MD sent at 02/11/2017 10:12 PM EDT ----- Essentially stable echocardiogram. Continue to have moderate aortic stenosis. Left ventricle is hoping well. There is some relaxation abnormality which could explain some shortness of breath with exertion. This is treated with blood pressure control. - We can probably wait 2 years to recheck.  Bryan Lemmaavid Harding, MD

## 2017-02-20 ENCOUNTER — Encounter: Payer: Self-pay | Admitting: Nurse Practitioner

## 2017-02-20 ENCOUNTER — Ambulatory Visit (INDEPENDENT_AMBULATORY_CARE_PROVIDER_SITE_OTHER): Payer: Medicare Other | Admitting: Nurse Practitioner

## 2017-02-20 VITALS — BP 118/63 | HR 66 | Temp 97.1°F | Ht 63.0 in | Wt 152.4 lb

## 2017-02-20 DIAGNOSIS — E1169 Type 2 diabetes mellitus with other specified complication: Secondary | ICD-10-CM | POA: Diagnosis not present

## 2017-02-20 DIAGNOSIS — E785 Hyperlipidemia, unspecified: Secondary | ICD-10-CM | POA: Diagnosis not present

## 2017-02-20 DIAGNOSIS — E1142 Type 2 diabetes mellitus with diabetic polyneuropathy: Secondary | ICD-10-CM

## 2017-02-20 DIAGNOSIS — R413 Other amnesia: Secondary | ICD-10-CM

## 2017-02-20 DIAGNOSIS — G588 Other specified mononeuropathies: Secondary | ICD-10-CM

## 2017-02-20 DIAGNOSIS — Z6830 Body mass index (BMI) 30.0-30.9, adult: Secondary | ICD-10-CM | POA: Diagnosis not present

## 2017-02-20 DIAGNOSIS — I2510 Atherosclerotic heart disease of native coronary artery without angina pectoris: Secondary | ICD-10-CM | POA: Diagnosis not present

## 2017-02-20 DIAGNOSIS — M1009 Idiopathic gout, multiple sites: Secondary | ICD-10-CM | POA: Diagnosis not present

## 2017-02-20 DIAGNOSIS — I10 Essential (primary) hypertension: Secondary | ICD-10-CM

## 2017-02-20 DIAGNOSIS — G4730 Sleep apnea, unspecified: Secondary | ICD-10-CM | POA: Diagnosis not present

## 2017-02-20 DIAGNOSIS — D696 Thrombocytopenia, unspecified: Secondary | ICD-10-CM

## 2017-02-20 DIAGNOSIS — J42 Unspecified chronic bronchitis: Secondary | ICD-10-CM

## 2017-02-20 DIAGNOSIS — F339 Major depressive disorder, recurrent, unspecified: Secondary | ICD-10-CM

## 2017-02-20 LAB — BAYER DCA HB A1C WAIVED: HB A1C: 5.7 % (ref <7.0)

## 2017-02-20 MED ORDER — DONEPEZIL HCL 10 MG PO TABS: 10.0000 mg | ORAL_TABLET | Freq: Every day | ORAL | 3 refills | Status: DC

## 2017-02-20 MED ORDER — IPRATROPIUM-ALBUTEROL 20-100 MCG/ACT IN AERS: 1.0000 | INHALATION_SPRAY | Freq: Four times a day (QID) | RESPIRATORY_TRACT | 5 refills | Status: DC

## 2017-02-20 MED ORDER — METFORMIN HCL 500 MG PO TABS: 250.0000 mg | ORAL_TABLET | Freq: Two times a day (BID) | ORAL | 5 refills | Status: DC

## 2017-02-20 MED ORDER — FLUTICASONE-SALMETEROL 100-50 MCG/DOSE IN AEPB: 1.0000 | INHALATION_SPRAY | Freq: Two times a day (BID) | RESPIRATORY_TRACT | 5 refills | Status: DC

## 2017-02-20 MED ORDER — SERTRALINE HCL 100 MG PO TABS: 100.0000 mg | ORAL_TABLET | Freq: Every day | ORAL | 1 refills | Status: DC

## 2017-02-20 MED ORDER — LISINOPRIL 10 MG PO TABS: 10.0000 mg | ORAL_TABLET | Freq: Every day | ORAL | 5 refills | Status: DC

## 2017-02-20 MED ORDER — ISOSORBIDE MONONITRATE ER 30 MG PO TB24: 30.0000 mg | ORAL_TABLET | Freq: Every day | ORAL | 5 refills | Status: DC

## 2017-02-20 NOTE — Patient Instructions (Signed)

## 2017-02-20 NOTE — Progress Notes (Addendum)
Subjective:    Patient ID: Dean Harris, male    DOB: 1941-02-12, 76 y.o.   MRN: [id number]  HPI   Dean Harris is here today for follow up of chronic medical problem.  Outpatient Encounter Prescriptions as of 02/20/2017  Medication Sig  . albuterol-ipratropium (COMBIVENT) 18-103 MCG/ACT inhaler Inhale 1 puff into the lungs every 6 (six) hours as needed.  Marland Kitchen aspirin 325 MG tablet Take 325 mg by mouth daily.  Marland Kitchen b complex vitamins tablet Take 1 tablet by mouth daily.  . Cholecalciferol (VITAMIN D) 2000 UNITS tablet Take 2,000 Units by mouth daily.   Marland Kitchen donepezil (ARICEPT) 10 MG tablet Take 1 tablet (10 mg total) by mouth at bedtime.  . Fluticasone-Salmeterol (ADVAIR DISKUS) 100-50 MCG/DOSE AEPB Inhale 1 puff into the lungs every 12 (twelve) hours.  Marland Kitchen glucose blood (FREESTYLE LITE) test strip Test 1X per day and prn   Dx. E11.9  . isosorbide mononitrate (IMDUR) 30 MG 24 hr tablet Take 1 tablet (30 mg total) by mouth daily.  . Lancets (FREESTYLE) lancets Test 1x per day and prn  E11.9  . lisinopril (PRINIVIL,ZESTRIL) 10 MG tablet Take 1 tablet (10 mg total) by mouth daily.  . metFORMIN (GLUCOPHAGE) 500 MG tablet Take 0.5 tablets (250 mg total) by mouth 2 (two) times daily with a meal.  . nitroGLYCERIN (NITROSTAT) 0.4 MG SL tablet Place 63 tablets (25 mg total) under the tongue every 5 (five) minutes as needed for chest pain.  Marland Kitchen sertraline (ZOLOFT) 100 MG tablet Take 1 tablet (100 mg total) by mouth daily.  . travoprost, benzalkonium, (TRAVATAN) 0.004 % ophthalmic solution 1 drop at bedtime.   No facility-administered encounter medications on file as of 02/20/2017.     1. Type 2 diabetes mellitus with diabetic polyneuropathy, without long-term current use of insulin ([facility]) last hgba1c was 6.7%- blood sugars have been averaging around 120 fasting  2. Hyperlipidemia, unspecified hyperlipidemia type  Tries to watch diet- wife tries to cook low fat  3. Essential hypertension, benign  No c/o  chest pain,sob or ha- does not check blood pressure at home  4. Coronary artery disease, non-occlusive  Sees cardiologist every 6 months  5. Sleep apnea, unspecified type  Wears cpap nightly  6. Chronic bronchitis, unspecified chronic bronchitis type ([place])  On advair which helps with breathing  7. Hyperlipidemia associated with type 2 diabetes mellitus ([place])   8. 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 Thrombocytopenia ([place])  Needs blood work today  10. Memory disorder  Wife says he is doing well- has not worsened- is on aricept daiy  11. Idiopathic gout of multiple sites, unspecified chronicity   no recent flare ups  12. BMI 30.0-30.9,adult   no recent weight change    New complaints: None today    Review of Systems  Constitutional: Negative for activity change and appetite change.  HENT: Negative.   Eyes: Negative for pain.  Respiratory: Negative for shortness of breath.   Cardiovascular: Negative for chest pain, palpitations and leg swelling.  Gastrointestinal: Negative for abdominal pain.  Endocrine: Negative for polydipsia.  Genitourinary: Negative.   Musculoskeletal: Negative.   Skin: Negative for rash.  Neurological: Negative for dizziness, weakness and headaches.  Hematological: Does not bruise/bleed easily.  Psychiatric/Behavioral: Negative.   All other systems reviewed and are negative.      Objective:   Physical Exam  Constitutional: He is oriented to person, place, and time. He appears well-developed and well-nourished.  HENT:  Head: Normocephalic.  Right Ear: External ear normal.  Left Ear: External ear normal.  Nose: Nose normal.  Mouth/Throat: Oropharynx is clear and moist.  Eyes: EOM are normal. Pupils are equal, round, and reactive to light.  Neck: Normal range of motion. Neck supple. No JVD present. No thyromegaly present.  Cardiovascular: Normal rate, regular rhythm and intact distal pulses.  Exam reveals no gallop and no friction rub.     Murmur (3/6 systolic murmur) heard. Pulmonary/Chest: Effort normal and breath sounds normal. No respiratory distress. He has no wheezes. He has no rales. He exhibits no tenderness.  Abdominal: Soft. Bowel sounds are normal. He exhibits no mass. There is no tenderness.  Musculoskeletal: Normal range of motion. He exhibits no edema.  Lymphadenopathy:    He has no cervical adenopathy.  Neurological: He is alert and oriented to person, place, and time. No cranial nerve deficit.  Skin: Skin is warm and dry.  Psychiatric: He has a normal mood and affect. His behavior is normal. Judgment and thought content normal.   BP 118/63   Pulse 66   Temp 97.1 F (36.2 C) (Oral)   Ht _0  (1.6 m)   Wt 152 lb 6.4 oz (69.1 kg)   BMI 27.00 kg/m   HGBA1c 5.7%       Assessment & Plan:  1. Type 2 diabetes mellitus with diabetic polyneuropathy, without long-term current use of insulin (HCC) Continue to watch carbsin diet - Bayer DCA Hb A1c Waived - metFORMIN (GLUCOPHAGE) 500 MG tablet; Take 0.5 tablets (250 mg total) by mouth 2 (two) times daily with a meal.  Dispense: 60 tablet; Refill: 5  2. Hyperlipidemia, unspecified hyperlipidemia type Low fat diet - Lipid panel  3. Essential hypertension, benign Low sodium - CMP14+EGFR - lisinopril (PRINIVIL,ZESTRIL) 10 MG tablet; Take 1 tablet (10 mg total) by mouth daily.  Dispense: 30 tablet; Refill: 5  4. Coronary artery disease, non-occlusive Keep follow up with cardiology - isosorbide mononitrate (IMDUR) 30 MG 24 hr tablet; Take 1 tablet (30 mg total) by mouth daily.  Dispense: 30 tablet; Refill: 5  5. Sleep apnea, unspecified type Continue CPAP nightly  6. Chronic bronchitis, unspecified chronic bronchitis type (HCC) - Fluticasone-Salmeterol (ADVAIR DISKUS) 100-50 MCG/DOSE AEPB; Inhale 1 puff into the lungs every 12 (twelve) hours.  Dispense: 60 each; Refill: 5 - Ipratropium-Albuterol (COMBIVENT) 20-100 MCG/ACT AERS respimat; Inhale 1 puff into  the lungs every 6 (six) hours.  Dispense: 4 g; Refill: 5  7. Hyperlipidemia associated with type 2 diabetes mellitus ([place])  8. Other mononeuropathy Do not go barefooted Continue to let wife keep check on feet for sores or lesions  9. Thrombocytopenia ([place]) Labs pending - Anemia Profile B  10. Memory disorder Continue to orient daily Memory games on computer will help - donepezil (ARICEPT) 10 MG tablet; Take 1 tablet (10 mg total) by mouth at bedtime.  Dispense: 90 tablet; Refill: 3  11. Idiopathic gout of multiple sites, unspecified chronicity  12. BMI 30.0-30.9,adult Discussed diet and exercise for person with BMI >25 Will recheck weight in 3-6 months  13. Depression, recurrent ([place]) Stress management - sertraline (ZOLOFT) 100 MG tablet; Take 1 tablet (100 mg total) by mouth daily.  Dispense: 90 tablet; Refill: 1    Labs pending Health maintenance reviewed Diet and exercise encouraged Continue all meds Follow up  In 3 months   [place], FNP

## 2017-02-21 LAB — CMP14+EGFR
ALK PHOS: 55 IU/L (ref 39–117)
ALT: 17 IU/L (ref 0–44)
AST: 19 IU/L (ref 0–40)
Albumin/Globulin Ratio: 2.2 (ref 1.2–2.2)
Albumin: 4.3 g/dL (ref 3.5–4.8)
BUN/Creatinine Ratio: 18 (ref 10–24)
BUN: 22 mg/dL (ref 8–27)
Bilirubin Total: 1 mg/dL (ref 0.0–1.2)
CO2: 23 mmol/L (ref 18–29)
CREATININE: 1.23 mg/dL (ref 0.76–1.27)
Calcium: 9 mg/dL (ref 8.6–10.2)
Chloride: 101 mmol/L (ref 96–106)
GFR calc Af Amer: 67 mL/min/{1.73_m2} (ref >59)
GFR calc non Af Amer: 58 mL/min/{1.73_m2} — ABNORMAL LOW (ref >59)
GLOBULIN, TOTAL: 2 g/dL (ref 1.5–4.5)
Glucose: 115 mg/dL — ABNORMAL HIGH (ref 65–99)
Potassium: 4.7 mmol/L (ref 3.5–5.2)
SODIUM: 137 mmol/L (ref 134–144)
Total Protein: 6.3 g/dL (ref 6.0–8.5)

## 2017-02-21 LAB — ANEMIA PROFILE B
BASOS ABS: 0.1 10*3/uL (ref 0.0–0.2)
Basos: 1 %
EOS (ABSOLUTE): 0.2 10*3/uL (ref 0.0–0.4)
Eos: 5 %
Ferritin: 110 ng/mL (ref 30–400)
Folate: >20 ng/mL (ref >3.0)
Hematocrit: 40.3 % (ref 37.5–51.0)
Hemoglobin: 13.7 g/dL (ref 13.0–17.7)
IMMATURE GRANS (ABS): 0 10*3/uL (ref 0.0–0.1)
IMMATURE GRANULOCYTES: 0 %
IRON: 72 ug/dL (ref 38–169)
Iron Saturation: 23 % (ref 15–55)
Lymphocytes Absolute: 2 10*3/uL (ref 0.7–3.1)
Lymphs: 40 %
MCH: 29.4 pg (ref 26.6–33.0)
MCHC: 34 g/dL (ref 31.5–35.7)
MCV: 87 fL (ref 79–97)
MONOCYTES: 7 %
Monocytes Absolute: 0.3 10*3/uL (ref 0.1–0.9)
Neutrophils Absolute: 2.4 10*3/uL (ref 1.4–7.0)
Neutrophils: 47 %
PLATELETS: 143 10*3/uL — AB (ref 150–379)
RBC: 4.66 x10E6/uL (ref 4.14–5.80)
RDW: 14.2 % (ref 12.3–15.4)
RETIC CT PCT: 0.9 % (ref 0.6–2.6)
TIBC: 314 ug/dL (ref 250–450)
UIBC: 242 ug/dL (ref 111–343)
Vitamin B-12: 824 pg/mL (ref 232–1245)
WBC: 5 10*3/uL (ref 3.4–10.8)

## 2017-02-21 LAB — LIPID PANEL
CHOL/HDL RATIO: 3.7 ratio (ref 0.0–5.0)
Cholesterol, Total: 166 mg/dL (ref 100–199)
HDL: 45 mg/dL (ref >39)
LDL CALC: 98 mg/dL (ref 0–99)
TRIGLYCERIDES: 115 mg/dL (ref 0–149)
VLDL Cholesterol Cal: 23 mg/dL (ref 5–40)

## 2017-03-30 ENCOUNTER — Ambulatory Visit: Payer: Medicare Other | Admitting: Adult Health

## 2017-03-30 ENCOUNTER — Encounter: Payer: Self-pay | Admitting: Adult Health

## 2017-03-30 ENCOUNTER — Ambulatory Visit (INDEPENDENT_AMBULATORY_CARE_PROVIDER_SITE_OTHER): Payer: Medicare Other | Admitting: Adult Health

## 2017-03-30 DIAGNOSIS — R413 Other amnesia: Secondary | ICD-10-CM | POA: Diagnosis not present

## 2017-03-30 MED ORDER — MEMANTINE HCL 28 X 5 MG & 21 X 10 MG PO TABS: ORAL_TABLET | ORAL | 0 refills | Status: DC

## 2017-03-30 MED ORDER — DONEPEZIL HCL 10 MG PO TABS
5.0000 mg | ORAL_TABLET | Freq: Every day | ORAL | 3 refills | Status: DC
Start: 2017-03-30 — End: 2017-05-26

## 2017-03-30 NOTE — Patient Instructions (Signed)
Your Plan:  Decrease Aricept to 5 mg at bedtime Start Namenda titration pack- at the beginning of week 4 if tolerating the medication well please call for new prescription.   Thank you for coming to see Dean Harris at [facility]. I hope we have been able to provide you high quality care today.  You may receive a patient satisfaction survey over the next few weeks. We would appreciate your feedback and comments so that we may continue to improve ourselves and the health of our patients.  Memantine Tablets What is this medicine? MEMANTINE (MEM an teen) is used to treat dementia caused by Alzheimer's disease. This medicine may be used for other purposes; ask your health care provider or pharmacist if you have questions. COMMON BRAND NAME(S): Namenda What should I tell my health care provider before I take this medicine? They need to know if you have any of these conditions: -difficulty passing urine -kidney disease -liver disease -seizures -an unusual or allergic reaction to memantine, other medicines, foods, dyes, or preservatives -pregnant or trying to get pregnant -breast-feeding How should I use this medicine? Take this medicine by mouth with a glass of water. Follow the directions on the prescription label. You may take this medicine with or without food. Take your doses at regular intervals. Do not take your medicine more often than directed. Continue to take your medicine even if you feel better. Do not stop taking except on the advice of your doctor or health care professional. Talk to your pediatrician regarding the use of this medicine in children. Special care may be needed. Overdosage: If you think you have taken too much of this medicine contact a poison control center or emergency room at once. NOTE: This medicine is only for you. Do not share this medicine with others. What if I miss a dose? If you miss a dose, take it as soon as you can. If it is almost time for  your next dose, take only that dose. Do not take double or extra doses. If you do not take your medicine for several days, contact your health care provider. Your dose may need to be changed. What may interact with this medicine? -acetazolamide -amantadine -cimetidine -dextromethorphan -dofetilide -hydrochlorothiazide -ketamine -metformin -methazolamide -quinidine -ranitidine -sodium bicarbonate -triamterene This list may not describe all possible interactions. Give your health care provider a list of all the medicines, herbs, non-prescription drugs, or dietary supplements you use. Also tell them if you smoke, drink alcohol, or use illegal drugs. Some items may interact with your medicine. What should I watch for while using this medicine? Visit your doctor or health care professional for regular checks on your progress. Check with your doctor or health care professional if there is no improvement in your symptoms or if they get worse. You may get drowsy or dizzy. Do not drive, use machinery, or do anything that needs mental alertness until you know how this drug affects you. Do not stand or sit up quickly, especially if you are an older patient. This reduces the risk of dizzy or fainting spells. Alcohol can make you more drowsy and dizzy. Avoid alcoholic drinks. What side effects may I notice from receiving this medicine? Side effects that you should report to your doctor or health care professional as soon as possible: -allergic reactions like skin rash, itching or hives, swelling of the face, lips, or tongue -agitation or a feeling of restlessness -depressed mood -dizziness -hallucinations -redness, blistering, peeling or loosening of the skin,  including inside the mouth -seizures -vomiting Side effects that usually do not require medical attention (report to your doctor or health care professional if they continue or are  bothersome): -constipation -diarrhea -headache -nausea -trouble sleeping This list may not describe all possible side effects. Call your doctor for medical advice about side effects. You may report side effects to FDA at 1-800-FDA-1088. Where should I keep my medicine? Keep out of the reach of children. Store at room temperature between 15 degrees and 30 degrees C (59 degrees and 86 degrees F). Throw away any unused medicine after the expiration date. NOTE: This sheet is a summary. It may not cover all possible information. If you have questions about this medicine, talk to your doctor, pharmacist, or health care provider.  2018 Elsevier/Gold Standard (2013-06-20 14:10:42)

## 2017-03-30 NOTE — Progress Notes (Signed)
PATIENT: Dean Harris DOB: 1941/04/13  REASON FOR VISIT: follow up- memory disturbance HISTORY FROM: patient  HISTORY OF PRESENT ILLNESS: Dean Harris is a 76 year old male with a history of memory disturbance. He returns today for follow-up. He is currently on Aricept. The wife has noticed that he has about one episode of diarrhea a week. She states that when this occurs it's urgent and he may  have an accident. He is able to complete all ADLs independently. 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 For longer distances his wife typically drives. He denies any trouble sleeping other than he gets up 3 or 4 times to urinate. He is currently on Zoloft for his depression. He does see a psychiatrist. He returns today for an evaluation.  HISTORY 09/30/16: Dean Harris is a 76 year old right-handed white male with a history of a progressive memory disturbance that began at least 6 months ago. The patient has had primary issues with language, he is having a lot of word finding problems and hesitation with speech. He does have some short-term memory issues as well, he will not remember a recent conversation. He may repeat himself. He is not reading as much, he has difficulty understanding "big words". The patient is operating a motor vehicle, but he is no longer driving much past his own driveway to get the mail. The patient's wife does the finances, she always has. The patient requires assistance with keeping up with medications and appointments. The patient is snoring at night, he has a fairly good energy level during the day, he will occasionally take a nap. The patient has diabetes, he reports some numbness in the feet, he has not had any significant issues with balance or any falls. He denies problems controlling the bowels or the bladder. The wife indicates that in 2016 he fell and hit his head without loss of consciousness. He never sought medical attention following the fall. The patient  does have a maternal aunt at age 50 who has some memory issues. Otherwise, there is no significant family history of memory disturbances.  REVIEW OF SYSTEMS: Out of a complete 14 system review of symptoms, the patient complains only of the following symptoms, and all other reviewed systems are negative.  Back pain, aching muscles, muscle cramps, moles, itching, headache, memory loss, environmental allergies, frequency of urination, urgency, diarrhea, restless leg, daytime sleepiness, murmur, cough, hearing loss, runny nose  ALLERGIES: Allergies  Allergen Reactions  . Dust Mite Extract   . Mold Extract [Trichophyton]   . Pollen Extract   . Tetracyclines & Related     Generic only    HOME MEDICATIONS: Outpatient Medications Prior to Visit  Medication Sig Dispense Refill  . aspirin 325 MG tablet Take 325 mg by mouth daily.    Marland Kitchen b complex vitamins tablet Take 1 tablet by mouth daily.    . Cholecalciferol (VITAMIN D) 2000 UNITS tablet Take 2,000 Units by mouth daily.     Marland Kitchen donepezil (ARICEPT) 10 MG tablet Take 1 tablet (10 mg total) by mouth at bedtime. 90 tablet 3  . Fluticasone-Salmeterol (ADVAIR DISKUS) 100-50 MCG/DOSE AEPB Inhale 1 puff into the lungs every 12 (twelve) hours. 60 each 5  . glucose blood (FREESTYLE LITE) test strip Test 1X per day and prn   Dx. E11.9 100 each 12  . Ipratropium-Albuterol (COMBIVENT) 20-100 MCG/ACT AERS respimat Inhale 1 puff into the lungs every 6 (six) hours. 4 g 5  . isosorbide mononitrate (  IMDUR) 30 MG 24 hr tablet Take 1 tablet (30 mg total) by mouth daily. 30 tablet 5  . Lancets (FREESTYLE) lancets Test 1x per day and prn  E11.9 100 each 12  . lisinopril (PRINIVIL,ZESTRIL) 10 MG tablet Take 1 tablet (10 mg total) by mouth daily. 30 tablet 5  . metFORMIN (GLUCOPHAGE) 500 MG tablet Take 0.5 tablets (250 mg total) by mouth 2 (two) times daily with a meal. 60 tablet 5  . nitroGLYCERIN (NITROSTAT) 0.4 MG SL tablet Place 63 tablets (25 mg total) under the  tongue every 5 (five) minutes as needed for chest pain. 25 tablet 5  . sertraline (ZOLOFT) 100 MG tablet Take 1 tablet (100 mg total) by mouth daily. 90 tablet 1  . travoprost, benzalkonium, (TRAVATAN) 0.004 % ophthalmic solution 1 drop at bedtime.     No facility-administered medications prior to visit.     PAST MEDICAL HISTORY: Past Medical History:  Diagnosis Date  . Chronic stable angina (HCC)   . 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 Depression   . Diabetes mellitus   . Essential hypertension   . Hyperlipidemia with target LDL less than 70   . 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 Pneumonia    x2  . PTSD (post-traumatic stress disorder) - with depression symptoms    Recently started on medication by the [place]. This has helped his memory issues.  . Thrombocytopenia (HCC) 04/29/2014  . Vitamin D deficiency     PAST SURGICAL HISTORY: Past Surgical History:  Procedure Laterality Date  . CAROTID ENDARTERECTOMY    . LEFT HEART CATHETERIZATION WITH CORONARY ANGIOGRAM N/A 02/13/2012   Procedure: LEFT HEART CATHETERIZATION WITH CORONARY ANGIOGRAM;  Surgeon: Donato Schultz, MD;  Location: [facility] CATH LAB;  Service: Cardiovascular: Diffuse mid LAD 50%, 50%pOM1, 50%mRCA -> medical management  . TRANSTHORACIC ECHOCARDIOGRAM  01/2012   EF 55-60%. Mild LVH. No RWMA, mild Aortic Stenosis (mean/peak gradient 13 mmHg/21 mmHg)  . TRANSTHORACIC ECHOCARDIOGRAM  02/07/2016   EF 60-65% with normal wall motion. Normal diastolic function for age. Moderate aortic stenosis (mean/peak gradient 24/35 mmHg)  . US CAROTID DOPPLER BILATERAL ([facility] HX) Bilateral 02/05/2016   Stable mild-to-moderate disease with bilateral heterogeneous plaque. RICA - 1-39%, LICA 40-59%. Bilateral subclavian and vertebral arteries normal.    FAMILY  HISTORY: Family History  Problem Relation Age of Onset  . Diabetes Mother   . Stroke Mother   . Hypertension Mother   . Hyperlipidemia Mother   . Cancer Father   . Hypertension Brother   . Cancer Other     SOCIAL HISTORY: Social History   Social History  . Marital status: Married    Spouse name: Bonita Quin  . Number of children: 1  . Years of education: 31   Occupational History  . N/A    Social History Main Topics  . Smoking status: Former Smoker    Types: Cigarettes    Quit date: 02/10/1988  . Smokeless tobacco: Never Used  . Alcohol use No  . Drug use: No  . Sexual activity: Not on file     Comment: Married   Other Topics Concern  . Not on file   Social History Narrative   Lives at home w/ wife   Right-handed   Caffeine: 5-6 cups per day      PHYSICAL EXAM  Vitals:   03/30/17 1251  BP: (!) 110/52  Pulse: (!) 59  Weight: 151 lb (68.5 kg)   Body mass index is 26.75 kg/m. MMSE - Mini Mental State Exam 03/30/2017 09/30/2016  Orientation to time 3 2  Orientation to Place 3 3  Registration 3 3  Attention/ Calculation 1 4  Recall 1 2  Language- name 2 objects 2 2  Language- repeat 0 0  Language- follow 3 step command 3 3  Language- read & follow direction 1 1  Write a sentence 1 1  Copy design 1 0  Total score 19 21    Generalized: Well developed, in no acute distress   Neurological examination  Mentation: Alert. Follows all commands speech and language fluent Cranial nerve II-XII: Pupils were equal round reactive to light. Extraocular movements were full, visual field were full on confrontational test. Facial sensation and strength were normal. Uvula tongue midline. Head turning and shoulder shrug  were normal and symmetric. Motor: The motor testing reveals 5 over 5 strength of all 4 extremities. Good symmetric motor tone is noted throughout.  Sensory: Sensory testing is intact to soft touch on all 4 extremities. No evidence of extinction is noted.    Coordination: Cerebellar testing reveals good finger-nose-finger and heel-to-shin bilaterally.  Gait and station: Gait is normal. Tandem gait is Unsteady. Romberg is negative. No drift is seen.  Reflexes: Deep tendon reflexes are symmetric and normal bilaterally.   DIAGNOSTIC DATA (LABS, IMAGING, TESTING) - I reviewed patient records, labs, notes, testing and imaging myself where available.  Lab Results  Component Value Date   WBC 5.0 02/20/2017   HGB 13.7 02/20/2017   HCT 40.3 02/20/2017   MCV 87 02/20/2017   PLT 143 (L) 02/20/2017      Component Value Date/Time   NA 137 02/20/2017 1014   K 4.7 02/20/2017 1014   CL 101 02/20/2017 1014   CO2 23 02/20/2017 1014   GLUCOSE 115 (H) 02/20/2017 1014   GLUCOSE 146 (H) 01/17/2016 1342   BUN 22 02/20/2017 1014   CREATININE 1.23 02/20/2017 1014   CREATININE 1.14 02/09/2013 0954   CALCIUM 9.0 02/20/2017 1014   PROT 6.3 02/20/2017 1014   ALBUMIN 4.3 02/20/2017 1014   AST 19 02/20/2017 1014   ALT 17 02/20/2017 1014   ALKPHOS 55 02/20/2017 1014   BILITOT 1.0 02/20/2017 1014   GFRNONAA 58 (L) 02/20/2017 1014   GFRNONAA 65 02/09/2013 0954   GFRAA 67 02/20/2017 1014   GFRAA 75 02/09/2013 0954   Lab Results  Component Value Date   CHOL 166 02/20/2017   HDL 45 02/20/2017   LDLCALC 98 02/20/2017   TRIG 115 02/20/2017   CHOLHDL 3.7 02/20/2017   Lab Results  Component Value Date   HGBA1C 6.5 10/11/2015   Lab Results  Component Value Date   VITAMINB12 824 02/20/2017   Lab Results  Component Value Date   TSH 2.230 03/28/2014      ASSESSMENT AND PLAN 76 y.o. year old male  has a past medical history of Chronic stable angina (HCC); Coronary artery disease, non-occlusive (01/2012); Depression; Diabetes mellitus; Essential hypertension; Hyperlipidemia with target LDL less than 70; Moderate calcific aortic stenosis (01/2012); Pneumonia; PTSD (post-traumatic stress disorder) - with depression symptoms; Thrombocytopenia (HCC)  (04/29/2014); and Vitamin D deficiency. here with:  1. Memory disturbance  The patient is having diarrhea on Aricept. We'll decrease to 5 mg and to see if this subsides. His memory score has slightly decreased. He will begin  a Namenda titration pack. I have reviewed Namenda and side effects with the patient and his wife. I have advised that at the beginning of week foiur if he is tolerating the pack he should call for the maintenance dose. I have advised that if his symptoms worsen or he develops new symptoms he should let us know. He will follow-up in 6 months or sooner if needed.  I spent 15 minutes with the patient. 50% of this time was spent reviewing medication- Namenda     Butch Penny, MSN, NP-C 03/30/2017, 1:07 PM [facility] [address] [place], [place] [address] [phone]

## 2017-03-30 NOTE — Progress Notes (Signed)
I have read the note, and I agree with the clinical assessment and plan.  Dean Harris   

## 2017-04-21 ENCOUNTER — Telehealth: Payer: Self-pay | Admitting: Nurse Practitioner

## 2017-04-22 NOTE — Telephone Encounter (Signed)
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

## 2017-04-28 ENCOUNTER — Telehealth: Payer: Self-pay | Admitting: Adult Health

## 2017-04-28 MED ORDER — MEMANTINE HCL 10 MG PO TABS: 10.0000 mg | ORAL_TABLET | Freq: Two times a day (BID) | ORAL | 4 refills | Status: DC

## 2017-04-28 NOTE — Addendum Note (Signed)
Addended by: Guy BeginYOUNG, Will Schier S on: 04/28/2017 03:52 PM   Modules accepted: Orders

## 2017-04-28 NOTE — Telephone Encounter (Signed)
Pt wife calling to inform that other than some diarrhea pt is doing well, he is in week 3 of the medication.  Pt wife is asking for a call back for the next dosage

## 2017-04-28 NOTE — Telephone Encounter (Signed)
Pt has been taking the memantine 10mg  po bid this week, tolerating fine.  Taking the donezepil 5mg  po daily and has noted of only 2 episodes of diarrhea.  Wife wants refill for memantine 10mg  tabs.    Will call in prescription [facility] in [place].

## 2017-05-26 ENCOUNTER — Ambulatory Visit (INDEPENDENT_AMBULATORY_CARE_PROVIDER_SITE_OTHER): Payer: Medicare Other | Admitting: Nurse Practitioner

## 2017-05-26 ENCOUNTER — Encounter: Payer: Self-pay | Admitting: Nurse Practitioner

## 2017-05-26 VITALS — BP 118/66 | HR 64 | Temp 97.1°F | Ht 63.0 in | Wt 147.0 lb

## 2017-05-26 DIAGNOSIS — I208 Other forms of angina pectoris: Secondary | ICD-10-CM

## 2017-05-26 DIAGNOSIS — E1142 Type 2 diabetes mellitus with diabetic polyneuropathy: Secondary | ICD-10-CM | POA: Diagnosis not present

## 2017-05-26 DIAGNOSIS — Z6830 Body mass index (BMI) 30.0-30.9, adult: Secondary | ICD-10-CM | POA: Diagnosis not present

## 2017-05-26 DIAGNOSIS — J42 Unspecified chronic bronchitis: Secondary | ICD-10-CM | POA: Diagnosis not present

## 2017-05-26 DIAGNOSIS — R413 Other amnesia: Secondary | ICD-10-CM | POA: Diagnosis not present

## 2017-05-26 DIAGNOSIS — G588 Other specified mononeuropathies: Secondary | ICD-10-CM | POA: Diagnosis not present

## 2017-05-26 DIAGNOSIS — M1009 Idiopathic gout, multiple sites: Secondary | ICD-10-CM

## 2017-05-26 DIAGNOSIS — E1169 Type 2 diabetes mellitus with other specified complication: Secondary | ICD-10-CM

## 2017-05-26 DIAGNOSIS — G4730 Sleep apnea, unspecified: Secondary | ICD-10-CM | POA: Diagnosis not present

## 2017-05-26 DIAGNOSIS — E785 Hyperlipidemia, unspecified: Secondary | ICD-10-CM | POA: Diagnosis not present

## 2017-05-26 DIAGNOSIS — I10 Essential (primary) hypertension: Secondary | ICD-10-CM

## 2017-05-26 DIAGNOSIS — D696 Thrombocytopenia, unspecified: Secondary | ICD-10-CM

## 2017-05-26 DIAGNOSIS — I2510 Atherosclerotic heart disease of native coronary artery without angina pectoris: Secondary | ICD-10-CM

## 2017-05-26 DIAGNOSIS — F339 Major depressive disorder, recurrent, unspecified: Secondary | ICD-10-CM

## 2017-05-26 LAB — BAYER DCA HB A1C WAIVED: HB A1C (BAYER DCA - WAIVED): 5.6 % (ref <7.0)

## 2017-05-26 MED ORDER — LISINOPRIL 10 MG PO TABS: 10.0000 mg | ORAL_TABLET | Freq: Every day | ORAL | 5 refills | Status: DC

## 2017-05-26 MED ORDER — IPRATROPIUM-ALBUTEROL 20-100 MCG/ACT IN AERS: 1.0000 | INHALATION_SPRAY | Freq: Four times a day (QID) | RESPIRATORY_TRACT | 5 refills | Status: DC

## 2017-05-26 MED ORDER — FLUTICASONE-SALMETEROL 100-50 MCG/DOSE IN AEPB: 1.0000 | INHALATION_SPRAY | Freq: Two times a day (BID) | RESPIRATORY_TRACT | 5 refills | Status: DC

## 2017-05-26 MED ORDER — MEMANTINE HCL 10 MG PO TABS: 10.0000 mg | ORAL_TABLET | Freq: Two times a day (BID) | ORAL | 4 refills | Status: DC

## 2017-05-26 MED ORDER — SERTRALINE HCL 100 MG PO TABS: 100.0000 mg | ORAL_TABLET | Freq: Every day | ORAL | 1 refills | Status: DC

## 2017-05-26 MED ORDER — ISOSORBIDE MONONITRATE ER 30 MG PO TB24: 30.0000 mg | ORAL_TABLET | Freq: Every day | ORAL | 5 refills | Status: DC

## 2017-05-26 MED ORDER — DONEPEZIL HCL 10 MG PO TABS: 5.0000 mg | ORAL_TABLET | Freq: Every day | ORAL | 3 refills | Status: DC

## 2017-05-26 NOTE — Progress Notes (Signed)
Subjective:    Patient ID: Dean Harris, male    DOB: Dec 27, 1940, 76 y.o.   MRN: [id number]  HPI Dean Harris is here today for follow up of chronic medical problem.  Outpatient Encounter Prescriptions as of 05/26/2017  Medication Sig  . aspirin 325 MG tablet Take 325 mg by mouth daily.  Marland Kitchen b complex vitamins tablet Take 1 tablet by mouth daily.  . Cholecalciferol (VITAMIN D) 2000 UNITS tablet Take 2,000 Units by mouth daily.   Marland Kitchen donepezil (ARICEPT) 10 MG tablet Take 0.5 tablets (5 mg total) by mouth at bedtime.  . Fluticasone-Salmeterol (ADVAIR DISKUS) 100-50 MCG/DOSE AEPB Inhale 1 puff into the lungs every 12 (twelve) hours.  Marland Kitchen glucose blood (FREESTYLE LITE) test strip Test 1X per day and prn   Dx. E11.9  . Ipratropium-Albuterol (COMBIVENT) 20-100 MCG/ACT AERS respimat Inhale 1 puff into the lungs every 6 (six) hours.  . isosorbide mononitrate (IMDUR) 30 MG 24 hr tablet Take 1 tablet (30 mg total) by mouth daily.  . Lancets (FREESTYLE) lancets Test 1x per day and prn  E11.9  . lisinopril (PRINIVIL,ZESTRIL) 10 MG tablet Take 1 tablet (10 mg total) by mouth daily.  . memantine (NAMENDA) 10 MG tablet Take 1 tablet (10 mg total) by mouth 2 (two) times daily.  . metFORMIN (GLUCOPHAGE) 500 MG tablet Take 0.5 tablets (250 mg total) by mouth 2 (two) times daily with a meal.  . nitroGLYCERIN (NITROSTAT) 0.4 MG SL tablet Place 63 tablets (25 mg total) under the tongue every 5 (five) minutes as needed for chest pain.  Marland Kitchen sertraline (ZOLOFT) 100 MG tablet Take 1 tablet (100 mg total) by mouth daily.  . travoprost, benzalkonium, (TRAVATAN) 0.004 % ophthalmic solution 1 drop at bedtime.   No facility-administered encounter medications on file as of 05/26/2017.     1. Type 2 diabetes mellitus with diabetic polyneuropathy, without long-term current use of insulin ([place])  Managed with metformin.  Patient does not check blood glucose daily.  Last A1C 6.7 on 02/20/17.  2. Hyperlipidemia, unspecified  hyperlipidemia type  Currently managed with diet.  Regularly checking labs.  3. Essential hypertension, benign  Blood pressure managed with lisinopril.  Patient does not check blood pressure at home.  4. Chronic stable angina (HCC)  No complaints of chest pain at present.  Taking daily aspirin.  5. Sleep apnea, unspecified type  No problems with snoring currently.  No longer wearing CPAP at night.  6. Chronic bronchitis, unspecified chronic bronchitis type ([place])  On Advair to help with breathing.  7. Hyperlipidemia associated with type 2 diabetes mellitus ([place])  Managed with diet currently.  8. Other mononeuropathy  Neuropathy in feet.  9. BMI 30.0-30.9,adult No significant weight gain or loss.   10. Thrombocytopenia ([place])  Blood work today.  11. Memory disorder Seeing neurologist regularly for memory help.  Taking Aricept daily.  12. Idiopathic gout of multiple sites, unspecified chronicity  No recent flare ups.      New complaints: None today.  Social history: Wife takes good care of him- she has noticed declining memory   Review of Systems  Constitutional: Negative for activity change and appetite change.  Respiratory: Negative for cough, shortness of breath and wheezing.   Cardiovascular: Negative for chest pain and palpitations.  Neurological: Negative for dizziness, light-headedness and headaches.  All other systems reviewed and are negative.      Objective:   Physical Exam  Constitutional: He appears well-developed and well-nourished. No distress.  HENT:  Right Ear: External ear normal.  Left Ear: External ear normal.  Mouth/Throat: Oropharynx is clear and moist.  Eyes: Pupils are equal, round, and reactive to light.  Neck: Normal range of motion. Neck supple.  Cardiovascular: Normal rate, regular rhythm and intact distal pulses.   Murmur (2/6 systolic murmur) heard. Pulmonary/Chest: Effort normal and breath sounds normal. No respiratory distress. He has no  wheezes.  Abdominal: Soft. Bowel sounds are normal. He exhibits no distension. There is no tenderness.  Musculoskeletal: Normal range of motion.  Lymphadenopathy:    He has no cervical adenopathy.  Neurological: He is alert.  Skin: Skin is warm and dry.  Psychiatric: He has a normal mood and affect. His behavior is normal.   BP 118/66   Pulse 64   Temp (!) 97.1 F (36.2 C) (Oral)   Ht '5\' 3"'$  (1.6 m)   Wt 147 lb (66.7 kg)   BMI 26.04 kg/m  A1C;  5.6%    Assessment & Plan:  1. Type 2 diabetes mellitus with diabetic polyneuropathy, without long-term current use of insulin (HCC) Hold metformin Continue to watch carbs in diet Keep diary of blood sugars and let me know if they increae - Bayer DCA Hb A1c Waived - Microalbumin / creatinine urine ratio  2. Hyperlipidemia, unspecified hyperlipidemia type Low fat diet - Lipid panel  3. Essential hypertension, benign Low sodium diet - CMP14+EGFR - lisinopril (PRINIVIL,ZESTRIL) 10 MG tablet; Take 1 tablet (10 mg total) by mouth daily.  Dispense: 30 tablet; Refill: 5  4. Chronic stable angina ([place])  5. Sleep apnea, unspecified type  6. Chronic bronchitis, unspecified chronic bronchitis type ([place]) - Ipratropium-Albuterol (COMBIVENT) 20-100 MCG/ACT AERS respimat; Inhale 1 puff into the lungs every 6 (six) hours.  Dispense: 4 g; Refill: 5 - Fluticasone-Salmeterol (ADVAIR DISKUS) 100-50 MCG/DOSE AEPB; Inhale 1 puff into the lungs every 12 (twelve) hours.  Dispense: 60 each; Refill: 5  7. Hyperlipidemia associated with type 2 diabetes mellitus ([place])   8. Other mononeuropathy  9. BMI 30.0-30.9,adult Discussed diet and exercise for person with BMI >25 Will recheck weight in 3-6 months  10. Thrombocytopenia ([place])  11. Memory disorder Suggested  luminoscity on computer- brain exercises - memantine (NAMENDA) 10 MG tablet; Take 1 tablet (10 mg total) by mouth 2 (two) times daily.  Dispense: 180 tablet; Refill: 4 - donepezil (ARICEPT)  10 MG tablet; Take 0.5 tablets (5 mg total) by mouth at bedtime.  Dispense: 90 tablet; Refill: 3  12. Idiopathic gout of multiple sites, unspecified chronicity  13. Coronary artery disease, non-occlusive - isosorbide mononitrate (IMDUR) 30 MG 24 hr tablet; Take 1 tablet (30 mg total) by mouth daily.  Dispense: 30 tablet; Refill: 5  14. Depression, recurrent ([place]) Stress management - sertraline (ZOLOFT) 100 MG tablet; Take 1 tablet (100 mg total) by mouth daily.  Dispense: 90 tablet; Refill: 1    Labs pending Health maintenance reviewed Diet and exercise encouraged Continue all meds Follow up  In 3 months   [place], FNP

## 2017-05-26 NOTE — Patient Instructions (Signed)
Dementia Dementia means losing some of your brain ability. People with dementia may have problems with:  Memory.  Making decisions.  Behavior.  Speaking.  Thinking.  Solving problems.  Follow these instructions at home: Medicine  Take over-the-counter and prescription medicines only as told by your doctor.  Avoid taking medicines that can change how you think. These include pain or sleeping medicines. Lifestyle   Make healthy choices: ? Be active as told by your doctor. ? Do not use any tobacco products, such as cigarettes, chewing tobacco, and e-cigarettes. If you need help quitting, ask your doctor. ? Eat a healthy diet. ? When you get stressed, do something to help yourself relax. Your doctor can give you tips. ? Spend time with other people.  Drink enough fluid to keep your pee (urine) clear or pale yellow.  Make sure you get good sleep. Use these tips to help you get a good night's rest: ? Try not to take naps during the day. ? Keep your sleeping area dark and cool. ? In the few hours before you go to bed, try not to do any exercise. ? Try not to have foods and drinks with caffeine in the evening. General instructions  Talk with your doctor to figure out: ? What you need help with. ? What your safety needs are.  If you were given a bracelet that tracks your location, make sure to wear it.  Keep all follow-up visits as told by your doctor. This is important. Contact a doctor if:  You have any new problems.  You have problems with choking or swallowing.  You have any symptoms of a different sickness. Get help right away if:  You have a fever.  You feel mixed up (confused) or more mixed up than before.  You have new sleepiness.  You have sleepiness that gets worse.  You have a hard time staying awake.  You or your family members are worried for your safety. This information is not intended to replace advice given to you by your health care  provider. Make sure you discuss any questions you have with your health care provider. Document Released: 08/14/2008 Document Revised: 02/07/2016 Document Reviewed: 05/30/2015 Elsevier Interactive Patient Education  2018 Elsevier Inc.  

## 2017-05-27 LAB — CMP14+EGFR
ALK PHOS: 57 IU/L (ref 39–117)
ALT: 20 IU/L (ref 0–44)
AST: 21 IU/L (ref 0–40)
Albumin/Globulin Ratio: 2.2 (ref 1.2–2.2)
Albumin: 4.3 g/dL (ref 3.5–4.8)
BUN / CREAT RATIO: 13 (ref 10–24)
BUN: 18 mg/dL (ref 8–27)
Bilirubin Total: 1.1 mg/dL (ref 0.0–1.2)
CO2: 25 mmol/L (ref 20–29)
CREATININE: 1.38 mg/dL — AB (ref 0.76–1.27)
Calcium: 9.4 mg/dL (ref 8.6–10.2)
Chloride: 96 mmol/L (ref 96–106)
GFR calc Af Amer: 58 mL/min/{1.73_m2} — ABNORMAL LOW (ref >59)
GFR calc non Af Amer: 50 mL/min/{1.73_m2} — ABNORMAL LOW (ref >59)
GLUCOSE: 97 mg/dL (ref 65–99)
Globulin, Total: 2 g/dL (ref 1.5–4.5)
Potassium: 4.2 mmol/L (ref 3.5–5.2)
Sodium: 139 mmol/L (ref 134–144)
TOTAL PROTEIN: 6.3 g/dL (ref 6.0–8.5)

## 2017-05-27 LAB — LIPID PANEL
CHOLESTEROL TOTAL: 173 mg/dL (ref 100–199)
Chol/HDL Ratio: 3.4 ratio (ref 0.0–5.0)
HDL: 51 mg/dL (ref >39)
LDL CALC: 101 mg/dL — AB (ref 0–99)
Triglycerides: 107 mg/dL (ref 0–149)
VLDL CHOLESTEROL CAL: 21 mg/dL (ref 5–40)

## 2017-05-27 LAB — MICROALBUMIN / CREATININE URINE RATIO
Creatinine, Urine: 76.6 mg/dL
Microalb/Creat Ratio: <3.9 mg/g creat (ref 0.0–30.0)
Microalbumin, Urine: <3 ug/mL

## 2017-07-22 ENCOUNTER — Encounter: Payer: Self-pay | Admitting: Cardiology

## 2017-07-22 ENCOUNTER — Ambulatory Visit: Payer: Medicare Other | Admitting: Cardiology

## 2017-07-22 VITALS — BP 162/72 | HR 65 | Ht 62.0 in | Wt 154.6 lb

## 2017-07-22 DIAGNOSIS — I2510 Atherosclerotic heart disease of native coronary artery without angina pectoris: Secondary | ICD-10-CM

## 2017-07-22 DIAGNOSIS — I350 Nonrheumatic aortic (valve) stenosis: Secondary | ICD-10-CM

## 2017-07-22 DIAGNOSIS — I208 Other forms of angina pectoris: Secondary | ICD-10-CM

## 2017-07-22 DIAGNOSIS — I10 Essential (primary) hypertension: Secondary | ICD-10-CM

## 2017-07-22 NOTE — Assessment & Plan Note (Signed)
Probably not alwaysChew ischemic symptoms.  May be more related again toward diastolic dysfunction and microvascular ischemia. Need to closely monitor his blood pressures.  It is somewhat elevated today, but has been relatively normal of late.  May need to consider increasing his lisinopril to 20 mg daily if his pressure continues to be elevated.

## 2017-07-22 NOTE — Patient Instructions (Signed)
SCHEDULE AT [address]- IN MAY 2019 Your physician has requested that you have an echocardiogram. Echocardiography is a painless test that uses sound waves to create images of your heart. It provides your doctor with information about the size and shape of your heart and how well your heart's chambers and valves are working. This procedure takes approximately one hour. There are no restrictions for this procedure.   PLEASE HAVE PRIMARY DOCTOR OR TO FOLLOW UP /RECHECK BLOOD PRESSURE AT NEXT VISIT.   Your physician wants you to follow-up in 6 MONTHS WITH DR HARDING. You will receive a reminder letter in the mail two months in advance. If you don't receive a letter, please call our office to schedule the follow-up appointment.   If you need a refill on your cardiac medications before your next appointment, please call your pharmacy.

## 2017-07-22 NOTE — Assessment & Plan Note (Signed)
Stable moderate aortic stenosis by echo.  Due for follow-up next summer prior to his follow-up appointment.  He is showing some signs of diastolic dysfunction with LVH related to her stenosis and hypertension.  Pay close attention to blood pressure control.

## 2017-07-22 NOTE — Assessment & Plan Note (Signed)
Despite having aortic valve disease, he only had moderate nonocclusive disease by cardiac apposition 7 years ago.  I suspect that his exertional dyspnea related chest discomfort could be related more toward his LVH and diastolic dysfunction. He is not on a beta-blocker because of his long-term lung disease.  He is on Imdur which seems to help some.  He does take aspirin.  He is not on a statin because of memory issues

## 2017-07-22 NOTE — Assessment & Plan Note (Signed)
Has been well controlled, but elevated now.  In light of his LVH and aortic stenosis, it would be nice if his blood pressure little better controlled.  He is due to see his PCP in about a month.  Hopefully if his blood pressures are back to where the baseline is we would not need to change anything, otherwise if there is still show elevated numbers, I would increase his lisinopril dose to 20 mg daily.

## 2017-07-22 NOTE — Progress Notes (Signed)
PCP: Bennie PieriniMartin, Mary-Margaret, FNP  Clinic Note: No chief complaint on file.   HPI: Dean Harris is a 76 y.o. male with a PMH below who presents today for Six-month follow-up for CAD with chronic stable angina as well as Moderate AS by Echo. He has a chronic pulmonary condition Status post bilateral carotid endarterectomy after an episode of amaurosis fugax in 2004. .Prior to my seeing him, he been evaluated by Dr. [name] for chest pain in May 2013. His cardiac catheterization showed diffuse 50% disease in the mid LAD as well as mid RCA and proximal OM 1. Normal EF with moderately elevated LVEDP of 25mmHg. --He has some baseline mild dementia issues and relatively poor historian.  He gets easily frazzled and has a hard time fully answering questions and looks to his wife to answer.  Dean GlassDaniel C Harris was last seen in May 2018 - doing OK.  Had Echo ordered.     Hospitalizations: None  Studies Personally Reviewed - if available, images/films reviewed: From Epic Chart or Care Everywhere  Echo May 2017: 60-65%. NO RWMA. Mod AS  Echo May 2018: (Stable) moderate LVH.  EF 60-65%.  GR to DD.  Moderate aortic stenosis.  Interval History: Dean Harris returns today overall doing fairly well.  He really is doing okay from a cardiac standpoint.  He is still very active and denies any symptoms with routine activity, but if he does overexert he will take a break.  For instance they were doing lots of yard work outside this past weekend and he was doing a lot of heavy exertion and cannot push to a little bit too far.  He got really short of breath and felt some tightness in his chest.  This is on the he is been dealing with for quite some time because of his lungs.  This usually goes away if he rests for a few minutes and then he is able to get back into activity. Otherwise he really denies any resting or exertional chest tightness or pressure.  Only short of breath if he really pushes it. No heart failure symptoms  of PND, orthopnea or edema.  No rapid irregular heartbeats palpitations, syncope/near syncope or TIA/amaurosis fugax.  No claudication.  ROS: A comprehensive was performed. Pertinent positives and negatives noted above Review of Systems  Constitutional: Negative for malaise/fatigue and weight loss.  HENT: Negative for congestion and nosebleeds.   Respiratory: Negative for cough, shortness of breath (Baseline SOB due to lung disease) and wheezing.   Gastrointestinal: Negative for blood in stool, constipation, heartburn and melena.  Genitourinary: Negative for hematuria.  Musculoskeletal: Negative for falls and joint pain.  Neurological: Positive for dizziness (Sometimes with position change).  Endo/Heme/Allergies: Positive for environmental allergies.  Psychiatric/Behavioral: Positive for memory loss. Negative for depression.  All other systems reviewed and are negative.  I have reviewed and (if needed) personally updated the patient's problem list, medications, allergies, past medical and surgical history, social and family history.   Past Medical History:  Diagnosis Date  . Chronic stable angina (HCC)   . 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 Depression   . Diabetes mellitus   . Essential hypertension   . Hyperlipidemia with target LDL less than 70   . 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 Pneumonia    x2  .  PTSD (post-traumatic stress disorder) - with depression symptoms    Recently started on medication by the [place]. This has helped his memory issues.  . Thrombocytopenia (HCC) 04/29/2014  . Vitamin D deficiency     Past Surgical History:  Procedure Laterality Date  . CAROTID ENDARTERECTOMY    . TRANSTHORACIC ECHOCARDIOGRAM  01/2012   EF 55-60%. Mild LVH. No RWMA, mild Aortic Stenosis (mean/peak  gradient 13 mmHg/21 mmHg)  . TRANSTHORACIC ECHOCARDIOGRAM  02/07/2016   EF 60-65% with normal wall motion. Normal diastolic function for age. Moderate aortic stenosis (mean/peak gradient 24/35 mmHg)  . US CAROTID DOPPLER BILATERAL ([facility] HX) Bilateral 02/05/2016   Stable mild-to-moderate disease with bilateral heterogeneous plaque. RICA - 1-39%, LICA 40-59%. Bilateral subclavian and vertebral arteries normal.    Current Meds  Medication Sig  . aspirin 325 MG tablet Take 325 mg by mouth daily.  Marland Kitchen b complex vitamins tablet Take 1 tablet by mouth daily.  . Cholecalciferol (VITAMIN D) 2000 UNITS tablet Take 2,000 Units by mouth daily.   Marland Kitchen donepezil (ARICEPT) 10 MG tablet Take 0.5 tablets (5 mg total) by mouth at bedtime.  . Fluticasone-Salmeterol (ADVAIR DISKUS) 100-50 MCG/DOSE AEPB Inhale 1 puff into the lungs every 12 (twelve) hours.  Marland Kitchen glucose blood (FREESTYLE LITE) test strip Test 1X per day and prn   Dx. E11.9  . Ipratropium-Albuterol (COMBIVENT) 20-100 MCG/ACT AERS respimat Inhale 1 puff into the lungs every 6 (six) hours.  . isosorbide mononitrate (IMDUR) 30 MG 24 hr tablet Take 1 tablet (30 mg total) by mouth daily.  . Lancets (FREESTYLE) lancets Test 1x per day and prn  E11.9  . lisinopril (PRINIVIL,ZESTRIL) 10 MG tablet Take 1 tablet (10 mg total) by mouth daily.  . memantine (NAMENDA) 10 MG tablet Take 1 tablet (10 mg total) by mouth 2 (two) times daily.  . nitroGLYCERIN (NITROSTAT) 0.4 MG SL tablet Place 63 tablets (25 mg total) under the tongue every 5 (five) minutes as needed for chest pain.  Marland Kitchen sertraline (ZOLOFT) 100 MG tablet Take 1 tablet (100 mg total) by mouth daily.  . travoprost, benzalkonium, (TRAVATAN) 0.004 % ophthalmic solution 1 drop at bedtime.    Allergies  Allergen Reactions  . Dust Mite Extract   . Mold Extract [Trichophyton]   . Pollen Extract   . Tetracyclines & Related     Generic only    Social History   Socioeconomic History  . Marital status:  Married    Spouse name: Bonita Quin  . Number of children: 1  . Years of education: 46  . Highest education level: None  Social Needs  . Financial resource strain: None  . Food insecurity - worry: None  . Food insecurity - inability: None  . Transportation needs - medical: None  . Transportation needs - non-medical: None  Occupational History  . Occupation: N/A  Tobacco Use  . Smoking status: Former Smoker    Types: Cigarettes    Last attempt to quit: 02/10/1988    Years since quitting: 29.4  . Smokeless tobacco: Never Used  Substance and Sexual Activity  . Alcohol use: No  . Drug use: No  . Sexual activity: None    Comment: Married  Other Topics Concern  . None  Social History Narrative   Lives at home w/ wife   Right-handed   Caffeine: 5-6 cups per day    family history includes Cancer in his father and other; Diabetes in his mother; Hyperlipidemia in his mother; Hypertension in his brother  and mother; Stroke in his mother.  Wt Readings from Last 3 Encounters:  07/22/17 154 lb 9.6 oz (70.1 kg)  05/26/17 147 lb (66.7 kg)  03/30/17 151 lb (68.5 kg)    PHYSICAL EXAM BP (!) 162/72   Pulse 65   Ht 5\' 2"  (1.575 m)   Wt 154 lb 9.6 oz (70.1 kg)   BMI 28.28 kg/m   Physical Exam  Constitutional: He is oriented to person, place, and time. He appears well-developed and well-nourished. No distress.  Heard of hearing.  Slow response to questions - easily flustered.  HENT:  Head: Normocephalic and atraumatic.  Eyes: EOM are normal.  Neck: No hepatojugular reflux and no JVD present. Carotid bruit is not present.  Cardiovascular: Normal rate, regular rhythm and normal pulses.  No extrasystoles are present. PMI is not displaced. Exam reveals no gallop.  Murmur heard. High-pitched harsh crescendo-decrescendo midsystolic murmur is present with a grade of 3/6 at the upper right sternal border radiating to the neck. Pulmonary/Chest: Effort normal and breath sounds normal. No respiratory  distress. He has no wheezes. He has no rales.  Abdominal: Soft. Bowel sounds are normal. He exhibits no distension. There is no tenderness. There is no rebound.  Musculoskeletal: Normal range of motion. He exhibits no edema or deformity.  Neurological: He is alert and oriented to person, place, and time.  Skin: Skin is warm and dry. No rash noted. No erythema.  Psychiatric: He has a normal mood and affect. His behavior is normal. Judgment and thought content normal.    Adult ECG Report None  Other studies Reviewed: Additional studies/ records that were reviewed today include:  Recent Labs:   Lab Results  Component Value Date   CHOL 173 05/26/2017   HDL 51 05/26/2017   LDLCALC 101 (H) 05/26/2017   TRIG 107 05/26/2017   CHOLHDL 3.4 05/26/2017   Lab Results  Component Value Date   CREATININE 1.38 (H) 05/26/2017   BUN 18 05/26/2017   NA 139 05/26/2017   K 4.2 05/26/2017   CL 96 05/26/2017   CO2 25 05/26/2017   ASSESSMENT / PLAN: Problem List Items Addressed This Visit    Chronic stable angina (HCC) (Chronic)    Probably not alwaysChew ischemic symptoms.  May be more related again toward diastolic dysfunction and microvascular ischemia. Need to closely monitor his blood pressures.  It is somewhat elevated today, but has been relatively normal of late.  May need to consider increasing his lisinopril to 20 mg daily if his pressure continues to be elevated.      Relevant Orders   EKG 12-Lead   ECHOCARDIOGRAM COMPLETE   Coronary artery disease, non-occlusive (Chronic)    Despite having aortic valve disease, he only had moderate nonocclusive disease by cardiac apposition 7 years ago.  I suspect that his exertional dyspnea related chest discomfort could be related more toward his LVH and diastolic dysfunction. He is not on a beta-blocker because of his long-term lung disease.  He is on Imdur which seems to help some.  He does take aspirin.  He is not on a statin because of memory  issues      Relevant Orders   EKG 12-Lead   ECHOCARDIOGRAM COMPLETE   Essential hypertension, benign (Chronic)    Has been well controlled, but elevated now.  In light of his LVH and aortic stenosis, it would be nice if his blood pressure little better controlled.  He is due to see his PCP in about a  month.  Hopefully if his blood pressures are back to where the baseline is we would not need to change anything, otherwise if there is still show elevated numbers, I would increase his lisinopril dose to 20 mg daily.      Moderate aortic stenosis - Primary (Chronic)    Stable moderate aortic stenosis by echo.  Due for follow-up next summer prior to his follow-up appointment.  He is showing some signs of diastolic dysfunction with LVH related to her stenosis and hypertension.  Pay close attention to blood pressure control.      Relevant Orders   EKG 12-Lead   ECHOCARDIOGRAM COMPLETE      Current medicines are reviewed at length with the patient today. (+/- concerns) None The following changes have been made: None  Patient Instructions  SCHEDULE AT [address]- IN MAY 2019 Your physician has requested that you have an echocardiogram. Echocardiography is a painless test that uses sound waves to create images of your heart. It provides your doctor with information about the size and shape of your heart and how well your heart's chambers and valves are working. This procedure takes approximately one hour. There are no restrictions for this procedure.   PLEASE HAVE PRIMARY DOCTOR OR TO FOLLOW UP /RECHECK BLOOD PRESSURE AT NEXT VISIT.   Your physician wants you to follow-up in 6 MONTHS WITH DR Kyrie Bun. You will receive a reminder letter in the mail two months in advance. If you don't receive a letter, please call our office to schedule the follow-up appointment.   If you need a refill on your cardiac medications before your next appointment, please call your  pharmacy.     Studies Ordered:   Orders Placed This Encounter  Procedures  . EKG 12-Lead  . ECHOCARDIOGRAM COMPLETE      Bryan Lemma, M.D., M.S. Interventional Cardiologist   Pager # [phone] Phone # [phone] [address] [place], [place] [address]

## 2017-08-27 ENCOUNTER — Ambulatory Visit: Payer: Medicare Other | Admitting: Nurse Practitioner

## 2017-09-01 ENCOUNTER — Ambulatory Visit: Payer: Medicare Other | Admitting: Nurse Practitioner

## 2017-09-01 ENCOUNTER — Encounter: Payer: Self-pay | Admitting: Nurse Practitioner

## 2017-09-01 VITALS — BP 153/67 | HR 63 | Temp 97.0°F | Ht 62.0 in | Wt 153.0 lb

## 2017-09-01 DIAGNOSIS — E1142 Type 2 diabetes mellitus with diabetic polyneuropathy: Secondary | ICD-10-CM

## 2017-09-01 DIAGNOSIS — J42 Unspecified chronic bronchitis: Secondary | ICD-10-CM

## 2017-09-01 DIAGNOSIS — M1009 Idiopathic gout, multiple sites: Secondary | ICD-10-CM

## 2017-09-01 DIAGNOSIS — G4730 Sleep apnea, unspecified: Secondary | ICD-10-CM

## 2017-09-01 DIAGNOSIS — I10 Essential (primary) hypertension: Secondary | ICD-10-CM

## 2017-09-01 DIAGNOSIS — E785 Hyperlipidemia, unspecified: Secondary | ICD-10-CM

## 2017-09-01 DIAGNOSIS — D696 Thrombocytopenia, unspecified: Secondary | ICD-10-CM | POA: Diagnosis not present

## 2017-09-01 DIAGNOSIS — R413 Other amnesia: Secondary | ICD-10-CM

## 2017-09-01 DIAGNOSIS — G588 Other specified mononeuropathies: Secondary | ICD-10-CM

## 2017-09-01 DIAGNOSIS — Z6830 Body mass index (BMI) 30.0-30.9, adult: Secondary | ICD-10-CM | POA: Diagnosis not present

## 2017-09-01 DIAGNOSIS — E1169 Type 2 diabetes mellitus with other specified complication: Secondary | ICD-10-CM | POA: Diagnosis not present

## 2017-09-01 DIAGNOSIS — I6523 Occlusion and stenosis of bilateral carotid arteries: Secondary | ICD-10-CM | POA: Diagnosis not present

## 2017-09-01 DIAGNOSIS — I2510 Atherosclerotic heart disease of native coronary artery without angina pectoris: Secondary | ICD-10-CM | POA: Diagnosis not present

## 2017-09-01 LAB — BAYER DCA HB A1C WAIVED: HB A1C: 6.2 % (ref <7.0)

## 2017-09-01 MED ORDER — MEMANTINE HCL 10 MG PO TABS: 10.0000 mg | ORAL_TABLET | Freq: Two times a day (BID) | ORAL | 4 refills | Status: DC

## 2017-09-01 MED ORDER — IPRATROPIUM-ALBUTEROL 20-100 MCG/ACT IN AERS: 1.0000 | INHALATION_SPRAY | Freq: Four times a day (QID) | RESPIRATORY_TRACT | 5 refills | Status: DC

## 2017-09-01 MED ORDER — LISINOPRIL 20 MG PO TABS: 20.0000 mg | ORAL_TABLET | Freq: Every day | ORAL | 1 refills | Status: DC

## 2017-09-01 MED ORDER — FLUTICASONE-SALMETEROL 100-50 MCG/DOSE IN AEPB: 1.0000 | INHALATION_SPRAY | Freq: Two times a day (BID) | RESPIRATORY_TRACT | 5 refills | Status: DC

## 2017-09-01 NOTE — Patient Instructions (Signed)

## 2017-09-01 NOTE — Progress Notes (Addendum)
Subjective:    Patient ID: Dean Harris, male    DOB: 18-Jul-1941, 76 y.o.   MRN: [id number]  HPI  FIDEL CAGGIANO is here today for follow up of chronic medical problem.  Outpatient Encounter Medications as of 09/01/2017  Medication Sig  . aspirin 325 MG tablet Take 325 mg by mouth daily.  Marland Kitchen b complex vitamins tablet Take 1 tablet by mouth daily.  . Cholecalciferol (VITAMIN D) 2000 UNITS tablet Take 2,000 Units by mouth daily.   Marland Kitchen donepezil (ARICEPT) 10 MG tablet Take 0.5 tablets (5 mg total) by mouth at bedtime.  . Fluticasone-Salmeterol (ADVAIR DISKUS) 100-50 MCG/DOSE AEPB Inhale 1 puff into the lungs every 12 (twelve) hours.  Marland Kitchen glucose blood (FREESTYLE LITE) test strip Test 1X per day and prn   Dx. E11.9  . Ipratropium-Albuterol (COMBIVENT) 20-100 MCG/ACT AERS respimat Inhale 1 puff into the lungs every 6 (six) hours.  . isosorbide mononitrate (IMDUR) 30 MG 24 hr tablet Take 1 tablet (30 mg total) by mouth daily.  . Lancets (FREESTYLE) lancets Test 1x per day and prn  E11.9  . lisinopril (PRINIVIL,ZESTRIL) 10 MG tablet Take 1 tablet (10 mg total) by mouth daily.  . memantine (NAMENDA) 10 MG tablet Take 1 tablet (10 mg total) by mouth 2 (two) times daily.  . nitroGLYCERIN (NITROSTAT) 0.4 MG SL tablet Place 63 tablets (25 mg total) under the tongue every 5 (five) minutes as needed for chest pain.  Marland Kitchen sertraline (ZOLOFT) 100 MG tablet Take 1 tablet (100 mg total) by mouth daily.  . travoprost, benzalkonium, (TRAVATAN) 0.004 % ophthalmic solution 1 drop at bedtime.     1. Type 2 diabetes mellitus with diabetic polyneuropathy, without long-term current use of insulin ([place]) last hgba1c was 5.6%. Des not check blood sugars every day but have remained below 140.  2. Essential hypertension  NO c/o chest pain, SOB or headache. Does not check blood pressure at home. BP Readings from Last 3 Encounters:  07/22/17 (!) 162/72  05/26/17 118/66  03/30/17 (!) 110/52     3. Hyperlipidemia  associated with type 2 diabetes mellitus ([place])  Wife really watches what she cooks for him.  4. Coronary artery disease, non-occlusive  Saw cardiologist in November . No changes were made but was told to keep close check on blood pressure  5. Essential hypertension, benign  Already discussed above  6. Carotid artery plaque, bilateral; s/p Bilateral CEA  Cardiology is watching  7. Sleep apnea, unspecified type  Does not have CPAP- had test at [facility]- waiting on test resluts  8. Chronic bronchitis, unspecified chronic bronchitis type (HCC)  Cough has improved- uses inhalers daily  9. Other mononeuropathy  Occasional burning in bil feet- intermittent  10. Intermittent memory loss  Wife says he is pretty stable at this point. Is on aricept and namenda  11. Idiopathic gout of multiple sites, unspecified chronicity  No recent gout flare ups  12. Thrombocytopenia ([place])  Labs to be checked today  13. BMI 30.0-30.9,adult  No recent weight changes    New complaints: Rash on buttocks- itchy  Social history: Lives with wife who is pretty healthy and takes good care of him.    Review of Systems  Constitutional: Negative for activity change and appetite change.  HENT: Negative.   Eyes: Negative for pain.  Respiratory: Negative for shortness of breath.   Cardiovascular: Negative for chest pain, palpitations and leg swelling.  Gastrointestinal: Negative for abdominal pain.  Endocrine: Negative for polydipsia.  Genitourinary: Negative.   Skin: Negative for rash.  Neurological: Negative for dizziness, weakness and headaches.  Hematological: Does not bruise/bleed easily.  Psychiatric/Behavioral: Negative.   All other systems reviewed and are negative.      Objective:   Physical Exam  Constitutional: He is oriented to person, place, and time. He appears well-developed and well-nourished.  HENT:  Head: Normocephalic.  Right Ear: External ear normal.  Left Ear: External ear normal.    Nose: Nose normal.  Mouth/Throat: Oropharynx is clear and Harris.  Eyes: EOM are normal. Pupils are equal, round, and reactive to light.  Neck: Normal range of motion. Neck supple. No JVD present. No thyromegaly present.  Cardiovascular: Normal rate, regular rhythm and intact distal pulses. Exam reveals no gallop and no friction rub.  Murmur (3/6systolic murmur) heard. bil carotid bruits  Pulmonary/Chest: Effort normal and breath sounds normal. No respiratory distress. He has no wheezes. He has no rales. He exhibits no tenderness.  Abdominal: Soft. Bowel sounds are normal. He exhibits no mass. There is no tenderness.  Genitourinary: Prostate normal and penis normal.  Musculoskeletal: Normal range of motion. He exhibits no edema.  Lymphadenopathy:    He has no cervical adenopathy.  Neurological: He is alert and oriented to person, place, and time. No cranial nerve deficit.  Skin: Skin is warm and dry.  Erythematous dry patchy rash on left butt cheek  Psychiatric: He has a normal mood and affect. His behavior is normal. Judgment and thought content normal.  Answers all questions appropriately    BP (!) 153/67   Pulse 63   Temp (!) 97 F (36.1 C) (Oral)   Ht '5\' 2"'$  (1.575 m)   Wt 153 lb (69.4 kg)   BMI 27.98 kg/m       Assessment & Plan:  1. Type 2 diabetes mellitus with diabetic polyneuropathy, without long-term current use of insulin (HCC) Continue to watch carbs in diet - Bayer DCA Hb A1c Waived  2. Essential hypertension Low sodium diet Increased lisionpril to '20mg'$  - CMP14+EGFR - lisinopril (PRINIVIL,ZESTRIL) 20 MG tablet; Take 1 tablet (20 mg total) by mouth daily.  Dispense: 90 tablet; Refill: 1  3. Hyperlipidemia associated with type 2 diabetes mellitus (HCC) Low fat diet - Lipid panel  4. Coronary artery disease, non-occlusive Keep follow up with cardiology  5. Carotid artery plaque, bilateral; s/p Bilateral CEA Will continue to watch  6. Sleep apnea,  unspecified type Waiting on test [facility]  7. Chronic bronchitis, unspecified chronic bronchitis type ([place]) - Ipratropium-Albuterol (COMBIVENT) 20-100 MCG/ACT AERS respimat; Inhale 1 puff into the lungs every 6 (six) hours.  Dispense: 4 g; Refill: 5 - Fluticasone-Salmeterol (ADVAIR DISKUS) 100-50 MCG/DOSE AEPB; Inhale 1 puff into the lungs every 12 (twelve) hours.  Dispense: 60 each; Refill: 5  8. Other mononeuropathy Do not go barefooted  9. Intermittent memory loss  10. Idiopathic gout of multiple sites, unspecified chronicity  11. Thrombocytopenia ([place]) Labs pending - Anemia Profile B  12. BMI 30.0-30.9,adult Discussed diet and exercise for person with BMI >25 Will recheck weight in 3-6 months  13. Memory disorder - memantine (NAMENDA) 10 MG tablet; Take 1 tablet (10 mg total) by mouth 2 (two) times daily.  Dispense: 180 tablet; Refill: 4    Labs pending Health maintenance reviewed Diet and exercise encouraged Continue all meds Follow up  In 3 months   [place], FNP

## 2017-09-02 LAB — ANEMIA PROFILE B
BASOS ABS: 0.1 10*3/uL (ref 0.0–0.2)
Basos: 1 %
EOS (ABSOLUTE): 0.4 10*3/uL (ref 0.0–0.4)
Eos: 8 %
Hematocrit: 39.3 % (ref 37.5–51.0)
Hemoglobin: 13.1 g/dL (ref 13.0–17.7)
IMMATURE GRANULOCYTES: 0 %
Immature Grans (Abs): 0 10*3/uL (ref 0.0–0.1)
Lymphocytes Absolute: 2.1 10*3/uL (ref 0.7–3.1)
Lymphs: 39 %
MCH: 29.7 pg (ref 26.6–33.0)
MCHC: 33.3 g/dL (ref 31.5–35.7)
MCV: 89 fL (ref 79–97)
MONOCYTES: 6 %
Monocytes Absolute: 0.3 10*3/uL (ref 0.1–0.9)
NEUTROS PCT: 46 %
Neutrophils Absolute: 2.4 10*3/uL (ref 1.4–7.0)
PLATELETS: 144 10*3/uL — AB (ref 150–379)
RBC: 4.41 x10E6/uL (ref 4.14–5.80)
RDW: 14.1 % (ref 12.3–15.4)
RETIC CT PCT: 1.2 % (ref 0.6–2.6)
WBC: 5.3 10*3/uL (ref 3.4–10.8)

## 2017-09-02 LAB — CMP14+EGFR
A/G RATIO: 2.2 (ref 1.2–2.2)
ALT: 18 IU/L (ref 0–44)
AST: 16 IU/L (ref 0–40)
Albumin: 4.3 g/dL (ref 3.5–4.8)
Alkaline Phosphatase: 66 IU/L (ref 39–117)
BUN/Creatinine Ratio: 17 (ref 10–24)
BUN: 21 mg/dL (ref 8–27)
Bilirubin Total: 1.2 mg/dL (ref 0.0–1.2)
CALCIUM: 9.1 mg/dL (ref 8.6–10.2)
CO2: 27 mmol/L (ref 20–29)
CREATININE: 1.21 mg/dL (ref 0.76–1.27)
Chloride: 101 mmol/L (ref 96–106)
GFR, EST AFRICAN AMERICAN: 68 mL/min/{1.73_m2} (ref >59)
GFR, EST NON AFRICAN AMERICAN: 59 mL/min/{1.73_m2} — AB (ref >59)
Globulin, Total: 2 g/dL (ref 1.5–4.5)
Glucose: 119 mg/dL — ABNORMAL HIGH (ref 65–99)
POTASSIUM: 4.5 mmol/L (ref 3.5–5.2)
Sodium: 140 mmol/L (ref 134–144)
TOTAL PROTEIN: 6.3 g/dL (ref 6.0–8.5)

## 2017-09-02 LAB — LIPID PANEL
CHOL/HDL RATIO: 3.7 ratio (ref 0.0–5.0)
Cholesterol, Total: 167 mg/dL (ref 100–199)
HDL: 45 mg/dL (ref >39)
LDL CALC: 98 mg/dL (ref 0–99)
Triglycerides: 122 mg/dL (ref 0–149)
VLDL CHOLESTEROL CAL: 24 mg/dL (ref 5–40)

## 2017-09-02 LAB — SPECIMEN STATUS REPORT

## 2017-09-03 LAB — IRON AND TIBC
IRON SATURATION: 31 % (ref 15–55)
IRON: 89 ug/dL (ref 38–169)
TIBC: 286 ug/dL (ref 250–450)
UIBC: 197 ug/dL (ref 111–343)

## 2017-09-03 LAB — VITAMIN B12: Vitamin B-12: 779 pg/mL (ref 232–1245)

## 2017-09-03 LAB — FERRITIN: FERRITIN: 112 ng/mL (ref 30–400)

## 2017-09-03 LAB — FOLATE

## 2017-09-03 LAB — SPECIMEN STATUS REPORT

## 2017-10-01 ENCOUNTER — Ambulatory Visit: Payer: Medicare Other | Admitting: Adult Health

## 2017-10-01 ENCOUNTER — Encounter: Payer: Self-pay | Admitting: Adult Health

## 2017-10-01 VITALS — BP 120/58 | HR 62 | Ht 62.0 in | Wt 152.8 lb

## 2017-10-01 DIAGNOSIS — R413 Other amnesia: Secondary | ICD-10-CM | POA: Diagnosis not present

## 2017-10-01 NOTE — Patient Instructions (Signed)
Your Plan:  Continue Namenda and Aricept  Can stop Aricept if Diarrhea persist If your symptoms worsen or you develop new symptoms please let us know.   [facility] you [facility]. I hope we have been able to provide you high quality care today.  You may receive a patient satisfaction survey over the next few weeks. We would appreciate your feedback and comments so that we may continue to improve ourselves and the health of our patients.

## 2017-10-01 NOTE — Progress Notes (Signed)
PATIENT: Dean Harris DOB: Aug 24, 1941  REASON FOR VISIT: follow up HISTORY FROM: patient  HISTORY OF PRESENT ILLNESS: Today 10/01/17 Dean Harris is a 77 year old male with a history of memory disturbance.  He returns today for follow-up.  At the last visit he was having issues with diarrhea therefore we decreased Aricept down to 5 mg at bedtime.  He reports that his diarrhea has decreased in frequency.  He reports that he may have one episode a week.  He is on Namenda 10 mg twice a day.  He reports that he is tolerating this well.  He is able to complete most ADLs independently.  Denies any trouble sleeping.  Reports a good appetite.  His wife reports that he is a little more agitated between 1 and 4 PM.  She states that typically she is the trigger for his agitation.  He returns today for an evaluation.  HISTORY Dean Harris is a 77 year old male with a history of memory disturbance. He returns today for follow-up. He is currently on Aricept. The wife has noticed that he has about one episode of diarrhea a week. She states that when this occurs it's urgent and he may  have an accident. He is able to complete all ADLs independently. 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 For longer distances his wife typically drives. He denies any trouble sleeping other than he gets up 3 or 4 times to urinate. He is currently on Zoloft for his depression. He does see a psychiatrist. He returns today for an evaluation.   REVIEW OF SYSTEMS: Out of a complete 14 system review of symptoms, the patient complains only of the following symptoms, and all other reviewed systems are negative.  Memory loss, joint pain, back pain, restless leg, rash, murmur, runny nose, hearing loss, appetite change  ALLERGIES: Allergies  Allergen Reactions  . Dust Mite Extract   . Mold Extract [Trichophyton]   . Pollen Extract   . Tetracyclines & Related     Generic only    HOME MEDICATIONS: Outpatient  Medications Prior to Visit  Medication Sig Dispense Refill  . aspirin 325 MG tablet Take 325 mg by mouth daily.    Marland Kitchen b complex vitamins tablet Take 1 tablet by mouth daily.    . Cholecalciferol (VITAMIN D) 2000 UNITS tablet Take 2,000 Units by mouth daily.     Marland Kitchen donepezil (ARICEPT) 10 MG tablet Take 0.5 tablets (5 mg total) by mouth at bedtime. 90 tablet 3  . Fluticasone-Salmeterol (ADVAIR DISKUS) 100-50 MCG/DOSE AEPB Inhale 1 puff into the lungs every 12 (twelve) hours. 60 each 5  . glucose blood (FREESTYLE LITE) test strip Test 1X per day and prn   Dx. E11.9 100 each 12  . Ipratropium-Albuterol (COMBIVENT) 20-100 MCG/ACT AERS respimat Inhale 1 puff into the lungs every 6 (six) hours. 4 g 5  . isosorbide mononitrate (IMDUR) 30 MG 24 hr tablet Take 1 tablet (30 mg total) by mouth daily. 30 tablet 5  . Lancets (FREESTYLE) lancets Test 1x per day and prn  E11.9 100 each 12  . lisinopril (PRINIVIL,ZESTRIL) 20 MG tablet Take 1 tablet (20 mg total) by mouth daily. 90 tablet 1  . memantine (NAMENDA) 10 MG tablet Take 1 tablet (10 mg total) by mouth 2 (two) times daily. 180 tablet 4  . nitroGLYCERIN (NITROSTAT) 0.4 MG SL tablet Place 63 tablets (25 mg total) under the tongue every 5 (five) minutes as needed for chest  pain. 25 tablet 5  . sertraline (ZOLOFT) 100 MG tablet Take 1 tablet (100 mg total) by mouth daily. 90 tablet 1  . travoprost, benzalkonium, (TRAVATAN) 0.004 % ophthalmic solution 1 drop at bedtime.     No facility-administered medications prior to visit.     PAST MEDICAL HISTORY: Past Medical History:  Diagnosis Date  . Chronic stable angina (HCC)   . 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 Depression   . Diabetes mellitus   . Essential hypertension   . Hyperlipidemia with target LDL less than 70   . Moderate calcific aortic stenosis 01/2012   01/2012: Echo - mean/peak gradient12/21 mmHg; 01/2016: Moderate aortic  stenosis (mean/peak gradient 24/35 mmHg)   . Pneumonia    x2  . PTSD (post-traumatic stress disorder) - with depression symptoms    Recently started on medication by the [place]. This has helped his memory issues.  . Thrombocytopenia (HCC) 04/29/2014  . Vitamin D deficiency     PAST SURGICAL HISTORY: Past Surgical History:  Procedure Laterality Date  . CAROTID ENDARTERECTOMY    . LEFT HEART CATHETERIZATION WITH CORONARY ANGIOGRAM N/A 02/13/2012   Procedure: LEFT HEART CATHETERIZATION WITH CORONARY ANGIOGRAM;  Surgeon: Donato Schultz, MD;  Location: [facility] CATH LAB;  Service: Cardiovascular: Diffuse mid LAD 50%, 50%pOM1, 50%mRCA -> medical management  . TRANSTHORACIC ECHOCARDIOGRAM  01/2012   EF 55-60%. Mild LVH. No RWMA, mild Aortic Stenosis (mean/peak gradient 13 mmHg/21 mmHg)  . TRANSTHORACIC ECHOCARDIOGRAM  02/07/2016   EF 60-65% with normal wall motion. Normal diastolic function for age. Moderate aortic stenosis (mean/peak gradient 24/35 mmHg)  . US CAROTID DOPPLER BILATERAL ([facility] HX) Bilateral 02/05/2016   Stable mild-to-moderate disease with bilateral heterogeneous plaque. RICA - 1-39%, LICA 40-59%. Bilateral subclavian and vertebral arteries normal.    FAMILY HISTORY: Family History  Problem Relation Age of Onset  . Diabetes Mother   . Stroke Mother   . Hypertension Mother   . Hyperlipidemia Mother   . Cancer Father   . Hypertension Brother   . Cancer Other     SOCIAL HISTORY: Social History   Socioeconomic History  . Marital status: Married    Spouse name: Bonita Quin  . Number of children: 1  . Years of education: 41  . Highest education level: Not on file  Social Needs  . Financial resource strain: Not on file  . Food insecurity - worry: Not on file  . Food insecurity - inability: Not on file  . Transportation needs - medical: Not on file  . Transportation needs - non-medical: Not on file  Occupational History  . Occupation: N/A  Tobacco Use  . Smoking status: Former Smoker     Types: Cigarettes    Last attempt to quit: 02/10/1988    Years since quitting: 29.6  . Smokeless tobacco: Never Used  Substance and Sexual Activity  . Alcohol use: No  . Drug use: No  . Sexual activity: Not on file    Comment: Married  Other Topics Concern  . Not on file  Social History Narrative   Lives at home w/ wife   Right-handed   Caffeine: 5-6 cups per day      PHYSICAL EXAM  Vitals:   10/01/17 1118  Height: 5\' 2"  (1.575 m)   Body mass index is 27.98 kg/m.  MMSE - Mini Mental State Exam 10/01/2017 03/30/2017 09/30/2016  Orientation to time 3 3 2   Orientation to Place  0 3 3  Registration 3 3 3   Attention/ Calculation 1 1 4   Recall 3 1 2   Language- name 2 objects 2 2 2   Language- repeat 0 0 0  Language- follow 3 step command 3 3 3   Language- read & follow direction 1 1 1   Write a sentence 1 1 1   Copy design 0 1 0  Total score 17 19 21      Generalized: Well developed, in no acute distress   Neurological examination  Mentation: Alert oriented to time, place, history taking. Follows all commands speech and language fluent Cranial nerve II-XII: Pupils were equal round reactive to light. Extraocular movements were full, visual field were full on confrontational test. Facial sensation and strength were normal. Uvula tongue midline. Head turning and shoulder shrug  were normal and symmetric. Motor: The motor testing reveals 5 over 5 strength of all 4 extremities. Good symmetric motor tone is noted throughout.  Sensory: Sensory testing is intact to soft touch on all 4 extremities. No evidence of extinction is noted.  Coordination: Cerebellar testing reveals good finger-nose-finger and heel-to-shin bilaterally.  Gait and station: Gait is normal.    DIAGNOSTIC DATA (LABS, IMAGING, TESTING) - I reviewed patient records, labs, notes, testing and imaging myself where available.  Lab Results  Component Value Date   WBC 5.3 09/01/2017   HGB 13.1 09/01/2017   HCT 39.3  09/01/2017   MCV 89 09/01/2017   PLT 144 (L) 09/01/2017      Component Value Date/Time   NA 140 09/01/2017 1112   K 4.5 09/01/2017 1112   CL 101 09/01/2017 1112   CO2 27 09/01/2017 1112   GLUCOSE 119 (H) 09/01/2017 1112   GLUCOSE 146 (H) 01/17/2016 1342   BUN 21 09/01/2017 1112   CREATININE 1.21 09/01/2017 1112   CREATININE 1.14 02/09/2013 0954   CALCIUM 9.1 09/01/2017 1112   PROT 6.3 09/01/2017 1112   ALBUMIN 4.3 09/01/2017 1112   AST 16 09/01/2017 1112   ALT 18 09/01/2017 1112   ALKPHOS 66 09/01/2017 1112   BILITOT 1.2 09/01/2017 1112   GFRNONAA 59 (L) 09/01/2017 1112   GFRNONAA 65 02/09/2013 0954   GFRAA 68 09/01/2017 1112   GFRAA 75 02/09/2013 0954   Lab Results  Component Value Date   CHOL 167 09/01/2017   HDL 45 09/01/2017   LDLCALC 98 09/01/2017   TRIG 122 09/01/2017   CHOLHDL 3.7 09/01/2017   Lab Results  Component Value Date   HGBA1C 6.5 10/11/2015   Lab Results  Component Value Date   VITAMINB12 CANCELED 09/01/2017   Lab Results  Component Value Date   TSH 2.230 03/28/2014      ASSESSMENT AND PLAN 77 y.o. year old male  has a past medical history of Chronic stable angina (HCC), Coronary artery disease, non-occlusive (01/2012), Depression, Diabetes mellitus, Essential hypertension, Hyperlipidemia with target LDL less than 70, Moderate calcific aortic stenosis (01/2012), Pneumonia, PTSD (post-traumatic stress disorder) - with depression symptoms, Thrombocytopenia (HCC) (04/29/2014), and Vitamin D deficiency. here with:  1.  Memory disturbance  The patient's memory score has declined slightly.  He will continue on Namenda 10 mg twice a day.  He will remain on Aricept 5 mg at bedtime.  I did advise that if diarrhea becomes an issue he can discontinue the medication.  Advised the patient and his wife that if his symptoms worsen or he develops new symptoms they should let us know.  He will follow-up in 6 months or sooner if  needed.  I spent 15 minutes with  the patient. 50% of this time was spent reviewing memory score.     Dean Penny, MSN, NP-C 10/01/2017, 11:32 AM [facility] [address] [employer] [employer], [place] [address] [phone]

## 2017-10-01 NOTE — Progress Notes (Signed)
I have read the note, and I agree with the clinical assessment and plan.  Pammy Vesey K Darian Ace   

## 2017-12-01 ENCOUNTER — Encounter: Payer: Self-pay | Admitting: Nurse Practitioner

## 2017-12-01 ENCOUNTER — Ambulatory Visit: Payer: Medicare Other | Admitting: Nurse Practitioner

## 2017-12-01 VITALS — BP 152/78 | HR 63 | Temp 96.8°F | Ht 64.0 in | Wt 154.0 lb

## 2017-12-01 DIAGNOSIS — I10 Essential (primary) hypertension: Secondary | ICD-10-CM | POA: Diagnosis not present

## 2017-12-01 DIAGNOSIS — G4730 Sleep apnea, unspecified: Secondary | ICD-10-CM

## 2017-12-01 DIAGNOSIS — J42 Unspecified chronic bronchitis: Secondary | ICD-10-CM | POA: Diagnosis not present

## 2017-12-01 DIAGNOSIS — I6523 Occlusion and stenosis of bilateral carotid arteries: Secondary | ICD-10-CM | POA: Diagnosis not present

## 2017-12-01 DIAGNOSIS — I2510 Atherosclerotic heart disease of native coronary artery without angina pectoris: Secondary | ICD-10-CM | POA: Diagnosis not present

## 2017-12-01 DIAGNOSIS — M1009 Idiopathic gout, multiple sites: Secondary | ICD-10-CM

## 2017-12-01 DIAGNOSIS — E1169 Type 2 diabetes mellitus with other specified complication: Secondary | ICD-10-CM

## 2017-12-01 DIAGNOSIS — F339 Major depressive disorder, recurrent, unspecified: Secondary | ICD-10-CM | POA: Diagnosis not present

## 2017-12-01 DIAGNOSIS — E1142 Type 2 diabetes mellitus with diabetic polyneuropathy: Secondary | ICD-10-CM | POA: Diagnosis not present

## 2017-12-01 DIAGNOSIS — Z6830 Body mass index (BMI) 30.0-30.9, adult: Secondary | ICD-10-CM

## 2017-12-01 DIAGNOSIS — I208 Other forms of angina pectoris: Secondary | ICD-10-CM

## 2017-12-01 DIAGNOSIS — I2089 Other forms of angina pectoris: Secondary | ICD-10-CM

## 2017-12-01 DIAGNOSIS — R413 Other amnesia: Secondary | ICD-10-CM

## 2017-12-01 DIAGNOSIS — E785 Hyperlipidemia, unspecified: Secondary | ICD-10-CM

## 2017-12-01 LAB — LIPID PANEL
Chol/HDL Ratio: 3.8 ratio (ref 0.0–5.0)
Cholesterol, Total: 187 mg/dL (ref 100–199)
HDL: 49 mg/dL (ref >39)
LDL Calculated: 113 mg/dL — ABNORMAL HIGH (ref 0–99)
TRIGLYCERIDES: 126 mg/dL (ref 0–149)
VLDL Cholesterol Cal: 25 mg/dL (ref 5–40)

## 2017-12-01 LAB — CMP14+EGFR
ALBUMIN: 4.4 g/dL (ref 3.5–4.8)
ALK PHOS: 65 IU/L (ref 39–117)
ALT: 18 IU/L (ref 0–44)
AST: 16 IU/L (ref 0–40)
Albumin/Globulin Ratio: 2 (ref 1.2–2.2)
BILIRUBIN TOTAL: 0.9 mg/dL (ref 0.0–1.2)
BUN / CREAT RATIO: 18 (ref 10–24)
BUN: 22 mg/dL (ref 8–27)
CHLORIDE: 102 mmol/L (ref 96–106)
CO2: 26 mmol/L (ref 20–29)
Calcium: 9 mg/dL (ref 8.6–10.2)
Creatinine, Ser: 1.2 mg/dL (ref 0.76–1.27)
GFR calc Af Amer: 68 mL/min/{1.73_m2} (ref >59)
GFR calc non Af Amer: 59 mL/min/{1.73_m2} — ABNORMAL LOW (ref >59)
GLOBULIN, TOTAL: 2.2 g/dL (ref 1.5–4.5)
GLUCOSE: 115 mg/dL — AB (ref 65–99)
POTASSIUM: 4.5 mmol/L (ref 3.5–5.2)
SODIUM: 140 mmol/L (ref 134–144)
Total Protein: 6.6 g/dL (ref 6.0–8.5)

## 2017-12-01 LAB — BAYER DCA HB A1C WAIVED: HB A1C (BAYER DCA - WAIVED): 6 % (ref <7.0)

## 2017-12-01 MED ORDER — ISOSORBIDE MONONITRATE ER 30 MG PO TB24: 30.0000 mg | ORAL_TABLET | Freq: Every day | ORAL | 1 refills | Status: DC

## 2017-12-01 MED ORDER — SERTRALINE HCL 100 MG PO TABS: 100.0000 mg | ORAL_TABLET | Freq: Every day | ORAL | 1 refills | Status: DC

## 2017-12-01 MED ORDER — LISINOPRIL 20 MG PO TABS: 20.0000 mg | ORAL_TABLET | Freq: Every day | ORAL | 1 refills | Status: DC

## 2017-12-01 MED ORDER — FLUTICASONE-SALMETEROL 100-50 MCG/DOSE IN AEPB: 1.0000 | INHALATION_SPRAY | Freq: Two times a day (BID) | RESPIRATORY_TRACT | 5 refills | Status: DC

## 2017-12-01 MED ORDER — NITROGLYCERIN 0.4 MG SL SUBL: SUBLINGUAL_TABLET | SUBLINGUAL | 5 refills | Status: DC

## 2017-12-01 MED ORDER — DONEPEZIL HCL 10 MG PO TABS: 5.0000 mg | ORAL_TABLET | Freq: Every day | ORAL | 3 refills | Status: DC

## 2017-12-01 MED ORDER — IPRATROPIUM-ALBUTEROL 20-100 MCG/ACT IN AERS: 1.0000 | INHALATION_SPRAY | Freq: Four times a day (QID) | RESPIRATORY_TRACT | 5 refills | Status: DC

## 2017-12-01 MED ORDER — MEMANTINE HCL 10 MG PO TABS: 10.0000 mg | ORAL_TABLET | Freq: Two times a day (BID) | ORAL | 4 refills | Status: DC

## 2017-12-01 NOTE — Progress Notes (Addendum)
Subjective:    Patient ID: Dean Harris, male    DOB: 05-Mar-1941, 77 y.o.   MRN: [id number]  HPI  Dean Harris is here today for follow up of chronic medical problem.  Outpatient Encounter Medications as of 12/01/2017  Medication Sig  . aspirin 325 MG tablet Take 325 mg by mouth daily.  Marland Kitchen b complex vitamins tablet Take 1 tablet by mouth daily.  . Cholecalciferol (VITAMIN D) 2000 UNITS tablet Take 2,000 Units by mouth daily.   Marland Kitchen donepezil (ARICEPT) 10 MG tablet Take 0.5 tablets (5 mg total) by mouth at bedtime.  . Fluticasone-Salmeterol (ADVAIR DISKUS) 100-50 MCG/DOSE AEPB Inhale 1 puff into the lungs every 12 (twelve) hours.  Marland Kitchen glucose blood (FREESTYLE LITE) test strip Test 1X per day and prn   Dx. E11.9  . Ipratropium-Albuterol (COMBIVENT) 20-100 MCG/ACT AERS respimat Inhale 1 puff into the lungs every 6 (six) hours.  . isosorbide mononitrate (IMDUR) 30 MG 24 hr tablet Take 1 tablet (30 mg total) by mouth daily.  . Lancets (FREESTYLE) lancets Test 1x per day and prn  E11.9  . lisinopril (PRINIVIL,ZESTRIL) 20 MG tablet Take 1 tablet (20 mg total) by mouth daily.  . memantine (NAMENDA) 10 MG tablet Take 1 tablet (10 mg total) by mouth 2 (two) times daily.  . nitroGLYCERIN (NITROSTAT) 0.4 MG SL tablet Place 63 tablets (25 mg total) under the tongue every 5 (five) minutes as needed for chest pain.  Marland Kitchen sertraline (ZOLOFT) 100 MG tablet Take 1 tablet (100 mg total) by mouth daily.  . travoprost, benzalkonium, (TRAVATAN) 0.004 % ophthalmic solution 1 drop at bedtime.     1. Carotid artery plaque, bilateral; s/p Bilateral CEA  Had MR of brain 10/10/16 which showed carotid were open and clear. Had carotid enarctectomy in 2004.  2. Chronic stable angina (HCC)  denies any chest pain or need of nitroglycerin  3. Coronary artery disease, non-occlusive  Last visit with cardiology was 07/22/17. According to office note, no changes were made to plan of care.  4. Essential hypertension, benign    No c/o chest pain, sob or headache. Does not check blood pressure at home. BP Readings from Last 3 Encounters:  12/01/17 (!) 166/72  10/01/17 (!) 120/58  09/01/17 (!) 153/67     5. Chronic bronchitis, unspecified chronic bronchitis type [facility])  Wife say she is doing well with his breathing  6. Sleep apnea, unspecified type  No longer ears CPAP  7. Type 2 diabetes mellitus with diabetic polyneuropathy, without long-term current use of insulin ([place]) last hgba1c was 6.2%. His wife really watches the carbs he eats  8. Hyperlipidemia associated with type 2 diabetes mellitus ([place])  Wife does not cook a lot of fried foods  9. BMI 30.0-30.9,adult  No recent weight changes  10. Idiopathic gout of multiple sites, unspecified chronicity  No gout flare up since last visit  11. Intermittent memory loss  Wife says he is just gradually getting worse    New complaints: None today  Social history: Lives with wife who is his caregiver. She does not leave him alone very often    Review of Systems  Constitutional: Negative for activity change and appetite change.  HENT: Negative.   Eyes: Negative for pain.  Respiratory: Negative for shortness of breath.   Cardiovascular: Negative for chest pain, palpitations and leg swelling.  Gastrointestinal: Negative for abdominal pain.  Endocrine: Negative for polydipsia.  Genitourinary: Negative.   Skin: Negative for rash.  Neurological:  Negative for dizziness, weakness and headaches.  Hematological: Does not bruise/bleed easily.  Psychiatric/Behavioral: Negative.   All other systems reviewed and are negative.      Objective:   Physical Exam  Constitutional: He is oriented to person, place, and time. He appears well-developed and well-nourished.  HENT:  Head: Normocephalic.  Right Ear: External ear normal.  Left Ear: External ear normal.  Nose: Nose normal.  Mouth/Throat: Oropharynx is clear and moist.  Eyes: EOM are normal. Pupils are  equal, round, and reactive to light.  Neck: Normal range of motion. Neck supple. No JVD present. No thyromegaly present.  Cardiovascular: Normal rate, regular rhythm and intact distal pulses. Exam reveals no gallop and no friction rub.  Murmur (2/6 systolic murmur) heard. Pulmonary/Chest: Effort normal and breath sounds normal. No respiratory distress. He has no wheezes. He has no rales. He exhibits no tenderness.  Abdominal: Soft. Bowel sounds are normal. He exhibits no mass. There is no tenderness.  Genitourinary: Prostate normal and penis normal.  Musculoskeletal: Normal range of motion. He exhibits no edema.  Lymphadenopathy:    He has no cervical adenopathy.  Neurological: He is alert and oriented to person, place, and time. No cranial nerve deficit.  Skin: Skin is warm and dry.  Psychiatric: He has a normal mood and affect. His behavior is normal. Judgment and thought content normal.   BP (!) 152/78   Pulse 63   Temp (!) 96.8 F (36 C) (Oral)   Ht '5\' 4"'$  (1.626 m)   Wt 154 lb (69.9 kg)   BMI 26.43 kg/m   HGBA1c 6.0%      Assessment & Plan:  1. Carotid artery plaque, bilateral; s/p Bilateral CEA Will continue to watch  2. Chronic stable angina (HCC) Use nitroglycerin as needed  3. Coronary artery disease, non-occlusive - isosorbide mononitrate (IMDUR) 30 MG 24 hr tablet; Take 1 tablet (30 mg total) by mouth daily.  Dispense: 90 tablet; Refill: 1  4. Essential hypertension, benign Low sodium diet Keep diary of blood pressure at home and let me know if staying above 448 systolic - [id number] - lisinopril (PRINIVIL,ZESTRIL) 20 MG tablet; Take 1 tablet (20 mg total) by mouth daily.  Dispense: 90 tablet; Refill: 1  5. Chronic bronchitis, unspecified chronic bronchitis type ([place]) - Ipratropium-Albuterol (COMBIVENT) 20-100 MCG/ACT AERS respimat; Inhale 1 puff into the lungs every 6 (six) hours.  Dispense: 4 g; Refill: 5 - Fluticasone-Salmeterol (ADVAIR DISKUS) 100-50  MCG/DOSE AEPB; Inhale 1 puff into the lungs every 12 (twelve) hours.  Dispense: 60 each; Refill: 5  6. Sleep apnea, unspecified type Was told at [facility] that he no longer needs CPAP  7. Type 2 diabetes mellitus with diabetic polyneuropathy, without long-term current use of insulin (HCC) Watch carbs in diet - Bayer DCA Hb A1c Waived  8. Hyperlipidemia associated with type 2 diabetes mellitus (HCC) Low fat diet - Lipid panel  9. BMI 30.0-30.9,adult Discussed diet and exercise for person with BMI >25 Will recheck weight in 3-6 months  10. Idiopathic gout of multiple sites, unspecified chronicity  11. Memory disorder Continue games to work on [employer] - memantine (NAMENDA) 10 MG tablet; Take 1 tablet (10 mg total) by mouth 2 (two) times daily.  Dispense: 180 tablet; Refill: 4 - donepezil (ARICEPT) 10 MG tablet; Take 0.5 tablets (5 mg total) by mouth at bedtime.  Dispense: 90 tablet; Refill: 3  12. Depression, recurrent ([place]) Stress management - sertraline (ZOLOFT) 100 MG tablet; Take 1 tablet (100 mg total)  by mouth daily.  Dispense: 90 tablet; Refill: 1    Labs pending Health maintenance reviewed Diet and exercise encouraged Continue all meds Follow up  In 3 months   [place], FNP

## 2017-12-01 NOTE — Patient Instructions (Signed)
Dementia Caregiver Guide Dementia is a term used to describe a number of symptoms that affect memory and thinking. The most common symptoms include:  Memory loss.  Trouble with language and communication.  Trouble concentrating.  Poor judgment.  Problems with reasoning.  Child-like behavior and language.  Extreme anxiety.  Angry outbursts.  Wandering from home or public places.  Dementia usually gets worse slowly over time. In the early stages, people with dementia can stay independent and safe with some help. In later stages, they need help with daily tasks such as dressing, grooming, and using the bathroom. How to help the person with dementia cope Dementia can be frightening and confusing. Here are some tips to help the person with dementia cope with changes caused by the disease. General tips  Keep the person on track with his or her routine.  Try to identify areas where the person may need help.  Be supportive, patient, calm, and encouraging.  Gently remind the person that adjusting to changes takes time.  Help with the tasks that the person has asked for help with.  Keep the person involved in daily tasks and decisions as much as possible.  Encourage conversation, but try not to get frustrated or harried if the person struggles to find words or does not seem to appreciate your help. Communication tips  When the person is talking or seems frustrated, make eye contact and hold the person's hand.  Ask specific questions that need yes or no answers.  Use simple words, short sentences, and a calm voice. Only give one direction at a time.  When offering choices, limit them to just 1 or 2.  Avoid correcting the person in a negative way.  If the person is struggling to find the right words, gently try to help him or her. How to recognize symptoms of stress Symptoms of stress in caregivers include:  Feeling frustrated or angry with the person with  dementia.  Denying that the person has dementia or that his or her symptoms will not improve.  Feeling hopeless and unappreciated.  Difficulty sleeping.  Difficulty concentrating.  Feeling anxious, irritable, or depressed.  Developing stress-related health problems.  Feeling like you have too little time for your own life.  Follow these instructions at home:  Make sure that you and the person you are caring for: ? Get regular sleep. ? Exercise regularly. ? Eat regular, nutritious meals. ? Drink enough fluid to keep your urine clear or pale yellow. ? Take over-the-counter and prescription medicines only as told by your health care providers. ? Attend all scheduled health care appointments.  Join a support group with others who are caregivers.  Ask about respite care resources so that you can have a regular break from the stress of caregiving.  Look for signs of stress in yourself and in the person you are caring for. If you notice signs of stress, take steps to manage it.  Consider any safety risks and take steps to avoid them.  Organize medications in a pill box for each day of the week.  Create a plan to handle any legal or financial matters. Get legal or financial advice if needed.  Keep a calendar in a central location to remind the person of appointments or other activities. Tips for reducing the risk of injury  Keep floors clear of clutter. Remove rugs, magazine racks, and floor lamps.  Keep hallways well lit, especially at night.  Put a handrail and nonslip mat in the bathtub   or shower.  Put childproof locks on cabinets that contain dangerous items, such as medicines, alcohol, guns, toxic cleaning items, sharp tools or utensils, matches, and lighters.  Put the locks in places where the person cannot see or reach them easily. This will help ensure that the person does not wander out of the house and get lost.  Be prepared for emergencies. Keep a list of  emergency phone numbers and addresses in a convenient area.  Remove car keys and lock garage doors so that the person does not try to get in the car and drive.  Have the person wear a bracelet that tracks locations and identifies the person as having memory problems. This should be worn at all times for safety. Where to find support: Many individuals and organizations offer support. These include:  Support groups for people with dementia and for caregivers.  Counselors or therapists.  Home health care services.  Adult day care centers.  Where to find more information: Alzheimer's Association: www.alz.org Contact a health care provider if:  The person's health is rapidly getting worse.  You are no longer able to care for the person.  Caring for the person is affecting your physical and emotional health.  The person threatens himself or herself, you, or anyone else. Summary  Dementia is a term used to describe a number of symptoms that affect memory and thinking.  Dementia usually gets worse slowly over time.  Take steps to reduce the person's risk of injury, and to plan for future care.  Caregivers need support, relief from caregiving, and time for their own lives. This information is not intended to replace advice given to you by your health care provider. Make sure you discuss any questions you have with your health care provider. Document Released: 08/05/2016 Document Revised: 08/05/2016 Document Reviewed: 08/05/2016 Elsevier Interactive Patient Education  2018 [employer]  

## 2017-12-16 ENCOUNTER — Other Ambulatory Visit: Payer: Self-pay

## 2017-12-16 ENCOUNTER — Inpatient Hospital Stay (HOSPITAL_COMMUNITY): Payer: Medicare Other | Attending: Internal Medicine

## 2017-12-16 ENCOUNTER — Other Ambulatory Visit (HOSPITAL_COMMUNITY): Payer: Medicare Other

## 2017-12-16 ENCOUNTER — Inpatient Hospital Stay (HOSPITAL_COMMUNITY): Payer: Medicare Other | Attending: Adult Health | Admitting: Adult Health

## 2017-12-16 ENCOUNTER — Encounter (HOSPITAL_COMMUNITY): Payer: Self-pay | Admitting: Adult Health

## 2017-12-16 ENCOUNTER — Ambulatory Visit (HOSPITAL_COMMUNITY): Payer: Medicare Other | Admitting: Adult Health

## 2017-12-16 VITALS — BP 148/45 | HR 65 | Temp 97.8°F | Resp 16 | Ht 64.0 in | Wt 152.4 lb

## 2017-12-16 DIAGNOSIS — J449 Chronic obstructive pulmonary disease, unspecified: Secondary | ICD-10-CM

## 2017-12-16 DIAGNOSIS — D696 Thrombocytopenia, unspecified: Secondary | ICD-10-CM

## 2017-12-16 DIAGNOSIS — G620 Drug-induced polyneuropathy: Secondary | ICD-10-CM | POA: Insufficient documentation

## 2017-12-16 LAB — CBC WITH DIFFERENTIAL/PLATELET
BASOS ABS: 0.1 10*3/uL (ref 0.0–0.1)
BASOS PCT: 1 %
Eosinophils Absolute: 0.3 10*3/uL (ref 0.0–0.7)
Eosinophils Relative: 6 %
HEMATOCRIT: 40.6 % (ref 39.0–52.0)
HEMOGLOBIN: 13.3 g/dL (ref 13.0–17.0)
Lymphocytes Relative: 44 %
Lymphs Abs: 2.6 10*3/uL (ref 0.7–4.0)
MCH: 28.9 pg (ref 26.0–34.0)
MCHC: 32.8 g/dL (ref 30.0–36.0)
MCV: 88.1 fL (ref 78.0–100.0)
MONOS PCT: 5 %
Monocytes Absolute: 0.3 10*3/uL (ref 0.1–1.0)
NEUTROS ABS: 2.7 10*3/uL (ref 1.7–7.7)
NEUTROS PCT: 44 %
Platelets: 128 10*3/uL — ABNORMAL LOW (ref 150–400)
RBC: 4.61 MIL/uL (ref 4.22–5.81)
RDW: 13.2 % (ref 11.5–15.5)
WBC: 5.9 10*3/uL (ref 4.0–10.5)

## 2017-12-16 NOTE — Progress Notes (Signed)
Dean Harris, [place] [address]   CLINIC:  Medical Oncology/Hematology  PCP:  Chevis Pretty, [place] [address] [phone]   REASON FOR VISIT:  Follow-up for Chronic, mild thrombocytopenia   CURRENT THERAPY: Observation    HISTORY OF PRESENT ILLNESS:     INTERVAL HISTORY:  Dean Harris 77 y.o. male returns for routine follow-up for chronic thrombocytopenia.   Here today with his wife.   Overall, he tells me he has been feeling very well.  Appetite and energy levels both 100%.  He has chronic breathing issues with his COPD, which is largely stable at present.  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 He continues to see his PCP regularly. Also sees cardiology regularly for moderate aortic stenosis.  Has also seen neurology in recent past for memory loss (which has reportedly been a chronic issue for him).   Denies any recent fevers or infections. No new medications. Denies any frank bleeding episodes including blood in his stools, dark/tarry stools, hematuria, nosebleeds, or gingival bleeding.  Denies any abnormal bruising to his trunk/chest, abdomen, or back.  Otherwise, he is largely without other complaints today.    REVIEW OF SYSTEMS:  Review of Systems  Neurological: Positive for numbness.  per HPI. Otherwise 12-point ROS completed and negative except as stated above.    PAST MEDICAL/SURGICAL HISTORY:  Past Medical History:  Diagnosis Date  . Chronic stable angina ([place])   . 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 Depression   . Diabetes mellitus   . Essential hypertension   . Hyperlipidemia with target LDL less than 70   . Moderate calcific aortic stenosis 01/2012   01/2012: Echo - mean/peak gradient12/21 mmHg; 01/2016: Moderate aortic stenosis (mean/peak  gradient 24/35 mmHg)   . Pneumonia    x2  . 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 This has helped his memory issues.  . Thrombocytopenia ([place]) 04/29/2014  . Vitamin D deficiency    Past Surgical History:  Procedure Laterality Date  . CAROTID ENDARTERECTOMY    . LEFT HEART CATHETERIZATION WITH CORONARY ANGIOGRAM N/A 02/13/2012   Procedure: LEFT HEART CATHETERIZATION WITH CORONARY ANGIOGRAM;  Surgeon: Candee Furbish, MD;  Location: [facility] C[facility] LAB;  Service: Cardiovascular: Diffuse mid LAD 50%, 50%pOM1, 50%mRCA -> medical management  . TRANSTHORACIC ECHOCARDIOGRAM  01/2012   EF 55-60%. Mild LVH. No RWMA, mild Aortic Stenosis (mean/peak gradient 13 mmHg/21 mmHg)  . TRANSTHORACIC ECHOCARDIOGRAM  02/07/2016   EF 60-65% with normal wall motion. Normal diastolic function for age. Moderate aortic stenosis (mean/peak gradient 24/35 mmHg)  . US CAROTID DOPPLER BILATERAL ([facility] HX) Bilateral 02/05/2016   Stable mild-to-moderate disease with bilateral heterogeneous plaque. RICA - 7-41%, LICA 28-78%. Bilateral subclavian and vertebral arteries normal.     SOCIAL HISTORY:  Social History   Socioeconomic History  . Marital status: Married    Spouse name: Dean Harris  . Number of children: 1  . Years of education: 87  . Highest education level: Not on file  Occupational History  . Occupation: N/A  Social Needs  . Financial resource strain: Not on file  . Food insecurity:    Worry: Not on file    Inability: Not on file  . Transportation needs:    Medical: Not  on file    Non-medical: Not on file  Tobacco Use  . Smoking status: Former Smoker    Types: Cigarettes    Last attempt to quit: 02/10/1988    Years since quitting: 29.8  . Smokeless tobacco: Never Used  Substance and Sexual Activity  . Alcohol use: No  . Drug use: No  . Sexual activity: Not on file    Comment: Married  Lifestyle  . Physical activity:    Days per week:  Not on file    Minutes per session: Not on file  . Stress: Not on file  Relationships  . Social connections:    Talks on phone: Not on file    Gets together: Not on file    Attends religious service: Not on file    Active member of club or organization: Not on file    Attends meetings of clubs or organizations: Not on file    Relationship status: Not on file  . Intimate partner violence:    Fear of current or ex partner: Not on file    Emotionally abused: Not on file    Physically abused: Not on file    Forced sexual activity: Not on file  Other Topics Concern  . Not on file  Social History Narrative   Lives at home w/ wife   Right-handed   Caffeine: 5-6 cups per day    FAMILY HISTORY:  Family History  Problem Relation Age of Onset  . Diabetes Mother   . Stroke Mother   . Hypertension Mother   . Hyperlipidemia Mother   . Cancer Father   . Hypertension Brother   . Cancer Other     CURRENT MEDICATIONS:  Outpatient Encounter Medications as of 12/16/2017  Medication Sig  . aspirin 325 MG tablet Take 325 mg by mouth daily.  Marland Kitchen b complex vitamins tablet Take 1 tablet by mouth daily.  . Cholecalciferol (VITAMIN D) 2000 UNITS tablet Take 2,000 Units by mouth daily.   Marland Kitchen donepezil (ARICEPT) 10 MG tablet Take 0.5 tablets (5 mg total) by mouth at bedtime.  . Fluticasone-Salmeterol (ADVAIR DISKUS) 100-50 MCG/DOSE AEPB Inhale 1 puff into the lungs every 12 (twelve) hours.  Marland Kitchen glucose blood (FREESTYLE LITE) test strip Test 1X per day and prn   Dx. E11.9  . Ipratropium-Albuterol (COMBIVENT) 20-100 MCG/ACT AERS respimat Inhale 1 puff into the lungs every 6 (six) hours.  . isosorbide mononitrate (IMDUR) 30 MG 24 hr tablet Take 1 tablet (30 mg total) by mouth daily.  . Lancets (FREESTYLE) lancets Test 1x per day and prn  E11.9  . lisinopril (PRINIVIL,ZESTRIL) 20 MG tablet Take 1 tablet (20 mg total) by mouth daily.  . memantine (NAMENDA) 10 MG tablet Take 1 tablet (10 mg total) by mouth 2  (two) times daily.  . nitroGLYCERIN (NITROSTAT) 0.4 MG SL tablet Use 1 tabket under tongue at onset and may repeat every 5 minutes x2  . sertraline (ZOLOFT) 100 MG tablet Take 1 tablet (100 mg total) by mouth daily.  . travoprost, benzalkonium, (TRAVATAN) 0.004 % ophthalmic solution 1 drop at bedtime.   No facility-administered encounter medications on file as of 12/16/2017.     ALLERGIES:  Allergies  Allergen Reactions  . Dust Mite Extract   . Mold Extract [Trichophyton]   . Pollen Extract   . Tetracyclines & Related     Generic only     PHYSICAL EXAM:  ECOG Performance status: 0 - Asymptomatic   Vitals:   12/16/17 1140  BP: (!) 148/45  Pulse: 65  Resp: 16  Temp: 97.8 F (36.6 C)  SpO2: 100%   Filed Weights   12/16/17 1140  Weight: 152 lb 6.4 oz (69.1 kg)    Physical Exam  Constitutional: He is oriented to person, place, and time and well-developed, well-nourished, and in no distress.  HENT:  Head: Normocephalic.  Mouth/Throat: Oropharynx is clear and moist. No oropharyngeal exudate.  Eyes: Pupils are equal, round, and reactive to light. Conjunctivae are normal. No scleral icterus.  Neck: Normal range of motion. Neck supple.  Cardiovascular: Normal rate and regular rhythm.  Murmur (chronic per patient ) heard. Pulmonary/Chest: Effort normal and breath sounds normal. No respiratory distress. He has no wheezes.  Abdominal: Soft. Bowel sounds are normal. There is no tenderness.  Musculoskeletal: Normal range of motion. He exhibits no edema.  Lymphadenopathy:    He has no cervical adenopathy.       Right: No supraclavicular adenopathy present.       Left: No supraclavicular adenopathy present.  Neurological: He is alert and oriented to person, place, and time. No cranial nerve deficit. Gait normal.  Skin: Skin is warm and dry. No rash noted.  Psychiatric: Mood, memory, affect and judgment normal.  Nursing note and vitals reviewed.    LABORATORY DATA:  I have  reviewed the labs as listed.  CBC    Component Value Date/Time   WBC 5.9 12/16/2017 1026   RBC 4.61 12/16/2017 1026   HGB 13.3 12/16/2017 1026   HGB 13.1 09/01/2017 1654   HCT 40.6 12/16/2017 1026   HCT 39.3 09/01/2017 1654   PLT 128 (L) 12/16/2017 1026   PLT 144 (L) 09/01/2017 1654   MCV 88.1 12/16/2017 1026   MCV 89 09/01/2017 1654   MCH 28.9 12/16/2017 1026   MCHC 32.8 12/16/2017 1026   RDW 13.2 12/16/2017 1026   RDW 14.1 09/01/2017 1654   LYMPHSABS 2.6 12/16/2017 1026   LYMPHSABS 2.1 09/01/2017 1654   MONOABS 0.3 12/16/2017 1026   EOSABS 0.3 12/16/2017 1026   EOSABS 0.4 09/01/2017 1654   BASOSABS 0.1 12/16/2017 1026   BASOSABS 0.1 09/01/2017 1654   CMP Latest Ref Rng & Units 12/01/2017 09/01/2017 05/26/2017  Glucose 65 - 99 mg/dL 115(H) 119(H) 97  BUN 8 - 27 mg/dL '22 21 18  '$ Creatinine 0.76 - 1.27 mg/dL 1.20 1.21 1.38(H)  Sodium 134 - 144 mmol/L 140 140 139  Potassium 3.5 - 5.2 mmol/L 4.5 4.5 4.2  Chloride 96 - 106 mmol/L 102 101 96  CO2 20 - 29 mmol/L '26 27 25  '$ Calcium 8.6 - 10.2 mg/dL 9.0 9.1 9.4  Total Protein 6.0 - 8.5 g/dL 6.6 6.3 6.3  Total Bilirubin 0.0 - 1.2 mg/dL 0.9 1.2 1.1  Alkaline Phos 39 - 117 IU/L 65 66 57  AST 0 - 40 IU/L '16 16 21  '$ ALT 0 - 44 IU/L '18 18 20    '$ PENDING LABS:    DIAGNOSTIC IMAGING:    PATHOLOGY:     ASSESSMENT & PLAN:   Chronic, mild thrombocytopenia:  -Labs reviewed in detail today with patient.  Plt count 128,000.  Trends of his platelet counts reviewed as well and have largely been stable for many months.  Denies any frank bleeding episodes, or abnormal bruising to his trunk, abd, or back.   -We discussed possible etiologies of mild thrombocytopenia including medications, chronic liver disease/splenomegaly, infection, immune-mediated conditions, malignancy, or bone marrow disorders.  He is largely asymptomatic and thus we can  continue to monitor at this time. WBCs and Hgb preserved/normal.  I gave him strict instructions to  contact us if he has any abnormal bruising or bleeding episodes, and we could always see him sooner if needed. Additional work-up with bone marrow biopsy, ultrasound abd, etc may be warranted if thrombocytopenia progresses.  For now, continue observation.   -Will check labs only in 6 months.  -Then will have him return to cancer center in 1 year for follow-up with labs.  If his blood work remains stable at that time, then could consider releasing him from additional follow-up/evaluation at the cancer center. Of course, will defer to MD to make this determination at next visit. Patient/wife agree with this plan.      Dispo:  -Labs only in 6 months. (CBC with diff)  -Return to cancer center in 1 year for follow-up with labs. (CBC with diff, CMET)    All questions were answered to patient's stated satisfaction. Encouraged patient to call with any new concerns or questions before his next visit to the cancer center and we can certain see him sooner, if needed.      Orders placed this encounter:  Orders Placed This Encounter  Procedures  . CBC with Differential/Platelet  . Comprehensive metabolic panel      Mike Craze, NP [place] [phone]

## 2017-12-18 ENCOUNTER — Other Ambulatory Visit: Payer: Self-pay | Admitting: Nurse Practitioner

## 2018-01-19 ENCOUNTER — Ambulatory Visit (HOSPITAL_COMMUNITY): Payer: Medicare Other | Attending: Cardiovascular Disease

## 2018-01-19 ENCOUNTER — Other Ambulatory Visit: Payer: Self-pay

## 2018-01-19 DIAGNOSIS — I208 Other forms of angina pectoris: Secondary | ICD-10-CM

## 2018-01-19 DIAGNOSIS — I2510 Atherosclerotic heart disease of native coronary artery without angina pectoris: Secondary | ICD-10-CM | POA: Diagnosis not present

## 2018-01-19 DIAGNOSIS — I10 Essential (primary) hypertension: Secondary | ICD-10-CM | POA: Insufficient documentation

## 2018-01-19 DIAGNOSIS — I25118 Atherosclerotic heart disease of native coronary artery with other forms of angina pectoris: Secondary | ICD-10-CM | POA: Insufficient documentation

## 2018-01-19 DIAGNOSIS — E785 Hyperlipidemia, unspecified: Secondary | ICD-10-CM | POA: Diagnosis not present

## 2018-01-19 DIAGNOSIS — I350 Nonrheumatic aortic (valve) stenosis: Secondary | ICD-10-CM | POA: Insufficient documentation

## 2018-01-19 DIAGNOSIS — Z8701 Personal history of pneumonia (recurrent): Secondary | ICD-10-CM | POA: Diagnosis not present

## 2018-01-19 DIAGNOSIS — E119 Type 2 diabetes mellitus without complications: Secondary | ICD-10-CM | POA: Insufficient documentation

## 2018-01-29 ENCOUNTER — Telehealth: Payer: Self-pay | Admitting: *Deleted

## 2018-01-29 NOTE — Telephone Encounter (Signed)
Left message to call back- in regards to echo results 

## 2018-01-29 NOTE — Telephone Encounter (Signed)
-----   Message from Marykay Lex, MD sent at 01/19/2018  5:44 PM EDT ----- Echocardiogram result: Basically stable AORTIC STENOSIS Normal left ventricle size and function with normal wall motion.  Abnormal relaxation/grade 1 diastolic dysfunction is normal for age. Aortic valve: Moderate stenosis. --> Mean gradient (S): 28 mm Hg. Peak gradient (S): 43 mm Hg. --Only mildly worsened from last year 23 and 44 mmHg  Otherwise normal echo    Bryan Lemma, M.D., M.S. Interventional Cardiologist   Pager # [phone] Phone # [phone] [address] [place], [place] [address]

## 2018-02-11 ENCOUNTER — Encounter: Payer: Self-pay | Admitting: Cardiology

## 2018-02-11 ENCOUNTER — Ambulatory Visit: Payer: Medicare Other | Admitting: Cardiology

## 2018-02-11 VITALS — BP 109/66 | HR 68 | Ht 61.0 in | Wt 140.0 lb

## 2018-02-11 DIAGNOSIS — I208 Other forms of angina pectoris: Secondary | ICD-10-CM | POA: Diagnosis not present

## 2018-02-11 DIAGNOSIS — I350 Nonrheumatic aortic (valve) stenosis: Secondary | ICD-10-CM | POA: Diagnosis not present

## 2018-02-11 DIAGNOSIS — E785 Hyperlipidemia, unspecified: Secondary | ICD-10-CM | POA: Diagnosis not present

## 2018-02-11 DIAGNOSIS — E1169 Type 2 diabetes mellitus with other specified complication: Secondary | ICD-10-CM | POA: Diagnosis not present

## 2018-02-11 DIAGNOSIS — I2510 Atherosclerotic heart disease of native coronary artery without angina pectoris: Secondary | ICD-10-CM | POA: Diagnosis not present

## 2018-02-11 DIAGNOSIS — I10 Essential (primary) hypertension: Secondary | ICD-10-CM

## 2018-02-11 DIAGNOSIS — I6523 Occlusion and stenosis of bilateral carotid arteries: Secondary | ICD-10-CM

## 2018-02-11 NOTE — Patient Instructions (Signed)
No changes with medications     Your physician wants you to follow-up in 12 months with  DR HARDING.You will receive a reminder letter in the mail two months in advance. If you don't receive a letter, please call our office to schedule the follow-up appointment.

## 2018-02-11 NOTE — Progress Notes (Signed)
PCP: Bennie Pierini, FNP  Clinic Note: Chief Complaint  Patient presents with  . Follow-up    Pt states no Sx  . Aortic Stenosis  . Coronary Artery Disease    Nonocclusive    HPI: Dean Harris is a 77 y.o. male with a PMH below who presents today for Six-month follow-up for CAD with chronic stable angina as well as Moderate AS by Echo. He has a chronic pulmonary condition Status post bilateral carotid endarterectomy after an episode of amaurosis fugax in 2004. .Prior to my seeing him, he been evaluated by Dr. Katrinka Blazing for chest pain in May 2013. His cardiac catheterization showed diffuse 50% disease in the mid LAD as well as mid RCA and proximal OM 1. Normal EF with moderately elevated LVEDP of . --He has some baseline mild dementia issues and relatively poor historian.  He gets easily frazzled and has a hard time fully answering questions and looks to his wife to answer.  Dean Harris was last seen in May 2018 - doing OK.  Had Echo ordered To follow-up aortic stenosis.   Hospitalizations: None  Studies Personally Reviewed - if available, images/films reviewed: From Epic Chart or Care Everywhere  Echo May 2017: 60-65%. NO RWMA. Mod AS  Echo May 2019: (Stable) moderate LVH.  EF 60-65%.  GR to DD.  Moderate aortic stenosis.   Interval History: Dean Harris returns today overall doing fairly well.  He is able to do his gardening chores & some short walks without any SSx of shortness of breath or chest discomfort.  He does have stable baseline DOE, but denies any exacerbation. No heart failure symptoms of PND, orthopnea or edema.  No rapid irregular heartbeats or palpitations.  No syncope/near syncope or TIA/amaurosis fugax.  No claudication.   ROS: A comprehensive was performed. Pertinent positives and negatives noted above Review of Systems  Constitutional: Negative for malaise/fatigue and weight loss.  HENT: Positive for congestion (Associated with allergies.). Negative  for nosebleeds.   Respiratory: Negative for cough, shortness of breath (Baseline SOB due to lung disease) and wheezing.   Gastrointestinal: Negative for blood in stool, constipation, heartburn and melena.  Genitourinary: Negative for hematuria.  Musculoskeletal: Negative for falls and joint pain.  Neurological: Positive for dizziness (Sometimes with position change).  Endo/Heme/Allergies: Positive for environmental allergies.  Psychiatric/Behavioral: Positive for memory loss. Negative for depression.  All other systems reviewed and are negative.  I have reviewed and (if needed) personally updated the patient's problem list, medications, allergies, past medical and surgical history, social and family history.   Past Medical History:  Diagnosis Date  . Chronic stable angina ([facility])   . 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 Depression   . Diabetes mellitus   . Essential hypertension   . Hyperlipidemia with target LDL less than 70   . 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 Pneumonia    x2  . PTSD (post-traumatic stress disorder) - with depression symptoms    Recently started on medication by the [place]. This has helped his memory issues.  . Thrombocytopenia ([facility]) 04/29/2014  . Vitamin D deficiency     Past Surgical History:  Procedure Laterality Date  . CAROTID ENDARTERECTOMY    . LEFT HEART CATHETERIZATION WITH CORONARY ANGIOGRAM N/A 02/13/2012   Procedure: LEFT HEART CATHETERIZATION WITH CORONARY ANGIOGRAM;  Surgeon: Donato Schultz, MD;  Location: [facility] CATH LAB;  Service: Cardiovascular: Diffuse mid LAD 50%, 50%pOM1, 50%mRCA -> medical management  . TRANSTHORACIC ECHOCARDIOGRAM  01/2012   EF 55-60%. Mild LVH. No RWMA, mild Aortic Stenosis (mean/peak gradient 13 mmHg/21 mmHg)  . TRANSTHORACIC ECHOCARDIOGRAM  May  2017, May 2019   A) EF 60-65% w/o RWMA. Normal DF for age. Mod AD (mean/peak gradient 24/35 mmHg); b) EF 60-65%, mod LVH, Mod AS (Mean gradient (S): 28 mm Hg. Peak 43 mmHg  . US CAROTID DOPPLER BILATERAL ([facility] HX) Bilateral 02/05/2016   Stable mild-to-moderate disease with bilateral heterogeneous plaque. RICA - 1-39%, LICA 40-59%. Bilateral subclavian and vertebral arteries normal.    Current Meds  Medication Sig  . ACCU-CHEK AVIVA PLUS test strip USE 1 STRIP TO CHECK GLUCOSE ONCE DAILY AND AS NEEDED  . ACCU-CHEK SOFTCLIX LANCETS lancets USE 1  TO CHECK GLUCOSE ONCE DAILY AND AS NEEDED  . aspirin 325 MG tablet Take 325 mg by mouth daily.  Marland Kitchen b complex vitamins tablet Take 1 tablet by mouth daily.  . Cholecalciferol (VITAMIN D) 2000 UNITS tablet Take 2,000 Units by mouth daily.   Marland Kitchen donepezil (ARICEPT) 10 MG tablet Take 0.5 tablets (5 mg total) by mouth at bedtime.  . Fluticasone-Salmeterol (ADVAIR DISKUS) 100-50 MCG/DOSE AEPB Inhale 1 puff into the lungs every 12 (twelve) hours.  . Ipratropium-Albuterol (COMBIVENT) 20-100 MCG/ACT AERS respimat Inhale 1 puff into the lungs every 6 (six) hours.  . isosorbide mononitrate (IMDUR) 30 MG 24 hr tablet Take 1 tablet (30 mg total) by mouth daily.  Marland Kitchen lisinopril (PRINIVIL,ZESTRIL) 20 MG tablet Take 1 tablet (20 mg total) by mouth daily.  . memantine (NAMENDA) 10 MG tablet Take 1 tablet (10 mg total) by mouth 2 (two) times daily.  . nitroGLYCERIN (NITROSTAT) 0.4 MG SL tablet Use 1 tabket under tongue at onset and may repeat every 5 minutes x2  . sertraline (ZOLOFT) 100 MG tablet Take 1 tablet (100 mg total) by mouth daily.  . travoprost, benzalkonium, (TRAVATAN) 0.004 % ophthalmic solution 1 drop at bedtime.    Allergies  Allergen Reactions  . Dust Mite Extract   . Mold Extract [Trichophyton]   . Pollen Extract   . Tetracyclines & Related     Generic only    Social History   Tobacco Use  . Smoking status: Former Smoker    Types: Cigarettes     Last attempt to quit: 02/10/1988    Years since quitting: 30.0  . Smokeless tobacco: Never Used  Substance Use Topics  . Alcohol use: No  . Drug use: No   Social History   Social History Narrative   Lives at home w/ wife   Right-handed   Caffeine: 5-6 cups per day     family history includes Cancer in his father and other; Diabetes in his mother; Hyperlipidemia in his mother; Hypertension in his brother and mother; Stroke in his mother.  Wt Readings from Last 3 Encounters:  02/11/18 140 lb (63.5 kg)  12/16/17 152 lb 6.4 oz (69.1 kg)  12/01/17 154 lb (69.9 kg)    PHYSICAL EXAM BP 109/66   Pulse 68   Ht  (1.549 m)   Wt 140 lb (63.5 kg)   BMI 26.45 kg/m   Physical Exam  Constitutional: He is oriented to person, place, and time. He appears well-developed and well-nourished. No distress.  Heard of hearing.  Slow response to questions - easily flustered.  HENT:  Head: Normocephalic and  atraumatic.  Neck: Normal range of motion. Neck supple. No hepatojugular reflux and no JVD present. Carotid bruit is not present.  Cardiovascular: Normal rate, regular rhythm and normal pulses.  No extrasystoles are present. PMI is not displaced. Exam reveals no gallop.  Murmur heard. High-pitched harsh crescendo-decrescendo midsystolic murmur is present with a grade of 3/6 at the upper right sternal border radiating to the neck. Pulmonary/Chest: Effort normal and breath sounds normal. No respiratory distress. He has no wheezes. He has no rales.  Abdominal: Soft. Bowel sounds are normal. He exhibits no distension. There is no tenderness. There is no rebound.  Musculoskeletal: Normal range of motion. He exhibits no edema.  Neurological: He is alert and oriented to person, place, and time.  Psychiatric: He has a normal mood and affect. His behavior is normal. Judgment and thought content normal.    Adult ECG Report None  Other studies Reviewed: Additional studies/ records that were  reviewed today include:  Recent Labs:   Lab Results  Component Value Date   CHOL 187 12/01/2017   HDL 49 12/01/2017   LDLCALC 113 (H) 12/01/2017   TRIG 126 12/01/2017   CHOLHDL 3.8 12/01/2017   Lab Results  Component Value Date   CREATININE 1.20 12/01/2017   BUN 22 12/01/2017   NA 140 12/01/2017   K 4.5 12/01/2017   CL 102 12/01/2017   CO2 26 12/01/2017   ASSESSMENT / PLAN: Problem List Items Addressed This Visit    Moderate aortic stenosis (Chronic)    Slight progression in gradients from 2017.  Still okay to check every 2 years.  Discussed concerning symptoms such as angina, heart failure and syncope. Continue blood pressure management and lipid control.      Hyperlipidemia associated with type 2 diabetes mellitus (HCC) - Primary (Chronic)    Interestingly, his cholesterol is not as well controlled as of March of this year ever since his statin was stopped due to memory issues.  May consider Livalo -if continues to be worse, would consider PCSK9 inhibitors.      Essential hypertension, benign (Chronic)    Blood pressure stable.  He is on lisinopril and Imdur.      Coronary artery disease, non-occlusive (Chronic)    He only a moderate disease several years ago by had heart catheterization, and has not had any exacerbation of symptoms.  Continue to follow.  Not on beta-blocker because of lung disease and soft pressures with relatively low heart rate at baseline.  He is on aspirin statin and Imdur.      Chronic stable angina ([facility]) (Chronic)    Nonischemic CAD noted on cath -microvascular disease suspected, however he seems to be doing fine with Imdur .      Carotid artery plaque, bilateral; s/p Bilateral CEA (Chronic)      Current medicines are reviewed at length with the patient today. (+/- concerns) None The following changes have been made: None  Patient Instructions  No changes with medications     Your physician wants you to follow-up in 12 months with   DR HARDING.You will receive a reminder letter in the mail two months in advance. If you don't receive a letter, please call our office to schedule the follow-up appointment.      Studies Ordered:   No orders of the defined types were placed in this encounter.     Bryan Lemma, M.D., M.S. Interventional Cardiologist   Pager # [phone] Phone # [phone] [address] [place],  [place] [address]

## 2018-02-14 ENCOUNTER — Encounter: Payer: Self-pay | Admitting: Cardiology

## 2018-02-14 NOTE — Assessment & Plan Note (Signed)
Slight progression in gradients from 2017.  Still okay to check every 2 years.  Discussed concerning symptoms such as angina, heart failure and syncope. Continue blood pressure management and lipid control.

## 2018-02-14 NOTE — Assessment & Plan Note (Signed)
He only a moderate disease several years ago by had heart catheterization, and has not had any exacerbation of symptoms.  Continue to follow.  Not on beta-blocker because of lung disease and soft pressures with relatively low heart rate at baseline.  He is on aspirin statin and Imdur.

## 2018-02-14 NOTE — Assessment & Plan Note (Signed)
Nonischemic CAD noted on cath -microvascular disease suspected, however he seems to be doing fine with Imdur .

## 2018-02-14 NOTE — Assessment & Plan Note (Signed)
Blood pressure stable.  He is on lisinopril and Imdur.

## 2018-02-14 NOTE — Assessment & Plan Note (Signed)
Interestingly, his cholesterol is not as well controlled as of March of this year ever since his statin was stopped due to memory issues.  May consider Livalo -if continues to be worse, would consider PCSK9 inhibitors.

## 2018-02-19 NOTE — Telephone Encounter (Signed)
SPOKE TO WIFE 01/24/18 RESULT GIVEN

## 2018-03-04 ENCOUNTER — Encounter: Payer: Medicare Other | Admitting: Nurse Practitioner

## 2018-03-09 ENCOUNTER — Encounter: Payer: Self-pay | Admitting: Nurse Practitioner

## 2018-03-09 ENCOUNTER — Ambulatory Visit: Payer: Medicare Other | Admitting: Nurse Practitioner

## 2018-03-09 VITALS — BP 131/68 | HR 58 | Temp 97.0°F | Ht 61.0 in | Wt 156.0 lb

## 2018-03-09 DIAGNOSIS — I10 Essential (primary) hypertension: Secondary | ICD-10-CM | POA: Diagnosis not present

## 2018-03-09 DIAGNOSIS — E1140 Type 2 diabetes mellitus with diabetic neuropathy, unspecified: Secondary | ICD-10-CM

## 2018-03-09 DIAGNOSIS — G4730 Sleep apnea, unspecified: Secondary | ICD-10-CM

## 2018-03-09 DIAGNOSIS — F339 Major depressive disorder, recurrent, unspecified: Secondary | ICD-10-CM | POA: Diagnosis not present

## 2018-03-09 DIAGNOSIS — I2510 Atherosclerotic heart disease of native coronary artery without angina pectoris: Secondary | ICD-10-CM

## 2018-03-09 DIAGNOSIS — J42 Unspecified chronic bronchitis: Secondary | ICD-10-CM

## 2018-03-09 DIAGNOSIS — D696 Thrombocytopenia, unspecified: Secondary | ICD-10-CM | POA: Diagnosis not present

## 2018-03-09 DIAGNOSIS — M1009 Idiopathic gout, multiple sites: Secondary | ICD-10-CM | POA: Diagnosis not present

## 2018-03-09 DIAGNOSIS — Z6830 Body mass index (BMI) 30.0-30.9, adult: Secondary | ICD-10-CM

## 2018-03-09 DIAGNOSIS — I6523 Occlusion and stenosis of bilateral carotid arteries: Secondary | ICD-10-CM | POA: Diagnosis not present

## 2018-03-09 DIAGNOSIS — E1142 Type 2 diabetes mellitus with diabetic polyneuropathy: Secondary | ICD-10-CM | POA: Diagnosis not present

## 2018-03-09 DIAGNOSIS — R413 Other amnesia: Secondary | ICD-10-CM | POA: Diagnosis not present

## 2018-03-09 DIAGNOSIS — E1169 Type 2 diabetes mellitus with other specified complication: Secondary | ICD-10-CM | POA: Diagnosis not present

## 2018-03-09 DIAGNOSIS — E785 Hyperlipidemia, unspecified: Secondary | ICD-10-CM

## 2018-03-09 LAB — BAYER DCA HB A1C WAIVED: HB A1C (BAYER DCA - WAIVED): 6.3 % (ref <7.0)

## 2018-03-09 MED ORDER — IPRATROPIUM-ALBUTEROL 20-100 MCG/ACT IN AERS: 1.0000 | INHALATION_SPRAY | Freq: Four times a day (QID) | RESPIRATORY_TRACT | 5 refills | Status: DC

## 2018-03-09 MED ORDER — SERTRALINE HCL 100 MG PO TABS: 100.0000 mg | ORAL_TABLET | Freq: Every day | ORAL | 1 refills | Status: DC

## 2018-03-09 MED ORDER — ISOSORBIDE MONONITRATE ER 30 MG PO TB24: 30.0000 mg | ORAL_TABLET | Freq: Every day | ORAL | 1 refills | Status: DC

## 2018-03-09 MED ORDER — DONEPEZIL HCL 10 MG PO TABS: 5.0000 mg | ORAL_TABLET | Freq: Every day | ORAL | 1 refills | Status: DC

## 2018-03-09 MED ORDER — MEMANTINE HCL 10 MG PO TABS: 10.0000 mg | ORAL_TABLET | Freq: Two times a day (BID) | ORAL | 1 refills | Status: DC

## 2018-03-09 MED ORDER — FLUTICASONE-SALMETEROL 100-50 MCG/DOSE IN AEPB: 1.0000 | INHALATION_SPRAY | Freq: Two times a day (BID) | RESPIRATORY_TRACT | 5 refills | Status: DC

## 2018-03-09 MED ORDER — LISINOPRIL 20 MG PO TABS: 20.0000 mg | ORAL_TABLET | Freq: Every day | ORAL | 1 refills | Status: DC

## 2018-03-09 NOTE — Progress Notes (Addendum)
Subjective:    Patient ID: Dean Harris, male    DOB: [birth year], 77 y.o.   MRN: [id number]   Chief Complaint: Medical Management of Chronic Issues   HPI:  1. Type 2 diabetes mellitus with diabetic polyneuropathy, without long-term current use of insulin ([place]) last hgba1c was 6.0. Blood sugars are running below 120 on average. No hypoglycemia that he is awareof.   2. Hyperlipidemia associated with type 2 diabetes mellitus ([place])  Wife does not feed him a lot of fried foods..dr. Ellyn Hack wants his LDL lower but doesnot want him on statin riht now.  3. Essential hypertension, benign  No c/o chest pain, sob or headache. Does not check blood pressure at home. BP Readings from Last 3 Encounters:  03/09/18 131/68  02/11/18 109/66  12/16/17 (!) 148/45     4. Carotid artery plaque, bilateral; s/p Bilateral CEA  Just saw cardiology on 02/11/18. No change to plan of care and can go 2 years before being seen again. His last carotid echo was stable.  5. Sleep apnea, unspecified type  Wears CPAP nightly. Feels rested in mornings  6. Chronic bronchitis, unspecified chronic bronchitis type ([place])  denies cough or SOB  7. Other polyneuropathy   Has chronic numbness and tingling in bil feet. His wife checks his feet daily.  8. Thrombocytopenia ([place])  Last platlet count was 128 but is stable- no excessive bruising  9. Memory disorder  Wife says he is slowly worsening. Has trouble reading and following directions.  10. Idiopathic gout of multiple sites, unspecified chronicity  No recent flare ups.  11. Depression, recurrent ([place])  Is currently on zoloftdaily- wife says he is doing well. Depression screen [facility] 2/9 03/09/2018 12/01/2017 09/01/2017  Decreased Interest 0 0 0  Down, Depressed, Hopeless 0 0 0  PHQ - 2 Score 0 0 0     12. BMI 30.0-30.9,adult  No recent weight changes    Outpatient Encounter Medications as of 03/09/2018  Medication Sig  . ACCU-CHEK AVIVA PLUS test strip USE 1  STRIP TO CHECK GLUCOSE ONCE DAILY AND AS NEEDED  . ACCU-CHEK SOFTCLIX LANCETS lancets USE 1  TO CHECK GLUCOSE ONCE DAILY AND AS NEEDED  . aspirin 325 MG tablet Take 325 mg by mouth daily.  Marland Kitchen b complex vitamins tablet Take 1 tablet by mouth daily.  . Cholecalciferol (VITAMIN D) 2000 UNITS tablet Take 2,000 Units by mouth daily.   Marland Kitchen donepezil (ARICEPT) 10 MG tablet Take 0.5 tablets (5 mg total) by mouth at bedtime.  . Fluticasone-Salmeterol (ADVAIR DISKUS) 100-50 MCG/DOSE AEPB Inhale 1 puff into the lungs every 12 (twelve) hours.  . Ipratropium-Albuterol (COMBIVENT) 20-100 MCG/ACT AERS respimat Inhale 1 puff into the lungs every 6 (six) hours.  . isosorbide mononitrate (IMDUR) 30 MG 24 hr tablet Take 1 tablet (30 mg total) by mouth daily.  Marland Kitchen lisinopril (PRINIVIL,ZESTRIL) 20 MG tablet Take 1 tablet (20 mg total) by mouth daily.  . memantine (NAMENDA) 10 MG tablet Take 1 tablet (10 mg total) by mouth 2 (two) times daily.  . nitroGLYCERIN (NITROSTAT) 0.4 MG SL tablet Use 1 tabket under tongue at onset and may repeat every 5 minutes x2  . sertraline (ZOLOFT) 100 MG tablet Take 1 tablet (100 mg total) by mouth daily.  . travoprost, benzalkonium, (TRAVATAN) 0.004 % ophthalmic solution 1 drop at bedtime.       New complaints: None today  Social history: Wife is his main caregiver.    Review of Systems  Constitutional:  Negative for activity change and appetite change.  HENT: Negative.   Eyes: Negative for pain.  Respiratory: Negative for shortness of breath.   Cardiovascular: Negative for chest pain, palpitations and leg swelling.  Gastrointestinal: Negative for abdominal pain.  Endocrine: Negative for polydipsia.  Genitourinary: Negative.   Skin: Negative for rash.  Neurological: Negative for dizziness, weakness and headaches.  Hematological: Does not bruise/bleed easily.  Psychiatric/Behavioral: Negative.   All other systems reviewed and are negative.      Objective:   Physical  Exam  Constitutional: He is oriented to person, place, and time. He appears well-developed and well-nourished. No distress.  HENT:  Head: Normocephalic.  Nose: Nose normal.  Mouth/Throat: Oropharynx is clear and moist.  Eyes: Pupils are equal, round, and reactive to light. EOM are normal.  Neck: Normal range of motion and phonation normal. Neck supple. No JVD present. Carotid bruit is not present. No thyroid mass and no thyromegaly present.  Cardiovascular: Normal rate and regular rhythm.  Murmur heard. bil carotid bruits  Pulmonary/Chest: Effort normal and breath sounds normal. No respiratory distress.  Abdominal: Soft. Normal appearance, normal aorta and bowel sounds are normal. There is no tenderness.  Musculoskeletal: Normal range of motion.  Lymphadenopathy:    He has no cervical adenopathy.  Neurological: He is alert and oriented to person, place, and time.  Skin: Skin is warm and dry.  Psychiatric: He has a normal mood and affect. His behavior is normal. Judgment and thought content normal.   BP 131/68   Pulse (!) 58   Temp (!) 97 F (36.1 C) (Oral)   Ht '5\' 1"'  (1.549 m)   Wt 156 lb (70.8 kg)   BMI 29.48 kg/m   hgba1c 6.3% today     Assessment & Plan:  Dean Harris comes in today with chief complaint of Medical Management of Chronic Issues   Diagnosis and orders addressed:  1. Type 2 diabetes mellitus with diabetic polyneuropathy, without long-term current use of insulin (HCC) Continue to watch carbs in diet - Bayer DCA Hb A1c Waived  2. Hyperlipidemia associated with type 2 diabetes mellitus (HCC) Low fat diet - Lipid panel  3. Essential hypertension, benign Low sodium diet - CMP14+EGFR - lisinopril (PRINIVIL,ZESTRIL) 20 MG tablet; Take 1 tablet (20 mg total) by mouth daily.  Dispense: 90 tablet; Refill: 1  4. Carotid artery plaque, bilateral; s/p Bilateral CEA Keep follow up with cardiology Doppler study due in 2 years  5. Sleep apnea, unspecified  type Bedtime routine  6. Chronic bronchitis, unspecified chronic bronchitis type ([place]) - Ipratropium-Albuterol (COMBIVENT) 20-100 MCG/ACT AERS respimat; Inhale 1 puff into the lungs every 6 (six) hours.  Dispense: 4 g; Refill: 5 - Fluticasone-Salmeterol (ADVAIR DISKUS) 100-50 MCG/DOSE AEPB; Inhale 1 puff into the lungs every 12 (twelve) hours.  Dispense: 60 each; Refill: 5  7. 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 Thrombocytopenia ([place]) Will keep check of platelets rto if develops a petechial rash or excessive bruising  9. Memory disorder Continue to orient daily - donepezil (ARICEPT) 10 MG tablet; Take 0.5 tablets (5 mg total) by mouth at bedtime.  Dispense: 90 tablet; Refill: 1 - memantine (NAMENDA) 10 MG tablet; Take 1 tablet (10 mg total) by mouth 2 (two) times daily.  Dispense: 180 tablet; Refill: 1  10. Idiopathic gout of multiple sites, unspecified chronicity  11. Depression, recurrent ([place]) - sertraline (ZOLOFT) 100 MG tablet; Take 1  tablet (100 mg total) by mouth daily.  Dispense: 90 tablet; Refill: 1  12. BMI 30.0-30.9,adult Discussed diet and exercise for person with BMI >25 Will recheck weight in 3-6 months  13. Coronary artery disease, non-occlusive Keep follow up with crdiology - isosorbide mononitrate (IMDUR) 30 MG 24 hr tablet; Take 1 tablet (30 mg total) by mouth daily.  Dispense: 90 tablet; Refill: 1   Labs pending Health Maintenance reviewed Diet and exercise encouraged  Follow up plan: 3 months   Mary-Margaret Hassell Done, FNP

## 2018-03-09 NOTE — Patient Instructions (Signed)

## 2018-03-10 LAB — CMP14+EGFR
ALT: 15 IU/L (ref 0–44)
AST: 16 IU/L (ref 0–40)
Albumin/Globulin Ratio: 2.1 (ref 1.2–2.2)
Albumin: 4.1 g/dL (ref 3.5–4.8)
Alkaline Phosphatase: 60 IU/L (ref 39–117)
BUN/Creatinine Ratio: 16 (ref 10–24)
BUN: 20 mg/dL (ref 8–27)
Bilirubin Total: 0.8 mg/dL (ref 0.0–1.2)
CO2: 24 mmol/L (ref 20–29)
CREATININE: 1.24 mg/dL (ref 0.76–1.27)
Calcium: 8.6 mg/dL (ref 8.6–10.2)
Chloride: 101 mmol/L (ref 96–106)
GFR calc Af Amer: 66 mL/min/{1.73_m2} (ref >59)
GFR calc non Af Amer: 57 mL/min/{1.73_m2} — ABNORMAL LOW (ref >59)
GLUCOSE: 111 mg/dL — AB (ref 65–99)
Globulin, Total: 2 g/dL (ref 1.5–4.5)
Potassium: 4.3 mmol/L (ref 3.5–5.2)
Sodium: 138 mmol/L (ref 134–144)
Total Protein: 6.1 g/dL (ref 6.0–8.5)

## 2018-03-10 LAB — LIPID PANEL
CHOLESTEROL TOTAL: 169 mg/dL (ref 100–199)
Chol/HDL Ratio: 4 ratio (ref 0.0–5.0)
HDL: 42 mg/dL (ref >39)
LDL CALC: 104 mg/dL — AB (ref 0–99)
TRIGLYCERIDES: 116 mg/dL (ref 0–149)
VLDL CHOLESTEROL CAL: 23 mg/dL (ref 5–40)

## 2018-03-31 ENCOUNTER — Other Ambulatory Visit: Payer: Self-pay | Admitting: *Deleted

## 2018-03-31 MED ORDER — GLUCOSE BLOOD VI STRP: ORAL_STRIP | 3 refills | Status: DC

## 2018-04-12 ENCOUNTER — Ambulatory Visit: Payer: Medicare Other | Admitting: Adult Health

## 2018-05-26 ENCOUNTER — Other Ambulatory Visit: Payer: Self-pay

## 2018-05-26 ENCOUNTER — Encounter: Payer: Self-pay | Admitting: Adult Health

## 2018-05-26 ENCOUNTER — Ambulatory Visit: Payer: Medicare Other | Admitting: Adult Health

## 2018-05-26 VITALS — BP 144/68 | HR 71 | Resp 18 | Ht 61.0 in | Wt 154.0 lb

## 2018-05-26 DIAGNOSIS — R413 Other amnesia: Secondary | ICD-10-CM | POA: Diagnosis not present

## 2018-05-26 NOTE — Patient Instructions (Signed)
Your Plan:  Continue Namenda Stop Aricept due to Diarrhea Memory score has declined. We will continue to monitor   Thank you for coming to see [place] at [facility]. I hope we have been able to provide you high quality care today.  You may receive a patient satisfaction survey over the next few weeks. We would appreciate your feedback and comments so that we may continue to improve ourselves and the health of our patients.

## 2018-05-26 NOTE — Progress Notes (Signed)
PATIENT: Dean Harris DOB: 1941-04-02  REASON FOR VISIT: follow up HISTORY FROM: patient  HISTORY OF PRESENT ILLNESS: Today 05/26/18: Dean Harris is a 77 year old male with a history of memory disturbance.  He returns today for follow-up.  He lives at home with his wife.  He continues on Aricept and Namenda.  His wife reports that he has continued to have diarrhea although she is unsure if it is related to Aricept.  Patient is able to complete all ADLs independently.  He does not operate a motor vehicle.  Denies any trouble with his sleep.  Wife reports occasionally he will get agitated.  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  She has noticed in the last 8 months his memory has declined.  He returns today for evaluation.  HISTORY 10/01/17 Dean Harris is a 77 year old male with a history of memory disturbance.  He returns today for follow-up.  At the last visit he was having issues with diarrhea therefore we decreased Aricept down to 5 mg at bedtime.  He reports that his diarrhea has decreased in frequency.  He reports that he may have one episode a week.  He is on Namenda 10 mg twice a day.  He reports that he is tolerating this well.  He is able to complete most ADLs independently.  Denies any trouble sleeping.  Reports a good appetite.  His wife reports that he is a little more agitated between 1 and 4 PM.  She states that typically she is the trigger for his agitation.  He returns today for an evaluation.  REVIEW OF SYSTEMS: Out of a complete 14 system review of symptoms, the patient complains only of the following symptoms, and all other reviewed systems are negative.  See HPI  ALLERGIES: Allergies  Allergen Reactions  . Dust Mite Extract   . Mold Extract [Trichophyton]   . Pollen Extract   . Tetracyclines & Related     Generic only    HOME MEDICATIONS: Outpatient Medications Prior to Visit  Medication Sig Dispense Refill  . ACCU-CHEK SOFTCLIX  LANCETS lancets USE 1  TO CHECK GLUCOSE ONCE DAILY AND AS NEEDED 200 each 3  . aspirin 325 MG tablet Take 325 mg by mouth daily.    Marland Kitchen b complex vitamins tablet Take 1 tablet by mouth daily.    . Cholecalciferol (VITAMIN D) 2000 UNITS tablet Take 2,000 Units by mouth daily.     Marland Kitchen donepezil (ARICEPT) 10 MG tablet Take 0.5 tablets (5 mg total) by mouth at bedtime. 90 tablet 1  . Fluticasone-Salmeterol (ADVAIR DISKUS) 100-50 MCG/DOSE AEPB Inhale 1 puff into the lungs every 12 (twelve) hours. 60 each 5  . glucose blood (ACCU-CHEK AVIVA PLUS) test strip USE 1 STRIP TO CHECK GLUCOSE ONCE DAILY AND AS NEEDED 100 each 3  . Ipratropium-Albuterol (COMBIVENT) 20-100 MCG/ACT AERS respimat Inhale 1 puff into the lungs every 6 (six) hours. 4 g 5  . isosorbide mononitrate (IMDUR) 30 MG 24 hr tablet Take 1 tablet (30 mg total) by mouth daily. 90 tablet 1  . lisinopril (PRINIVIL,ZESTRIL) 20 MG tablet Take 1 tablet (20 mg total) by mouth daily. 90 tablet 1  . memantine (NAMENDA) 10 MG tablet Take 1 tablet (10 mg total) by mouth 2 (two) times daily. 180 tablet 1  . nitroGLYCERIN (NITROSTAT) 0.4 MG SL tablet Use 1 tabket under tongue at onset and may repeat every 5 minutes x2 25 tablet 5  .  sertraline (ZOLOFT) 100 MG tablet Take 1 tablet (100 mg total) by mouth daily. 90 tablet 1  . travoprost, benzalkonium, (TRAVATAN) 0.004 % ophthalmic solution 1 drop at bedtime.     No facility-administered medications prior to visit.     PAST MEDICAL HISTORY: Past Medical History:  Diagnosis Date  . Chronic stable angina (HCC)   . 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 Depression   . Diabetes mellitus   . Essential hypertension   . Hyperlipidemia with target LDL less than 70   . 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 Pneumonia    x2  . PTSD  (post-traumatic stress disorder) - with depression symptoms    Recently started on medication by the [place]. This has helped his memory issues.  . Thrombocytopenia (HCC) 04/29/2014  . Vitamin D deficiency     PAST SURGICAL HISTORY: Past Surgical History:  Procedure Laterality Date  . CAROTID ENDARTERECTOMY    . LEFT HEART CATHETERIZATION WITH CORONARY ANGIOGRAM N/A 02/13/2012   Procedure: LEFT HEART CATHETERIZATION WITH CORONARY ANGIOGRAM;  Surgeon: Donato Schultz, MD;  [facility] C[facility] LAB;  Service: Cardiovascular: Diffuse mid LAD 50%, 50%pOM1, 50%mRCA -> medical management  . TRANSTHORACIC ECHOCARDIOGRAM  01/2012   EF 55-60%. Mild LVH. No RWMA, mild Aortic Stenosis (mean/peak gradient 13 mmHg/21 mmHg)  . TRANSTHORACIC ECHOCARDIOGRAM  May 2017, May 2019   A) EF 60-65% w/o RWMA. Normal DF for age. Mod AD (mean/peak gradient 24/35 mmHg); b) EF 60-65%, mod LVH, Mod AS (Mean gradient (S): 28 mm Hg. Peak 43 mmHg  . US CAROTID DOPPLER BILATERAL ([facility] HX) Bilateral 02/05/2016   Stable mild-to-moderate disease with bilateral heterogeneous plaque. RICA - 1-39%, LICA 40-59%. Bilateral subclavian and vertebral arteries normal.    FAMILY HISTORY: Family History  Problem Relation Age of Onset  . Diabetes Mother   . Stroke Mother   . Hypertension Mother   . Hyperlipidemia Mother   . Cancer Father   . Hypertension Brother   . Cancer Other     SOCIAL HISTORY: Social History   Socioeconomic History  . Marital status: Married    Spouse name: Bonita Quin  . Number of children: 1  . Years of education: 77  . Highest education level: Not on file  Occupational History  . Occupation: N/A  Social Needs  . Financial resource strain: Not on file  . Food insecurity:    Worry: Not on file    Inability: Not on file  . Transportation needs:    Medical: Not on file    Non-medical: Not on file  Tobacco Use  . Smoking status: Former Smoker    Types: Cigarettes    Last attempt to quit: 02/10/1988    Years  since quitting: 30.3  . Smokeless tobacco: Never Used  Substance and Sexual Activity  . Alcohol use: No  . Drug use: No  . Sexual activity: Not on file    Comment: Married  Lifestyle  . Physical activity:    Days per week: Not on file    Minutes per session: Not on file  . Stress: Not on file  Relationships  . Social connections:    Talks on phone: Not on file    Gets together: Not on file    Attends religious service: Not on file    Active member of  club or organization: Not on file    Attends meetings of clubs or organizations: Not on file    Relationship status: Not on file  . Intimate partner violence:    Fear of current or ex partner: Not on file    Emotionally abused: Not on file    Physically abused: Not on file    Forced sexual activity: Not on file  Other Topics Concern  . Not on file  Social History Narrative   Lives at home w/ wife   Right-handed   Caffeine: 5-6 cups per day      PHYSICAL EXAM  Vitals:   05/26/18 1258  BP: (!) 144/68  Pulse: 71  Resp: 18  Weight: 154 lb (69.9 kg)  Height: 5\' 1"  (1.549 m)   Body mass index is 29.1 kg/m.   MMSE - Mini Mental State Exam 05/26/2018 10/01/2017 03/30/2017  Orientation to time 0 3 3  Orientation to Place 0 0 3  Registration 2 3 3   Attention/ Calculation 0 1 1  Recall 0 3 1  Language- name 2 objects 2 2 2   Language- repeat 0 0 0  Language- follow 3 step command 3 3 3   Language- read & follow direction 1 1 1   Write a sentence 0 1 1  Copy design 1 0 1  Total score 9 17 19      Generalized: Well developed, in no acute distress   Neurological examination  Mentation: Alert. Follows all commands speech and language fluent Cranial nerve II-XII: Pupils were equal round reactive to light. Extraocular movements were full, visual field were full on confrontational test. Facial sensation and strength were normal. Uvula tongue midline. Head turning and shoulder shrug  were normal and symmetric. Motor: The motor  testing reveals 5 over 5 strength of all 4 extremities. Good symmetric motor tone is noted throughout.  Sensory: Sensory testing is intact to soft touch on all 4 extremities. No evidence of extinction is noted.  Coordination: Cerebellar testing reveals good finger-nose-finger and heel-to-shin bilaterally.  Gait and station: Gait is normal.  Reflexes: Deep tendon reflexes are symmetric and normal bilaterally.   DIAGNOSTIC DATA (LABS, IMAGING, TESTING) - I reviewed patient records, labs, notes, testing and imaging myself where available.  Lab Results  Component Value Date   WBC 5.9 12/16/2017   HGB 13.3 12/16/2017   HCT 40.6 12/16/2017   MCV 88.1 12/16/2017   PLT 128 (L) 12/16/2017      Component Value Date/Time   NA 138 03/09/2018 1306   K 4.3 03/09/2018 1306   CL 101 03/09/2018 1306   CO2 24 03/09/2018 1306   GLUCOSE 111 (H) 03/09/2018 1306   GLUCOSE 146 (H) 01/17/2016 1342   BUN 20 03/09/2018 1306   CREATININE 1.24 03/09/2018 1306   CREATININE 1.14 02/09/2013 0954   CALCIUM 8.6 03/09/2018 1306   PROT 6.1 03/09/2018 1306   ALBUMIN 4.1 03/09/2018 1306   AST 16 03/09/2018 1306   ALT 15 03/09/2018 1306   ALKPHOS 60 03/09/2018 1306   BILITOT 0.8 03/09/2018 1306   GFRNONAA 57 (L) 03/09/2018 1306   GFRNONAA 65 02/09/2013 0954   GFRAA 66 03/09/2018 1306   GFRAA 75 02/09/2013 0954   Lab Results  Component Value Date   CHOL 169 03/09/2018   HDL 42 03/09/2018   LDLCALC 104 (H) 03/09/2018   TRIG 116 03/09/2018   CHOLHDL 4.0 03/09/2018   Lab Results  Component Value Date   HGBA1C 6.5 10/11/2015   Lab Results  Component Value Date   VITAMINB12 CANCELED 09/01/2017   Lab Results  Component Value Date   TSH 2.230 03/28/2014      ASSESSMENT AND PLAN 77 y.o. year old male  has a past medical history of Chronic stable angina (HCC), Coronary artery disease, non-occlusive (01/2012), Depression, Diabetes mellitus, Essential hypertension, Hyperlipidemia with target LDL less  than 70, Moderate calcific aortic stenosis (01/2012), Pneumonia, PTSD (post-traumatic stress disorder) - with depression symptoms, Thrombocytopenia (HCC) (04/29/2014), and Vitamin D deficiency. here with:  1.  Memory disturbance  The patient's memory score has declined since the last visit.  He will remain on Namenda.  We will do a trial of him going off of the Aricept to see if the diarrhea improves.  The wife will keep [place] informed.  I have advised that if his symptoms worsen or he develops new symptoms he should let us know.  He will follow-up in 6 months or sooner if needed.   I spent 15 minutes with the patient. 50% of this time was spent reviewing his memory for   Butch Penny, MSN, NP-C 05/26/2018, 1:16 PM [facility] [address] [place], [place] [address] [phone]

## 2018-06-11 ENCOUNTER — Ambulatory Visit: Payer: Medicare Other | Admitting: Nurse Practitioner

## 2018-06-11 ENCOUNTER — Encounter: Payer: Self-pay | Admitting: Nurse Practitioner

## 2018-06-11 VITALS — BP 111/67 | HR 60 | Temp 97.1°F | Ht 61.0 in | Wt 153.6 lb

## 2018-06-11 DIAGNOSIS — R413 Other amnesia: Secondary | ICD-10-CM

## 2018-06-11 DIAGNOSIS — F339 Major depressive disorder, recurrent, unspecified: Secondary | ICD-10-CM

## 2018-06-11 DIAGNOSIS — E1142 Type 2 diabetes mellitus with diabetic polyneuropathy: Secondary | ICD-10-CM

## 2018-06-11 DIAGNOSIS — I350 Nonrheumatic aortic (valve) stenosis: Secondary | ICD-10-CM | POA: Diagnosis not present

## 2018-06-11 DIAGNOSIS — Z23 Encounter for immunization: Secondary | ICD-10-CM | POA: Diagnosis not present

## 2018-06-11 DIAGNOSIS — G4730 Sleep apnea, unspecified: Secondary | ICD-10-CM | POA: Diagnosis not present

## 2018-06-11 DIAGNOSIS — I10 Essential (primary) hypertension: Secondary | ICD-10-CM

## 2018-06-11 DIAGNOSIS — E1169 Type 2 diabetes mellitus with other specified complication: Secondary | ICD-10-CM

## 2018-06-11 DIAGNOSIS — I2510 Atherosclerotic heart disease of native coronary artery without angina pectoris: Secondary | ICD-10-CM | POA: Diagnosis not present

## 2018-06-11 DIAGNOSIS — Z6830 Body mass index (BMI) 30.0-30.9, adult: Secondary | ICD-10-CM

## 2018-06-11 DIAGNOSIS — J42 Unspecified chronic bronchitis: Secondary | ICD-10-CM

## 2018-06-11 DIAGNOSIS — M1009 Idiopathic gout, multiple sites: Secondary | ICD-10-CM

## 2018-06-11 DIAGNOSIS — E785 Hyperlipidemia, unspecified: Secondary | ICD-10-CM

## 2018-06-11 DIAGNOSIS — G6281 Critical illness polyneuropathy: Secondary | ICD-10-CM

## 2018-06-11 LAB — LIPID PANEL
Chol/HDL Ratio: 4.4 ratio (ref 0.0–5.0)
Cholesterol, Total: 189 mg/dL (ref 100–199)
HDL: 43 mg/dL (ref >39)
LDL Calculated: 120 mg/dL — ABNORMAL HIGH (ref 0–99)
TRIGLYCERIDES: 128 mg/dL (ref 0–149)
VLDL Cholesterol Cal: 26 mg/dL (ref 5–40)

## 2018-06-11 LAB — CMP14+EGFR
A/G RATIO: 1.8 (ref 1.2–2.2)
ALT: 18 IU/L (ref 0–44)
AST: 20 IU/L (ref 0–40)
Albumin: 4 g/dL (ref 3.5–4.8)
Alkaline Phosphatase: 65 IU/L (ref 39–117)
BILIRUBIN TOTAL: 1 mg/dL (ref 0.0–1.2)
BUN/Creatinine Ratio: 14 (ref 10–24)
BUN: 19 mg/dL (ref 8–27)
CALCIUM: 9.2 mg/dL (ref 8.6–10.2)
CHLORIDE: 97 mmol/L (ref 96–106)
CO2: 25 mmol/L (ref 20–29)
Creatinine, Ser: 1.35 mg/dL — ABNORMAL HIGH (ref 0.76–1.27)
GFR, EST AFRICAN AMERICAN: 59 mL/min/{1.73_m2} — AB (ref >59)
GFR, EST NON AFRICAN AMERICAN: 51 mL/min/{1.73_m2} — AB (ref >59)
GLOBULIN, TOTAL: 2.2 g/dL (ref 1.5–4.5)
Glucose: 112 mg/dL — ABNORMAL HIGH (ref 65–99)
POTASSIUM: 4.4 mmol/L (ref 3.5–5.2)
SODIUM: 135 mmol/L (ref 134–144)
Total Protein: 6.2 g/dL (ref 6.0–8.5)

## 2018-06-11 LAB — BAYER DCA HB A1C WAIVED: HB A1C (BAYER DCA - WAIVED): 6.4 % (ref <7.0)

## 2018-06-11 MED ORDER — LISINOPRIL 20 MG PO TABS: 20.0000 mg | ORAL_TABLET | Freq: Every day | ORAL | 1 refills | Status: DC

## 2018-06-11 MED ORDER — FLUTICASONE-SALMETEROL 100-50 MCG/DOSE IN AEPB: 1.0000 | INHALATION_SPRAY | Freq: Two times a day (BID) | RESPIRATORY_TRACT | 5 refills | Status: DC

## 2018-06-11 MED ORDER — SERTRALINE HCL 100 MG PO TABS: 100.0000 mg | ORAL_TABLET | Freq: Every day | ORAL | 1 refills | Status: DC

## 2018-06-11 MED ORDER — ISOSORBIDE MONONITRATE ER 30 MG PO TB24: 30.0000 mg | ORAL_TABLET | Freq: Every day | ORAL | 1 refills | Status: DC

## 2018-06-11 MED ORDER — IPRATROPIUM-ALBUTEROL 20-100 MCG/ACT IN AERS: 1.0000 | INHALATION_SPRAY | Freq: Four times a day (QID) | RESPIRATORY_TRACT | 5 refills | Status: DC

## 2018-06-11 MED ORDER — DONEPEZIL HCL 10 MG PO TABS: 5.0000 mg | ORAL_TABLET | Freq: Every day | ORAL | 1 refills | Status: DC

## 2018-06-11 MED ORDER — MEMANTINE HCL 10 MG PO TABS: 10.0000 mg | ORAL_TABLET | Freq: Two times a day (BID) | ORAL | 1 refills | Status: DC

## 2018-06-11 NOTE — Patient Instructions (Signed)
Dementia Caregiver Guide Dementia is a term used to describe a number of symptoms that affect memory and thinking. The most common symptoms include:  Memory loss.  Trouble with language and communication.  Trouble concentrating.  Poor judgment.  Problems with reasoning.  Child-like behavior and language.  Extreme anxiety.  Angry outbursts.  Wandering from home or public places.  Dementia usually gets worse slowly over time. In the early stages, people with dementia can stay independent and safe with some help. In later stages, they need help with daily tasks such as dressing, grooming, and using the bathroom. How to help the person with dementia cope Dementia can be frightening and confusing. Here are some tips to help the person with dementia cope with changes caused by the disease. General tips  Keep the person on track with his or her routine.  Try to identify areas where the person may need help.  Be supportive, patient, calm, and encouraging.  Gently remind the person that adjusting to changes takes time.  Help with the tasks that the person has asked for help with.  Keep the person involved in daily tasks and decisions as much as possible.  Encourage conversation, but try not to get frustrated or harried if the person struggles to find words or does not seem to appreciate your help. Communication tips  When the person is talking or seems frustrated, make eye contact and hold the person's hand.  Ask specific questions that need yes or no answers.  Use simple words, short sentences, and a calm voice. Only give one direction at a time.  When offering choices, limit them to just 1 or 2.  Avoid correcting the person in a negative way.  If the person is struggling to find the right words, gently try to help him or her. How to recognize symptoms of stress Symptoms of stress in caregivers include:  Feeling frustrated or angry with the person with  dementia.  Denying that the person has dementia or that his or her symptoms will not improve.  Feeling hopeless and unappreciated.  Difficulty sleeping.  Difficulty concentrating.  Feeling anxious, irritable, or depressed.  Developing stress-related health problems.  Feeling like you have too little time for your own life.  Follow these instructions at home:  Make sure that you and the person you are caring for: ? Get regular sleep. ? Exercise regularly. ? Eat regular, nutritious meals. ? Drink enough fluid to keep your urine clear or pale yellow. ? Take over-the-counter and prescription medicines only as told by your health care providers. ? Attend all scheduled health care appointments.  Join a support group with others who are caregivers.  Ask about respite care resources so that you can have a regular break from the stress of caregiving.  Look for signs of stress in yourself and in the person you are caring for. If you notice signs of stress, take steps to manage it.  Consider any safety risks and take steps to avoid them.  Organize medications in a pill box for each day of the week.  Create a plan to handle any legal or financial matters. Get legal or financial advice if needed.  Keep a calendar in a central location to remind the person of appointments or other activities. Tips for reducing the risk of injury  Keep floors clear of clutter. Remove rugs, magazine racks, and floor lamps.  Keep hallways well lit, especially at night.  Put a handrail and nonslip mat in the bathtub   or shower.  Put childproof locks on cabinets that contain dangerous items, such as medicines, alcohol, guns, toxic cleaning items, sharp tools or utensils, matches, and lighters.  Put the locks in places where the person cannot see or reach them easily. This will help ensure that the person does not wander out of the house and get lost.  Be prepared for emergencies. Keep a list of  emergency phone numbers and addresses in a convenient area.  Remove car keys and lock garage doors so that the person does not try to get in the car and drive.  Have the person wear a bracelet that tracks locations and identifies the person as having memory problems. This should be worn at all times for safety. Where to find support: Many individuals and organizations offer support. These include:  Support groups for people with dementia and for caregivers.  Counselors or therapists.  Home health care services.  Adult day care centers.  Where to find more information: Alzheimer's Association: www.alz.org Contact a health care provider if:  The person's health is rapidly getting worse.  You are no longer able to care for the person.  Caring for the person is affecting your physical and emotional health.  The person threatens himself or herself, you, or anyone else. Summary  Dementia is a term used to describe a number of symptoms that affect memory and thinking.  Dementia usually gets worse slowly over time.  Take steps to reduce the person's risk of injury, and to plan for future care.  Caregivers need support, relief from caregiving, and time for their own lives. This information is not intended to replace advice given to you by your health care provider. Make sure you discuss any questions you have with your health care provider. Document Released: 08/05/2016 Document Revised: 08/05/2016 Document Reviewed: 08/05/2016 Elsevier Interactive Patient Education  2018 [employer]  

## 2018-06-11 NOTE — Progress Notes (Signed)
Subjective:    Patient ID: Dean Harris, male    DOB: 1941-08-03, 77 y.o.   MRN: [id number]   Chief Complaint: medical management of chronic issues  HPI:  1. Essential hypertension, benign  No c/o chest pain, sob or headache. Does not check blood pressure at home. BP Readings from Last 3 Encounters:  05/26/18 (!) 144/68  03/09/18 131/68  02/11/18 109/66     2. Coronary artery disease, non-occlusive  Last follow up with cardiology was 01/31/18. According to office note - no change to plan of care.  3. Moderate aortic stenosis  Again follow up with cardiology was 01/31/18.  4. Sleep apnea, unspecified type  Does not have cpap, said he did not need machine- sleeps well according to wife.  5. Chronic bronchitis, unspecified chronic bronchitis type ([place])  Has occasional cough but overall is doing well.  6. Type 2 diabetes mellitus with diabetic polyneuropathy, without long-term current use of insulin ([place]) last hgba1c was 6.3%. His wife really watches his diet. Blood sugars fasting are always below 130.  7. Hyperlipidemia associated with type 2 diabetes mellitus ([place])  Wife does not cook a lot of fried foods. He does very little exercise.  8. Polyneuropathy associated with critical illness ([place])  Feet burn all the time. His wife checks his feet daily and keeps toenails trimmed for him.  9. Depression, recurrent ([place])  He is on zoloft. Wife says he is dong well.  10. Memory disorder  Gradually getting worse but she says is slow progression. She tries ti keep his brain working daily.  11. Idiopathic gout of multiple sites, unspecified chronicity  Has not had any recent flare ups.  12. BMI 30.0-30.9,adult  No recent weight changes    Outpatient Encounter Medications as of 06/11/2018  Medication Sig  . ACCU-CHEK SOFTCLIX LANCETS lancets USE 1  TO CHECK GLUCOSE ONCE DAILY AND AS NEEDED  . aspirin 325 MG tablet Take 325 mg by mouth daily.  Marland Kitchen b complex vitamins tablet Take 1 tablet  by mouth daily.  . Cholecalciferol (VITAMIN D) 2000 UNITS tablet Take 2,000 Units by mouth daily.   Marland Kitchen donepezil (ARICEPT) 10 MG tablet Take 0.5 tablets (5 mg total) by mouth at bedtime.  . Fluticasone-Salmeterol (ADVAIR DISKUS) 100-50 MCG/DOSE AEPB Inhale 1 puff into the lungs every 12 (twelve) hours.  Marland Kitchen glucose blood (ACCU-CHEK AVIVA PLUS) test strip USE 1 STRIP TO CHECK GLUCOSE ONCE DAILY AND AS NEEDED  . Ipratropium-Albuterol (COMBIVENT) 20-100 MCG/ACT AERS respimat Inhale 1 puff into the lungs every 6 (six) hours.  . isosorbide mononitrate (IMDUR) 30 MG 24 hr tablet Take 1 tablet (30 mg total) by mouth daily.  Marland Kitchen lisinopril (PRINIVIL,ZESTRIL) 20 MG tablet Take 1 tablet (20 mg total) by mouth daily.  . memantine (NAMENDA) 10 MG tablet Take 1 tablet (10 mg total) by mouth 2 (two) times daily.  . nitroGLYCERIN (NITROSTAT) 0.4 MG SL tablet Use 1 tabket under tongue at onset and may repeat every 5 minutes x2  . sertraline (ZOLOFT) 100 MG tablet Take 1 tablet (100 mg total) by mouth daily.  . travoprost, benzalkonium, (TRAVATAN) 0.004 % ophthalmic solution 1 drop at bedtime.       New complaints: None today  Social history: Lives with wife who is his caregiver.   Review of Systems  Constitutional: Negative for activity change and appetite change.  HENT: Negative.   Eyes: Negative for pain.  Respiratory: Negative for shortness of breath.   Cardiovascular: Negative for  chest pain, palpitations and leg swelling.  Gastrointestinal: Negative for abdominal pain.  Endocrine: Negative for polydipsia.  Genitourinary: Negative.   Skin: Negative for rash.  Neurological: Negative for dizziness, weakness and headaches.  Hematological: Does not bruise/bleed easily.  Psychiatric/Behavioral: Negative.   All other systems reviewed and are negative.      Objective:   Physical Exam  Constitutional: He is oriented to person, place, and time. He appears well-developed and well-nourished.  HENT:    Head: Normocephalic.  Nose: Nose normal.  Mouth/Throat: Oropharynx is clear and moist.  Eyes: Pupils are equal, round, and reactive to light. EOM are normal.  Neck: Normal range of motion and phonation normal. Neck supple. No JVD present. Carotid bruit is not present. No thyroid mass and no thyromegaly present.  Cardiovascular: Normal rate and regular rhythm.  Pulmonary/Chest: Effort normal and breath sounds normal. No respiratory distress.  Abdominal: Soft. Normal appearance, normal aorta and bowel sounds are normal. There is no tenderness.  Musculoskeletal: Normal range of motion.  Lymphadenopathy:    He has no cervical adenopathy.  Neurological: He is alert and oriented to person, place, and time.  Skin: Skin is warm and dry.  Psychiatric: He has a normal mood and affect. His behavior is normal. Judgment and thought content normal.  Nursing note and vitals reviewed.  BP 111/67   Pulse 60   Temp (!) 97.1 F (36.2 C) (Oral)   Ht '5\' 1"'  (1.549 m)   Wt 153 lb 9.6 oz (69.7 kg)   BMI 29.02 kg/m   hgba1c 6.4     Assessment & Plan:  KAZMIR OKI comes in today with chief complaint of Medical Management of Chronic Issues   Diagnosis and orders addressed:  1. Essential hypertension, benign Low sodium diet - CMP14+EGFR - lisinopril (PRINIVIL,ZESTRIL) 20 MG tablet; Take 1 tablet (20 mg total) by mouth daily.  Dispense: 90 tablet; Refill: 1  2. Coronary artery disease, non-occlusive Keep follow up with crdiology - CMP14+EGFR - isosorbide mononitrate (IMDUR) 30 MG 24 hr tablet; Take 1 tablet (30 mg total) by mouth daily.  Dispense: 90 tablet; Refill: 1  3. Moderate aortic stenosis - CMP14+EGFR  4. Sleep apnea, unspecified type - CMP14+EGFR  5. Chronic bronchitis, unspecified chronic bronchitis type (HCC) Continue inhalers - CMP14+EGFR - Ipratropium-Albuterol (COMBIVENT) 20-100 MCG/ACT AERS respimat; Inhale 1 puff into the lungs every 6 (six) hours.  Dispense: 4 g; Refill:  5 - Fluticasone-Salmeterol (ADVAIR DISKUS) 100-50 MCG/DOSE AEPB; Inhale 1 puff into the lungs every 12 (twelve) hours.  Dispense: 60 each; Refill: 5  6. Type 2 diabetes mellitus with diabetic polyneuropathy, without long-term current use of insulin (HCC) Continue to wathc carbs in diet - Bayer DCA Hb A1c Waived - Microalbumin / creatinine urine ratio - CMP14+EGFR  7. Hyperlipidemia associated with type 2 diabetes mellitus (HCC) Low fat diet - CMP14+EGFR - Lipid panel  8. Polyneuropathy associated with critical illness ([place]) Do not go barefooted Keep every 6 months follow up with dr. Irving Shows - CMP14+EGFR  9. Depression, recurrent ([place]) Stress management - CMP14+EGFR - sertraline (ZOLOFT) 100 MG tablet; Take 1 tablet (100 mg total) by mouth daily.  Dispense: 90 tablet; Refill: 1  10. Memory disorder [place] daily - CMP14+EGFR - memantine (NAMENDA) 10 MG tablet; Take 1 tablet (10 mg total) by mouth 2 (two) times daily.  Dispense: 180 tablet; Refill: 1 - donepezil (ARICEPT) 10 MG tablet; Take 0.5 tablets (5 mg total) by mouth at bedtime.  Dispense: 90 tablet; Refill:  1  11. Idiopathic gout of multiple sites, unspecified chronicity - CMP14+EGFR  12. BMI 30.0-30.9,adult Discussed diet and exercise for person with BMI >25 Will recheck weight in 3-6 months - CMP14+EGFR   Labs pending Health Maintenance reviewed Diet and exercise encouraged  Follow up plan: 3 months   Mary-Margaret Hassell Done, FNP

## 2018-06-12 LAB — MICROALBUMIN / CREATININE URINE RATIO: Creatinine, Urine: 106.2 mg/dL

## 2018-06-16 ENCOUNTER — Other Ambulatory Visit (HOSPITAL_COMMUNITY): Payer: Self-pay | Admitting: *Deleted

## 2018-06-16 DIAGNOSIS — D696 Thrombocytopenia, unspecified: Secondary | ICD-10-CM

## 2018-06-17 ENCOUNTER — Inpatient Hospital Stay (HOSPITAL_COMMUNITY): Payer: Medicare Other | Attending: Hematology

## 2018-06-17 DIAGNOSIS — D696 Thrombocytopenia, unspecified: Secondary | ICD-10-CM | POA: Diagnosis not present

## 2018-06-17 LAB — CBC WITH DIFFERENTIAL/PLATELET
Basophils Absolute: 0.1 10*3/uL (ref 0.0–0.1)
Basophils Relative: 1 %
EOS PCT: 4 %
Eosinophils Absolute: 0.2 10*3/uL (ref 0.0–0.7)
HEMATOCRIT: 40.1 % (ref 39.0–52.0)
HEMOGLOBIN: 13.6 g/dL (ref 13.0–17.0)
LYMPHS ABS: 2.2 10*3/uL (ref 0.7–4.0)
LYMPHS PCT: 40 %
MCH: 30 pg (ref 26.0–34.0)
MCHC: 33.9 g/dL (ref 30.0–36.0)
MCV: 88.5 fL (ref 78.0–100.0)
Monocytes Absolute: 0.4 10*3/uL (ref 0.1–1.0)
Monocytes Relative: 7 %
NEUTROS PCT: 48 %
Neutro Abs: 2.7 10*3/uL (ref 1.7–7.7)
Platelets: 147 10*3/uL — ABNORMAL LOW (ref 150–400)
RBC: 4.53 MIL/uL (ref 4.22–5.81)
RDW: 13.2 % (ref 11.5–15.5)
WBC: 5.5 10*3/uL (ref 4.0–10.5)

## 2018-06-17 LAB — COMPREHENSIVE METABOLIC PANEL
ALK PHOS: 53 U/L (ref 38–126)
ALT: 16 U/L (ref 0–44)
AST: 18 U/L (ref 15–41)
Albumin: 4 g/dL (ref 3.5–5.0)
Anion gap: 6 (ref 5–15)
BILIRUBIN TOTAL: 1.4 mg/dL — AB (ref 0.3–1.2)
BUN: 22 mg/dL (ref 8–23)
CALCIUM: 9.2 mg/dL (ref 8.9–10.3)
CO2: 29 mmol/L (ref 22–32)
CREATININE: 1.24 mg/dL (ref 0.61–1.24)
Chloride: 103 mmol/L (ref 98–111)
GFR calc Af Amer: >60 mL/min (ref >60)
GFR, EST NON AFRICAN AMERICAN: 56 mL/min — AB (ref >60)
Glucose, Bld: 133 mg/dL — ABNORMAL HIGH (ref 70–99)
Potassium: 4.2 mmol/L (ref 3.5–5.1)
Sodium: 138 mmol/L (ref 135–145)
Total Protein: 6.8 g/dL (ref 6.5–8.1)

## 2018-07-05 ENCOUNTER — Other Ambulatory Visit: Payer: Self-pay | Admitting: Neurology

## 2018-08-16 ENCOUNTER — Other Ambulatory Visit: Payer: Self-pay | Admitting: Nurse Practitioner

## 2018-08-16 DIAGNOSIS — R413 Other amnesia: Secondary | ICD-10-CM

## 2018-08-16 DIAGNOSIS — I2510 Atherosclerotic heart disease of native coronary artery without angina pectoris: Secondary | ICD-10-CM

## 2018-09-10 ENCOUNTER — Ambulatory Visit: Payer: Medicare Other | Admitting: Nurse Practitioner

## 2018-09-10 ENCOUNTER — Encounter: Payer: Self-pay | Admitting: Nurse Practitioner

## 2018-09-10 VITALS — BP 132/76 | HR 59 | Temp 96.7°F | Ht 61.0 in | Wt 152.0 lb

## 2018-09-10 DIAGNOSIS — J42 Unspecified chronic bronchitis: Secondary | ICD-10-CM | POA: Diagnosis not present

## 2018-09-10 DIAGNOSIS — E1169 Type 2 diabetes mellitus with other specified complication: Secondary | ICD-10-CM

## 2018-09-10 DIAGNOSIS — I6523 Occlusion and stenosis of bilateral carotid arteries: Secondary | ICD-10-CM | POA: Diagnosis not present

## 2018-09-10 DIAGNOSIS — Z6830 Body mass index (BMI) 30.0-30.9, adult: Secondary | ICD-10-CM

## 2018-09-10 DIAGNOSIS — R413 Other amnesia: Secondary | ICD-10-CM

## 2018-09-10 DIAGNOSIS — I10 Essential (primary) hypertension: Secondary | ICD-10-CM | POA: Diagnosis not present

## 2018-09-10 DIAGNOSIS — F339 Major depressive disorder, recurrent, unspecified: Secondary | ICD-10-CM

## 2018-09-10 DIAGNOSIS — G4730 Sleep apnea, unspecified: Secondary | ICD-10-CM

## 2018-09-10 DIAGNOSIS — M1009 Idiopathic gout, multiple sites: Secondary | ICD-10-CM

## 2018-09-10 DIAGNOSIS — E785 Hyperlipidemia, unspecified: Secondary | ICD-10-CM

## 2018-09-10 DIAGNOSIS — G6281 Critical illness polyneuropathy: Secondary | ICD-10-CM

## 2018-09-10 DIAGNOSIS — D696 Thrombocytopenia, unspecified: Secondary | ICD-10-CM

## 2018-09-10 DIAGNOSIS — E1142 Type 2 diabetes mellitus with diabetic polyneuropathy: Secondary | ICD-10-CM

## 2018-09-10 DIAGNOSIS — I2510 Atherosclerotic heart disease of native coronary artery without angina pectoris: Secondary | ICD-10-CM

## 2018-09-10 LAB — BAYER DCA HB A1C WAIVED: HB A1C (BAYER DCA - WAIVED): 6.2 % (ref <7.0)

## 2018-09-10 MED ORDER — DONEPEZIL HCL 10 MG PO TABS: 5.0000 mg | ORAL_TABLET | Freq: Every day | ORAL | 1 refills | Status: DC

## 2018-09-10 MED ORDER — MEMANTINE HCL 10 MG PO TABS: 10.0000 mg | ORAL_TABLET | Freq: Two times a day (BID) | ORAL | 1 refills | Status: DC

## 2018-09-10 MED ORDER — LISINOPRIL 20 MG PO TABS: 20.0000 mg | ORAL_TABLET | Freq: Every day | ORAL | 1 refills | Status: DC

## 2018-09-10 MED ORDER — SERTRALINE HCL 100 MG PO TABS: 100.0000 mg | ORAL_TABLET | Freq: Every day | ORAL | 1 refills | Status: DC

## 2018-09-10 MED ORDER — ISOSORBIDE MONONITRATE ER 30 MG PO TB24: 30.0000 mg | ORAL_TABLET | Freq: Every day | ORAL | 1 refills | Status: DC

## 2018-09-10 NOTE — Progress Notes (Signed)
Subjective:    Patient ID: Dean Harris, male    DOB: [birth year], 77 y.o.   MRN: [id number]   Chief Complaint: Medical management of chronic issues  HPI: has not taken medicines yet today  1. Essential hypertension, benign  No c/o chest pain, sob or headache. Does not check blood pressure at home. BP Readings from Last 3 Encounters:  06/11/18 111/67  05/26/18 (!) 144/68  03/09/18 131/68     2. Carotid artery plaque, bilateral; s/p Bilateral CEA  His wife says that the dr. Ellyn Hack keeps check of this. Last study was uncahanged the best she can remember.  3. Sleep apnea, unspecified type  Wears cpap nightly. Feels rested in mornings  4. Chronic bronchitis, unspecified chronic bronchitis type [facility])  Wife says he is well and has had no issues with cough as of late  5. Hyperlipidemia associated with type 2 diabetes mellitus ([place])  She tries to watch what she feeds him  6. Type 2 diabetes mellitus with diabetic polyneuropathy, without long-term current use of insulin ([place]) last hgba1c was 6.4%. blood sugars stay below 130 fasting when they check them.  7. Polyneuropathy associated with critical illness ([place])  He has lots of burning in feet. His wife takes good care of his feet. She puts lotion on them daily  8. Thrombocytopenia (HCC)  Last platlet count were almost normal at 147. Denies bruising   9. Memory disorder  Wife says he is slowly worsening. He is still bale to recognize some people.  10. Depression, recurrent ([place])  He is on zoloft daily- wife says she thinks he is doing well  11. BMI 30.0-30.9,adult  No recent weight changes  12. Idiopathic gout of multiple sites, unspecified chronicity  Has had no recent gout flare ups.    Outpatient Encounter Medications as of 09/10/2018  Medication Sig  . ACCU-CHEK SOFTCLIX LANCETS lancets USE 1  TO CHECK GLUCOSE ONCE DAILY AND AS NEEDED  . aspirin 325 MG tablet Take 325 mg by mouth daily.  Marland Kitchen b complex vitamins tablet Take 1  tablet by mouth daily.  . Cholecalciferol (VITAMIN D) 2000 UNITS tablet Take 2,000 Units by mouth daily.   Marland Kitchen donepezil (ARICEPT) 10 MG tablet Take 0.5 tablets (5 mg total) by mouth at bedtime.  . Fluticasone-Salmeterol (ADVAIR DISKUS) 100-50 MCG/DOSE AEPB Inhale 1 puff into the lungs every 12 (twelve) hours.  Marland Kitchen glucose blood (ACCU-CHEK AVIVA PLUS) test strip USE 1 STRIP TO CHECK GLUCOSE ONCE DAILY AND AS NEEDED  . Ipratropium-Albuterol (COMBIVENT) 20-100 MCG/ACT AERS respimat Inhale 1 puff into the lungs every 6 (six) hours.  . isosorbide mononitrate (IMDUR) 30 MG 24 hr tablet TAKE 1 TABLET BY MOUTH ONCE DAILY  . lisinopril (PRINIVIL,ZESTRIL) 20 MG tablet Take 1 tablet (20 mg total) by mouth daily.  . memantine (NAMENDA) 10 MG tablet TAKE 1 TABLET BY MOUTH TWICE DAILY  . nitroGLYCERIN (NITROSTAT) 0.4 MG SL tablet Use 1 tabket under tongue at onset and may repeat every 5 minutes x2  . sertraline (ZOLOFT) 100 MG tablet Take 1 tablet (100 mg total) by mouth daily.  . travoprost, benzalkonium, (TRAVATAN) 0.004 % ophthalmic solution 1 drop at bedtime.       New complaints: None today  Social history: His wife is his primary caregiver. She cannot leave him home by hisself. He no longer drives.   Review of Systems  Constitutional: Negative for activity change and appetite change.  HENT: Negative.   Eyes: Negative for pain.  Respiratory: Negative for shortness of breath.   Cardiovascular: Negative for chest pain, palpitations and leg swelling.  Gastrointestinal: Negative for abdominal pain.  Endocrine: Negative for polydipsia.  Genitourinary: Negative.   Skin: Negative for rash.  Neurological: Negative for dizziness, weakness and headaches.  Hematological: Does not bruise/bleed easily.  Psychiatric/Behavioral: Negative.   All other systems reviewed and are negative.      Objective:   Physical Exam Vitals signs and nursing note reviewed.  Constitutional:      Appearance: Normal  appearance. He is well-developed.  HENT:     Head: Normocephalic.     Nose: Nose normal.  Eyes:     Pupils: Pupils are equal, round, and reactive to light.  Neck:     Musculoskeletal: Normal range of motion and neck supple.     Thyroid: No thyroid mass or thyromegaly.     Vascular: No carotid bruit or JVD.     Trachea: Phonation normal.  Cardiovascular:     Rate and Rhythm: Normal rate and regular rhythm.  Pulmonary:     Effort: Pulmonary effort is normal. No respiratory distress.     Breath sounds: Normal breath sounds.  Abdominal:     General: Bowel sounds are normal.     Palpations: Abdomen is soft.     Tenderness: There is no abdominal tenderness.  Musculoskeletal: Normal range of motion.  Lymphadenopathy:     Cervical: No cervical adenopathy.  Skin:    General: Skin is warm and dry.  Neurological:     Mental Status: He is alert and oriented to person, place, and time.  Psychiatric:        Behavior: Behavior normal.        Thought Content: Thought content normal.        Judgment: Judgment normal.    BP 132/76 (BP Location: Left Arm, Cuff Size: Normal)   Pulse (!) 59   Temp (!) 96.7 F (35.9 C) (Oral)   Ht '5\' 1"'$  (1.549 m)   Wt 152 lb (68.9 kg)   BMI 28.72 kg/m   hgba1c 6.2       Assessment & Plan:  Dean Harris comes in today with chief complaint of Medical Management of Chronic Issues   Diagnosis and orders addressed:  1. Essential hypertension, benign Low sodium diet - CMP14+EGFR - lisinopril (PRINIVIL,ZESTRIL) 20 MG tablet; Take 1 tablet (20 mg total) by mouth daily.  Dispense: 90 tablet; Refill: 1  2. Carotid artery plaque, bilateral; s/p Bilateral CEA Keep follow up with R. Ellyn Hack yearly  3. Sleep apnea, unspecified type continue to wear CPAP  4. Chronic bronchitis, unspecified chronic bronchitis type ([place])  5. Hyperlipidemia associated with type 2 diabetes mellitus (HCC) Low fat diet - Lipid panel  6. Type 2 diabetes mellitus with  diabetic polyneuropathy, without long-term current use of insulin (HCC) Continue low carb diet - Bayer DCA Hb A1c Waived  7. Polyneuropathy associated with critical illness ([place]) Do not go barefooted  8. Thrombocytopenia ([place]) Labs pending  9. Memory disorder [place] daily - memantine (NAMENDA) 10 MG tablet; Take 1 tablet (10 mg total) by mouth 2 (two) times daily.  Dispense: 180 tablet; Refill: 1 - donepezil (ARICEPT) 10 MG tablet; Take 0.5 tablets (5 mg total) by mouth at bedtime.  Dispense: 90 tablet; Refill: 1  10. Depression, recurrent ([place]) - sertraline (ZOLOFT) 100 MG tablet; Take 1 tablet (100 mg total) by mouth daily.  Dispense: 90 tablet; Refill: 1  11. BMI 30.0-30.9,adult Discussed diet  and exercise for person with BMI >25 Will recheck weight in 3-6 months  12. Idiopathic gout of multiple sites, unspecified chronicity  13. Coronary artery disease, non-occlusive - isosorbide mononitrate (IMDUR) 30 MG 24 hr tablet; Take 1 tablet (30 mg total) by mouth daily.  Dispense: 90 tablet; Refill: 1   Labs pending Health Maintenance reviewed Diet and exercise encouraged  Follow up plan: 3 months   Mary-Margaret Hassell Done, FNP

## 2018-09-11 LAB — CMP14+EGFR
ALBUMIN: 4.5 g/dL (ref 3.5–4.8)
ALT: 15 IU/L (ref 0–44)
AST: 14 IU/L (ref 0–40)
Albumin/Globulin Ratio: 2 (ref 1.2–2.2)
Alkaline Phosphatase: 77 IU/L (ref 39–117)
BUN / CREAT RATIO: 17 (ref 10–24)
BUN: 20 mg/dL (ref 8–27)
Bilirubin Total: 1 mg/dL (ref 0.0–1.2)
CO2: 27 mmol/L (ref 20–29)
Calcium: 9.5 mg/dL (ref 8.6–10.2)
Chloride: 99 mmol/L (ref 96–106)
Creatinine, Ser: 1.2 mg/dL (ref 0.76–1.27)
GFR calc Af Amer: 68 mL/min/{1.73_m2} (ref >59)
GFR calc non Af Amer: 59 mL/min/{1.73_m2} — ABNORMAL LOW (ref >59)
Globulin, Total: 2.2 g/dL (ref 1.5–4.5)
Glucose: 124 mg/dL — ABNORMAL HIGH (ref 65–99)
Potassium: 4.3 mmol/L (ref 3.5–5.2)
Sodium: 140 mmol/L (ref 134–144)
Total Protein: 6.7 g/dL (ref 6.0–8.5)

## 2018-09-11 LAB — LIPID PANEL
Chol/HDL Ratio: 4.7 ratio (ref 0.0–5.0)
Cholesterol, Total: 206 mg/dL — ABNORMAL HIGH (ref 100–199)
HDL: 44 mg/dL (ref >39)
LDL Calculated: 121 mg/dL — ABNORMAL HIGH (ref 0–99)
Triglycerides: 205 mg/dL — ABNORMAL HIGH (ref 0–149)
VLDL Cholesterol Cal: 41 mg/dL — ABNORMAL HIGH (ref 5–40)

## 2018-09-17 ENCOUNTER — Ambulatory Visit (INDEPENDENT_AMBULATORY_CARE_PROVIDER_SITE_OTHER): Payer: Medicare Other | Admitting: *Deleted

## 2018-09-17 VITALS — BP 133/64 | HR 68 | Ht 61.0 in | Wt 154.0 lb

## 2018-09-17 DIAGNOSIS — Z0000 Encounter for general adult medical examination without abnormal findings: Secondary | ICD-10-CM

## 2018-09-17 NOTE — Patient Instructions (Signed)
  Dean Harris , Thank you for taking time to come for your Medicare Wellness Visit. I appreciate your ongoing commitment to your health goals. Please review the following plan we discussed and let me know if I can assist you in the future.   These are the goals we discussed: Goals    . Exercise 3x per week (30 min per time)     Exercise at least 30 minutes, 3 times weekly.       This is a list of the screening recommended for you and due dates:  Health Maintenance  Topic Date Due  . Complete foot exam   12/17/2018*  . Tetanus Vaccine  09/18/2019*  . Hemoglobin A1C  03/12/2019  . Eye exam for diabetics  05/15/2019  . Colon Cancer Screening  10/23/2020  . Flu Shot  Completed  . Pneumonia vaccines  Completed  *Topic was postponed. The date shown is not the original due date.

## 2018-09-17 NOTE — Progress Notes (Addendum)
Subjective:   Dean Harris is a 78 y.o. male who presents for an Initial Medicare Annual Wellness Visit.  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 Dean Harris lives at home with his wife Dean QuinLinda, Medical laboratory scientific officercat and 2 birds.  They have 1 daughter and 2 granddaughters.  One granddaughter visits often. Dean Harris attends church every Sunday and volunteers through the church. Dean Harris eats 3 or more healthy meals daily and does not exercise at this time. He has had no hospitalizations or surgeries in the past year and overall feels that his health is about the same.    Objective:    Today's Vitals   09/17/18 1122  BP: 133/64  Pulse: 68  Weight: 154 lb (69.9 kg)  Height: 5\' 1"  (1.549 m)   Body mass index is 29.1 kg/m.  Advanced Directives 12/16/2017 12/16/2016 01/17/2016 12/13/2015 12/13/2014 07/26/2014 05/24/2014  Does Patient Have a Medical Advance Directive? Yes No No No No No No  Type of Estate agentAdvance Directive Healthcare Power of [place]orney;Living will - - - - - -  Does patient want to make changes to medical advance directive? No - Patient declined - - - - - -  Copy of Healthcare Power of Attorney in Chart? No - copy requested - - - - - -  Would patient like information on creating a medical advance directive? - No - Patient declined No - patient declined information No - patient declined information No - patient declined information Yes - Educational materials given No - patient declined information  Pre-existing out of facility DNR order (yellow form or pink MOST form) - - - - - - -  Copy requested  Current Medications (verified) Outpatient Encounter Medications as of 09/17/2018  Medication Sig  . ACCU-CHEK SOFTCLIX LANCETS lancets USE 1  TO CHECK GLUCOSE ONCE DAILY AND AS NEEDED  . aspirin 325 MG tablet Take 325 mg by mouth daily.  Marland Kitchen. b complex vitamins tablet Take 1 tablet by mouth daily.  . Cholecalciferol (VITAMIN D) 2000 UNITS tablet  Take 2,000 Units by mouth daily.   Marland Kitchen. donepezil (ARICEPT) 10 MG tablet Take 0.5 tablets (5 mg total) by mouth at bedtime.  . Fluticasone-Salmeterol (ADVAIR DISKUS) 100-50 MCG/DOSE AEPB Inhale 1 puff into the lungs every 12 (twelve) hours.  Marland Kitchen. glucose blood (ACCU-CHEK AVIVA PLUS) test strip USE 1 STRIP TO CHECK GLUCOSE ONCE DAILY AND AS NEEDED  . Ipratropium-Albuterol (COMBIVENT) 20-100 MCG/ACT AERS respimat Inhale 1 puff into the lungs every 6 (six) hours.  . isosorbide mononitrate (IMDUR) 30 MG 24 hr tablet Take 1 tablet (30 mg total) by mouth daily.  Marland Kitchen. lisinopril (PRINIVIL,ZESTRIL) 20 MG tablet Take 1 tablet (20 mg total) by mouth daily.  . memantine (NAMENDA) 10 MG tablet Take 1 tablet (10 mg total) by mouth 2 (two) times daily.  . nitroGLYCERIN (NITROSTAT) 0.4 MG SL tablet Use 1 tabket under tongue at onset and may repeat every 5 minutes x2  . sertraline (ZOLOFT) 100 MG tablet Take 1 tablet (100 mg total) by mouth daily.  . travoprost, benzalkonium, (TRAVATAN) 0.004 % ophthalmic solution 1 drop at bedtime.   No facility-administered encounter medications on file as of 09/17/2018.     Allergies (verified) Dust mite extract; Mold extract [trichophyton]; Pollen extract; and Tetracyclines & related   History: Past Medical History:  Diagnosis Date  . Chronic stable angina (HCC)   . Coronary artery disease, non-occlusive 01/2012  Diffuse percent LAD, OM1 and mid RCA 50% lesions  . Depression   . Diabetes mellitus   . Essential hypertension   . Hyperlipidemia with target LDL less than 70   . 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 Pneumonia    x2  . PTSD (post-traumatic stress disorder) - with depression symptoms    Recently started on medication by the [place]. This has helped his memory issues.  . Thrombocytopenia (HCC) 04/29/2014  . Vitamin D deficiency    Past Surgical  History:  Procedure Laterality Date  . CAROTID ENDARTERECTOMY    . LEFT HEART CATHETERIZATION WITH CORONARY ANGIOGRAM N/A 02/13/2012   Procedure: LEFT HEART CATHETERIZATION WITH CORONARY ANGIOGRAM;  Surgeon: Donato Schultz, MD;  Location: [facility] C[facility] LAB;  Service: Cardiovascular: Diffuse mid LAD 50%, 50%pOM1, 50%mRCA -> medical management  . TRANSTHORACIC ECHOCARDIOGRAM  01/2012   EF 55-60%. Mild LVH. No RWMA, mild Aortic Stenosis (mean/peak gradient 13 mmHg/21 mmHg)  . TRANSTHORACIC ECHOCARDIOGRAM  May 2017, May 2019   A) EF 60-65% w/o RWMA. Normal DF for age. Mod AD (mean/peak gradient 24/35 mmHg); b) EF 60-65%, mod LVH, Mod AS (Mean gradient (S): 28 mm Hg. Peak 43 mmHg  . US CAROTID DOPPLER BILATERAL ([facility] HX) Bilateral 02/05/2016   Stable mild-to-moderate disease with bilateral heterogeneous plaque. RICA - 1-39%, LICA 40-59%. Bilateral subclavian and vertebral arteries normal.   Family History  Problem Relation Age of Onset  . Diabetes Mother   . Stroke Mother   . Hypertension Mother   . Hyperlipidemia Mother   . Cancer Father   . Hypertension Brother   . Cancer Other    Social History   Socioeconomic History  . Marital status: Married    Spouse name: Dean Harris  . Number of children: 1  . Years of education: 41  . Highest education level: Not on file  Occupational History  . Occupation: Retired     Comment: Custodian  Social Needs  . Financial resource strain: Not hard at all  . Food insecurity:    Worry: Never true    Inability: Never true  . Transportation needs:    Medical: No    Non-medical: No  Tobacco Use  . Smoking status: Former Smoker    Types: Cigarettes    Last attempt to quit: 02/10/1988    Years since quitting: 30.6  . Smokeless tobacco: Never Used  Substance and Sexual Activity  . Alcohol use: No  . Drug use: No  . Sexual activity: Not on file    Comment: Married  Lifestyle  . Physical activity:    Days per week: 0 days    Minutes per session: 0 min  .  Stress: Not at all  Relationships  . Social connections:    Talks on phone: More than three times a week    Gets together: More than three times a week    Attends religious service: More than 4 times per year    Active member of club or organization: No    Attends meetings of clubs or organizations: Never    Relationship status: Married  Other Topics Concern  . Not on file  Social History Narrative   Lives at home w/ wife   Right-handed   Caffeine: 5-6 cups per day   Tobacco Counseling Counseling given: Not Answered   Clinical Intake:  Pre-visit preparation completed: No  Pain : No/denies  pain     Nutritional Risks: None Diabetes: Yes CBG done?: No Did pt. bring in CBG monitor from home?: No  How often do you need to have someone help you when you read instructions, pamphlets, or other written materials from your doctor or pharmacy?: 1 - Never What is the last grade level you completed in school?: High School Graduate  Interpreter Needed?: No  Information entered by :: Josetta Huddle, LPN  Activities of Daily Living In your present state of health, do you have any difficulty performing the following activities: 09/17/2018  Hearing? Y  Comment Has hearing test coming up  Vision? N  Difficulty concentrating or making decisions? Y  Walking or climbing stairs? N  Dressing or bathing? N  Doing errands, shopping? N  Some recent data might be hidden     Immunizations and Health Maintenance Immunization History  Administered Date(s) Administered  . Influenza Whole 07/20/2008  . Influenza, High Dose Seasonal PF 07/13/2017, 06/11/2018  . Influenza,inj,Quad PF,6+ Mos 08/24/2013, 07/26/2014, 06/18/2015, 07/14/2016  . Pneumococcal Conjugate-13 05/10/2015  . Pneumococcal Polysaccharide-23 10/16/2009  . Zoster 12/02/2013   There are no preventive care reminders to display for this patient.  Patient Care Team: Bennie Pierini, FNP as PCP - General (Nurse  Practitioner)  Indicate any recent Medical Services you may have received from other than [facility] providers in the past year (date may be approximate).    Assessment:   This is a routine wellness examination for Boss.  Hearing/Vision screen Has upcoming hearing test  Dietary issues and exercise activities discussed: Current Exercise Habits: The patient does not participate in regular exercise at present, Exercise limited by: None identified  Goals    . Exercise 3x per week (30 min per time)     Exercise at least 30 minutes, 3 times weekly.      Depression Screen PHQ 2/9 Scores 09/17/2018 09/10/2018 06/11/2018 03/09/2018  PHQ - 2 Score 0 0 0 0  Exception Documentation Patient refusal - - -    Fall Risk Fall Risk  09/17/2018 09/10/2018 06/11/2018 03/09/2018 12/01/2017  Falls in the past year? 1 1 No No No  Number falls in past yr: 0 0 - - -  Injury with Fall? 0 0 - - -    Is the patient's home free of loose throw rugs in walkways, pet beds, electrical cords, etc?   yes      Grab bars in the bathroom? yes      Handrails on the stairs?   yes      Adequate lighting?   yes  Timed Get Up and Go performed:   Cognitive Function: MMSE - Mini Mental State Exam 09/17/2018 05/26/2018 10/01/2017 03/30/2017 09/30/2016  Not completed: Refused - - - -  Orientation to time - 0 3 3 2   Orientation to Place - 0 0 3 3  Registration - 2 3 3 3   Attention/ Calculation - 0 1 1 4   Recall - 0 3 1 2   Language- name 2 objects - 2 2 2 2   Language- repeat - 0 0 0 0  Language- follow 3 step command - 3 3 3 3   Language- read & follow direction - 1 1 1 1   Write a sentence - 0 1 1 1   Copy design - 1 0 1 0  Total score - 9 17 19 21         Screening Tests Health Maintenance  Topic Date Due  . FOOT EXAM  12/17/2018 (Originally 05/26/2018)  .  TETANUS/TDAP  09/18/2019 (Originally 08/22/2018)  . HEMOGLOBIN A1C  03/12/2019  . OPHTHALMOLOGY EXAM  05/15/2019  . COLONOSCOPY  10/23/2020  . INFLUENZA VACCINE   Completed  . PNA vac Low Risk Adult  Completed    Qualifies for Shingles Vaccine?  Cancer Screenings: Lung: Low Dose CT Chest recommended if Age 74-80 years, 30 pack-year currently smoking OR have quit w/in 15years. Patient does not qualify. Colorectal:   Additional Screenings: Hepatitis C Screening:       Plan:   Encouraged to try to exercise for at least 30 minutes, 3 times weekly.  Encouraged to continue to eat at 3 healthy meals daily that consist of lean proteins, fruits and vegetables. Encouraged to bring in copy of Advance directive to be scanned into chart.  Encouraged to keep follow up appointment with MMM.   I have personally reviewed and noted the following in the patient's chart:   . Medical and social history . Use of alcohol, tobacco or illicit drugs  . Current medications and supplements . Functional ability and status . Nutritional status . Physical activity . Advanced directives . List of other physicians . Hospitalizations, surgeries, and ER visits in previous 12 months . Vitals . Screenings to include cognitive, depression, and falls . Referrals and appointments  In addition, I have reviewed and discussed with patient certain preventive protocols, quality metrics, and best practice recommendations. A written personalized care plan for preventive services as well as general preventive health recommendations were provided to patient.     Caryl BisChanda M Unika Nazareno, LPN   9/6/04541/11/2018    I have reviewed and agree with the above AWV documentation.   Mary-Margaret Daphine DeutscherMartin, FNP

## 2018-09-24 ENCOUNTER — Ambulatory Visit: Payer: Self-pay | Admitting: Licensed Clinical Social Worker

## 2018-09-24 DIAGNOSIS — F339 Major depressive disorder, recurrent, unspecified: Secondary | ICD-10-CM

## 2018-09-24 DIAGNOSIS — I2510 Atherosclerotic heart disease of native coronary artery without angina pectoris: Secondary | ICD-10-CM

## 2018-09-24 DIAGNOSIS — J42 Unspecified chronic bronchitis: Secondary | ICD-10-CM

## 2018-09-24 NOTE — Patient Instructions (Signed)
Licensed Clinical Social Worker Visit Information  Patient Instructions:   [place], spouse of patient call the social worker for psychosocial support if needed [phone])  Patient/Linda Dunnam, spouse of patient, call Chevis Pretty, [place], as needed for medical support for client   Mr. Setter/ Giordan Fordham, spouse of client, were given information about Chronic Care Management services today including:  1. CCM service includes personalized support from designated clinical staff supervised by his physician, including individualized plan of care and coordination with other care providers 2. 24/7 contact phone numbers for assistance for urgent and routine care needs. 3. Service will only be billed when office clinical staff spend 20 minutes or more in a month to coordinate care. 4. Only one practitioner may furnish and bill the service in a calendar month. 5. The patient may stop CCM services at any time (effective at the end of the month) by phone call to the office staff. 6. The patient will be responsible for cost sharing (co-pay) of up to 20% of the service fee (after annual deductible is met).  Patient/Linda Varricchio, spouse, did not agree to [facility] services but are interested. [name], spouse, agreed for LCSW to make follow up call to [name] in 2 weeks to further discuss CCM services.    Plan:  LCSW to call [name], spouse of client, in 2 weeks to further discuss CCM program services.  Patient/Linda Vallone, spouse, to discuss CCM program services  Patient/Linda Mccaslin, spouse, to call Chevis Pretty, [place], as needed for client medical support.  The patient/Linda Maish,spouse of client, verbalized understanding of instructions provided today and declined a print copy of patient instruction materials.   Norva Riffle.Maxey Ransom MSW, LCSW Licensed Clinical Social Worker [facility] [phone]

## 2018-09-24 NOTE — Chronic Care Management (AMB) (Addendum)
  Chronic Care Management   Note  09/24/2018 Name: JONAH GINGRAS MRN: [id number] DOB: 04/29/1941   Telephone outreach call today. LCSW spoke with [name], spouse of client. Vaughan Basta said client prefers callers to talk with her instead of client. She said client has memory difficulties. LCSW spoke with [name] about CCM program services and services available to client with LCSW through [place] said she and client would discuss CCM program through [place]. Vaughan Basta agreed for LCSW to call Vedansh Kerstetter in 2 weeks to discuss further CCM program support.  Mr. Grygiel/ Pink Maye, spouse of client, were given information about Chronic Care Management services today including:  1. CCM service includes personalized support from designated clinical staff supervised by his physician, including individualized plan of care and coordination with other care providers 2. 24/7 contact phone numbers for assistance for urgent and routine care needs. 3. Service will only be billed when office clinical staff spend 20 minutes or more in a month to coordinate care. 4. Only one practitioner may furnish and bill the service in a calendar month. 5. The patient may stop CCM services at any time (effective at the end of the month) by phone call to the office staff. 6. The patient will be responsible for cost sharing (co-pay) of up to 20% of the service fee (after annual deductible is met).  Patient/Linda Manigo, spouse, did not agree to [facility] services at present but are interested.  [name] agreed for LCSW to make follow up call to her in 2 weeks to further discuss CCM services.   Plan:   LCSW to call client/Linda Rosete, spouse of client,  in 2 weeks to further discuss CCM program support  Client/Linda Blue, spouse, to contact Chevis Pretty, [place], as needed to address medical needs of client.   Norva Riffle.Sky Borboa MSW, LCSW Licensed Clinical Social Worker [facility] [phone]  I aggree with plan of care. Mary-Margaret Hassell Done, FNP

## 2018-10-08 ENCOUNTER — Ambulatory Visit: Payer: Self-pay | Admitting: Licensed Clinical Social Worker

## 2018-10-08 NOTE — Patient Instructions (Signed)
[name], spouse were given information about Chronic Care Management services today including:  1. CCM service includes personalized support from designated clinical staff supervised by his physician, including individualized plan of care and coordination with other care providers 2. 24/7 contact phone numbers for assistance for urgent and routine care needs. 3. Service will only be billed when office clinical staff spend 20 minutes or more in a month to coordinate care. 4. Only one practitioner may furnish and bill the service in a calendar month. 5. The patient may stop CCM services at any time (effective at the end of the month) by phone call to the office staff. 6. The patient will be responsible for cost sharing (co-pay) of up to 20% of the service fee (after annual deductible is met).  Patient/Dean Harris, Spouse, did not agree to services and do not wish to consider at this time.  Follow Up Plan:   LCSW to update Dean Pretty, Dean Harris, related to client/spouse of client decision regarding CCM services.  The patient/Dean Harris, Dean Harris, verbalized understanding of instructions provided today and declined a print copy of patient instruction materials.   Dean Harris MSW, LCSW Licensed Clinical Social Worker [facility] [phone]

## 2018-10-08 NOTE — Chronic Care Management (AMB) (Addendum)
  Chronic Care Management    Clinical Social Work General Follow Up Note  10/08/2018 Name: Dean Harris MRN: [id number] DOB: 1940-10-30   Referred by:  Dean Harris for psychosocial assessment  Review of patient status, including review of consultants reports, relevant laboratory and other test results, and collaboration with appropriate care team members and the patient's provider was performed as part of comprehensive patient evaluation and provision of chronic care management services.    Last CCM Appointment: 10/08/2018  [name] / Dean Harris, spouse, were given information about Chronic Care Management services today including:  1. CCM service includes personalized support from designated clinical staff supervised by his physician, including individualized plan of care and coordination with other care providers 2. 24/7 contact phone numbers for assistance for urgent and routine care needs. 3. Service will only be billed when office clinical staff spend 20 minutes or more in a month to coordinate care. 4. Only one practitioner may furnish and bill the service in a calendar month. 5. The patient may stop CCM services at any time (effective at the end of the month) by phone call to the office staff. 6. The patient will be responsible for cost sharing (co-pay) of up to 20% of the service fee (after annual deductible is met).  Patient/Dean Harris, spouse, did not agree to services and do not wish to consider at this time.  Follow Up Plan:   LCSW to update Dean Harris related to client/spouse of client decision regarding CCM services.  Dean Harris.Dean Harris MSW, LCSW Licensed Clinical Social Worker [place] [facility] [phone]  "I have reviewed this encounter including the documentation in this note and/or discussed this patient with the nurse coordinator, Dean Harris . I am certifying that I agree with the content of this note  as supervising physician." [place], FNP

## 2018-12-16 ENCOUNTER — Encounter: Payer: Self-pay | Admitting: Nurse Practitioner

## 2018-12-16 ENCOUNTER — Ambulatory Visit: Payer: Medicare Other | Admitting: Neurology

## 2018-12-16 ENCOUNTER — Ambulatory Visit (INDEPENDENT_AMBULATORY_CARE_PROVIDER_SITE_OTHER): Payer: Medicare Other | Admitting: Nurse Practitioner

## 2018-12-16 ENCOUNTER — Other Ambulatory Visit: Payer: Self-pay

## 2018-12-16 DIAGNOSIS — I10 Essential (primary) hypertension: Secondary | ICD-10-CM

## 2018-12-16 DIAGNOSIS — E1169 Type 2 diabetes mellitus with other specified complication: Secondary | ICD-10-CM

## 2018-12-16 DIAGNOSIS — I2510 Atherosclerotic heart disease of native coronary artery without angina pectoris: Secondary | ICD-10-CM | POA: Diagnosis not present

## 2018-12-16 DIAGNOSIS — G4730 Sleep apnea, unspecified: Secondary | ICD-10-CM

## 2018-12-16 DIAGNOSIS — D696 Thrombocytopenia, unspecified: Secondary | ICD-10-CM

## 2018-12-16 DIAGNOSIS — I208 Other forms of angina pectoris: Secondary | ICD-10-CM

## 2018-12-16 DIAGNOSIS — R413 Other amnesia: Secondary | ICD-10-CM

## 2018-12-16 DIAGNOSIS — Z6830 Body mass index (BMI) 30.0-30.9, adult: Secondary | ICD-10-CM

## 2018-12-16 DIAGNOSIS — E785 Hyperlipidemia, unspecified: Secondary | ICD-10-CM

## 2018-12-16 DIAGNOSIS — E1142 Type 2 diabetes mellitus with diabetic polyneuropathy: Secondary | ICD-10-CM

## 2018-12-16 DIAGNOSIS — J42 Unspecified chronic bronchitis: Secondary | ICD-10-CM

## 2018-12-16 DIAGNOSIS — F339 Major depressive disorder, recurrent, unspecified: Secondary | ICD-10-CM

## 2018-12-16 DIAGNOSIS — G6281 Critical illness polyneuropathy: Secondary | ICD-10-CM

## 2018-12-16 MED ORDER — IPRATROPIUM-ALBUTEROL 20-100 MCG/ACT IN AERS: 1.0000 | INHALATION_SPRAY | Freq: Four times a day (QID) | RESPIRATORY_TRACT | 5 refills | Status: DC

## 2018-12-16 MED ORDER — FLUTICASONE-SALMETEROL 100-50 MCG/DOSE IN AEPB: 1.0000 | INHALATION_SPRAY | Freq: Two times a day (BID) | RESPIRATORY_TRACT | 5 refills | Status: DC

## 2018-12-16 NOTE — Progress Notes (Signed)
Patient ID: Dean Harris, male   DOB: 1941/03/26, 78 y.o.   MRN: [id number]    Virtual Visit via telephone Note  I connected with Dean Harris on 12/16/18 at 10:00 AM by telephone and verified that I am speaking with the correct person using two identifiers. Dean Harris is currently located at home  and wife is currently with her during visit. The provider, [name], FNP is located in their office at time of visit.  I discussed the limitations, risks, security and privacy concerns of performing an evaluation and management service by telephone and the availability of in person appointments. I also discussed with the patient that there may be a patient responsible charge related to this service. The patient expressed understanding and agreed to proceed.   History and Present Illness:   Chief Complaint: Medical Management of Chronic Issues   HPI:  1. Essential hypertension, benign  No c/o chest pain,sob or headaches. He wife occasionally checks his blood pressure and she says runs below 130 systolic. BP Readings from Last 3 Encounters:  09/17/18 133/64  09/10/18 132/76  06/11/18 111/67     2. Coronary artery disease, non-occlusive  Last visit with cardiology was 02/11/18. According to note no changes were made to plan of care.  3. Chronic stable angina [facility])  Wife says that he has not complained of any chest pain  4. Sleep apnea, unspecified type  Wears his CPAP night;y. Says he feels rested in the morning  5. Chronic bronchitis, unspecified chronic bronchitis type (HCC)  On daily adviar- denies cough or SOB  6. Hyperlipidemia associated with type 2 diabetes mellitus (HCC)  His wife really monitors what she feeds him.  7. Type 2 diabetes mellitus with diabetic polyneuropathy, without long-term current use of insulin (HCC) last HGBA!C was 6.2%. his blood sugars have been below 150 consistently  8. Polyneuropathy associated with critical illness (HCC)  His feet  burn and tingle. His wife checks his feet daily. She does not ]let him go barefooted.  9. Depression, recurrent (HCC)  His wife says she has not noticed any recent depression.  10. Thrombocytopenia (HCC)  Last platlett count was 147. No petechial rash noted  11. Memory disorder  Wife say she is becoming more forgetful. He is still able to do his own ADL's.  12. BMI 30.0-30.9,adult  No weight changes    Outpatient Encounter Medications as of 12/16/2018  Medication Sig  . ACCU-CHEK SOFTCLIX LANCETS lancets USE 1  TO CHECK GLUCOSE ONCE DAILY AND AS NEEDED  . aspirin 325 MG tablet Take 325 mg by mouth daily.  Marland Kitchen b complex vitamins tablet Take 1 tablet by mouth daily.  . Cholecalciferol (VITAMIN D) 2000 UNITS tablet Take 2,000 Units by mouth daily.   Marland Kitchen donepezil (ARICEPT) 10 MG tablet Take 0.5 tablets (5 mg total) by mouth at bedtime.  . Fluticasone-Salmeterol (ADVAIR DISKUS) 100-50 MCG/DOSE AEPB Inhale 1 puff into the lungs every 12 (twelve) hours.  Marland Kitchen glucose blood (ACCU-CHEK AVIVA PLUS) test strip USE 1 STRIP TO CHECK GLUCOSE ONCE DAILY AND AS NEEDED  . Ipratropium-Albuterol (COMBIVENT) 20-100 MCG/ACT AERS respimat Inhale 1 puff into the lungs every 6 (six) hours.  . isosorbide mononitrate (IMDUR) 30 MG 24 hr tablet Take 1 tablet (30 mg total) by mouth daily.  Marland Kitchen lisinopril (PRINIVIL,ZESTRIL) 20 MG tablet Take 1 tablet (20 mg total) by mouth daily.  . memantine (NAMENDA) 10 MG tablet Take 1 tablet (10 mg total) by mouth 2 (two)  times daily.  . nitroGLYCERIN (NITROSTAT) 0.4 MG SL tablet Use 1 tabket under tongue at onset and may repeat every 5 minutes x2  . sertraline (ZOLOFT) 100 MG tablet Take 1 tablet (100 mg total) by mouth daily.  . travoprost, benzalkonium, (TRAVATAN) 0.004 % ophthalmic solution 1 drop at bedtime.       New complaints: None today  Social history: Lives with his wife who is his caregiver   Review of Systems  Constitutional: Negative for diaphoresis and weight  loss.  Eyes: Negative for blurred vision, double vision and pain.  Respiratory: Negative for shortness of breath.   Cardiovascular: Negative for chest pain, palpitations, orthopnea and leg swelling.  Gastrointestinal: Negative for abdominal pain.  Musculoskeletal: Negative.   Skin: Negative for rash.  Neurological: Negative for dizziness, sensory change, loss of consciousness, weakness and headaches.  Endo/Heme/Allergies: Negative for polydipsia. Does not bruise/bleed easily.  Psychiatric/Behavioral: Positive for memory loss. Negative for depression. The patient is not nervous/anxious and does not have insomnia.   All other systems reviewed and are negative.      Observations/Objective: Alert and oriented to person and place BS 134  Assessment and Plan: Dean Harris comes in today with chief complaint of Medical Management of Chronic Issues   Diagnosis and orders addressed:  1. Essential hypertension, benign Low fat diet  2. Coronary artery disease, non-occlusive Keep yearly follow up with cardiology  3. Chronic stable angina (HCC)  4. Sleep apnea, unspecified type Continue to wear CPAP  5. Chronic bronchitis, unspecified chronic bronchitis type (HCC) Quarantine to stay away from covid 19 - Ipratropium-Albuterol (COMBIVENT) 20-100 MCG/ACT AERS respimat; Inhale 1 puff into the lungs every 6 (six) hours.  Dispense: 4 g; Refill: 5 - Fluticasone-Salmeterol (ADVAIR DISKUS) 100-50 MCG/DOSE AEPB; Inhale 1 puff into the lungs every 12 (twelve) hours.  Dispense: 60 each; Refill: 5  6. Hyperlipidemia associated with type 2 diabetes mellitus (HCC) Low fat diet  7. Type 2 diabetes mellitus with diabetic polyneuropathy, without long-term current use of insulin (HCC) Continue to monitor blood suagrs Wife does good job watching diet  8. Polyneuropathy associated with critical illness (HCC) Continue to check feet daily Do no go barefooted  9. Depression, recurrent (HCC)  Continue to encourage to stay active daily  10. Thrombocytopenia (HCC) Will check labs at next appointment  11. Memory disorder Continue to orient daily  12. BMI 30.0-30.9,adult Discussed diet and exercise for person with BMI >25 Will recheck weight in 3-6 months   Previous lab results reviewed Health Maintenance reviewed Diet and exercise encouraged  Follow Up Instructions: 3 months    I discussed the assessment and treatment plan with the patient. The patient was provided an opportunity to ask questions and all were answered. The patient agreed with the plan and demonstrated an understanding of the instructions.   The patient was advised to call back or seek an in-person evaluation if the symptoms worsen or if the condition fails to improve as anticipated.  The above assessment and management plan was discussed with the patient. The patient verbalized understanding of and has agreed to the management plan. Patient is aware to call the clinic if symptoms persist or worsen. Patient is aware when to return to the clinic for a follow-up visit. Patient educated on when it is appropriate to go to the emergency department.    I provided 15 minutes of non-face-to-face time during this encounter.    [name], FNP

## 2018-12-22 ENCOUNTER — Other Ambulatory Visit (HOSPITAL_COMMUNITY): Payer: Medicare Other

## 2018-12-22 ENCOUNTER — Ambulatory Visit (HOSPITAL_COMMUNITY): Payer: Medicare Other | Admitting: Hematology

## 2019-02-14 ENCOUNTER — Telehealth: Payer: Medicare Other | Admitting: Cardiology

## 2019-02-15 ENCOUNTER — Other Ambulatory Visit: Payer: Self-pay | Admitting: Nurse Practitioner

## 2019-02-17 ENCOUNTER — Encounter: Payer: Self-pay | Admitting: Family

## 2019-02-17 ENCOUNTER — Other Ambulatory Visit: Payer: Self-pay

## 2019-02-17 ENCOUNTER — Ambulatory Visit (INDEPENDENT_AMBULATORY_CARE_PROVIDER_SITE_OTHER): Payer: Medicare Other | Admitting: Family

## 2019-02-17 DIAGNOSIS — W57XXXA Bitten or stung by nonvenomous insect and other nonvenomous arthropods, initial encounter: Secondary | ICD-10-CM

## 2019-02-17 DIAGNOSIS — S70262A Insect bite (nonvenomous), left hip, initial encounter: Secondary | ICD-10-CM

## 2019-02-17 MED ORDER — DOXYCYCLINE HYCLATE 100 MG PO TABS: 200.0000 mg | ORAL_TABLET | Freq: Once | ORAL | 0 refills | Status: AC

## 2019-02-17 NOTE — Progress Notes (Signed)
   Virtual Visit via telephone Note  I connected with MUSAB BEICHNER and wife on 02/17/19 at 3:51 pm by telephone and verified that I am speaking with the correct person using two identifiers. HIYAN FIFIELD is currently located at home and wife is currently with her during visit. The provider, Jannifer Rodney, FNP is located in their office at time of visit.  I discussed the limitations, risks, security and privacy concerns of performing an evaluation and management service by telephone and the availability of in person appointments. I also discussed with the patient that there may be a patient responsible charge related to this service. The patient expressed understanding and agreed to proceed.   History and Present Illness:  HPI PT and wife calls today with complaints of a tick bite that was removed yesterday on his left hip. They are unsure how long it was attached, but today he is complaining of leg pain today. He also has a low grade fever of 99.5 F.   Denies any rash except where the tick was removed. The area is the size of a nickel and erythemas.  Denies headache or joint pain.    Review of Systems  Constitutional: Positive for fever.  Musculoskeletal: Positive for myalgias.  All other systems reviewed and are negative.    Observations/Objective: No SOB or distress noted, wife did most of the talking  Assessment and Plan: 1. Tick bite of left hip, initial encounter -Will given doxycycline 200 mg once. He has an allergy to tetracycline, but denies any swelling or rash with this. States it could have been nausea, but can not remember exactly. Discussed he will take it and call or go to ED if he has any allergic reaction.  -Pt to report any new fever, joint pain, or rash -Wear protective clothing while outside- Long sleeves and long pants -Put insect repellent on all exposed skin and along clothing -Take a shower as soon as possible after being outside - doxycycline (VIBRA-TABS)  100 MG tablet; Take 2 tablets (200 mg total) by mouth once for 1 dose.  Dispense: 2 tablet; Refill: 0    I discussed the assessment and treatment plan with the patient. The patient was provided an opportunity to ask questions and all were answered. The patient agreed with the plan and demonstrated an understanding of the instructions.   The patient was advised to call back or seek an in-person evaluation if the symptoms worsen or if the condition fails to improve as anticipated.  The above assessment and management plan was discussed with the patient. The patient verbalized understanding of and has agreed to the management plan. Patient is aware to call the clinic if symptoms persist or worsen. Patient is aware when to return to the clinic for a follow-up visit. Patient educated on when it is appropriate to go to the emergency department.   Time call ended: 4:11 pm   I provided 20  minutes of non-face-to-face time during this encounter.    Jannifer Rodney, FNP

## 2019-03-09 ENCOUNTER — Inpatient Hospital Stay (HOSPITAL_COMMUNITY): Payer: Medicare Other | Attending: Hematology

## 2019-03-09 ENCOUNTER — Other Ambulatory Visit: Payer: Self-pay

## 2019-03-09 ENCOUNTER — Inpatient Hospital Stay (HOSPITAL_BASED_OUTPATIENT_CLINIC_OR_DEPARTMENT_OTHER): Payer: Medicare Other | Admitting: Nurse Practitioner

## 2019-03-09 DIAGNOSIS — D649 Anemia, unspecified: Secondary | ICD-10-CM | POA: Diagnosis not present

## 2019-03-09 DIAGNOSIS — Z87891 Personal history of nicotine dependence: Secondary | ICD-10-CM | POA: Insufficient documentation

## 2019-03-09 DIAGNOSIS — D696 Thrombocytopenia, unspecified: Secondary | ICD-10-CM | POA: Diagnosis not present

## 2019-03-09 DIAGNOSIS — R5383 Other fatigue: Secondary | ICD-10-CM | POA: Insufficient documentation

## 2019-03-09 LAB — CBC WITH DIFFERENTIAL/PLATELET
Abs Immature Granulocytes: 0.01 10*3/uL (ref 0.00–0.07)
Basophils Absolute: 0.1 10*3/uL (ref 0.0–0.1)
Basophils Relative: 1 %
Eosinophils Absolute: 0.2 10*3/uL (ref 0.0–0.5)
Eosinophils Relative: 5 %
HCT: 38.6 % — ABNORMAL LOW (ref 39.0–52.0)
Hemoglobin: 12.8 g/dL — ABNORMAL LOW (ref 13.0–17.0)
Immature Granulocytes: 0 %
Lymphocytes Relative: 42 %
Lymphs Abs: 2.2 10*3/uL (ref 0.7–4.0)
MCH: 29.6 pg (ref 26.0–34.0)
MCHC: 33.2 g/dL (ref 30.0–36.0)
MCV: 89.4 fL (ref 80.0–100.0)
Monocytes Absolute: 0.3 10*3/uL (ref 0.1–1.0)
Monocytes Relative: 6 %
Neutro Abs: 2.4 10*3/uL (ref 1.7–7.7)
Neutrophils Relative %: 46 %
Platelets: 124 10*3/uL — ABNORMAL LOW (ref 150–400)
RBC: 4.32 MIL/uL (ref 4.22–5.81)
RDW: 12.9 % (ref 11.5–15.5)
WBC: 5.1 10*3/uL (ref 4.0–10.5)
nRBC: 0 % (ref 0.0–0.2)

## 2019-03-09 LAB — COMPREHENSIVE METABOLIC PANEL
ALT: 21 U/L (ref 0–44)
AST: 17 U/L (ref 15–41)
Albumin: 3.9 g/dL (ref 3.5–5.0)
Alkaline Phosphatase: 61 U/L (ref 38–126)
Anion gap: 9 (ref 5–15)
BUN: 22 mg/dL (ref 8–23)
CO2: 27 mmol/L (ref 22–32)
Calcium: 9.2 mg/dL (ref 8.9–10.3)
Chloride: 100 mmol/L (ref 98–111)
Creatinine, Ser: 1.13 mg/dL (ref 0.61–1.24)
GFR calc Af Amer: >60 mL/min (ref >60)
GFR calc non Af Amer: >60 mL/min (ref >60)
Glucose, Bld: 294 mg/dL — ABNORMAL HIGH (ref 70–99)
Potassium: 4.4 mmol/L (ref 3.5–5.1)
Sodium: 136 mmol/L (ref 135–145)
Total Bilirubin: 1.5 mg/dL — ABNORMAL HIGH (ref 0.3–1.2)
Total Protein: 6.4 g/dL — ABNORMAL LOW (ref 6.5–8.1)

## 2019-03-09 NOTE — Assessment & Plan Note (Addendum)
1.  Chronic mild thrombocytopenia: - Thrombocytopenia suspected to be immune mediated. -Denies any frank bleeding episodes, or abnormal bruising to his trunk, abdomen, or back. -Platelets have been stable. -He is largely asymptomatic and thus we can continue to monitor at this time. -Labs on 03/09/2019 showed his platelet count at 124 - He was given strict instructions to contact us if he has any abnormal bruising or bleeding episodes. - We will continue to do lab work every 6 months. -She will follow-up in 6 months with repeat labs.  2.  Normocytic anemia: - Labs on 03/09/2019 showed his hemoglobin 12.8. - Labs labs in the past showed ferritin at 112 and percent saturation at 31. - He denies any bright red bleeding per rectum or melena. -He does report more fatigued than normal. - We will repeat an iron panel and follow-up in 6 months.

## 2019-03-09 NOTE — Progress Notes (Signed)
[facility] [address] [place], [place] [address]   CLINIC:  Medical Oncology/Hematology  PCP:  Bennie PieriniMartin, Mary-Margaret, FNP [address] [place] [id number] [phone]   REASON FOR VISIT: Follow-up for mild thrombocytopenia  CURRENT THERAPY: Observation   INTERVAL HISTORY:  [name] 78 y.o. male returns for routine follow-up of mild thrombocytopenia.  He reports more fatigue than normal.  He denies any easy bruising or petechiae.  He denies any bright red bleeding per rectum or melena. Denies any nausea, vomiting, or diarrhea. Denies any new pains. Had not noticed any recent bleeding such as epistaxis, hematuria or hematochezia. Denies recent chest pain on exertion, shortness of breath on minimal exertion, pre-syncopal episodes, or palpitations. Denies any numbness or tingling in hands or feet. Denies any recent fevers, infections, or recent hospitalizations. Patient reports appetite at 100 % and energy level at 25 %.  He is eating well and maintaining his weight at this time.    REVIEW OF SYSTEMS:  Review of Systems  Constitutional: Positive for fatigue.  Neurological: Positive for numbness.  All other systems reviewed and are negative.    PAST MEDICAL/SURGICAL HISTORY:  Past Medical History:  Diagnosis Date  . Chronic stable angina (HCC)   . 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 Depression   . Diabetes mellitus   . Essential hypertension   . Hyperlipidemia with target LDL less than 70   . 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 Pneumonia    x2  . PTSD (post-traumatic stress disorder) - with depression symptoms    [facility] [place] [facility]. This has helped his memory issues.  . Thrombocytopenia (HCC) 04/29/2014  . Vitamin D deficiency    Past  Surgical History:  Procedure Laterality Date  . CAROTID ENDARTERECTOMY    . LEFT HEART CATHETERIZATION WITH CORONARY ANGIOGRAM N/A 02/13/2012   Procedure: LEFT HEART CATHETERIZATION WITH CORONARY ANGIOGRAM;  Surgeon: Donato SchultzMark Skains, MD;  [facility];  Service: Cardiovascular: Diffuse mid LAD 50%, 50%pOM1, 50%mRCA -> medical management  . TRANSTHORACIC ECHOCARDIOGRAM  01/2012   EF 55-60%. Mild LVH. No RWMA, mild Aortic Stenosis (mean/peak gradient 13 mmHg/21 mmHg)  . TRANSTHORACIC ECHOCARDIOGRAM  May 2017, May 2019   A) EF 60-65% w/o RWMA. Normal DF for age. Mod AD (mean/peak gradient 24/35 mmHg); b) EF 60-65%, mod LVH, Mod AS (Mean gradient (S): 28 mm Hg. Peak 43 mmHg  . US CAROTID DOPPLER BILATERAL ([facility] HX) Bilateral 02/05/2016   Stable mild-to-moderate disease with bilateral heterogeneous plaque. RICA - 1-39%, LICA 40-59%. Bilateral subclavian and vertebral arteries normal.     SOCIAL HISTORY:  Social History   Socioeconomic History  . Marital status: Married    Spouse name: Bonita QuinLinda  . Number of children: 1  . Years of education: 7112  . Highest education level: Not on file  Occupational History  . Occupation: Retired     Comment: Custodian  Social Needs  . Financial resource strain: Not hard at all  . Food insecurity    Worry: Never true    Inability: Never true  . Transportation needs    Medical: No    Non-medical: No  Tobacco Use  . Smoking status: Former Smoker    Types: Cigarettes    Quit date: 02/10/1988    Years since  quitting: 31.0  . Smokeless tobacco: Never Used  Substance and Sexual Activity  . Alcohol use: No  . Drug use: No  . Sexual activity: Not on file    Comment: Married  Lifestyle  . Physical activity    Days per week: 0 days    Minutes per session: 0 min  . Stress: Not at all  Relationships  . Social connections    Talks on phone: More than three times a week    Gets together: More than three times a week    Attends religious service: More  than 4 times per year    Active member of club or organization: No    Attends meetings of clubs or organizations: Never    Relationship status: Married  . Intimate partner violence    Fear of current or ex partner: No    Emotionally abused: No    Physically abused: No    Forced sexual activity: No  Other Topics Concern  . Not on file  Social History Narrative   Lives at home w/ wife   Right-handed   Caffeine: 5-6 cups per day    FAMILY HISTORY:  Family History  Problem Relation Age of Onset  . Diabetes Mother   . Stroke Mother   . Hypertension Mother   . Hyperlipidemia Mother   . Cancer Father   . Hypertension Brother   . Cancer Other     CURRENT MEDICATIONS:  Outpatient Encounter Medications as of 03/09/2019  Medication Sig  . Accu-Chek Softclix Lancets lancets USE 1  TO CHECK GLUCOSE ONCE DAILY AS NEEDED Dx E11.9  . aspirin 325 MG tablet Take 325 mg by mouth daily.  Marland Kitchen b complex vitamins tablet Take 1 tablet by mouth daily.  . Cholecalciferol (VITAMIN D) 2000 UNITS tablet Take 2,000 Units by mouth daily.   Marland Kitchen donepezil (ARICEPT) 10 MG tablet Take 0.5 tablets (5 mg total) by mouth at bedtime.  . Fluticasone-Salmeterol (ADVAIR DISKUS) 100-50 MCG/DOSE AEPB Inhale 1 puff into the lungs every 12 (twelve) hours.  Marland Kitchen glucose blood (ACCU-CHEK AVIVA PLUS) test strip USE 1 STRIP TO CHECK GLUCOSE ONCE DAILY AND AS NEEDED  . Ipratropium-Albuterol (COMBIVENT) 20-100 MCG/ACT AERS respimat Inhale 1 puff into the lungs every 6 (six) hours.  . isosorbide mononitrate (IMDUR) 30 MG 24 hr tablet Take 1 tablet (30 mg total) by mouth daily.  Marland Kitchen lisinopril (PRINIVIL,ZESTRIL) 20 MG tablet Take 1 tablet (20 mg total) by mouth daily.  . memantine (NAMENDA) 10 MG tablet Take 1 tablet (10 mg total) by mouth 2 (two) times daily.  . nitroGLYCERIN (NITROSTAT) 0.4 MG SL tablet Use 1 tabket under tongue at onset and may repeat every 5 minutes x2  . sertraline (ZOLOFT) 100 MG tablet Take 1 tablet (100 mg  total) by mouth daily.  . travoprost, benzalkonium, (TRAVATAN) 0.004 % ophthalmic solution 1 drop at bedtime.   No facility-administered encounter medications on file as of 03/09/2019.     ALLERGIES:  Allergies  Allergen Reactions  . Dust Mite Extract   . Mold Extract [Trichophyton]   . Pollen Extract   . Tetracyclines & Related     Generic only     PHYSICAL EXAM:  ECOG Performance status: 1  Vitals:   03/09/19 1045  BP: (!) 146/51  Pulse: 65  Resp: 14  Temp: 97.9 F (36.6 C)  SpO2: 97%   Filed Weights   03/09/19 1045  Weight: 147 lb 1.6 oz (66.7 kg)    Physical  Exam Constitutional:      Appearance: Normal appearance. He is normal weight.  Cardiovascular:     Rate and Rhythm: Normal rate and regular rhythm.     Heart sounds: Normal heart sounds.  Pulmonary:     Effort: Pulmonary effort is normal.     Breath sounds: Normal breath sounds.  Abdominal:     General: Bowel sounds are normal.     Palpations: Abdomen is soft.  Musculoskeletal: Normal range of motion.  Skin:    General: Skin is warm and dry.  Neurological:     Mental Status: He is alert and oriented to person, place, and time. Mental status is at baseline.  Psychiatric:        Mood and Affect: Mood normal.        Behavior: Behavior normal.        Thought Content: Thought content normal.        Judgment: Judgment normal.      LABORATORY DATA:  I have reviewed the labs as listed.  CBC    Component Value Date/Time   WBC 5.1 03/09/2019 1025   RBC 4.32 03/09/2019 1025   HGB 12.8 (L) 03/09/2019 1025   HGB 13.1 09/01/2017 1654   HCT 38.6 (L) 03/09/2019 1025   HCT 39.3 09/01/2017 1654   PLT 124 (L) 03/09/2019 1025   PLT 144 (L) 09/01/2017 1654   MCV 89.4 03/09/2019 1025   MCV 89 09/01/2017 1654   MCH 29.6 03/09/2019 1025   MCHC 33.2 03/09/2019 1025   RDW 12.9 03/09/2019 1025   RDW 14.1 09/01/2017 1654   LYMPHSABS 2.2 03/09/2019 1025   LYMPHSABS 2.1 09/01/2017 1654   MONOABS 0.3  03/09/2019 1025   EOSABS 0.2 03/09/2019 1025   EOSABS 0.4 09/01/2017 1654   BASOSABS 0.1 03/09/2019 1025   BASOSABS 0.1 09/01/2017 1654   CMP Latest Ref Rng & Units 03/09/2019 09/10/2018 06/17/2018  Glucose 70 - 99 mg/dL 161(W294(H) 960(A124(H) 540(J133(H)  BUN 8 - 23 mg/dL 22 20 22   Creatinine 0.61 - 1.24 mg/dL 8.111.13 9.141.20 7.821.24  Sodium 135 - 145 mmol/L 136 140 138  Potassium 3.5 - 5.1 mmol/L 4.4 4.3 4.2  Chloride 98 - 111 mmol/L 100 99 103  CO2 22 - 32 mmol/L 27 27 29   Calcium 8.9 - 10.3 mg/dL 9.2 9.5 9.2  Total Protein 6.5 - 8.1 g/dL 6.4(L) 6.7 6.8  Total Bilirubin 0.3 - 1.2 mg/dL 9.5(A1.5(H) 1.0 2.1(H1.4(H)  Alkaline Phos 38 - 126 U/L 61 77 53  AST 15 - 41 U/L 17 14 18   ALT 0 - 44 U/L 21 15 16       I personally performed a face-to-face visit.  All questions were answered to patient's stated satisfaction. Encouraged patient to call with any new concerns or questions before his next visit to the cancer center and we can certain see him sooner, if needed.     ASSESSMENT & PLAN:   Thrombocytopenia (HCC) 1.  Chronic mild thrombocytopenia: - Thrombocytopenia suspected to be immune mediated. -Denies any frank bleeding episodes, or abnormal bruising to his trunk, abdomen, or back. -Platelets have been stable. -He is largely asymptomatic and thus we can continue to monitor at this time. -Labs on 03/09/2019 showed his platelet count at 124 - He was given strict instructions to contact us if he has any abnormal bruising or bleeding episodes. - We will continue to do lab work every 6 months. -She will follow-up in 6 months with repeat labs.  2.  Normocytic  anemia: - Labs on 03/09/2019 showed his hemoglobin 12.8. - Labs labs in the past showed ferritin at 112 and percent saturation at 31. - He denies any bright red bleeding per rectum or melena. -He does report more fatigued than normal. - We will repeat an iron panel and follow-up in 6 months.      Orders placed this encounter:  Orders Placed This  Encounter  Procedures  . Lactate dehydrogenase  . CBC with Differential/Platelet  . Comprehensive metabolic panel  . Ferritin  . Iron and TIBC  . Vitamin B12  . VITAMIN D 25 Hydroxy (Vit-D Deficiency, Fractures)  . Folate      Mathis Budandi Joaquim Tolen, FNP-C [facility] [phone]

## 2019-03-09 NOTE — Patient Instructions (Signed)
[facility] at [facility] Discharge Instructions  Follow up in 6 months with labs    Thank you for choosing [facility] at [facility] to provide your oncology and hematology care.  To afford each patient quality time with our provider, please arrive at least 15 minutes before your scheduled appointment time.   If you have a lab appointment with the Cancer Center please come in thru the  Main Entrance and check in at the main information desk  You need to re-schedule your appointment should you arrive 10 or more minutes late.  We strive to give you quality time with our providers, and arriving late affects you and other patients whose appointments are after yours.  Also, if you no show three or more times for appointments you may be dismissed from the clinic at the providers discretion.     Again, thank you for choosing [facility].  Our hope is that these requests will decrease the amount of time that you wait before being seen by our physicians.       _____________________________________________________________  Should you have questions after your visit to [facility], please contact our office at [phone] between the hours of 8:00 a.m. and 4:30 p.m.  Voicemails left after 4:00 p.m. will not be returned until the following business day.  For prescription refill requests, have your pharmacy contact our office and allow 72 hours.    Cancer Center Support Programs:   > Cancer Support Group  2nd Tuesday of the month 1pm-2pm, Journey Room    

## 2019-04-08 ENCOUNTER — Encounter: Payer: Self-pay | Admitting: Cardiology

## 2019-04-11 ENCOUNTER — Other Ambulatory Visit: Payer: Self-pay | Admitting: Nurse Practitioner

## 2019-04-11 DIAGNOSIS — R413 Other amnesia: Secondary | ICD-10-CM

## 2019-04-13 MED ORDER — DONEPEZIL HCL 10 MG PO TABS: ORAL_TABLET | ORAL | 0 refills | Status: DC

## 2019-04-13 NOTE — Addendum Note (Signed)
Addended by: Antonietta Barcelona D on: 04/13/2019 10:36 AM   Modules accepted: Orders

## 2019-05-17 ENCOUNTER — Telehealth: Payer: Self-pay | Admitting: *Deleted

## 2019-05-17 NOTE — Telephone Encounter (Signed)
LEFT MESSAGE ON ANSWER MACHINE - NEEDING TO SWITCH APPOINTMENT TO VIRTUAL FOR 05/20/19 - ANY QUESTION MAY CALL BACK IF IN OFFICE VISIT IS NEEDED

## 2019-05-20 ENCOUNTER — Encounter: Payer: Self-pay | Admitting: Cardiology

## 2019-05-20 ENCOUNTER — Telehealth (INDEPENDENT_AMBULATORY_CARE_PROVIDER_SITE_OTHER): Payer: Medicare Other | Admitting: Cardiology

## 2019-05-20 ENCOUNTER — Telehealth: Payer: Self-pay | Admitting: *Deleted

## 2019-05-20 VITALS — BP 126/68 | HR 68 | Ht 61.0 in | Wt 140.0 lb

## 2019-05-20 DIAGNOSIS — E785 Hyperlipidemia, unspecified: Secondary | ICD-10-CM

## 2019-05-20 DIAGNOSIS — I208 Other forms of angina pectoris: Secondary | ICD-10-CM

## 2019-05-20 DIAGNOSIS — I10 Essential (primary) hypertension: Secondary | ICD-10-CM

## 2019-05-20 DIAGNOSIS — I6523 Occlusion and stenosis of bilateral carotid arteries: Secondary | ICD-10-CM

## 2019-05-20 DIAGNOSIS — I350 Nonrheumatic aortic (valve) stenosis: Secondary | ICD-10-CM | POA: Diagnosis not present

## 2019-05-20 DIAGNOSIS — I2510 Atherosclerotic heart disease of native coronary artery without angina pectoris: Secondary | ICD-10-CM | POA: Diagnosis not present

## 2019-05-20 DIAGNOSIS — R634 Abnormal weight loss: Secondary | ICD-10-CM | POA: Insufficient documentation

## 2019-05-20 DIAGNOSIS — E1169 Type 2 diabetes mellitus with other specified complication: Secondary | ICD-10-CM

## 2019-05-20 MED ORDER — ASPIRIN EC 81 MG PO TBEC: 81.0000 mg | DELAYED_RELEASE_TABLET | Freq: Every day | ORAL | 3 refills | Status: DC

## 2019-05-20 NOTE — Progress Notes (Signed)
Virtual Visit via Video Note   This visit type was conducted due to national recommendations for restrictions regarding the COVID-19 Pandemic (e.g. social distancing) in an effort to limit this patient's exposure and mitigate transmission in our community.  Due to his co-morbid illnesses, this patient is at least at moderate risk for complications without adequate follow up.  This format is felt to be most appropriate for this patient at this time.  All issues noted in this document were discussed and addressed.  A limited physical exam was performed with this format.  Please refer to the patient's chart for his consent to telehealth for [facility].   Patient has given verbal permission to conduct this visit via virtual appointment and to bill insurance 05/21/2019 5:45 PM     Evaluation Performed:  Follow-up visit  Date:  05/21/2019   ID:  Dean GlassDaniel C Harris, DOB [birth year], MRN [id number]  Patient Location: Home Provider Location: Other:  Hospital office  PCP:  Bennie PieriniMartin, Mary-Margaret, FNP  Cardiologist:  No primary care provider on file. Bryan Lemmaavid Harding, MD Electrophysiologist:  None   Chief Complaint: Delayed annual follow-up  History of Present Illness:    Dean Harris is a 78 y.o. male with PMH notable for moderate aortic stenosis by echocardiogram as well as progressive dementia and chronic pulmonary condition as well as bilateral carotid artery disease (after an episode of amaurosis fugax in 2004) who presents via audio/video conferencing for a telehealth visit today.  Dean Harris was last seen in May 2019.  Was doing relatively well.  Was doing gardening and chores and short walks without chest discomfort or dyspnea.  Nothing more than his baseline exertional dyspnea.  Was having some issues with allergy related congestion but nothing significant. --   Interval History:  Dean Harris seems to be doing pretty well overall.  He is walking and exercising as much as he can do.  His wife  gets him out walking just about every day.  Does not really notice any shortness of breath even if he goes pretty fast.  What bothers him the most is the heat.  They try to do best he can to stay out of the high.  He is has issues with heat exhaustion and fatigue with dizziness etc. in the heat.  As long as he stays out of the heat, he does fine.  One thing that we noticed is that he is lost quite a bit of weight, and they were questioning him his nutrition status.  We talked about supplementing his diet with boost shakes or he generic [employer] version.  Could also consider Carnation Instant Breakfast etc.  Cardiovascular ROS: no chest pain or dyspnea on exertion positive for - just baseline DOE negative for - edema, irregular heartbeat, loss of consciousness, orthopnea, palpitations, paroxysmal nocturnal dyspnea, rapid heart rate, shortness of breath or syncope/near syncope (unless out in the heat too long), TIA/amuarosis fugax  He does note bruising.  The patient does not have symptoms concerning for COVID-19 infection (fever, chills, cough, or new shortness of breath).  The patient is practicing social distancing.  Granddaughter & Church friends help out with chores.  ROS:  Please see the history of present illness.    Review of Systems  Constitutional: Negative for malaise/fatigue.  HENT: Negative for congestion and nosebleeds.   Respiratory: Negative for cough, sputum production and wheezing.   Gastrointestinal: Negative for blood in stool, constipation, heartburn, melena and nausea.  Genitourinary: Negative for hematuria.  Musculoskeletal: Negative for falls, joint pain and myalgias.  Skin:       Had taken a Abx ~2 month ago after 2 Ticks found on her.  Neurological: Negative for dizziness, tingling, focal weakness and weakness.       Moves hands a lot in his sleep - "like he is trying to do something"  Endo/Heme/Allergies: Bruises/bleeds easily.  Psychiatric/Behavioral:  Positive for memory loss.  All other systems reviewed and are negative.   Past Medical History:  Diagnosis Date  . Chronic stable angina (HCC)   . 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 Depression   . Diabetes mellitus   . Essential hypertension   . Hyperlipidemia with target LDL less than 70   . 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 Pneumonia    x2  . PTSD (post-traumatic stress disorder) - with depression symptoms    Recently started on medication by the [place]. This has helped his memory issues.  . Thrombocytopenia (HCC) 04/29/2014  . Vitamin D deficiency     Past Surgical History:  Procedure Laterality Date  . CAROTID ENDARTERECTOMY    . LEFT HEART CATHETERIZATION WITH CORONARY ANGIOGRAM N/A 02/13/2012   Procedure: LEFT HEART CATHETERIZATION WITH CORONARY ANGIOGRAM;  Surgeon: Donato Schultz, MD;  Location: [facility] CATH LAB;  Service: Cardiovascular: Diffuse mid LAD 50%, 50%pOM1, 50%mRCA -> medical management  . TRANSTHORACIC ECHOCARDIOGRAM  01/2012   EF 55-60%. Mild LVH. No RWMA, mild Aortic Stenosis (mean/peak gradient 13 mmHg/21 mmHg)  . TRANSTHORACIC ECHOCARDIOGRAM  May 2017, May 2019   A) EF 60-65% w/o RWMA. Normal DF for age. Mod AD (mean/peak gradient 24/35 mmHg); b) EF 60-65%, mod LVH, Mod AS (Mean gradient (S): 28 mm Hg. Peak 43 mmHg  . US CAROTID DOPPLER BILATERAL ([facility] HX) Bilateral 02/05/2016   Stable mild-to-moderate disease with bilateral heterogeneous plaque. RICA - 1-39%, LICA 40-59%. Bilateral subclavian and vertebral arteries normal.     Current Meds  Medication Sig  . Accu-Chek Softclix Lancets lancets USE 1  TO CHECK GLUCOSE ONCE DAILY AS NEEDED Dx E11.9  . b complex vitamins tablet Take 1 tablet by mouth daily.  . Cholecalciferol (VITAMIN D) 2000 UNITS tablet Take 2,000 Units by mouth  daily.   Marland Kitchen donepezil (ARICEPT) 10 MG tablet TAKE 1/2 (ONE-HALF) TABLET BY MOUTH AT BEDTIME  . Fluticasone-Salmeterol (ADVAIR DISKUS) 100-50 MCG/DOSE AEPB Inhale 1 puff into the lungs every 12 (twelve) hours.  Marland Kitchen glucose blood (ACCU-CHEK AVIVA PLUS) test strip USE 1 STRIP TO CHECK GLUCOSE ONCE DAILY AND AS NEEDED  . Ipratropium-Albuterol (COMBIVENT) 20-100 MCG/ACT AERS respimat Inhale 1 puff into the lungs every 6 (six) hours.  . isosorbide mononitrate (IMDUR) 30 MG 24 hr tablet Take 1 tablet (30 mg total) by mouth daily.  Marland Kitchen lisinopril (PRINIVIL,ZESTRIL) 20 MG tablet Take 1 tablet (20 mg total) by mouth daily.  . memantine (NAMENDA) 10 MG tablet Take 1 tablet (10 mg total) by mouth 2 (two) times daily.  . nitroGLYCERIN (NITROSTAT) 0.4 MG SL tablet Use 1 tabket under tongue at onset and may repeat every 5 minutes x2  . sertraline (ZOLOFT) 100 MG tablet Take 1 tablet (100 mg total) by mouth daily.  . [DISCONTINUED] aspirin 325 MG tablet Take 325 mg by mouth daily.     Allergies:   Dust mite extract, Mold extract [trichophyton], Pollen  extract, and Tetracyclines & related   Social History   Tobacco Use  . Smoking status: Former Smoker    Types: Cigarettes    Quit date: 02/10/1988    Years since quitting: 31.2  . Smokeless tobacco: Never Used  Substance Use Topics  . Alcohol use: No  . Drug use: No     Family Hx: The patient's family history includes Cancer in his father and another family member; Diabetes in his mother; Hyperlipidemia in his mother; Hypertension in his brother and mother; Stroke in his mother.   Prior CV studies:   The following studies were reviewed today: . Echocardiogram May 2019: EF 55-6%.  No R WMA.  GR 1 DD.  Moderate aortic stenosis (mean gradient 28 mmHg, peak 43 mmHg) -relatively stable  Labs/Other Tests and Data Reviewed:    EKG:  No ECG reviewed.  Recent Labs: 03/09/2019: ALT 21; BUN 22; Creatinine, Ser 1.13; Hemoglobin 12.8; Platelets 124; Potassium 4.4;  Sodium 136   Recent Lipid Panel  Lab Results  Component Value Date   CHOL 206 (H) 09/10/2018   HDL 44 09/10/2018   LDLCALC 121 (H) 09/10/2018   TRIG 205 (H) 09/10/2018   CHOLHDL 4.7 09/10/2018    Wt Readings from Last 3 Encounters:  05/20/19 140 lb (63.5 kg)  03/09/19 147 lb 1.6 oz (66.7 kg)  09/17/18 154 lb (69.9 kg)    Objective:    Vital Signs:  BP 126/68   Pulse 68   Ht 5\' 1"  (1.549 m)   Wt 140 lb (63.5 kg)   BMI 26.45 kg/m   VITAL SIGNS:  reviewed GEN:  Well nourished, well developed male in no acute distress. RESPIRATORY:  normal respiratory effort, symmetric expansion CARDIOVASCULAR:  no peripheral edema MUSCULOSKELETAL:  no obvious deformities. NEURO:  He is awake and alert, oriented to place.  Poor memory.  Poor word finding.  Difficult historian.  Usually data provided by his spouse. PSYCH:  Somewhat blunted affect.  Normal mood   ASSESSMENT & PLAN:    Problem List Items Addressed This Visit    Weight loss, non-intentional   Moderate aortic stenosis - Primary (Chronic)    Echo May of last year showed pretty much stable findings.  I think were okay holding off until summer 2021 to recheck an echocardiogram.  At that point, depending on his level of dementia.  We may discuss whether not we do any further testing.  Discussed concerning symptoms for symptomatic aortic stenosis including angina, CHF and syncope.      Relevant Medications   aspirin EC 81 MG tablet   Hyperlipidemia associated with type 2 diabetes mellitus ([place]) (Chronic)    His cholesterol level seems to be taken turn for the worse.  LDL is now 121.  I do suspect that we probably will need to add some type of agent.  I have been reluctant to start statin because of his dementia issues.  Could consider Livalo, Nexletol and even potentially PCSK9 years. He apparently had labs checked by the [place] to try to get those results back.  Depending on how those look, I would probably start with low-dose  Livalo plus Zetia.      Relevant Medications   aspirin EC 81 MG tablet   Essential hypertension, benign (Chronic)    Blood pressure looks good on lisinopril and Imdur.      Relevant Medications   aspirin EC 81 MG tablet   Coronary artery disease, non-occlusive (Chronic)   Relevant Medications  aspirin EC 81 MG tablet   Other Relevant Orders   VAS US CAROTID   Chronic stable angina (HCC) (Chronic)    Remains asymptomatic.  History of nonocclusive CAD by cath.  Suspect he may have some microvascular disease and was started on Imdur.  We have not beta-blocker in the past mostly because of issues with fatigue.      Relevant Medications   aspirin EC 81 MG tablet   Carotid artery plaque, bilateral; s/p Bilateral CEA (Chronic)    S/p CEA -due for follow-up carotid Dopplers.  Has been since 2017.      Relevant Medications   aspirin EC 81 MG tablet   Other Relevant Orders   VAS US CAROTID     --For some reason, he still on full dose aspirin.  They noted some bruising.  I have him hold aspirin for couple days and then start back on baby aspirin.  COVID-19 Education: The signs and symptoms of COVID-19 were discussed with the patient and how to seek care for testing (follow up with PCP or arrange E-visit).   The importance of social distancing was discussed today.  Time:   Today, I have spent 20 minutes with the patient with telehealth technology discussing the above problems.     Medication Adjustments/Labs and Tests Ordered: Current medicines are reviewed at length with the patient today.  Concerns regarding medicines are outlined above.  Medication Instructions:   Hold aspirin for 2 days then switch to taking the 81 mg/baby aspirin instead of the full dose 325 mg aspirin.  Tests Ordered: Orders Placed This Encounter  Procedures  . VAS US CAROTID    Medication Changes: Meds ordered this encounter  Medications  . aspirin EC 81 MG tablet    Sig: Take 1 tablet (81 mg  total) by mouth daily.    Dispense:  90 tablet    Refill:  3    Disposition:  Follow up in 1 year(s)    Signed, Bryan Lemmaavid Harding, MD  05/21/2019 5:45 PM    [facility]

## 2019-05-20 NOTE — Telephone Encounter (Signed)
SPOKE TO PATIENT'S WIFE  INSTRUCTION WAS GIVEN FROM TODAY'S VIRTUAL VISIT.  AVS SUMMARY SENT VIA MYCHART.   OFFICE ADDRESS GIVEN .     ORDER  PLACED FOR CAROTID  ,   WIFE AWARE  VERBALIZED UNDERSTANDING.

## 2019-05-20 NOTE — Patient Instructions (Addendum)
Medication Instructions:   Hold aspirin for 2 days then switch to taking the 81 mg/baby aspirin instead of the full dose 325 mg aspirin.  If you need a refill on your cardiac medications before your next appointment, please call your pharmacy.   Lab work: -- labs checked @ [facility] -- pls have [facility] (or patient's wife send copy to our office) -> we may recommend some medication changes based on this.  If you have labs (blood work) drawn today and your tests are completely normal, you will receive your results only by: Marland Kitchen MyChart Message (if you have MyChart) OR . A paper copy in the mail If you have any lab test that is abnormal or we need to change your treatment, we will call you to review the results.  Testing/Procedures:  Carotid Dopplers (were due in May - were rescheduled)  Follow-Up: At [facility], you and your health needs are our priority.  As part of our continuing mission to provide you with exceptional heart care, we have created designated Provider Care Teams.  These Care Teams include your primary Cardiologist (physician) and Advanced Practice Providers (APPs -  Physician Assistants and Nurse Practitioners) who all work together to provide you with the care you need, when you need it. . You will need a follow up appointment in  12  Months SEPT 2021.  Please call our office 2 months in advance to schedule this appointment.  You may see Glenetta Hew, MD or one of the following Advanced Practice Providers on your designated Care Team:   . Rosaria Ferries, PA-C . Jory Sims, DNP, ANP  Any Other Special Instructions Will Be Listed Below (If Applicable).  Recommend supplementing his meals with protein shakes like Boost (or the [employer] generic version, can even use [employer]). If he likes them eat extra eggs with meals that is another option.

## 2019-05-21 ENCOUNTER — Encounter: Payer: Self-pay | Admitting: Cardiology

## 2019-05-21 NOTE — Assessment & Plan Note (Signed)
Remains asymptomatic.  History of nonocclusive CAD by cath.  Suspect he may have some microvascular disease and was started on Imdur.  We have not beta-blocker in the past mostly because of issues with fatigue.

## 2019-05-21 NOTE — Assessment & Plan Note (Signed)
Blood pressure looks good on lisinopril and Imdur.

## 2019-05-21 NOTE — Assessment & Plan Note (Signed)
His cholesterol level seems to be taken turn for the worse.  LDL is now 121.  I do suspect that we probably will need to add some type of agent.  I have been reluctant to start statin because of his dementia issues.  Could consider Livalo, Nexletol and even potentially PCSK9 years. He apparently had labs checked by the [facility] to try to get those results back.  Depending on how those look, I would probably start with low-dose Livalo plus Zetia.

## 2019-05-21 NOTE — Assessment & Plan Note (Signed)
Echo May of last year showed pretty much stable findings.  I think were okay holding off until summer 2021 to recheck an echocardiogram.  At that point, depending on his level of dementia.  We may discuss whether not we do any further testing.  Discussed concerning symptoms for symptomatic aortic stenosis including angina, CHF and syncope.

## 2019-05-21 NOTE — Assessment & Plan Note (Signed)
S/p CEA -due for follow-up carotid Dopplers.  Has been since 2017.

## 2019-05-28 ENCOUNTER — Other Ambulatory Visit: Payer: Self-pay | Admitting: Nurse Practitioner

## 2019-05-28 DIAGNOSIS — I10 Essential (primary) hypertension: Secondary | ICD-10-CM

## 2019-06-08 ENCOUNTER — Other Ambulatory Visit: Payer: Self-pay

## 2019-06-08 ENCOUNTER — Other Ambulatory Visit (HOSPITAL_COMMUNITY): Payer: Self-pay | Admitting: Cardiology

## 2019-06-08 ENCOUNTER — Ambulatory Visit (HOSPITAL_COMMUNITY)
Admission: RE | Admit: 2019-06-08 | Discharge: 2019-06-08 | Disposition: A | Discharge status: Home / Self Care | Payer: Medicare Other | Source: Ambulatory Visit | Attending: Cardiovascular Disease | Admitting: Cardiovascular Disease

## 2019-06-08 DIAGNOSIS — I6523 Occlusion and stenosis of bilateral carotid arteries: Secondary | ICD-10-CM | POA: Insufficient documentation

## 2019-06-08 DIAGNOSIS — I2510 Atherosclerotic heart disease of native coronary artery without angina pectoris: Secondary | ICD-10-CM

## 2019-06-08 DIAGNOSIS — Z9889 Other specified postprocedural states: Secondary | ICD-10-CM

## 2019-06-10 NOTE — Progress Notes (Signed)
Addendum: :  Recent Labs: Labs from [facility] from 04/08/2019  K+ 4.7, HCO3- 30 , BUN 24, Cr 1.24, Glu 134, Ca2+ 8.7; AST 13, ALT 30, AlkP 64  CBC: W 6.18, H/H 14.3/42.1, Plt 149  Total cholesterol 182, triglycerides 139, HDL 46, LDL 108.  A1c 7.6.   These labs look like the LDL is better than it had been at 121.  However I would like for the LDL to be less than 100.  Based on our discussion, I think may be the best choice will be to just not treat and continue to watch diet and exercise.  Glenetta Hew, MD

## 2019-06-17 ENCOUNTER — Telehealth: Payer: Self-pay | Admitting: *Deleted

## 2019-06-17 DIAGNOSIS — I2510 Atherosclerotic heart disease of native coronary artery without angina pectoris: Secondary | ICD-10-CM

## 2019-06-17 DIAGNOSIS — I6523 Occlusion and stenosis of bilateral carotid arteries: Secondary | ICD-10-CM

## 2019-06-17 NOTE — Telephone Encounter (Signed)
The patient's wife has been notified of the result and verbalized understanding.  All questions (if any) were answered. Raiford Simmonds, RN 06/17/2019 5:44 PM  ordered placed for 2021

## 2019-06-17 NOTE — Telephone Encounter (Signed)
-----   Message from Leonie Man, MD sent at 06/16/2019  3:49 AM EDT ----- Carotid Doppler results show stable right carotid disease after endarterectomy.  Left side internal carotid artery shows a little bit of progression of disease from last evaluation, but not significant. 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  Relatively stable findings.  Can follow-up in 1 year. Glenetta Hew, MD

## 2019-06-25 ENCOUNTER — Other Ambulatory Visit: Payer: Self-pay | Admitting: Nurse Practitioner

## 2019-06-25 DIAGNOSIS — R413 Other amnesia: Secondary | ICD-10-CM

## 2019-07-11 ENCOUNTER — Ambulatory Visit (INDEPENDENT_AMBULATORY_CARE_PROVIDER_SITE_OTHER): Payer: Medicare Other | Admitting: Nurse Practitioner

## 2019-07-11 ENCOUNTER — Encounter: Payer: Self-pay | Admitting: Nurse Practitioner

## 2019-07-11 DIAGNOSIS — E1169 Type 2 diabetes mellitus with other specified complication: Secondary | ICD-10-CM | POA: Diagnosis not present

## 2019-07-11 DIAGNOSIS — F339 Major depressive disorder, recurrent, unspecified: Secondary | ICD-10-CM

## 2019-07-11 DIAGNOSIS — M1009 Idiopathic gout, multiple sites: Secondary | ICD-10-CM

## 2019-07-11 DIAGNOSIS — E1142 Type 2 diabetes mellitus with diabetic polyneuropathy: Secondary | ICD-10-CM | POA: Diagnosis not present

## 2019-07-11 DIAGNOSIS — G6281 Critical illness polyneuropathy: Secondary | ICD-10-CM | POA: Diagnosis not present

## 2019-07-11 DIAGNOSIS — D696 Thrombocytopenia, unspecified: Secondary | ICD-10-CM

## 2019-07-11 DIAGNOSIS — I10 Essential (primary) hypertension: Secondary | ICD-10-CM | POA: Diagnosis not present

## 2019-07-11 DIAGNOSIS — Z6830 Body mass index (BMI) 30.0-30.9, adult: Secondary | ICD-10-CM

## 2019-07-11 DIAGNOSIS — G4730 Sleep apnea, unspecified: Secondary | ICD-10-CM

## 2019-07-11 DIAGNOSIS — J42 Unspecified chronic bronchitis: Secondary | ICD-10-CM

## 2019-07-11 DIAGNOSIS — I2510 Atherosclerotic heart disease of native coronary artery without angina pectoris: Secondary | ICD-10-CM

## 2019-07-11 DIAGNOSIS — E785 Hyperlipidemia, unspecified: Secondary | ICD-10-CM

## 2019-07-11 DIAGNOSIS — R413 Other amnesia: Secondary | ICD-10-CM

## 2019-07-11 DIAGNOSIS — I208 Other forms of angina pectoris: Secondary | ICD-10-CM

## 2019-07-11 MED ORDER — ISOSORBIDE MONONITRATE ER 30 MG PO TB24: 30.0000 mg | ORAL_TABLET | Freq: Every day | ORAL | 1 refills | Status: DC

## 2019-07-11 MED ORDER — DONEPEZIL HCL 10 MG PO TABS: ORAL_TABLET | ORAL | 1 refills | Status: DC

## 2019-07-11 MED ORDER — MEMANTINE HCL 10 MG PO TABS: 10.0000 mg | ORAL_TABLET | Freq: Two times a day (BID) | ORAL | 1 refills | Status: DC

## 2019-07-11 MED ORDER — IPRATROPIUM-ALBUTEROL 20-100 MCG/ACT IN AERS: 1.0000 | INHALATION_SPRAY | Freq: Four times a day (QID) | RESPIRATORY_TRACT | 1 refills | Status: DC

## 2019-07-11 MED ORDER — LISINOPRIL 20 MG PO TABS: 20.0000 mg | ORAL_TABLET | Freq: Every day | ORAL | 1 refills | Status: DC

## 2019-07-11 MED ORDER — FLUTICASONE-SALMETEROL 100-50 MCG/DOSE IN AEPB: 1.0000 | INHALATION_SPRAY | Freq: Two times a day (BID) | RESPIRATORY_TRACT | 1 refills | Status: DC

## 2019-07-11 MED ORDER — SERTRALINE HCL 100 MG PO TABS: 100.0000 mg | ORAL_TABLET | Freq: Every day | ORAL | 1 refills | Status: DC

## 2019-07-11 NOTE — Progress Notes (Signed)
Virtual Visit via telephone Note Due to COVID-19 pandemic this visit was conducted virtually. This visit type was conducted due to national recommendations for restrictions regarding the COVID-19 Pandemic (e.g. social distancing, sheltering in place) in an effort to limit this patient's exposure and mitigate transmission in our community. All issues noted in this document were discussed and addressed.  A physical exam was not performed with this format.  I connected with Dean Harris on 07/11/19 at 9:15 by telephone and verified that I am speaking with the correct person using two identifiers. Dean Harris is currently located at home and hishim is currently with him during visit. The provider, Mary-Margaret Hassell Done, FNP is located in their office at time of visit.  I discussed the limitations, risks, security and privacy concerns of performing an evaluation and management service by telephone and the availability of in person appointments. I also discussed with the patient that there may be a patient responsible charge related to this service. The patient expressed understanding and agreed to proceed.   History and Present Illness:   Chief Complaint: Medical Management of Chronic Issues   * all information obtained from wife  HPI:  37. Essential hypertension, benign No c/o chest pain, sob or headache. Does not check blood pressure at home. BP Readings from Last 3 Encounters:  05/20/19 126/68  03/09/19 (!) 146/51  09/17/18 133/64     2. Type 2 diabetes mellitus with diabetic polyneuropathy, without long-term current use of insulin (HCC) His blood sugars are running around 120-140 most days. Wife deneis any low blood sugars. Lab Results  Component Value Date   HGBA1C 6.2 09/10/2018     3. Hyperlipidemia associated with type 2 diabetes mellitus ([place]) His wife tries to watch what food she gives him. Lab Results  Component Value Date   CHOL 206 (H) 09/10/2018   HDL 44  09/10/2018   LDLCALC 121 (H) 09/10/2018   TRIG 205 (H) 09/10/2018   CHOLHDL 4.7 09/10/2018     4. Polyneuropathy associated with critical illness (HCC) Has burning in feet. Wife does not let him go without shoes  5. Chronic stable angina (HCC) No recent complaints of chest pain. Sees cardiology at [facility]  6. Chronic bronchitis, unspecified chronic bronchitis type ([place]) No recent cough, uses advair and combivent daily.  7. Sleep apnea, unspecified type Wears CPAP nightly  8. Depression, recurrent ([place]) On zoloft and wife thinks he os doing well.  9. Memory disorder His memory is slowly worsening and is on namenda and aricept. He is starting to not recognize people. He is still able ADL's but has to have someone there to watch him and help when needed.  10. Idiopathic gout of multiple sites, unspecified chronicity No recent flare ups  11. Thrombocytopenia ([place]) Lab Results  Component Value Date   WBC 5.1 03/09/2019   HGB 12.8 (L) 03/09/2019   HCT 38.6 (L) 03/09/2019   MCV 89.4 03/09/2019   PLT 124 (L) 03/09/2019     12. BMI 30.0-30.9,adult Weight today was  140lbs Wt Readings from Last 3 Encounters:  05/20/19 140 lb (63.5 kg)  03/09/19 147 lb 1.6 oz (66.7 kg)  09/17/18 154 lb (69.9 kg)    BMI Readings from Last 3 Encounters:  05/20/19 26.45 kg/m  03/09/19 27.79 kg/m  09/17/18 29.10 kg/m      Outpatient Encounter Medications as of 07/11/2019  Medication Sig   Accu-Chek Softclix Lancets lancets USE 1  TO CHECK GLUCOSE ONCE DAILY AS NEEDED  Dx E11.9   aspirin EC 81 MG tablet Take 1 tablet (81 mg total) by mouth daily.   b complex vitamins tablet Take 1 tablet by mouth daily.   Cholecalciferol (VITAMIN D) 2000 UNITS tablet Take 2,000 Units by mouth daily.    donepezil (ARICEPT) 10 MG tablet TAKE 1/2 (ONE-HALF) TABLET BY MOUTH AT BEDTIME (Needs to be seen before next refill)   Fluticasone-Salmeterol (ADVAIR DISKUS) 100-50 MCG/DOSE AEPB Inhale 1 puff into the  lungs every 12 (twelve) hours.   glucose blood (ACCU-CHEK AVIVA PLUS) test strip Test blood sugar daily and as needed Dx E11.9   Ipratropium-Albuterol (COMBIVENT) 20-100 MCG/ACT AERS respimat Inhale 1 puff into the lungs every 6 (six) hours.   isosorbide mononitrate (IMDUR) 30 MG 24 hr tablet Take 1 tablet (30 mg total) by mouth daily.   lisinopril (ZESTRIL) 20 MG tablet Take 1 tablet by mouth once daily   memantine (NAMENDA) 10 MG tablet Take 1 tablet (10 mg total) by mouth 2 (two) times daily.   nitroGLYCERIN (NITROSTAT) 0.4 MG SL tablet Use 1 tabket under tongue at onset and may repeat every 5 minutes x2   sertraline (ZOLOFT) 100 MG tablet Take 1 tablet (100 mg total) by mouth daily.     Past Surgical History:  Procedure Laterality Date   CAROTID ENDARTERECTOMY     LEFT HEART CATHETERIZATION WITH CORONARY ANGIOGRAM N/A 02/13/2012   Procedure: LEFT HEART CATHETERIZATION WITH CORONARY ANGIOGRAM;  Surgeon: Donato Schultz, MD;  Location: [facility] CATH LAB;  Service: Cardiovascular: Diffuse mid LAD 50%, 50%pOM1, 50%mRCA -> medical management   TRANSTHORACIC ECHOCARDIOGRAM  01/2012   EF 55-60%. Mild LVH. No RWMA, mild Aortic Stenosis (mean/peak gradient 13 mmHg/21 mmHg)   TRANSTHORACIC ECHOCARDIOGRAM  May 2017, May 2019   A) EF 60-65% w/o RWMA. Normal DF for age. Mod AD (mean/peak gradient 24/35 mmHg); b) EF 60-65%, mod LVH, Mod AS (Mean gradient (S): 28 mm Hg. Peak 43 mmHg   US CAROTID DOPPLER BILATERAL ([facility] HX) Bilateral 02/05/2016   Stable mild-to-moderate disease with bilateral heterogeneous plaque. RICA - 1-39%, LICA 40-59%. Bilateral subclavian and vertebral arteries normal.    Family History  Problem Relation Age of Onset   Diabetes Mother    Stroke Mother    Hypertension Mother    Hyperlipidemia Mother    Cancer Father    Hypertension Brother    Cancer Other     New complaints: None today  Social history: His wife is his primary care giver.  Controlled  substance contract: n/a     Review of Systems  Constitutional: Negative for diaphoresis and weight loss.  Eyes: Negative for blurred vision, double vision and pain.  Respiratory: Negative for shortness of breath.   Cardiovascular: Negative for chest pain, palpitations, orthopnea and leg swelling.  Gastrointestinal: Negative for abdominal pain.  Skin: Negative for rash.  Neurological: Negative for dizziness, sensory change, loss of consciousness, weakness and headaches.  Endo/Heme/Allergies: Negative for polydipsia. Does not bruise/bleed easily.  Psychiatric/Behavioral: Positive for memory loss. The patient does not have insomnia.   All other systems reviewed and are negative.    Observations/Objective: Alert and oriented- answers all questions appropriately No distress  * unable to speak with patient due to memory loss and some hearing difficulity over the phone.   Assessment and Plan: DRAYDEN LUKAS comes in today with chief complaint of Medical Management of Chronic Issues   Diagnosis and orders addressed:  1. Essential hypertension, benign Low sodium diet - lisinopril (ZESTRIL) 20  MG tablet; Take 1 tablet (20 mg total) by mouth daily.  Dispense: 90 tablet; Refill: 1  2. Type 2 diabetes mellitus with diabetic polyneuropathy, without long-term current use of insulin (HCC) continue to watch carbs in diet  3. Hyperlipidemia associated with type 2 diabetes mellitus (HCC) Low fat diet  4. Polyneuropathy associated with critical illness (HCC) Do not go barefooted- wife to check feet daiy  5. Chronic stable angina (HCC) Continue imdur  6. Chronic bronchitis, unspecified chronic bronchitis type (HCC) Avoid covid as much as possible - Ipratropium-Albuterol (COMBIVENT) 20-100 MCG/ACT AERS respimat; Inhale 1 puff into the lungs every 6 (six) hours.  Dispense: 12 g; Refill: 1 - Fluticasone-Salmeterol (ADVAIR DISKUS) 100-50 MCG/DOSE AEPB; Inhale 1 puff into the lungs every 12  (twelve) hours.  Dispense: 180 each; Refill: 1  7. Sleep apnea, unspecified type Continue CPAP  8. Depression, recurrent (HCC) Stress management - sertraline (ZOLOFT) 100 MG tablet; Take 1 tablet (100 mg total) by mouth daily.  Dispense: 90 tablet; Refill: 1  9. Memory disorder [place] daiy - donepezil (ARICEPT) 10 MG tablet; TAKE 1/2 (ONE-HALF) TABLET BY MOUTH AT BEDTIME (Needs to be seen before next refill)  Dispense: 45 tablet; Refill: 1 - memantine (NAMENDA) 10 MG tablet; Take 1 tablet (10 mg total) by mouth 2 (two) times daily.  Dispense: 180 tablet; Refill: 1  10. Idiopathic gout of multiple sites, unspecified chronicity Low purine diet  11. Thrombocytopenia (HCC)  12. BMI 30.0-30.9,adult Continue daiy boost  13. Coronary artery disease, non-occlusive - isosorbide mononitrate (IMDUR) 30 MG 24 hr tablet; Take 1 tablet (30 mg total) by mouth daily.  Dispense: 90 tablet; Refill: 1   Labs pending Health Maintenance reviewed Diet and exercise encouraged  Follow up plan: 3 months     I discussed the assessment and treatment plan with the patient. The patient was provided an opportunity to ask questions and all were answered. The patient agreed with the plan and demonstrated an understanding of the instructions.   The patient was advised to call back or seek an in-person evaluation if the symptoms worsen or if the condition fails to improve as anticipated.  The above assessment and management plan was discussed with the patient. The patient verbalized understanding of and has agreed to the management plan. Patient is aware to call the clinic if symptoms persist or worsen. Patient is aware when to return to the clinic for a follow-up visit. Patient educated on when it is appropriate to go to the emergency department.   Time call ended:  9:45  I provided 30 minutes of non-face-to-face time during this encounter.    Mary-Margaret Daphine DeutscherMartin, FNP

## 2019-08-16 ENCOUNTER — Telehealth: Payer: Self-pay | Admitting: Nurse Practitioner

## 2019-08-17 MED ORDER — ACCU-CHEK SOFTCLIX LANCETS MISC: 3 refills | Status: DC

## 2019-08-17 MED ORDER — ACCU-CHEK AVIVA PLUS W/DEVICE KIT: PACK | 0 refills | Status: DC

## 2019-08-17 MED ORDER — ACCU-CHEK AVIVA PLUS VI STRP: ORAL_STRIP | 3 refills | Status: DC

## 2019-08-17 NOTE — Telephone Encounter (Signed)
LMOVM meter, ts & lancets sent to pharmacy

## 2019-09-27 ENCOUNTER — Inpatient Hospital Stay (HOSPITAL_COMMUNITY): Payer: Medicare PPO

## 2019-09-27 ENCOUNTER — Encounter (HOSPITAL_COMMUNITY): Payer: Self-pay | Admitting: Nurse Practitioner

## 2019-09-27 ENCOUNTER — Other Ambulatory Visit: Payer: Self-pay

## 2019-09-27 ENCOUNTER — Inpatient Hospital Stay (HOSPITAL_COMMUNITY): Payer: Medicare PPO | Attending: Nurse Practitioner | Admitting: Nurse Practitioner

## 2019-09-27 DIAGNOSIS — D649 Anemia, unspecified: Secondary | ICD-10-CM | POA: Diagnosis not present

## 2019-09-27 DIAGNOSIS — D696 Thrombocytopenia, unspecified: Secondary | ICD-10-CM

## 2019-09-27 DIAGNOSIS — Z87891 Personal history of nicotine dependence: Secondary | ICD-10-CM | POA: Insufficient documentation

## 2019-09-27 LAB — COMPREHENSIVE METABOLIC PANEL
ALT: 21 U/L (ref 0–44)
AST: 16 U/L (ref 15–41)
Albumin: 4.3 g/dL (ref 3.5–5.0)
Alkaline Phosphatase: 56 U/L (ref 38–126)
Anion gap: 7 (ref 5–15)
BUN: 33 mg/dL — ABNORMAL HIGH (ref 8–23)
CO2: 27 mmol/L (ref 22–32)
Calcium: 9.2 mg/dL (ref 8.9–10.3)
Chloride: 103 mmol/L (ref 98–111)
Creatinine, Ser: 1.29 mg/dL — ABNORMAL HIGH (ref 0.61–1.24)
GFR calc Af Amer: >60 mL/min (ref >60)
GFR calc non Af Amer: 53 mL/min — ABNORMAL LOW (ref >60)
Glucose, Bld: 256 mg/dL — ABNORMAL HIGH (ref 70–99)
Potassium: 4.7 mmol/L (ref 3.5–5.1)
Sodium: 137 mmol/L (ref 135–145)
Total Bilirubin: 1.3 mg/dL — ABNORMAL HIGH (ref 0.3–1.2)
Total Protein: 6.9 g/dL (ref 6.5–8.1)

## 2019-09-27 LAB — CBC WITH DIFFERENTIAL/PLATELET
Abs Immature Granulocytes: 0.01 10*3/uL (ref 0.00–0.07)
Basophils Absolute: 0.1 10*3/uL (ref 0.0–0.1)
Basophils Relative: 1 %
Eosinophils Absolute: 0.2 10*3/uL (ref 0.0–0.5)
Eosinophils Relative: 2 %
HCT: 43.3 % (ref 39.0–52.0)
Hemoglobin: 14 g/dL (ref 13.0–17.0)
Immature Granulocytes: 0 %
Lymphocytes Relative: 29 %
Lymphs Abs: 1.8 10*3/uL (ref 0.7–4.0)
MCH: 29.9 pg (ref 26.0–34.0)
MCHC: 32.3 g/dL (ref 30.0–36.0)
MCV: 92.5 fL (ref 80.0–100.0)
Monocytes Absolute: 0.3 10*3/uL (ref 0.1–1.0)
Monocytes Relative: 5 %
Neutro Abs: 3.9 10*3/uL (ref 1.7–7.7)
Neutrophils Relative %: 63 %
Platelets: 135 10*3/uL — ABNORMAL LOW (ref 150–400)
RBC: 4.68 MIL/uL (ref 4.22–5.81)
RDW: 12.7 % (ref 11.5–15.5)
WBC: 6.3 10*3/uL (ref 4.0–10.5)
nRBC: 0 % (ref 0.0–0.2)

## 2019-09-27 LAB — FOLATE: Folate: 50.3 ng/mL (ref >5.9)

## 2019-09-27 LAB — VITAMIN B12: Vitamin B-12: 659 pg/mL (ref 180–914)

## 2019-09-27 LAB — FERRITIN: Ferritin: 83 ng/mL (ref 24–336)

## 2019-09-27 LAB — LACTATE DEHYDROGENASE: LDH: 121 U/L (ref 98–192)

## 2019-09-27 LAB — IRON AND TIBC
Iron: 104 ug/dL (ref 45–182)
Saturation Ratios: 31 % (ref 17.9–39.5)
TIBC: 332 ug/dL (ref 250–450)
UIBC: 228 ug/dL

## 2019-09-27 NOTE — Progress Notes (Signed)
Dean Harris, [place] [address]   CLINIC:  Medical Oncology/Hematology  PCP:  Chevis Pretty, [place] [address] [phone]   REASON FOR VISIT: Follow-up for thrombocytopenia  CURRENT THERAPY: Observation   INTERVAL HISTORY:  Dean Harris 79 y.o. male returns for routine follow-up for thrombocytopenia. He reports he has been doing well since his last visit. He has no complaints at this time. Denies any nausea, vomiting, or diarrhea. Denies any new pains. Had not noticed any recent bleeding such as epistaxis, hematuria or hematochezia. Denies recent chest pain on exertion, shortness of breath on minimal exertion, pre-syncopal episodes, or palpitations. Denies any numbness or tingling in hands or feet. Denies any recent fevers, infections, or recent hospitalizations. Patient reports appetite at 100% and energy level at 50%.  He is eating well maintain his weight at this time.    REVIEW OF SYSTEMS:  Review of Systems  All other systems reviewed and are negative.    PAST MEDICAL/SURGICAL HISTORY:  Past Medical History:  Diagnosis Date  . Chronic stable angina ([place])   . 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 Depression   . Diabetes mellitus   . Essential hypertension   . Hyperlipidemia with target LDL less than 70   . 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 Pneumonia    x2  . 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 This has helped his memory issues.  . Thrombocytopenia ([place]) 04/29/2014  . Vitamin D deficiency    Past Surgical History:  Procedure Laterality Date  . CAROTID ENDARTERECTOMY    . LEFT HEART CATHETERIZATION WITH CORONARY ANGIOGRAM N/A 02/13/2012   Procedure: LEFT HEART CATHETERIZATION WITH CORONARY ANGIOGRAM;  Surgeon: Candee Furbish, MD;  Location: [facility] CATH LAB;  Service: Cardiovascular: Diffuse mid LAD 50%, 50%pOM1, 50%mRCA -> medical management  . TRANSTHORACIC ECHOCARDIOGRAM  01/2012   EF 55-60%. Mild LVH. No RWMA, mild Aortic Stenosis (mean/peak gradient 13 mmHg/21 mmHg)  . TRANSTHORACIC ECHOCARDIOGRAM  May 2017, May 2019   A) EF 60-65% w/o RWMA. Normal DF for age. Mod AD (mean/peak gradient 24/35 mmHg); b) EF 60-65%, mod LVH, Mod AS (Mean gradient (S): 28 mm Hg. Peak 43 mmHg  . US CAROTID DOPPLER BILATERAL ([facility] HX) Bilateral 02/05/2016   Stable mild-to-moderate disease with bilateral heterogeneous plaque. RICA - 8-75%, LICA 79-72%. Bilateral subclavian and vertebral arteries normal.     SOCIAL HISTORY:  Social History   Socioeconomic History  . Marital status: Married    Spouse name: Vaughan Basta  . Number of children: 1  . Years of education: 79  . Highest education level: Not on file  Occupational History  . Occupation: Retired     Comment: Custodian  Tobacco Use  . Smoking status: Former Smoker    Types: Cigarettes    Quit date: 02/10/1988    Years since quitting: 31.6  . Smokeless tobacco: Never Used  Substance and Sexual Activity  . Alcohol use: No  . Drug use: No  . Sexual activity: Not on file    Comment: Married  Other Topics Concern  . Not on file  Social History Narrative   Lives at home w/ wife   Right-handed   Caffeine: 5-6 cups per day  Social Determinants of Health   Financial Resource Strain:   . Difficulty of Paying Living Expenses: Not on file  Food Insecurity:   . Worried About Charity fundraiser in the Last Year: Not on file  . Ran Out of Food in the Last Year: Not on file  Transportation Needs:   . Lack of Transportation (Medical): Not on file  . Lack of Transportation (Non-Medical): Not on file  Physical Activity:   . Days of Exercise per Week: Not on file  . Minutes of Exercise per  Session: Not on file  Stress:   . Feeling of Stress : Not on file  Social Connections:   . Frequency of Communication with Friends and Family: Not on file  . Frequency of Social Gatherings with Friends and Family: Not on file  . Attends Religious Services: Not on file  . Active Member of Clubs or Organizations: Not on file  . Attends Archivist Meetings: Not on file  . Marital Status: Not on file  Intimate Partner Violence:   . Fear of Current or Ex-Partner: Not on file  . Emotionally Abused: Not on file  . Physically Abused: Not on file  . Sexually Abused: Not on file    FAMILY HISTORY:  Family History  Problem Relation Age of Onset  . Diabetes Mother   . Stroke Mother   . Hypertension Mother   . Hyperlipidemia Mother   . Cancer Father   . Hypertension Brother   . Cancer Other     CURRENT MEDICATIONS:  Outpatient Encounter Medications as of 09/27/2019  Medication Sig  . Accu-Chek Softclix Lancets lancets CHECK BLOOD SUGAR DAILY AND AS NEEDED Dx E11.9  . aspirin EC 81 MG tablet Take 1 tablet (81 mg total) by mouth daily.  Marland Kitchen b complex vitamins tablet Take 1 tablet by mouth daily.  . Blood Glucose Monitoring Suppl (ACCU-CHEK AVIVA PLUS) w/Device KIT Check blood sugar daily and as needed  . Cholecalciferol (VITAMIN D) 2000 UNITS tablet Take 2,000 Units by mouth daily.   Marland Kitchen donepezil (ARICEPT) 10 MG tablet TAKE 1/2 (ONE-HALF) TABLET BY MOUTH AT BEDTIME (Needs to be seen before next refill)  . Fluticasone-Salmeterol (ADVAIR DISKUS) 100-50 MCG/DOSE AEPB Inhale 1 puff into the lungs every 12 (twelve) hours.  Marland Kitchen glucose blood (ACCU-CHEK AVIVA PLUS) test strip Test blood sugar daily and as needed Dx E11.9  . Ipratropium-Albuterol (COMBIVENT) 20-100 MCG/ACT AERS respimat Inhale 1 puff into the lungs every 6 (six) hours.  . isosorbide mononitrate (IMDUR) 30 MG 24 hr tablet Take 1 tablet (30 mg total) by mouth daily.  Marland Kitchen lisinopril (ZESTRIL) 20 MG tablet Take 1 tablet (20 mg  total) by mouth daily.  . memantine (NAMENDA) 10 MG tablet Take 1 tablet (10 mg total) by mouth 2 (two) times daily.  . nitroGLYCERIN (NITROSTAT) 0.4 MG SL tablet Use 1 tabket under tongue at onset and may repeat every 5 minutes x2  . sertraline (ZOLOFT) 100 MG tablet Take 1 tablet (100 mg total) by mouth daily.   No facility-administered encounter medications on file as of 09/27/2019.    ALLERGIES:  Allergies  Allergen Reactions  . Dust Mite Extract   . Mold Extract [Trichophyton]   . Pollen Extract   . Tetracyclines & Related     Generic only     PHYSICAL EXAM:  ECOG Performance status: 1  Vitals:   09/27/19 1100  BP: (!) 178/64  Pulse: 63  Resp: 16  Temp: (!) 97.3  F (36.3 C)  SpO2: 98%   Filed Weights   09/27/19 1100  Weight: 147 lb 7 oz (66.9 kg)    Physical Exam Constitutional:      Appearance: Normal appearance. He is normal weight.  Cardiovascular:     Rate and Rhythm: Normal rate and regular rhythm.     Heart sounds: Normal heart sounds.  Pulmonary:     Effort: Pulmonary effort is normal.     Breath sounds: Normal breath sounds.  Abdominal:     General: Bowel sounds are normal.     Palpations: Abdomen is soft.  Musculoskeletal:        General: Normal range of motion.  Skin:    General: Skin is warm and dry.  Neurological:     Mental Status: He is alert and oriented to person, place, and time. Mental status is at baseline.  Psychiatric:        Mood and Affect: Mood normal.        Behavior: Behavior normal.        Thought Content: Thought content normal.        Judgment: Judgment normal.      LABORATORY DATA:  I have reviewed the labs as listed.  CBC    Component Value Date/Time   WBC 6.3 09/27/2019 0920   RBC 4.68 09/27/2019 0920   HGB 14.0 09/27/2019 0920   HGB 13.1 09/01/2017 1654   HCT 43.3 09/27/2019 0920   HCT 39.3 09/01/2017 1654   PLT 135 (L) 09/27/2019 0920   PLT 144 (L) 09/01/2017 1654   MCV 92.5 09/27/2019 0920   MCV 89  09/01/2017 1654   MCH 29.9 09/27/2019 0920   MCHC 32.3 09/27/2019 0920   RDW 12.7 09/27/2019 0920   RDW 14.1 09/01/2017 1654   LYMPHSABS 1.8 09/27/2019 0920   LYMPHSABS 2.1 09/01/2017 1654   MONOABS 0.3 09/27/2019 0920   EOSABS 0.2 09/27/2019 0920   EOSABS 0.4 09/01/2017 1654   BASOSABS 0.1 09/27/2019 0920   BASOSABS 0.1 09/01/2017 1654   CMP Latest Ref Rng & Units 09/27/2019 03/09/2019 09/10/2018  Glucose 70 - 99 mg/dL 256(H) 294(H) 124(H)  BUN 8 - 23 mg/dL 33(H) 22 20  Creatinine 0.61 - 1.24 mg/dL 1.29(H) 1.13 1.20  Sodium 135 - 145 mmol/L 137 136 140  Potassium 3.5 - 5.1 mmol/L 4.7 4.4 4.3  Chloride 98 - 111 mmol/L 103 100 99  CO2 22 - 32 mmol/L _0 Calcium 8.9 - 10.3 mg/dL 9.2 9.2 9.5  Total Protein 6.5 - 8.1 g/dL 6.9 6.4(L) 6.7  Total Bilirubin 0.3 - 1.2 mg/dL 1.3(H) 1.5(H) 1.0  Alkaline Phos 38 - 126 U/L 56 61 77  AST 15 - 41 U/L _1 ALT 0 - 44 U/L _2 I personally performed a face-to-face visit.  All questions were answered to patient's stated satisfaction. Encouraged patient to call with any new concerns or questions before his next visit to the cancer center and we can certain see him sooner, if needed.     ASSESSMENT & PLAN:   Thrombocytopenia ([place]) 1.  Chronic mild thrombocytopenia: - Thrombocytopenia suspected to be immune mediated. -Denies any frank bleeding episodes, or abnormal bruising to his trunk, abdomen, or back. -Platelets have been stable. -He is largely asymptomatic and thus we can continue to monitor at this time. -Labs on 09/27/2019 showed his platelet count at 135 - He was given strict instructions to contact us if he has any  abnormal bruising or bleeding episodes. -He will follow-up in 6 months with repeat labs.  2.  Normocytic anemia: - Labs on 09/27/2019 showed his hemoglobin 14.9, ferritin 83, percent saturation 31 - He denies any bright red bleeding per rectum or melena. -We will continue to follow.      Orders placed  this encounter:  Orders Placed This Encounter  Procedures  . Lactate dehydrogenase  . Vitamin B12  . Vitamin D 25 hydroxy  . Folate  . CBC with Differential/Platelet  . Comprehensive metabolic panel  . Ferritin  . Iron and TIBC      Francene Finders, FNP-C [facility] [phone]

## 2019-09-27 NOTE — Assessment & Plan Note (Addendum)
1.  Chronic mild thrombocytopenia: - Thrombocytopenia suspected to be immune mediated. -Denies any frank bleeding episodes, or abnormal bruising to his trunk, abdomen, or back. -Platelets have been stable. -He is largely asymptomatic and thus we can continue to monitor at this time. -Labs on 09/27/2019 showed his platelet count at 135 - He was given strict instructions to contact us if he has any abnormal bruising or bleeding episodes. -He will follow-up in 6 months with repeat labs.  2.  Normocytic anemia: - Labs on 09/27/2019 showed his hemoglobin 14.9, ferritin 83, percent saturation 31 - He denies any bright red bleeding per rectum or melena. -We will continue to follow.

## 2019-09-28 DIAGNOSIS — M5137 Other intervertebral disc degeneration, lumbosacral region: Secondary | ICD-10-CM | POA: Diagnosis not present

## 2019-09-28 DIAGNOSIS — M9905 Segmental and somatic dysfunction of pelvic region: Secondary | ICD-10-CM | POA: Diagnosis not present

## 2019-09-28 DIAGNOSIS — M9903 Segmental and somatic dysfunction of lumbar region: Secondary | ICD-10-CM | POA: Diagnosis not present

## 2019-09-28 DIAGNOSIS — M9904 Segmental and somatic dysfunction of sacral region: Secondary | ICD-10-CM | POA: Diagnosis not present

## 2019-09-28 LAB — VITAMIN D 25 HYDROXY (VIT D DEFICIENCY, FRACTURES): Vit D, 25-Hydroxy: 35.19 ng/mL (ref 30–100)

## 2019-09-30 ENCOUNTER — Ambulatory Visit (INDEPENDENT_AMBULATORY_CARE_PROVIDER_SITE_OTHER): Payer: Medicare PPO | Admitting: *Deleted

## 2019-09-30 ENCOUNTER — Other Ambulatory Visit: Payer: Self-pay

## 2019-09-30 DIAGNOSIS — Z0000 Encounter for general adult medical examination without abnormal findings: Secondary | ICD-10-CM

## 2019-09-30 NOTE — Progress Notes (Addendum)
MEDICARE ANNUAL WELLNESS VISIT  09/30/2019  Telephone Visit Disclaimer This Medicare AWV was conducted by telephone due to national recommendations for restrictions regarding the COVID-19 Pandemic (e.g. social distancing).  I verified, using two identifiers, that I am speaking with Dean Harris or their authorized healthcare agent. I discussed the limitations, risks, security, and privacy concerns of performing an evaluation and management service by telephone and the potential availability of an in-person appointment in the future. The patient expressed understanding and agreed to proceed.   Subjective:  Dean Harris is a 79 y.o. male patient of Chevis Pretty, [place] who had a Medicare Annual Wellness Visit today via telephone.His wife Dean Harris was on the phone as well since he is hard of hearing and doesn't like to wear his hearing aids anymore.  Nilo is Retired and lives with their spouse. he has 1 child who lives in [place] and 1 grand daughter who lives in [place] and visits regularly and helps. he reports that he is socially active and does interact with friends/family regularly. he is minimally physically active and enjoys listening to country music, watching Westerns and Mathis Bud.  Patient Care Team: Chevis Pretty, FNP as PCP - General (Nurse Practitioner)  Advanced Directives 09/30/2019 09/27/2019 03/09/2019 12/16/2017 12/16/2016 01/17/2016 12/13/2015  Does Patient Have a Medical Advance Directive? Yes Yes Yes Yes No No No  Type of Paramedic of [place];Living will Living will;Healthcare Power of Attorney Living will;Healthcare Power of [place];Living will - - -  Does patient want to make changes to medical advance directive? No - Patient declined No - Patient declined No - Patient declined No - Patient declined - - -  Copy of [place] in Chart? No - copy requested No - copy requested No - copy  requested No - copy requested - - -  Would patient like information on creating a medical advance directive? - No - Patient declined - - No - Patient declined No - patient declined information No - patient declined information  Pre-existing out of facility DNR order (yellow form or pink MOST form) - - - - - - -    Hospital Utilization Over the Past 12 Months: # of hospitalizations or ER visits: 0 # of surgeries: 0  Review of Systems    Patient reports that his overall health is worse  due to his dementia   compared to last year.  History obtained from chart review  Patient Reported Readings (BP, Pulse, CBG, Weight, etc) none  Pain Assessment Pain : No/denies pain     Current Medications & Allergies (verified) Allergies as of 09/30/2019       Reactions   Dust Mite Extract    Mold Extract [trichophyton]    Pollen Extract    Tetracyclines & Related    Generic only        Medication List        Accurate as of September 30, 2019  9:49 AM. If you have any questions, ask your nurse or doctor.          Accu-Chek Aviva Plus test strip Generic drug: glucose blood Test blood sugar daily and as needed Dx E11.9   Accu-Chek Aviva Plus w/Device Kit Check blood sugar daily and as needed   Accu-Chek Softclix Lancets lancets CHECK BLOOD SUGAR DAILY AND AS NEEDED Dx E11.9   aspirin EC 81 MG tablet Take 1 tablet (81 mg total) by mouth daily.  b complex vitamins tablet Take 1 tablet by mouth daily.   donepezil 10 MG tablet Commonly known as: ARICEPT TAKE 1/2 (ONE-HALF) TABLET BY MOUTH AT BEDTIME (Needs to be seen before next refill)   Fluticasone-Salmeterol 100-50 MCG/DOSE Aepb Commonly known as: Advair Diskus Inhale 1 puff into the lungs every 12 (twelve) hours.   Ipratropium-Albuterol 20-100 MCG/ACT Aers respimat Commonly known as: COMBIVENT Inhale 1 puff into the lungs every 6 (six) hours.   isosorbide mononitrate 30 MG 24 hr tablet Commonly known as: IMDUR Take  1 tablet (30 mg total) by mouth daily.   lisinopril 20 MG tablet Commonly known as: ZESTRIL Take 1 tablet (20 mg total) by mouth daily.   memantine 10 MG tablet Commonly known as: NAMENDA Take 1 tablet (10 mg total) by mouth 2 (two) times daily.   nitroGLYCERIN 0.4 MG SL tablet Commonly known as: NITROSTAT Use 1 tabket under tongue at onset and may repeat every 5 minutes x2   sertraline 100 MG tablet Commonly known as: ZOLOFT Take 1 tablet (100 mg total) by mouth daily.   Vitamin D 50 MCG (2000 UT) tablet Take 2,000 Units by mouth daily.        History (reviewed): Past Medical History:  Diagnosis Date   Chronic stable angina (HCC)    Coronary artery disease, non-occlusive 01/2012   Diffuse percent LAD, OM1 and mid RCA 50% lesions   Depression    Diabetes mellitus    Essential hypertension    Hyperlipidemia with target LDL less than 70    Moderate calcific aortic stenosis 01/2012   01/2012: Echo - mean/peak gradient12/21 mmHg; 01/2016: Moderate aortic stenosis (mean/peak gradient 24/35 mmHg)    Pneumonia    x2   PTSD (post-traumatic stress disorder) - with depression symptoms    [facility] This has helped his memory issues.   Thrombocytopenia ([place]) 04/29/2014   Vitamin D deficiency    Past Surgical History:  Procedure Laterality Date   CAROTID ENDARTERECTOMY     LEFT HEART CATHETERIZATION WITH CORONARY ANGIOGRAM N/A 02/13/2012   Procedure: LEFT HEART CATHETERIZATION WITH CORONARY ANGIOGRAM;  Surgeon: Dean Furbish, MD;  Location: [facility] CATH LAB;  Service: Cardiovascular: Diffuse mid LAD 50%, 50%pOM1, 50%mRCA -> medical management   TRANSTHORACIC ECHOCARDIOGRAM  01/2012   EF 55-60%. Mild LVH. No RWMA, mild Aortic Stenosis (mean/peak gradient 13 mmHg/21 mmHg)   TRANSTHORACIC ECHOCARDIOGRAM  May 2017, May 2019   A) EF 60-65% w/o RWMA. Normal DF for age. Mod AD (mean/peak gradient 24/35 mmHg); b) EF 60-65%, mod LVH, Mod AS (Mean gradient (S): 28 mm  Hg. Peak 43 mmHg   US CAROTID DOPPLER BILATERAL ([facility] HX) Bilateral 02/05/2016   Stable mild-to-moderate disease with bilateral heterogeneous plaque. RICA - 0-86%, LICA 76-19%. Bilateral subclavian and vertebral arteries normal.   Family History  Problem Relation Age of Onset   Diabetes Mother    Stroke Mother    Hypertension Mother    Hyperlipidemia Mother    Cancer Father    Hypertension Brother    Cancer Other    Social History   Socioeconomic History   Marital status: Married    Spouse name: Dean Harris   Number of children: 1   Years of education: 12   Highest education level: High school graduate  Occupational History   Occupation: Retired     Comment: Custodian  Tobacco Use   Smoking status: Former Smoker    Types: Cigarettes    Quit date: 02/10/1988  Years since quitting: 31.6   Smokeless tobacco: Never Used  Substance and Sexual Activity   Alcohol use: No   Drug use: No   Sexual activity: Not Currently    Comment: Married  Other Topics Concern   Not on file  Social History Narrative   Lives at home w/ wife   Right-handed   Caffeine: 5-6 cups per day   Social Determinants of Health   Financial Resource Strain: Low Risk    Difficulty of Paying Living Expenses: Not hard at all  Food Insecurity: No Food Insecurity   Worried About Charity fundraiser in the Last Year: Never true   Arboriculturist in the Last Year: Never true  Transportation Needs: No Transportation Needs   Lack of Transportation (Medical): No   Lack of Transportation (Non-Medical): No  Physical Activity: Inactive   Days of Exercise per Week: 0 days   Minutes of Exercise per Session: 0 min  Stress: No Stress Concern Present   Feeling of Stress : Not at all  Social Connections: Not Isolated   Frequency of Communication with Friends and Family: More than three times a week   Frequency of Social Gatherings with Friends and Family: More than three times a week   Attends Religious Services: More  than 4 times per year   Active Member of [employer] or Organizations: Yes   Attends Archivist Meetings: More than 4 times per year   Marital Status: Married    Activities of Daily Living In your present state of health, do you have any difficulty performing the following activities: 09/30/2019  Hearing? Y  Comment he is supposed to wear hearing aids but he won't wear them-he gets agitated if his wife tries to have him use them  Vision? N  Comment wears glasses-gets yearly eye exam  Difficulty concentrating or making decisions? Y  Comment his dementia is progressing  Walking or climbing stairs? Y  Comment wife has noticed he is "shuffling his feet" when he walks  Dressing or bathing? Y  Comment he can dress himself but needs help bathing at times  Doing errands, shopping? Y  Comment his wife and grand daughter drive him everywhere and do most of his errands  Conservation officer, nature and eating ? Y  Comment his wife prepares all his meals  Using the Toilet? N  In the past six months, have you accidently leaked urine? N  Do you have problems with loss of bowel control? N  Managing your Medications? Y  Comment his wife manages his medications  Managing your Finances? Y  Comment his wife manages his finances  Housekeeping or managing your Housekeeping? Y  Comment his wife takes care of all the housekeeping  Some recent data might be hidden    Patient Education/ Literacy How often do you need to have someone help you when you read instructions, pamphlets, or other written materials from your doctor or pharmacy?: 5 - Always(he has dementia so he can't read anymore) What is the last grade level you completed in school?: 12th grade  Exercise Current Exercise Habits: The patient does not participate in regular exercise at present, Exercise limited by: neurologic condition(s)  Diet Patient reports consuming 3 meals a day and 2 snack(s) a day Patient reports that his primary diet is:  Regular Patient reports that she does have regular access to food.   Depression Screen PHQ 2/9 Scores 09/30/2019 07/11/2019 09/17/2018 09/10/2018 06/11/2018 03/09/2018 12/01/2017  PHQ - 2  Score 0 2 0 0 0 0 0  PHQ- 9 Score - 2 - - - - -  Exception Documentation - - Patient refusal - - - -     Fall Risk Fall Risk  09/30/2019 07/11/2019 09/17/2018 09/10/2018 06/11/2018  Falls in the past year? 1 0 1 1 No  Number falls in past yr: 0 - 0 0 -  Injury with Fall? 0 - 0 0 -  Risk for fall due to : Impaired mobility;Mental status change - - - -  Risk for fall due to: Comment dementia is getting worse - - - -  Follow up Falls prevention discussed - - - -  Comment Get rid of all throw rugs in the house, adequate lighting in the walkways and grab bars in the bathroom - - - -     Objective:  Dean Harris seemed alert and oriented and he participated appropriately during our telephone visit.  Blood Pressure Weight BMI  BP Readings from Last 3 Encounters:  09/27/19 (!) 178/64  05/20/19 126/68  03/09/19 (!) 146/51   Wt Readings from Last 3 Encounters:  09/27/19 147 lb 7 oz (66.9 kg)  05/20/19 140 lb (63.5 kg)  03/09/19 147 lb 1.6 oz (66.7 kg)   BMI Readings from Last 1 Encounters:  09/27/19 27.86 kg/m    *Unable to obtain current vital signs, weight, and BMI due to telephone visit type  Hearing/Vision  Lyndol did not seem to have difficulty with hearing/understanding during the telephone conversation Reports that he has had a formal eye exam by an eye care professional within the past year Reports that he has not had a formal hearing evaluation within the past year *Unable to fully assess hearing and vision during telephone visit type  Cognitive Function: No flowsheet data found. (Normal:0-7, Significant for Dysfunction: >8)  Normal Cognitive Function Screening: No: pt was unable to do do Cognitive screening due to the Dementia   Immunization & Health Maintenance Record Immunization  History  Administered Date(s) Administered   Fluad Quad(high Dose 65+) 06/23/2019   Influenza Whole 07/20/2008   Influenza, High Dose Seasonal PF 07/13/2017, 06/11/2018   Influenza,inj,Quad PF,6+ Mos 08/24/2013, 07/26/2014, 06/18/2015, 07/14/2016   Pneumococcal Conjugate-13 05/10/2015   Pneumococcal Polysaccharide-23 10/16/2009   Zoster 12/02/2013    Health Maintenance  Topic Date Due   FOOT EXAM  05/26/2018   TETANUS/TDAP  08/22/2018   HEMOGLOBIN A1C  03/12/2019   OPHTHALMOLOGY EXAM  05/15/2019   INFLUENZA VACCINE  Completed   PNA vac Low Risk Adult  Completed       Assessment  This is a routine wellness examination for Dean Harris.  Health Maintenance: Due or Overdue Health Maintenance Due  Topic Date Due   FOOT EXAM  05/26/2018   TETANUS/TDAP  08/22/2018   HEMOGLOBIN A1C  03/12/2019   OPHTHALMOLOGY EXAM  05/15/2019    Dean Harris does not need a referral for Community Assistance: Care Management:   no Social Work:    no Prescription Assistance:  no Nutrition/Diabetes Education:  no   Plan:  Personalized Goals Goals Addressed             This Visit's Progress    DIET - INCREASE WATER INTAKE       Try to drink 6-8 glasses of water daily       Personalized Health Maintenance & Screening Recommendations  Td vaccine  Lung Cancer Screening Recommended: no (Low Dose CT Chest recommended if Age 8-80 years,  30 pack-year currently smoking OR have quit w/in past 15 years) Hepatitis C Screening recommended: no HIV Screening recommended: no  Advanced Directives: Written information was not prepared per patient's request.  Referrals & Orders No orders of the defined types were placed in this encounter.   Follow-up Plan Follow-up with Chevis Pretty, FNP as planned Schedule your Diabetic Eye Exam as discussed Bring a copy of your Advanced Directives in for our records Consider TDAP vaccine at your next visit with your PCP   I have  personally reviewed and noted the following in the patient's chart:   Medical and social history Use of alcohol, tobacco or illicit drugs  Current medications and supplements Functional ability and status Nutritional status Physical activity Advanced directives List of other physicians Hospitalizations, surgeries, and ER visits in previous 12 months Vitals Screenings to include cognitive, depression, and falls Referrals and appointments  In addition, I have reviewed and discussed with Dean Harris certain preventive protocols, quality metrics, and best practice recommendations. A written personalized care plan for preventive services as well as general preventive health recommendations is available and can be mailed to the patient at his request.      Milas Hock, LPN  [id number]   I have reviewed and agree with the above AWV documentation.   Mary-Margaret Hassell Done, FNP

## 2019-09-30 NOTE — Patient Instructions (Signed)

## 2019-10-13 ENCOUNTER — Encounter: Payer: Self-pay | Admitting: Nurse Practitioner

## 2019-10-13 ENCOUNTER — Ambulatory Visit (INDEPENDENT_AMBULATORY_CARE_PROVIDER_SITE_OTHER): Payer: Medicare PPO | Admitting: Nurse Practitioner

## 2019-10-13 DIAGNOSIS — J42 Unspecified chronic bronchitis: Secondary | ICD-10-CM | POA: Diagnosis not present

## 2019-10-13 DIAGNOSIS — E1169 Type 2 diabetes mellitus with other specified complication: Secondary | ICD-10-CM | POA: Diagnosis not present

## 2019-10-13 DIAGNOSIS — M1009 Idiopathic gout, multiple sites: Secondary | ICD-10-CM

## 2019-10-13 DIAGNOSIS — D696 Thrombocytopenia, unspecified: Secondary | ICD-10-CM | POA: Diagnosis not present

## 2019-10-13 DIAGNOSIS — Z6830 Body mass index (BMI) 30.0-30.9, adult: Secondary | ICD-10-CM

## 2019-10-13 DIAGNOSIS — I10 Essential (primary) hypertension: Secondary | ICD-10-CM

## 2019-10-13 DIAGNOSIS — I2510 Atherosclerotic heart disease of native coronary artery without angina pectoris: Secondary | ICD-10-CM

## 2019-10-13 DIAGNOSIS — E1142 Type 2 diabetes mellitus with diabetic polyneuropathy: Secondary | ICD-10-CM | POA: Diagnosis not present

## 2019-10-13 DIAGNOSIS — R413 Other amnesia: Secondary | ICD-10-CM

## 2019-10-13 DIAGNOSIS — G6281 Critical illness polyneuropathy: Secondary | ICD-10-CM

## 2019-10-13 DIAGNOSIS — E785 Hyperlipidemia, unspecified: Secondary | ICD-10-CM

## 2019-10-13 DIAGNOSIS — G4730 Sleep apnea, unspecified: Secondary | ICD-10-CM | POA: Diagnosis not present

## 2019-10-13 DIAGNOSIS — F339 Major depressive disorder, recurrent, unspecified: Secondary | ICD-10-CM

## 2019-10-13 MED ORDER — LISINOPRIL 20 MG PO TABS: 20.0000 mg | ORAL_TABLET | Freq: Every day | ORAL | 1 refills | Status: DC

## 2019-10-13 MED ORDER — ISOSORBIDE MONONITRATE ER 30 MG PO TB24: 30.0000 mg | ORAL_TABLET | Freq: Every day | ORAL | 1 refills | Status: DC

## 2019-10-13 MED ORDER — MEMANTINE HCL 10 MG PO TABS: 10.0000 mg | ORAL_TABLET | Freq: Two times a day (BID) | ORAL | 1 refills | Status: DC

## 2019-10-13 MED ORDER — FLUTICASONE-SALMETEROL 100-50 MCG/DOSE IN AEPB: 1.0000 | INHALATION_SPRAY | Freq: Two times a day (BID) | RESPIRATORY_TRACT | 1 refills | Status: DC

## 2019-10-13 MED ORDER — IPRATROPIUM-ALBUTEROL 20-100 MCG/ACT IN AERS: 1.0000 | INHALATION_SPRAY | Freq: Four times a day (QID) | RESPIRATORY_TRACT | 1 refills | Status: DC

## 2019-10-13 MED ORDER — SERTRALINE HCL 100 MG PO TABS: 100.0000 mg | ORAL_TABLET | Freq: Every day | ORAL | 1 refills | Status: DC

## 2019-10-13 MED ORDER — DONEPEZIL HCL 10 MG PO TABS: ORAL_TABLET | ORAL | 1 refills | Status: DC

## 2019-10-13 NOTE — Progress Notes (Signed)
Virtual Visit via telephone Note Due to COVID-19 pandemic this visit was conducted virtually. This visit type was conducted due to national recommendations for restrictions regarding the COVID-19 Pandemic (e.g. social distancing, sheltering in place) in an effort to limit this patient's exposure and mitigate transmission in our community. All issues noted in this document were discussed and addressed.  A physical exam was not performed with this format.  I connected with Dean Harris on 10/13/19 at 10:10 by telephone and verified that I am speaking with the correct person using two identifiers. Dean Harris is currently located at home and no one is currently with him during visit. The provider, Mary-Margaret Hassell Done, FNP is located in their office at time of visit.  I discussed the limitations, risks, security and privacy concerns of performing an evaluation and management service by telephone and the availability of in person appointments. I also discussed with the patient that there may be a patient responsible charge related to this service. The patient expressed understanding and agreed to proceed.   History and Present Illness:   Chief Complaint: Medical Management of Chronic Issues    HPI:  1. Essential hypertension, benign No c/o chest pain, sob or headache. Does not check bloospressure at home. BP Readings from Last 3 Encounters:  09/27/19 (!) 178/64  05/20/19 126/68  03/09/19 (!) 146/51     2. Coronary artery disease, non-occlusive Had cardiology visit on 06/08/19. According to note they added protein drinks to his diet, but other then that no other changes were made. He is still IMdur daily.  3. Sleep apnea, unspecified type Is sleeping well. Goes  To bed every night at 7:00 -7:30 and sleeps all night.  4. Chronic bronchitis, unspecified chronic bronchitis type [facility]) His wife says he has noit been coughing or anything  5. Hyperlipidemia associated with type 2  diabetes mellitus ([place]) His wife tries to watch what she feeds him..  6. Type 2 diabetes mellitus with diabetic polyneuropathy, without long-term current use of insulin (HCC) Blood sugars have been running around 120 and below  7. Polyneuropathy associated with critical illness (HCC) Has numbness in bil feet/  8. Idiopathic gout of multiple sites, unspecified chronicity Hs had no recent gout flare ups  9. Thrombocytopenia ([place]) Lab Results  Component Value Date   PLT 135 (L) 09/27/2019     10. Depression, recurrent ([place]) He is on sertraline and [facility] wanted [facility] it to 120m because everyday around 4-5 oclock he starts getting angry and fusses about everything. Last a couple of hours, then in evening he his fine.  11. Memory disorder Is on namenda and aricept and wife says she sees no changes.  12. BMI 30.0-30.9,adult Has been losing weight some lately. Wt Readings from Last 3 Encounters:  09/27/19 147 lb 7 oz (66.9 kg)  05/20/19 140 lb (63.5 kg)  03/09/19 147 lb 1.6 oz (66.7 kg)   BMI Readings from Last 3 Encounters:  09/27/19 27.86 kg/m  05/20/19 26.45 kg/m  03/09/19 27.79 kg/m        Outpatient Encounter Medications as of 10/13/2019  Medication Sig  . Accu-Chek Softclix Lancets lancets CHECK BLOOD SUGAR DAILY AND AS NEEDED Dx E11.9  . aspirin EC 81 MG tablet Take 1 tablet (81 mg total) by mouth daily.  .Marland Kitchenb complex vitamins tablet Take 1 tablet by mouth daily.  . Blood Glucose Monitoring Suppl (ACCU-CHEK AVIVA PLUS) w/Device KIT Check blood sugar daily and as needed  .  Cholecalciferol (VITAMIN D) 2000 UNITS tablet Take 2,000 Units by mouth daily.   Marland Kitchen donepezil (ARICEPT) 10 MG tablet TAKE 1/2 (ONE-HALF) TABLET BY MOUTH AT BEDTIME (Needs to be seen before next refill)  . Fluticasone-Salmeterol (ADVAIR DISKUS) 100-50 MCG/DOSE AEPB Inhale 1 puff into the lungs every 12 (twelve) hours.  Marland Kitchen glucose blood (ACCU-CHEK AVIVA PLUS) test strip Test blood sugar daily  and as needed Dx E11.9  . Ipratropium-Albuterol (COMBIVENT) 20-100 MCG/ACT AERS respimat Inhale 1 puff into the lungs every 6 (six) hours.  . isosorbide mononitrate (IMDUR) 30 MG 24 hr tablet Take 1 tablet (30 mg total) by mouth daily.  Marland Kitchen lisinopril (ZESTRIL) 20 MG tablet Take 1 tablet (20 mg total) by mouth daily.  . memantine (NAMENDA) 10 MG tablet Take 1 tablet (10 mg total) by mouth 2 (two) times daily.  . nitroGLYCERIN (NITROSTAT) 0.4 MG SL tablet Use 1 tabket under tongue at onset and may repeat every 5 minutes x2  . sertraline (ZOLOFT) 100 MG tablet Take 1 tablet (100 mg total) by mouth daily.   No facility-administered encounter medications on file as of 10/13/2019.    Past Surgical History:  Procedure Laterality Date  . CAROTID ENDARTERECTOMY    . LEFT HEART CATHETERIZATION WITH CORONARY ANGIOGRAM N/A 02/13/2012   Procedure: LEFT HEART CATHETERIZATION WITH CORONARY ANGIOGRAM;  Surgeon: Dean Furbish, MD;  Location: [facility] CATH LAB;  Service: Cardiovascular: Diffuse mid LAD 50%, 50%pOM1, 50%mRCA -> medical management  . TRANSTHORACIC ECHOCARDIOGRAM  01/2012   EF 55-60%. Mild LVH. No RWMA, mild Aortic Stenosis (mean/peak gradient 13 mmHg/21 mmHg)  . TRANSTHORACIC ECHOCARDIOGRAM  May 2017, May 2019   A) EF 60-65% w/o RWMA. Normal DF for age. Mod AD (mean/peak gradient 24/35 mmHg); b) EF 60-65%, mod LVH, Mod AS (Mean gradient (S): 28 mm Hg. Peak 43 mmHg  . US CAROTID DOPPLER BILATERAL ([facility] HX) Bilateral 02/05/2016   Stable mild-to-moderate disease with bilateral heterogeneous plaque. RICA - 0-35%, LICA 59-74%. Bilateral subclavian and vertebral arteries normal.    Family History  Problem Relation Age of Onset  . Diabetes Mother   . Stroke Mother   . Hypertension Mother   . Hyperlipidemia Mother   . Cancer Father   . Hypertension Brother   . Cancer Other     New complaints: none today  Social history: Lives with his wife who is his caregiver  Controlled substance contract:  n/a    Review of Systems  Constitutional: Negative for diaphoresis and weight loss.  Eyes: Negative for blurred vision, double vision and pain.  Respiratory: Negative for shortness of breath.   Cardiovascular: Negative for chest pain, palpitations, orthopnea and leg swelling.  Gastrointestinal: Negative for abdominal pain.  Skin: Negative for rash.  Neurological: Negative for dizziness, sensory change, loss of consciousness, weakness and headaches.  Endo/Heme/Allergies: Negative for polydipsia. Does not bruise/bleed easily.  Psychiatric/Behavioral: Negative for memory loss. The patient does not have insomnia.   All other systems reviewed and are negative.    Observations/Objective: Alert - answers all questions appropriately No distress    Assessment and Plan: CHEN SAADEH comes in today with chief complaint of Medical Management of Chronic Issues   Diagnosis and orders addressed:  1. Essential hypertension, benign Low sodium diet - lisinopril (ZESTRIL) 20 MG tablet; Take 1 tablet (20 mg total) by mouth daily.  Dispense: 90 tablet; Refill: 1  2. Coronary artery disease, non-occlusive Keep follow up appointment with cardiology - isosorbide mononitrate (IMDUR) 30 MG 24 hr tablet;  Take 1 tablet (30 mg total) by mouth daily.  Dispense: 90 tablet; Refill: 1  3. Sleep apnea, unspecified type Bedtime routine  4. Chronic bronchitis, unspecified chronic bronchitis type ([place]) - Ipratropium-Albuterol (COMBIVENT) 20-100 MCG/ACT AERS respimat; Inhale 1 puff into the lungs every 6 (six) hours.  Dispense: 12 g; Refill: 1 - Fluticasone-Salmeterol (ADVAIR DISKUS) 100-50 MCG/DOSE AEPB; Inhale 1 puff into the lungs every 12 (twelve) hours.  Dispense: 180 each; Refill: 1  5. Hyperlipidemia associated with type 2 diabetes mellitus (HCC) Low fat diet  6. Type 2 diabetes mellitus with diabetic polyneuropathy, without long-term current use of insulin (HCC) Continue with carb counting  7.  Polyneuropathy associated with critical illness ([place]) Check feet daily and make sure wears shoes  8. Idiopathic gout of multiple sites, unspecified chronicity Low purine diet  9. Thrombocytopenia ([place]) Will recheck platlets at next visit  10. Depression, recurrent [facility]) Stress management May add ativan if still has anxiety  - sertraline (ZOLOFT) 100 MG tablet; Take 1 tablet (100 mg total) by mouth daily.  Dispense: 90 tablet; Refill: 1  11. Memory disorder - memantine (NAMENDA) 10 MG tablet; Take 1 tablet (10 mg total) by mouth 2 (two) times daily.  Dispense: 180 tablet; Refill: 1 - donepezil (ARICEPT) 10 MG tablet; TAKE 1/2 (ONE-HALF) TABLET BY MOUTH AT BEDTIME (Needs to be seen before next refill)  Dispense: 45 tablet; Refill: 1  12. BMI 30.0-30.9,adult Discussed diet and exercise for person with BMI >25 Will recheck weight in 3-6 months   Labs pending Health Maintenance reviewed Diet and exercise encouraged  Follow up plan: 3 months     I discussed the assessment and treatment plan with the patient. The patient was provided an opportunity to ask questions and all were answered. The patient agreed with the plan and demonstrated an understanding of the instructions.   The patient was advised to call back or seek an in-person evaluation if the symptoms worsen or if the condition fails to improve as anticipated.  The above assessment and management plan was discussed with the patient. The patient verbalized understanding of and has agreed to the management plan. Patient is aware to call the clinic if symptoms persist or worsen. Patient is aware when to return to the clinic for a follow-up visit. Patient educated on when it is appropriate to go to the emergency department.   Time call ended:  10:30  I provided 20 minutes of non-face-to-face time during this encounter.    Mary-Margaret Hassell Done, FNP

## 2019-11-25 ENCOUNTER — Telehealth: Payer: Self-pay | Admitting: Nurse Practitioner

## 2019-11-25 NOTE — Telephone Encounter (Signed)
FYI for provider

## 2019-11-25 NOTE — Telephone Encounter (Signed)
(  INFO for MMM regarding appt on 12/01/19)  Pt's granddaughter and POA [facility] to see if [facility] could get assistance with [facility] Health and Physical Therapy and was told that she needed to contact pt's PCP and get an order sent to [facility] for Home Health and PT. (Granddaughter is not currently on pt's HIPAA but is in process of sending [place] POA paperwork showing that she is listed)   Scheduled pt to have a televisit with MMM on 12/01/19 regarding this.  [facility] Phone # [phone] ext [phone] [facility] Fax # [phone]  Granddaughter said patient is living with her currently so Home Health will need to come to her address @ [address] [place], [place] [address]

## 2019-12-01 ENCOUNTER — Encounter: Payer: Self-pay | Admitting: Nurse Practitioner

## 2019-12-01 ENCOUNTER — Ambulatory Visit (INDEPENDENT_AMBULATORY_CARE_PROVIDER_SITE_OTHER): Payer: Medicare PPO | Admitting: Nurse Practitioner

## 2019-12-01 DIAGNOSIS — G309 Alzheimer's disease, unspecified: Secondary | ICD-10-CM

## 2019-12-01 DIAGNOSIS — F0281 Dementia in other diseases classified elsewhere with behavioral disturbance: Secondary | ICD-10-CM

## 2019-12-01 NOTE — Progress Notes (Signed)
   Virtual Visit via telephone Note Due to COVID-19 pandemic this visit was conducted virtually. This visit type was conducted due to national recommendations for restrictions regarding the COVID-19 Pandemic (e.g. social distancing, sheltering in place) in an effort to limit this patient's exposure and mitigate transmission in our community. All issues noted in this document were discussed and addressed.  A physical exam was not performed with this format.  I connected with Dean Harris on 12/01/19 at 9:40 by telephone and verified that I am speaking with the correct person using two identifiers. Dean Harris is currently located at his granddaughter and she  is currently with him during visit. The provider, [name], FNP is located in their office at time of visit.  I discussed the limitations, risks, security and privacy concerns of performing an evaluation and management service by telephone and the availability of in person appointments. I also discussed with the patient that there may be a patient responsible charge related to this service. The patient expressed understanding and agreed to proceed.   History and Present Illness:   Chief Complaint: Referral   HPI Patients granddaughter calls in about the patient. His wife had open heart surgery and then fell and hit her head causing a brain bled and is on life support. So granddaughter is taking care of Dean Harris right now. Penn has had worsening dementia over the last several years.He is a patient of our office as well as the [facility]> they are asking for a request of in home caregiver. Request needs  to be faxed- [phone]. Attention Aggie Moats and Dr. Carmie Kanner.    Review of Systems  Constitutional: Negative for diaphoresis and weight loss.  Eyes: Negative for blurred vision, double vision and pain.  Respiratory: Negative for shortness of breath.   Cardiovascular: Negative for chest pain, palpitations, orthopnea and leg  swelling.  Gastrointestinal: Negative for abdominal pain.  Skin: Negative for rash.  Neurological: Positive for weakness. Negative for dizziness, sensory change, loss of consciousness and headaches.  Endo/Heme/Allergies: Negative for polydipsia. Does not bruise/bleed easily.  Psychiatric/Behavioral: Positive for hallucinations and memory loss. The patient does not have insomnia.   All other systems reviewed and are negative.    Observations/Objective: Unable to speak with patient due to his dementia  Assessment and Plan: Dean Harris in today with chief complaint of Referral   1. Alzheimer's dementia with behavioral disturbance, unspecified timing of dementia onset (HCC) Will write order for home care [facility]    Follow Up Instructions: prn    I discussed the assessment and treatment plan with the patient. The patient was provided an opportunity to ask questions and all were answered. The patient agreed with the plan and demonstrated an understanding of the instructions.   The patient was advised to call back or seek an in-person evaluation if the symptoms worsen or if the condition fails to improve as anticipated.  The above assessment and management plan was discussed with the patient. The patient verbalized understanding of and has agreed to the management plan. Patient is aware to call the clinic if symptoms persist or worsen. Patient is aware when to return to the clinic for a follow-up visit. Patient educated on when it is appropriate to go to the emergency department.   Time call ended:  9:55  I provided 15 minutes of non-face-to-face time during this encounter.    [name], FNP

## 2019-12-01 NOTE — Addendum Note (Signed)
Addended by: Cleda Daub on: 12/01/2019 10:35 AM   Modules accepted: Orders

## 2019-12-07 ENCOUNTER — Ambulatory Visit: Payer: Medicare PPO | Admitting: Podiatry

## 2019-12-07 ENCOUNTER — Other Ambulatory Visit: Payer: Self-pay

## 2019-12-07 VITALS — Temp 97.1°F

## 2019-12-07 DIAGNOSIS — M79674 Pain in right toe(s): Secondary | ICD-10-CM | POA: Diagnosis not present

## 2019-12-07 DIAGNOSIS — B351 Tinea unguium: Secondary | ICD-10-CM

## 2019-12-07 DIAGNOSIS — M2011 Hallux valgus (acquired), right foot: Secondary | ICD-10-CM

## 2019-12-07 DIAGNOSIS — M79675 Pain in left toe(s): Secondary | ICD-10-CM

## 2019-12-07 DIAGNOSIS — M2012 Hallux valgus (acquired), left foot: Secondary | ICD-10-CM | POA: Diagnosis not present

## 2019-12-07 NOTE — Patient Instructions (Signed)
Bunion  A bunion is a bump on the base of the big toe that forms when the bones of the big toe joint move out of position. Bunions may be small at first, but they often get larger over time. They can make walking painful. What are the causes? A bunion may be caused by:  Wearing narrow or pointed shoes that force the big toe to press against the other toes.  Abnormal foot development that causes the foot to roll inward (pronate).  Changes in the foot that are caused by certain diseases, such as rheumatoid arthritis or polio.  A foot injury. What increases the risk? The following factors may make you more likely to develop this condition:  Wearing shoes that squeeze the toes together.  Having certain diseases, such as: ? Rheumatoid arthritis. ? Polio. ? Cerebral palsy.  Having family members who have bunions.  Being born with a foot deformity, such as flat feet or low arches.  Doing activities that put a lot of pressure on the feet, such as ballet dancing. What are the signs or symptoms? The main symptom of a bunion is a noticeable bump on the big toe. Other symptoms may include:  Pain.  Swelling around the big toe.  Redness and inflammation.  Thick or hardened skin on the big toe or between the toes.  Stiffness or loss of motion in the big toe.  Trouble with walking. How is this diagnosed? A bunion may be diagnosed based on your symptoms, medical history, and activities. You may have tests, such as:  X-rays. These allow your health care provider to check the position of the bones in your foot and look for damage to your joint. They also help your health care provider determine the severity of your bunion and the best way to treat it.  Joint aspiration. In this test, a sample of fluid is removed from the toe joint. This test may be done if you are in a lot of pain. It helps rule out diseases that cause painful swelling of the joints, such as arthritis. How is this  treated? Treatment depends on the severity of your symptoms. The goal of treatment is to relieve symptoms and prevent the bunion from getting worse. Your health care provider may recommend:  Wearing shoes that have a wide toe box.  Using bunion pads to cushion the affected area.  Taping your toes together to keep them in a normal position.  Placing a device inside your shoe (orthotics) to help reduce pressure on your toe joint.  Taking medicine to ease pain, inflammation, and swelling.  Applying heat or ice to the affected area.  Doing stretching exercises.  Surgery to remove scar tissue and move the toes back into their normal position. This treatment is rare. Follow these instructions at home: Managing pain, stiffness, and swelling   If directed, put ice on the painful area: ? Put ice in a plastic bag. ? Place a towel between your skin and the bag. ? Leave the ice on for 20 minutes, 2-3 times a day. Activity   If directed, apply heat to the affected area before you exercise. Use the heat source that your health care provider recommends, such as a moist heat pack or a heating pad. ? Place a towel between your skin and the heat source. ? Leave the heat on for 20-30 minutes. ? Remove the heat if your skin turns bright red. This is especially important if you are unable to feel pain,   heat, or cold. You may have a greater risk of getting burned.  Do exercises as told by your health care provider. General instructions  Support your toe joint with proper footwear, shoe padding, or taping as told by your health care provider.  Take over-the-counter and prescription medicines only as told by your health care provider.  Keep all follow-up visits as told by your health care provider. This is important. Contact a health care provider if your symptoms:  Get worse.  Do not improve in 2 weeks. Get help right away if you have:  Severe pain and trouble with walking. Summary  A  bunion is a bump on the base of the big toe that forms when the bones of the big toe joint move out of position.  Bunions can make walking painful.  Treatment depends on the severity of your symptoms.  Support your toe joint with proper footwear, shoe padding, or taping as told by your health care provider. This information is not intended to replace advice given to you by your health care provider. Make sure you discuss any questions you have with your health care provider. Document Revised: 03/08/2018 Document Reviewed: 01/12/2018 Elsevier Patient Education  2020 Elsevier Inc.  

## 2019-12-12 ENCOUNTER — Encounter: Payer: Self-pay | Admitting: Podiatry

## 2019-12-12 NOTE — Progress Notes (Signed)
Subjective: Dean Harris presents today referred by Chevis Pretty, FNP for complaint thick, elongated toenails b/l feet.  He also has h/o dementia. His granddaughter is present during the visit  Patient has history of peripheral neuropathy and borderline diabetes.  Denies any h/o foot wounds. He has numbness in his feet. Also has h/o gout.   Past Medical History:  Diagnosis Date  . Chronic stable angina ([place])   . 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 Depression   . Diabetes mellitus   . Essential hypertension   . Hyperlipidemia with target LDL less than 70   . 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 Pneumonia    x2  . 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 This has helped his memory issues.  . Thrombocytopenia ([place]) 04/29/2014  . Vitamin D deficiency      Patient Active Problem List   Diagnosis Date Noted  . Weight loss, non-intentional 05/20/2019  . Depression, recurrent ([place]) 12/01/2017  . Memory disorder 12/01/2017  . Carotid artery plaque, bilateral; s/p Bilateral CEA 07/24/2016  . Moderate aortic stenosis 01/28/2016  . Chronic stable angina ([place]) 01/28/2016  . BMI 30.0-30.9,adult 07/09/2015  . Thrombocytopenia ([place]) 04/29/2014  . Essential hypertension, benign 03/03/2014  . Coronary artery disease, non-occlusive   . Sleep apnea 11/30/2010  . Gout 11/30/2010  . Hyperlipidemia associated with type 2 diabetes mellitus ([place]) 11/30/2010  . Rosacea 11/30/2010  . COPD (chronic obstructive pulmonary disease) ([place]) 11/30/2010  . ED (erectile dysfunction) 11/30/2010  . Peripheral neuropathy 11/30/2010  . Diabetes ([place]) 09/16/1999     Past Surgical History:  Procedure Laterality Date  . CAROTID ENDARTERECTOMY    .  LEFT HEART CATHETERIZATION WITH CORONARY ANGIOGRAM N/A 02/13/2012   Procedure: LEFT HEART CATHETERIZATION WITH CORONARY ANGIOGRAM;  Surgeon: Candee Furbish, MD;  Location: [facility] CATH LAB;  Service: Cardiovascular: Diffuse mid LAD 50%, 50%pOM1, 50%mRCA -> medical management  . TRANSTHORACIC ECHOCARDIOGRAM  01/2012   EF 55-60%. Mild LVH. No RWMA, mild Aortic Stenosis (mean/peak gradient 13 mmHg/21 mmHg)  . TRANSTHORACIC ECHOCARDIOGRAM  May 2017, May 2019   A) EF 60-65% w/o RWMA. Normal DF for age. Mod AD (mean/peak gradient 24/35 mmHg); b) EF 60-65%, mod LVH, Mod AS (Mean gradient (S): 28 mm Hg. Peak 43 mmHg  . US CAROTID DOPPLER BILATERAL ([facility] HX) Bilateral 02/05/2016   Stable mild-to-moderate disease with bilateral heterogeneous plaque. RICA - 1-54%, LICA 00-86%. Bilateral subclavian and vertebral arteries normal.     Current Outpatient Medications on File Prior to Visit  Medication Sig Dispense Refill  . Accu-Chek Softclix Lancets lancets CHECK BLOOD SUGAR DAILY AND AS NEEDED Dx E11.9 100 each 3  . aspirin EC 81 MG tablet Take 1 tablet (81 mg total) by mouth daily. 90 tablet 3  . b complex vitamins tablet Take 1 tablet by mouth daily.    . Blood Glucose Monitoring Suppl (ACCU-CHEK AVIVA PLUS) w/Device KIT Check blood sugar daily and as needed 1 kit 0  . donepezil (ARICEPT) 10 MG tablet TAKE 1/2 (ONE-HALF) TABLET BY MOUTH AT BEDTIME (Needs to be seen before next refill) 45 tablet 1  . Fluticasone-Salmeterol (ADVAIR DISKUS) 100-50 MCG/DOSE AEPB Inhale 1 puff into the lungs every 12 (twelve) hours. 180 each 1  . glucose blood (ACCU-CHEK  AVIVA PLUS) test strip Test blood sugar daily and as needed Dx E11.9 100 each 3  . Ipratropium-Albuterol (COMBIVENT) 20-100 MCG/ACT AERS respimat Inhale 1 puff into the lungs every 6 (six) hours. 12 g 1  . isosorbide mononitrate (IMDUR) 30 MG 24 hr tablet Take 1 tablet (30 mg total) by mouth daily. 90 tablet 1  . lisinopril (ZESTRIL) 20 MG tablet Take 1 tablet (20 mg  total) by mouth daily. 90 tablet 1  . memantine (NAMENDA) 10 MG tablet Take 1 tablet (10 mg total) by mouth 2 (two) times daily. 180 tablet 1  . nitroGLYCERIN (NITROSTAT) 0.4 MG SL tablet Use 1 tabket under tongue at onset and may repeat every 5 minutes x2 25 tablet 5  . sertraline (ZOLOFT) 100 MG tablet Take 1 tablet (100 mg total) by mouth daily. 90 tablet 1   No current facility-administered medications on file prior to visit.     Allergies  Allergen Reactions  . Dust Mite Extract   . Mold Extract [Trichophyton]   . Pollen Extract   . Tetracyclines & Related     Generic only     Social History   Occupational History  . Occupation: Retired     Comment: Custodian  Tobacco Use  . Smoking status: Former Smoker    Types: Cigarettes    Quit date: 02/10/1988    Years since quitting: 31.8  . Smokeless tobacco: Never Used  Substance and Sexual Activity  . Alcohol use: No  . Drug use: No  . Sexual activity: Not Currently    Comment: Married     Family History  Problem Relation Age of Onset  . Diabetes Mother   . Stroke Mother   . Hypertension Mother   . Hyperlipidemia Mother   . Cancer Father   . Hypertension Brother   . Cancer Other      Immunization History  Administered Date(s) Administered  . Fluad Quad(high Dose 65+) 06/23/2019  . Influenza Whole 07/20/2008  . Influenza, High Dose Seasonal PF 07/13/2017, 06/11/2018  . Influenza,inj,Quad PF,6+ Mos 08/24/2013, 07/26/2014, 06/18/2015, 07/14/2016  . Pneumococcal Conjugate-13 05/10/2015  . Pneumococcal Polysaccharide-23 10/16/2009  . Zoster 12/02/2013     Objective: Vitals:   12/07/19 1417  Temp: (!) 97.1 F (36.2 C)    Pt is 78 y.o. Caucasian male, pleasantly confused,  WD, WN in NAD.  AAO x 3.   Vascular Examination:  Capillary refill time to digits immediate b/l. Faintly palpable DP pulses b/l. Faintly palpable PT pulses b/l. Pedal hair absent b/l Skin temperature gradient within normal limits  b/l.  Dermatological Examination: Pedal skin with normal turgor, texture and tone bilaterally. No open wounds bilaterally. No interdigital macerations bilaterally. Toenails 1-5 b/l elongated, dystrophic, thickened, crumbly with subungual debris and tenderness to dorsal palpation.  Musculoskeletal: Normal muscle strength 5/5 to all lower extremity muscle groups bilaterally, no pain crepitus or joint limitation noted with ROM b/l and bunion deformity noted b/l  Neurological: Protective sensation intact 5/5 intact bilaterally with 10g monofilament b/l Unable to assess due to paitent's cognition, but he does respond to external noxious stimuli  Assessment: 1. Pain due to onychomycosis of toenails of both feet   2. Hallux valgus, acquired, bilateral     Plan: -Discussed topical, laser and oral medication. Granddaughter would like to defer to next visit after discussing with family. -Toenails 1-5 b/l were debrided in length and girth with sterile nail nippers and dremel without iatrogenic bleeding.  -Patient to continue soft, supportive shoe   gear daily. -Patient to report any pedal injuries to medical professional immediately. -Patient/POA to call should there be question/concern in the interim.  Return in about 3 months (around 03/08/2020) for nail trim. 

## 2019-12-29 ENCOUNTER — Telehealth: Payer: Self-pay | Admitting: Nurse Practitioner

## 2019-12-30 NOTE — Telephone Encounter (Signed)
Ok for note 

## 2019-12-30 NOTE — Telephone Encounter (Signed)
Mandy getting MMM to sign and faxing note.  Left detailed message.

## 2020-01-10 ENCOUNTER — Emergency Department (HOSPITAL_COMMUNITY): Payer: Medicare PPO

## 2020-01-10 ENCOUNTER — Telehealth: Payer: Self-pay | Admitting: Nurse Practitioner

## 2020-01-10 ENCOUNTER — Other Ambulatory Visit: Payer: Self-pay

## 2020-01-10 ENCOUNTER — Inpatient Hospital Stay (HOSPITAL_COMMUNITY)
Admission: EM | Admit: 2020-01-10 | Discharge: 2020-01-14 | DRG: 193 | Disposition: A | Discharge status: Home / Self Care | Payer: Medicare PPO | Attending: Internal Medicine | Admitting: Internal Medicine

## 2020-01-10 ENCOUNTER — Encounter (HOSPITAL_COMMUNITY): Payer: Self-pay | Admitting: Emergency Medicine

## 2020-01-10 DIAGNOSIS — Z79899 Other long term (current) drug therapy: Secondary | ICD-10-CM

## 2020-01-10 DIAGNOSIS — N179 Acute kidney failure, unspecified: Secondary | ICD-10-CM | POA: Diagnosis present

## 2020-01-10 DIAGNOSIS — D696 Thrombocytopenia, unspecified: Secondary | ICD-10-CM | POA: Diagnosis present

## 2020-01-10 DIAGNOSIS — F329 Major depressive disorder, single episode, unspecified: Secondary | ICD-10-CM | POA: Diagnosis present

## 2020-01-10 DIAGNOSIS — Z87891 Personal history of nicotine dependence: Secondary | ICD-10-CM

## 2020-01-10 DIAGNOSIS — N182 Chronic kidney disease, stage 2 (mild): Secondary | ICD-10-CM | POA: Diagnosis present

## 2020-01-10 DIAGNOSIS — I25118 Atherosclerotic heart disease of native coronary artery with other forms of angina pectoris: Secondary | ICD-10-CM | POA: Diagnosis not present

## 2020-01-10 DIAGNOSIS — I10 Essential (primary) hypertension: Secondary | ICD-10-CM | POA: Diagnosis not present

## 2020-01-10 DIAGNOSIS — K5900 Constipation, unspecified: Secondary | ICD-10-CM | POA: Diagnosis not present

## 2020-01-10 DIAGNOSIS — I350 Nonrheumatic aortic (valve) stenosis: Secondary | ICD-10-CM | POA: Diagnosis present

## 2020-01-10 DIAGNOSIS — J440 Chronic obstructive pulmonary disease with acute lower respiratory infection: Secondary | ICD-10-CM | POA: Diagnosis present

## 2020-01-10 DIAGNOSIS — Z888 Allergy status to other drugs, medicaments and biological substances status: Secondary | ICD-10-CM | POA: Diagnosis not present

## 2020-01-10 DIAGNOSIS — R109 Unspecified abdominal pain: Secondary | ICD-10-CM

## 2020-01-10 DIAGNOSIS — F0390 Unspecified dementia without behavioral disturbance: Secondary | ICD-10-CM | POA: Diagnosis present

## 2020-01-10 DIAGNOSIS — G9389 Other specified disorders of brain: Secondary | ICD-10-CM | POA: Diagnosis present

## 2020-01-10 DIAGNOSIS — E1122 Type 2 diabetes mellitus with diabetic chronic kidney disease: Secondary | ICD-10-CM | POA: Diagnosis present

## 2020-01-10 DIAGNOSIS — I129 Hypertensive chronic kidney disease with stage 1 through stage 4 chronic kidney disease, or unspecified chronic kidney disease: Secondary | ICD-10-CM | POA: Diagnosis present

## 2020-01-10 DIAGNOSIS — Q638 Other specified congenital malformations of kidney: Secondary | ICD-10-CM | POA: Diagnosis not present

## 2020-01-10 DIAGNOSIS — R2681 Unsteadiness on feet: Secondary | ICD-10-CM | POA: Diagnosis not present

## 2020-01-10 DIAGNOSIS — R410 Disorientation, unspecified: Secondary | ICD-10-CM | POA: Diagnosis not present

## 2020-01-10 DIAGNOSIS — Z20822 Contact with and (suspected) exposure to covid-19: Secondary | ICD-10-CM | POA: Diagnosis present

## 2020-01-10 DIAGNOSIS — Z83438 Family history of other disorder of lipoprotein metabolism and other lipidemia: Secondary | ICD-10-CM | POA: Diagnosis not present

## 2020-01-10 DIAGNOSIS — Z833 Family history of diabetes mellitus: Secondary | ICD-10-CM

## 2020-01-10 DIAGNOSIS — E559 Vitamin D deficiency, unspecified: Secondary | ICD-10-CM | POA: Diagnosis present

## 2020-01-10 DIAGNOSIS — Z809 Family history of malignant neoplasm, unspecified: Secondary | ICD-10-CM

## 2020-01-10 DIAGNOSIS — E785 Hyperlipidemia, unspecified: Secondary | ICD-10-CM | POA: Diagnosis present

## 2020-01-10 DIAGNOSIS — R4182 Altered mental status, unspecified: Secondary | ICD-10-CM | POA: Diagnosis present

## 2020-01-10 DIAGNOSIS — Z823 Family history of stroke: Secondary | ICD-10-CM

## 2020-01-10 DIAGNOSIS — R471 Dysarthria and anarthria: Secondary | ICD-10-CM | POA: Diagnosis not present

## 2020-01-10 DIAGNOSIS — R17 Unspecified jaundice: Secondary | ICD-10-CM | POA: Diagnosis present

## 2020-01-10 DIAGNOSIS — K861 Other chronic pancreatitis: Secondary | ICD-10-CM | POA: Diagnosis present

## 2020-01-10 DIAGNOSIS — J42 Unspecified chronic bronchitis: Secondary | ICD-10-CM | POA: Diagnosis not present

## 2020-01-10 DIAGNOSIS — Z8249 Family history of ischemic heart disease and other diseases of the circulatory system: Secondary | ICD-10-CM

## 2020-01-10 DIAGNOSIS — J449 Chronic obstructive pulmonary disease, unspecified: Secondary | ICD-10-CM | POA: Diagnosis present

## 2020-01-10 DIAGNOSIS — T43595A Adverse effect of other antipsychotics and neuroleptics, initial encounter: Secondary | ICD-10-CM | POA: Diagnosis not present

## 2020-01-10 DIAGNOSIS — H02409 Unspecified ptosis of unspecified eyelid: Secondary | ICD-10-CM | POA: Diagnosis not present

## 2020-01-10 DIAGNOSIS — G9341 Metabolic encephalopathy: Secondary | ICD-10-CM | POA: Diagnosis present

## 2020-01-10 DIAGNOSIS — Z8679 Personal history of other diseases of the circulatory system: Secondary | ICD-10-CM | POA: Diagnosis not present

## 2020-01-10 DIAGNOSIS — Z7951 Long term (current) use of inhaled steroids: Secondary | ICD-10-CM

## 2020-01-10 DIAGNOSIS — J189 Pneumonia, unspecified organism: Principal | ICD-10-CM | POA: Diagnosis present

## 2020-01-10 DIAGNOSIS — R262 Difficulty in walking, not elsewhere classified: Secondary | ICD-10-CM | POA: Diagnosis not present

## 2020-01-10 DIAGNOSIS — F4310 Post-traumatic stress disorder, unspecified: Secondary | ICD-10-CM | POA: Diagnosis present

## 2020-01-10 DIAGNOSIS — R778 Other specified abnormalities of plasma proteins: Secondary | ICD-10-CM | POA: Diagnosis present

## 2020-01-10 DIAGNOSIS — D631 Anemia in chronic kidney disease: Secondary | ICD-10-CM | POA: Diagnosis present

## 2020-01-10 DIAGNOSIS — Z7982 Long term (current) use of aspirin: Secondary | ICD-10-CM

## 2020-01-10 DIAGNOSIS — R1084 Generalized abdominal pain: Secondary | ICD-10-CM | POA: Diagnosis not present

## 2020-01-10 HISTORY — DX: Other amnesia: R41.3

## 2020-01-10 LAB — URINALYSIS, ROUTINE W REFLEX MICROSCOPIC
Bilirubin Urine: NEGATIVE
Glucose, UA: NEGATIVE mg/dL
Hgb urine dipstick: NEGATIVE
Ketones, ur: NEGATIVE mg/dL
Leukocytes,Ua: NEGATIVE
Nitrite: NEGATIVE
Protein, ur: NEGATIVE mg/dL
Specific Gravity, Urine: 1.024 (ref 1.005–1.030)
pH: 5 (ref 5.0–8.0)

## 2020-01-10 LAB — RESPIRATORY PANEL BY RT PCR (FLU A&B, COVID)
Influenza A by PCR: NEGATIVE
Influenza B by PCR: NEGATIVE
SARS Coronavirus 2 by RT PCR: NEGATIVE

## 2020-01-10 LAB — COMPREHENSIVE METABOLIC PANEL
ALT: 19 U/L (ref 0–44)
AST: 17 U/L (ref 15–41)
Albumin: 3.8 g/dL (ref 3.5–5.0)
Alkaline Phosphatase: 45 U/L (ref 38–126)
Anion gap: 10 (ref 5–15)
BUN: 40 mg/dL — ABNORMAL HIGH (ref 8–23)
CO2: 24 mmol/L (ref 22–32)
Calcium: 8.9 mg/dL (ref 8.9–10.3)
Chloride: 100 mmol/L (ref 98–111)
Creatinine, Ser: 1.9 mg/dL — ABNORMAL HIGH (ref 0.61–1.24)
GFR calc Af Amer: 39 mL/min — ABNORMAL LOW (ref >60)
GFR calc non Af Amer: 33 mL/min — ABNORMAL LOW (ref >60)
Glucose, Bld: 177 mg/dL — ABNORMAL HIGH (ref 70–99)
Potassium: 4.8 mmol/L (ref 3.5–5.1)
Sodium: 134 mmol/L — ABNORMAL LOW (ref 135–145)
Total Bilirubin: 2.2 mg/dL — ABNORMAL HIGH (ref 0.3–1.2)
Total Protein: 6.6 g/dL (ref 6.5–8.1)

## 2020-01-10 LAB — DIFFERENTIAL
Abs Immature Granulocytes: 0.08 10*3/uL — ABNORMAL HIGH (ref 0.00–0.07)
Basophils Absolute: 0.1 10*3/uL (ref 0.0–0.1)
Basophils Relative: 0 %
Eosinophils Absolute: 0 10*3/uL (ref 0.0–0.5)
Eosinophils Relative: 0 %
Immature Granulocytes: 1 %
Lymphocytes Relative: 13 %
Lymphs Abs: 2 10*3/uL (ref 0.7–4.0)
Monocytes Absolute: 0.7 10*3/uL (ref 0.1–1.0)
Monocytes Relative: 5 %
Neutro Abs: 12.7 10*3/uL — ABNORMAL HIGH (ref 1.7–7.7)
Neutrophils Relative %: 81 %

## 2020-01-10 LAB — PROTIME-INR
INR: 1.3 — ABNORMAL HIGH (ref 0.8–1.2)
Prothrombin Time: 15.6 seconds — ABNORMAL HIGH (ref 11.4–15.2)

## 2020-01-10 LAB — CBC
HCT: 37.7 % — ABNORMAL LOW (ref 39.0–52.0)
Hemoglobin: 12.5 g/dL — ABNORMAL LOW (ref 13.0–17.0)
MCH: 29.7 pg (ref 26.0–34.0)
MCHC: 33.2 g/dL (ref 30.0–36.0)
MCV: 89.5 fL (ref 80.0–100.0)
Platelets: 129 10*3/uL — ABNORMAL LOW (ref 150–400)
RBC: 4.21 MIL/uL — ABNORMAL LOW (ref 4.22–5.81)
RDW: 12.8 % (ref 11.5–15.5)
WBC: 15.5 10*3/uL — ABNORMAL HIGH (ref 4.0–10.5)
nRBC: 0 % (ref 0.0–0.2)

## 2020-01-10 LAB — I-STAT CHEM 8, ED
BUN: 38 mg/dL — ABNORMAL HIGH (ref 8–23)
Calcium, Ion: 1.13 mmol/L — ABNORMAL LOW (ref 1.15–1.40)
Chloride: 98 mmol/L (ref 98–111)
Creatinine, Ser: 1.8 mg/dL — ABNORMAL HIGH (ref 0.61–1.24)
Glucose, Bld: 171 mg/dL — ABNORMAL HIGH (ref 70–99)
HCT: 38 % — ABNORMAL LOW (ref 39.0–52.0)
Hemoglobin: 12.9 g/dL — ABNORMAL LOW (ref 13.0–17.0)
Potassium: 4.7 mmol/L (ref 3.5–5.1)
Sodium: 134 mmol/L — ABNORMAL LOW (ref 135–145)
TCO2: 26 mmol/L (ref 22–32)

## 2020-01-10 LAB — APTT: aPTT: 37 seconds — ABNORMAL HIGH (ref 24–36)

## 2020-01-10 MED ORDER — ONDANSETRON HCL 4 MG PO TABS: 4.0000 mg | ORAL_TABLET | Freq: Four times a day (QID) | ORAL | Status: DC | PRN

## 2020-01-10 MED ORDER — ENOXAPARIN SODIUM 30 MG/0.3ML ~~LOC~~ SOLN
30.0000 mg | Freq: Every day | SUBCUTANEOUS | Status: DC
  Administered 2020-01-11 – 2020-01-12 (×2): 30 mg via SUBCUTANEOUS
  Filled 2020-01-10 (×2): qty 0.3

## 2020-01-10 MED ORDER — SODIUM CHLORIDE 0.9 % IV SOLN
2.0000 g | INTRAVENOUS | Status: DC
  Administered 2020-01-10 – 2020-01-13 (×4): 2 g via INTRAVENOUS
  Filled 2020-01-10 (×2): qty 20
  Filled 2020-01-10: qty 2
  Filled 2020-01-10: qty 20
  Filled 2020-01-10: qty 2

## 2020-01-10 MED ORDER — IOHEXOL 300 MG/ML  SOLN
100.0000 mL | Freq: Once | INTRAMUSCULAR | Status: AC | PRN
  Administered 2020-01-10: 100 mL via INTRAVENOUS

## 2020-01-10 MED ORDER — SODIUM CHLORIDE 0.9% FLUSH
3.0000 mL | Freq: Once | INTRAVENOUS | Status: AC
  Administered 2020-01-10: 3 mL via INTRAVENOUS

## 2020-01-10 MED ORDER — ONDANSETRON HCL 4 MG/2ML IJ SOLN: 4.0000 mg | Freq: Four times a day (QID) | INTRAMUSCULAR | Status: DC | PRN

## 2020-01-10 MED ORDER — DEXTROSE-NACL 5-0.9 % IV SOLN
INTRAVENOUS | Status: DC
Start: 2020-01-11 — End: 2020-01-10

## 2020-01-10 MED ORDER — LATANOPROST 0.005 % OP SOLN
1.0000 [drp] | Freq: Every day | OPHTHALMIC | Status: DC
  Filled 2020-01-10: qty 2.5

## 2020-01-10 MED ORDER — SODIUM CHLORIDE 0.9 % IV SOLN
500.0000 mg | INTRAVENOUS | Status: DC
  Administered 2020-01-10 – 2020-01-11 (×2): 500 mg via INTRAVENOUS
  Filled 2020-01-10 (×4): qty 500

## 2020-01-10 MED ORDER — HYDRALAZINE HCL 20 MG/ML IJ SOLN: 10.0000 mg | INTRAMUSCULAR | Status: DC | PRN

## 2020-01-10 MED ORDER — SODIUM CHLORIDE 0.9 % IV BOLUS
1000.0000 mL | Freq: Once | INTRAVENOUS | Status: AC
  Administered 2020-01-10: 1000 mL via INTRAVENOUS

## 2020-01-10 MED ORDER — LACTATED RINGERS IV SOLN: INTRAVENOUS | Status: AC

## 2020-01-10 NOTE — H&P (Signed)
History and Physical    Dean Harris KGM:[id number] DOB: 16-Dec-1940 DOA: 01/10/2020  PCP: Chevis Pretty, FNP   Patient coming from: Home.  History obtained from patient's granddaughter.  Patient has dementia.  Chief Complaint: Epigastric pain.  Confusion.  HPI: Dean Harris is a 79 y.o. male with history of dementia, nonobstructive CAD, bilateral carotid endarterectomy, hyperlipidemia, moderate aortic stenosis chronic stable angina has been found to be increasingly confused and difficult to ambulate over the last 48 hours at home.  Today patient also complained of some epigastric pain and had some difficulty with moving bowels and patient had to strain.  After which patient became more weak and was brought to the ER.  Did not have any chest pain or shortness of breath or any productive cough.  ED Course: In the ER labs show creatinine of 1.9 WBC 15.3 hemoglobin 12.5 platelets 129 blood glucose 177 sodium 134 total bilirubin was 2.2 with AST and ALT being normal.  CT head is unremarkable EKG shows normal sinus rhythm.  CT abdomen pelvis was done which showed bilateral infiltrates concerning for pneumonia but nothing acute in the abdomen did show any acute but showed features of chronic pancreatitis.  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  Patient was started on empiric antibiotic and admitted for further management.  At the time of my exam patient abdomen appears benign.  Denies any chest pain.  Covid test was negative.  Review of Systems: As per HPI, rest all negative.   Past Medical History:  Diagnosis Date   Chronic stable angina (HCC)    Coronary artery disease, non-occlusive 01/2012   Diffuse percent LAD, OM1 and mid RCA 50% lesions   Depression    Diabetes mellitus    Essential hypertension    Hyperlipidemia with target LDL less than 70    Moderate calcific aortic stenosis 01/2012   01/2012: Echo - mean/peak gradient12/21 mmHg; 01/2016: Moderate  aortic stenosis (mean/peak gradient 24/35 mmHg)    Pneumonia    x2   PTSD (post-traumatic stress disorder) - with depression symptoms    [facility] This has helped his memory issues.   Thrombocytopenia ([place]) 04/29/2014   Vitamin D deficiency     Past Surgical History:  Procedure Laterality Date   CAROTID ENDARTERECTOMY     LEFT HEART CATHETERIZATION WITH CORONARY ANGIOGRAM N/A 02/13/2012   Procedure: LEFT HEART CATHETERIZATION WITH CORONARY ANGIOGRAM;  Surgeon: Candee Furbish, MD;  Location: [facility] CATH LAB;  Service: Cardiovascular: Diffuse mid LAD 50%, 50%pOM1, 50%mRCA -> medical management   TRANSTHORACIC ECHOCARDIOGRAM  01/2012   EF 55-60%. Mild LVH. No RWMA, mild Aortic Stenosis (mean/peak gradient 13 mmHg/21 mmHg)   TRANSTHORACIC ECHOCARDIOGRAM  May 2017, May 2019   A) EF 60-65% w/o RWMA. Normal DF for age. Mod AD (mean/peak gradient 24/35 mmHg); b) EF 60-65%, mod LVH, Mod AS (Mean gradient (S): 28 mm Hg. Peak 43 mmHg   US CAROTID DOPPLER BILATERAL ([facility] HX) Bilateral 02/05/2016   Stable mild-to-moderate disease with bilateral heterogeneous plaque. RICA - 6-44%, LICA 03-47%. Bilateral subclavian and vertebral arteries normal.     reports that he quit smoking about 31 years ago. His smoking use included cigarettes. He has never used smokeless tobacco. He reports that he does not drink alcohol or use drugs.  Allergies  Allergen Reactions   Dust Mite Extract Other (See Comments)    Sneezing, coughing   Mold Extract [Trichophyton] Other (See Comments)    Sneezing,  coughing   Pollen Extract Other (See Comments)    Sneezing, coughing   Tetracyclines & Related Other (See Comments)    Generic only    Family History  Problem Relation Age of Onset   Diabetes Mother    Stroke Mother    Hypertension Mother    Hyperlipidemia Mother    Cancer Father    Hypertension Brother    Cancer Other     Prior to Admission medications   Medication  Sig Start Date End Date Taking? Authorizing Provider  b complex vitamins tablet Take 1 tablet by mouth daily.   Yes [provider]  busPIRone (BUSPAR) 5 MG tablet Take 5 mg by mouth daily as needed. anxiety   Yes [provider]  donepezil (ARICEPT) 10 MG tablet TAKE 1/2 (ONE-HALF) TABLET BY MOUTH AT BEDTIME (Needs to be seen before next refill) 10/13/19  Yes Hassell Done, Mary-Margaret, FNP  Fluticasone-Salmeterol (ADVAIR DISKUS) 100-50 MCG/DOSE AEPB Inhale 1 puff into the lungs every 12 (twelve) hours. 10/13/19  Yes Martin, Mary-Margaret, FNP  Ipratropium-Albuterol (COMBIVENT) 20-100 MCG/ACT AERS respimat Inhale 1 puff into the lungs every 6 (six) hours. 10/13/19  Yes Hassell Done, Mary-Margaret, FNP  isosorbide mononitrate (IMDUR) 30 MG 24 hr tablet Take 1 tablet (30 mg total) by mouth daily. 10/13/19  Yes Martin, Mary-Margaret, FNP  lisinopril (ZESTRIL) 20 MG tablet Take 1 tablet (20 mg total) by mouth daily. 10/13/19  Yes Hassell Done, Mary-Margaret, FNP  memantine (NAMENDA) 10 MG tablet Take 1 tablet (10 mg total) by mouth 2 (two) times daily. 10/13/19  Yes Martin, Mary-Margaret, FNP  sertraline (ZOLOFT) 100 MG tablet Take 1 tablet (100 mg total) by mouth daily. Patient taking differently: Take 150 mg by mouth daily.  10/13/19  Yes Martin, Mary-Margaret, FNP  Travoprost, BAK Free, (TRAVATAN) 0.004 % SOLN ophthalmic solution Place 1 drop into both eyes at bedtime.   Yes [provider]  Accu-Chek Softclix Lancets lancets CHECK BLOOD SUGAR DAILY AND AS NEEDED Dx E11.9 08/17/19   Hassell Done, Mary-Margaret, FNP  aspirin EC 81 MG tablet Take 1 tablet (81 mg total) by mouth daily. Patient not taking: Reported on 01/10/2020 05/20/19   Leonie Man, MD  Blood Glucose Monitoring Suppl (ACCU-CHEK AVIVA PLUS) w/Device KIT Check blood sugar daily and as needed 08/17/19   Chevis Pretty, FNP  glucose blood (ACCU-CHEK AVIVA PLUS) test strip Test blood sugar daily and as needed Dx E11.9 08/17/19   Chevis Pretty, FNP  nitroGLYCERIN (NITROSTAT) 0.4 MG SL tablet Use 1 tabket under tongue at onset and may repeat every 5 minutes x2 Patient not taking: Reported on 01/10/2020 12/01/17   Chevis Pretty, FNP    Physical Exam: Constitutional: Moderately built and nourished. Vitals:   01/10/20 2000 01/10/20 2100 01/10/20 2131 01/10/20 2152  BP: (!) 148/67 134/62 138/67 (!) 147/65  Pulse:  86 96 93  Resp: '14 19 16   '$ Temp:   98.9 F (37.2 C) 98.4 F (36.9 C)  TempSrc:   Oral   SpO2:  97% 98% 99%  Weight:      Height:       Eyes: Anicteric no pallor. ENMT: No discharge from the ears eyes nose or mouth. Neck: No mass felt.  No neck rigidity. Respiratory: No rhonchi or crepitations. Cardiovascular: S1-S2 heard. Abdomen: Soft nontender bowel sounds present. Musculoskeletal: No edema. Skin: No rash. Neurologic: Alert awake oriented to his name.  Moves all extremities. Psychiatric: Has dementia.   Labs on Admission: I have personally reviewed following labs  and imaging studies  CBC: Recent Labs  Lab 01/10/20 1323 01/10/20 1333  WBC 15.5*  --   NEUTROABS 12.7*  --   HGB 12.5* 12.9*  HCT 37.7* 38.0*  MCV 89.5  --   PLT 129*  --    Basic Metabolic Panel: Recent Labs  Lab 01/10/20 1323 01/10/20 1333  NA 134* 134*  K 4.8 4.7  CL 100 98  CO2 24  --   GLUCOSE 177* 171*  BUN 40* 38*  CREATININE 1.90* 1.80*  CALCIUM 8.9  --    GFR: Estimated Creatinine Clearance: 29.2 mL/min (A) (by C-G formula based on SCr of 1.8 mg/dL (H)). Liver Function Tests: Recent Labs  Lab 01/10/20 1323  AST 17  ALT 19  ALKPHOS 45  BILITOT 2.2*  PROT 6.6  ALBUMIN 3.8   No results for input(s): LIPASE, AMYLASE in the last 168 hours. No results for input(s): AMMONIA in the last 168 hours. Coagulation Profile: Recent Labs  Lab 01/10/20 1323  INR 1.3*   Cardiac Enzymes: No results for input(s): CKTOTAL, CKMB, CKMBINDEX, TROPONINI in the last 168 hours. BNP (last 3 results) No  results for input(s): PROBNP in the last 8760 hours. HbA1C: No results for input(s): HGBA1C in the last 72 hours. CBG: No results for input(s): GLUCAP in the last 168 hours. Lipid Profile: No results for input(s): CHOL, HDL, LDLCALC, TRIG, CHOLHDL, LDLDIRECT in the last 72 hours. Thyroid Function Tests: No results for input(s): TSH, T4TOTAL, FREET4, T3FREE, THYROIDAB in the last 72 hours. Anemia Panel: No results for input(s): VITAMINB12, FOLATE, FERRITIN, TIBC, IRON, RETICCTPCT in the last 72 hours. Urine analysis:    Component Value Date/Time   COLORURINE YELLOW 01/10/2020 1843   APPEARANCEUR CLEAR 01/10/2020 1843   LABSPEC 1.024 01/10/2020 1843   PHURINE 5.0 01/10/2020 1843   GLUCOSEU NEGATIVE 01/10/2020 1843   HGBUR NEGATIVE 01/10/2020 1843   BILIRUBINUR NEGATIVE 01/10/2020 1843   KETONESUR NEGATIVE 01/10/2020 1843   PROTEINUR NEGATIVE 01/10/2020 1843   NITRITE NEGATIVE 01/10/2020 1843   LEUKOCYTESUR NEGATIVE 01/10/2020 1843   Sepsis Labs: '@LABRCNTIP'$ (procalcitonin:4,lacticidven:4) ) Recent Results (from the past 240 hour(s))  Respiratory Panel by RT PCR (Flu A&B, Covid) -     Status: None   Collection Time: 01/10/20  6:43 PM  Result Value Ref Range Status   SARS Coronavirus 2 by RT PCR NEGATIVE NEGATIVE Final    Comment: (NOTE) SARS-CoV-2 target nucleic acids are NOT DETECTED. The SARS-CoV-2 RNA is generally detectable in upper respiratoy specimens during the acute phase of infection. The lowest concentration of SARS-CoV-2 viral copies this assay can detect is 131 copies/mL. A negative result does not preclude SARS-Cov-2 infection and should not be used as the sole basis for treatment or other patient management decisions. A negative result may occur with  improper specimen collection/handling, submission of specimen other than nasopharyngeal swab, presence of viral mutation(s) within the areas targeted by this assay, and inadequate number of viral copies (<131  copies/mL). A negative result must be combined with clinical observations, patient history, and epidemiological information. The expected result is Negative. Fact Sheet for Patients:  PinkCheek.be Fact Sheet for Healthcare Providers:  GravelBags.it This test is not yet ap proved or cleared by the [place] FDA and  has been authorized for detection and/or diagnosis of SARS-CoV-2 by FDA under an Emergency Use Authorization (EUA). This EUA will remain  in effect (meaning this test can be used) for the duration of the COVID-19 declaration under Section 564(b)(1) of the  Act, 21 U.S.C. section 360bbb-3(b)(1), unless the authorization is terminated or revoked sooner.    Influenza A by PCR NEGATIVE NEGATIVE Final   Influenza B by PCR NEGATIVE NEGATIVE Final    Comment: (NOTE) The Xpert Xpress SARS-CoV-2/FLU/RSV assay is intended as an aid in  the diagnosis of influenza from Nasopharyngeal swab specimens and  should not be used as a sole basis for treatment. Nasal washings and  aspirates are unacceptable for Xpert Xpress SARS-CoV-2/FLU/RSV  testing. Fact Sheet for Patients: PinkCheek.be Fact Sheet for Healthcare Providers: GravelBags.it This test is not yet approved or cleared by the [place] FDA and  has been authorized for detection and/or diagnosis of SARS-CoV-2 by  FDA under an Emergency Use Authorization (EUA). This EUA will remain  in effect (meaning this test can be used) for the duration of the  Covid-19 declaration under Section 564(b)(1) of the Act, 21  U.S.C. section 360bbb-3(b)(1), unless the authorization is  terminated or revoked. Performed at [facility], [place] [address], [place], [place] [address]      Radiological Exams on Admission: CT HEAD WO CONTRAST  Result Date: 01/10/2020 CLINICAL DATA:  Dementia, altered mental status greater than  normal, walks with show full, history diabetes mellitus, hypertension, hyperlipidemia, coronary artery disease, former smoker EXAM: CT HEAD WITHOUT CONTRAST TECHNIQUE: Contiguous axial images were obtained from the base of the skull through the vertex without intravenous contrast. Sagittal and coronal MPR images reconstructed from axial data set. COMPARISON:  11/08/2006 FINDINGS: Brain: Beam hardening artifacts of dental origin. Scattered motion artifacts, decreased on repeat imaging. Generalized atrophy. Normal ventricular morphology. No midline shift or mass effect. Small vessel chronic ischemic changes of deep cerebral white matter. No intracranial hemorrhage, mass lesion, or evidence of acute infarction. No extra-axial fluid collections. Vascular: Atherosclerotic calcification of internal carotid and vertebral arteries at skull base. No definite hyperdense vessels. Skull: No definite osseous abnormalities within limitations of motion artifacts. Sinuses/Orbits: Visualized paranasal sinuses and mastoid air cells clear Other: N/A IMPRESSION: Atrophy with small vessel chronic ischemic changes of deep cerebral white matter. No definite intracranial abnormalities are identified on exam limited by patient motion, despite repeating images. Electronically Signed   By: Lavonia Dana M.D.   On: 01/10/2020 15:58   CT ABDOMEN PELVIS W CONTRAST  Result Date: 01/10/2020 CLINICAL DATA:  Diffuse abdominal pain with rebound tenderness. Dementia. EXAM: CT ABDOMEN AND PELVIS WITH CONTRAST TECHNIQUE: Multidetector CT imaging of the abdomen and pelvis was performed using the standard protocol following bolus administration of intravenous contrast. CONTRAST:  123m OMNIPAQUE IOHEXOL 300 MG/ML  SOLN COMPARISON:  Report from study 04/24/2003. FINDINGS: Lower chest: Patchy density in both lower lobes could be atelectasis or mild basilar pneumonia. No dense consolidation or lobar collapse. No pleural effusion. Hepatobiliary: Liver  parenchyma is normal.  No calcified gallstones. Pancreas: Pancreatic atrophy and calcifications. No evidence of mass or pancreatitis. Spleen: Normal Adrenals/Urinary Tract: No adrenal mass. Left kidney is normal, with vascular calcifications. 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 That situation persists, and there is also fullness of the renal collecting system of the lower pole. Ureters appear to join midportion. 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 I do not see an acutely obstructing phenomenon. Stomach/Bowel: No abnormality is seen affecting the stomach or small intestine. There is a large amount of fecal  matter in the rectosigmoid region. I do not see any evidence of acute bowel inflammation. Vascular/Lymphatic: Aortic atherosclerosis. No aneurysm. IVC is normal. No retroperitoneal adenopathy. Reproductive: Otherwise normal Other: No free fluid or air. Musculoskeletal: Ordinary lumbar degenerative changes. IMPRESSION: No abnormality seen to explain the acute abdomen presentation. I do not see any evidence of acute bowel pathology or other acute abdominal organ pathology. Bilateral lower lobe pulmonary infiltrates consistent with bilateral lower lobe pneumonia. 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 Chronic atrophic pancreatitis changes. No evidence of acute pancreatitis. Aortic Atherosclerosis (ICD10-I70.0). Electronically Signed   By: Nelson Chimes M.D.   On: 01/10/2020 16:25   DG Chest Port 1 View  Result Date: 01/10/2020 CLINICAL DATA:  Altered mental status EXAM: PORTABLE CHEST 1 VIEW COMPARISON:   01/17/2016 FINDINGS: The heart size and mediastinal contours are within normal limits. Atherosclerotic calcification of the thoracic aorta. No focal airspace consolidation, pleural effusion, or pneumothorax. The visualized skeletal structures are unremarkable. IMPRESSION: No active disease. Electronically Signed   By: Davina Poke D.O.   On: 01/10/2020 14:37    EKG: Independently reviewed.  Normal sinus rhythm.  Assessment/Plan Principal Problem:   CAP (community acquired pneumonia) Active Problems:   COPD (chronic obstructive pulmonary disease) (HCC)   Essential hypertension, benign   Moderate aortic stenosis    1. Pneumonia -at this time we are treating as community-acquired pneumonia with ceftriaxone and Zithromax will check swallow evaluation. 2. Acute encephalopathy could be from pneumonia.  Will check MRI brain to rule out any CVA. 3. Abdominal bilirubin with epigastric pain with CAT scan showing chronic pancreatitis changes will follow LFTs.  If bilirubin still elevated will check MRCP. 4. Hypertension since patient has been ordered to get swallow evaluation we will keep patient n.p.o. and IV hydralazine until patient can swallow and take home medications. 5. Diabetes mellitus type 2 on sliding scale coverage for now. 6. COPD not actively wheezing. 7. Anemia and thrombocytopenia appears to be chronic follow CBC. 8. Acute on chronic kidney disease stage II cause not clear could be from dehydration gently hydrate and follow metabolic panel.   DVT prophylaxis: Lovenox. Code Status: Full code as confirmed with patient's grand daughter. Family Communication: Patient's granddaughter. Disposition Plan: Home when stable. Consults called: Speech therapist. Admission status: Observation.   Rise Patience MD [facility] Pager [phone].  If 7PM-7AM, please contact night-coverage [email or web address] Password [facility]  01/10/2020, 11:54 PM

## 2020-01-10 NOTE — ED Notes (Signed)
Help get patient undress on the monitor patient is resting with call bell in reach and family at bedside 

## 2020-01-10 NOTE — ED Provider Notes (Signed)
Blood pressure (!) 104/57, pulse 82, temperature 98.6 F (37 C), temperature source Oral, resp. rate (!) 21, height 5\' 2"  (1.575 m), weight 65.8 kg, SpO2 98 %.  Assuming care from Dr. .  In short, Dean Harris is a 79 y.o. male with a chief complaint of Altered Mental Status .  Refer to the original H&P for additional details.  The current plan of care is to f/u on CT abdomen/pelvis and reassess.  Covid test is negative.  UA negative.  CT abdomen pelvis with no acute findings but lower lobe pneumonia likely in the CT read.  This correlates well with the patient's symptoms including mild hypoxemia and productive cough along with altered mental status.  Have started antibiotics and will admit.   Discussed patient's case with [facility], Dr. 73 to request admission. Patient and family (if present) updated with plan. Care transferred to [facility] service.  I reviewed all nursing notes, vitals, pertinent old records, EKGs, labs, imaging (as available).     [facility], MD 01/10/20 2012

## 2020-01-10 NOTE — Telephone Encounter (Signed)
FYI for provider

## 2020-01-10 NOTE — ED Notes (Signed)
Attempted report x1. 

## 2020-01-10 NOTE — ED Provider Notes (Signed)
[facility] Emergency Department Provider Note MRN:  [id number]  Arrival date & time: 01/10/20     Chief Complaint   Altered Mental Status   History of Present Illness   Dean Harris is a 79 y.o. year-old male with a history of CAD, diabetes, hyperlipidemia, dementia presenting to the ED with chief complaint of altered mental status.  Patient was complaining of abdominal pain yesterday, had a large bowel movement that helped somewhat.  Continued abdominal pain since that time.  This morning seems to be more confused than normal, seems to have worsened balance.  No fever, no other obvious complaints.  History obtained from granddaughter at bedside.  I was unable to obtain an accurate HPI, PMH, or ROS due to the patient's dementia.  Level 5 caveat.  Review of Systems  Positive for abdominal pain, balance issues, speech issues.  Patient's Health History    Past Medical History:  Diagnosis Date  . Chronic stable angina (HCC)   . 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 Depression   . Diabetes mellitus   . Essential hypertension   . Hyperlipidemia with target LDL less than 70   . 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 Pneumonia    x2  . PTSD (post-traumatic stress disorder) - with depression symptoms    Recently started on medication by the [place]. This has helped his memory issues.  . Thrombocytopenia (HCC) 04/29/2014  . Vitamin D deficiency     Past Surgical History:  Procedure Laterality Date  . CAROTID ENDARTERECTOMY    . LEFT HEART CATHETERIZATION WITH CORONARY ANGIOGRAM N/A 02/13/2012   Procedure: LEFT HEART CATHETERIZATION WITH CORONARY ANGIOGRAM;  Surgeon: Donato Schultz, MD;  Location: [facility] CATH LAB;  Service: Cardiovascular: Diffuse mid LAD 50%, 50%pOM1, 50%mRCA -> medical  management  . TRANSTHORACIC ECHOCARDIOGRAM  01/2012   EF 55-60%. Mild LVH. No RWMA, mild Aortic Stenosis (mean/peak gradient 13 mmHg/21 mmHg)  . TRANSTHORACIC ECHOCARDIOGRAM  May 2017, May 2019   A) EF 60-65% w/o RWMA. Normal DF for age. Mod AD (mean/peak gradient 24/35 mmHg); b) EF 60-65%, mod LVH, Mod AS (Mean gradient (S): 28 mm Hg. Peak 43 mmHg  . US CAROTID DOPPLER BILATERAL ([facility] HX) Bilateral 02/05/2016   Stable mild-to-moderate disease with bilateral heterogeneous plaque. RICA - 1-39%, LICA 40-59%. Bilateral subclavian and vertebral arteries normal.    Family History  Problem Relation Age of Onset  . Diabetes Mother   . Stroke Mother   . Hypertension Mother   . Hyperlipidemia Mother   . Cancer Father   . Hypertension Brother   . Cancer Other     Social History   Socioeconomic History  . Marital status: Married    Spouse name: Bonita Quin  . Number of children: 1  . Years of education: 12  . Highest education level: High school graduate  Occupational History  . Occupation: Retired     Comment: Custodian  Tobacco Use  . Smoking status: Former Smoker    Types: Cigarettes    Quit date: 02/10/1988    Years since quitting: 31.9  . Smokeless tobacco: Never Used  Substance and Sexual Activity  . Alcohol use: No  . Drug use: No  . Sexual activity: Not Currently    Comment: Married  Other Topics Concern  . Not on file  Social History Narrative   Lives at home w/ wife   Right-handed   Caffeine: 5-6 cups per day   Social Determinants of Health   Financial Resource Strain: Low Risk   . Difficulty of Paying Living Expenses: Not hard at all  Food Insecurity: No Food Insecurity  . Worried About Charity fundraiser in the Last Year: Never true  . Ran Out of Food in the Last Year: Never true  Transportation Needs: No Transportation Needs  . Lack of Transportation (Medical): No  . Lack of Transportation (Non-Medical): No  Physical Activity: Inactive  . Days of Exercise per  Week: 0 days  . Minutes of Exercise per Session: 0 min  Stress: No Stress Concern Present  . Feeling of Stress : Not at all  Social Connections: Not Isolated  . Frequency of Communication with Friends and Family: More than three times a week  . Frequency of Social Gatherings with Friends and Family: More than three times a week  . Attends Religious Services: More than 4 times per year  . Active Member of Clubs or Organizations: Yes  . Attends Archivist Meetings: More than 4 times per year  . Marital Status: Married  Human resources officer Violence: Not At Risk  . Fear of Current or Ex-Partner: No  . Emotionally Abused: No  . Physically Abused: No  . Sexually Abused: No     Physical Exam   Vitals:   01/10/20 1500 01/10/20 1515  BP: 116/63 (!) 104/57  Pulse: (!) 111 82  Resp: 17 (!) 21  Temp:    SpO2: 98% 98%    CONSTITUTIONAL: Chronically ill-appearing, NAD NEURO: Awake, alert, oriented to name only, no focal neurological deficits, moving all extremities equally. EYES:  eyes equal and reactive ENT/NECK:  no LAD, no JVD CARDIO: Regular rate, well-perfused, normal S1 and S2 PULM:  CTAB no wheezing or rhonchi GI/GU:  normal bowel sounds, non-distended, diffuse rebound tenderness MSK/SPINE:  No gross deformities, no edema SKIN:  no rash, atraumatic PSYCH:  Appropriate speech and behavior  *Additional and/or pertinent findings included in MDM below  Diagnostic and Interventional Summary    EKG Interpretation  Date/Time:  Tuesday January 10 2020 13:17:38 EDT Ventricular Rate:  80 PR Interval:  156 QRS Duration: 90 QT Interval:  384 QTC Calculation: 442 R Axis:   83 Text Interpretation: Normal sinus rhythm wandering baseline Consider right ventricular involvement in acute inferior infarct Abnormal ECG Confirmed by Gerlene Fee [phone]) on 01/10/2020 4:38:12 PM      Labs Reviewed  Meda Klinefelter - Abnormal; Notable for the following components:      Result Value    Prothrombin Time 15.6 (*)    INR 1.3 (*)    All other components within normal limits  APTT - Abnormal; Notable for the following components:   aPTT 37 (*)    All other components within normal limits  CBC - Abnormal; Notable for the following components:   WBC 15.5 (*)    RBC 4.21 (*)    Hemoglobin 12.5 (*)    HCT 37.7 (*)    Platelets 129 (*)    All other components within normal limits  DIFFERENTIAL - Abnormal; Notable for the following components:   Neutro Abs 12.7 (*)    Abs Immature Granulocytes 0.08 (*)    All other components within normal limits  COMPREHENSIVE METABOLIC PANEL - Abnormal; Notable for the following components:   Sodium 134 (*)    Glucose, Bld 177 (*)  BUN 40 (*)    Creatinine, Ser 1.90 (*)    Total Bilirubin 2.2 (*)    GFR calc non Af Amer 33 (*)    GFR calc Af Amer 39 (*)    All other components within normal limits  I-STAT CHEM 8, ED - Abnormal; Notable for the following components:   Sodium 134 (*)    BUN 38 (*)    Creatinine, Ser 1.80 (*)    Glucose, Bld 171 (*)    Calcium, Ion 1.13 (*)    Hemoglobin 12.9 (*)    HCT 38.0 (*)    All other components within normal limits  URINALYSIS, ROUTINE W REFLEX MICROSCOPIC  CBG MONITORING, ED    CT ABDOMEN PELVIS W CONTRAST  Final Result    CT HEAD WO CONTRAST  Final Result    DG Chest Port 1 View  Final Result      Medications  sodium chloride flush (NS) 0.9 % injection 3 mL (3 mLs Intravenous Given 01/10/20 1347)  sodium chloride 0.9 % bolus 1,000 mL (1,000 mLs Intravenous New Bag/Given 01/10/20 1446)  iohexol (OMNIPAQUE) 300 MG/ML solution 100 mL (100 mLs Intravenous Contrast Given 01/10/20 1557)     Procedures  /  Critical Care Procedures  ED Course and Medical Decision Making  I have reviewed the triage vital signs, the nursing notes, and pertinent available records from the EMR.  Listed above are laboratory and imaging tests that I personally ordered, reviewed, and interpreted and then  considered in my medical decision making (see below for details).      Worsened mental status off from baseline with abdominal pain, question of balance issues and worsened speech, considering abdominal or GI obstruction or other emergent process as patient seems to have rebound tenderness.  Also considering CNS process given the balance and speech concerns, however largely nonfocal exam currently.  Awaiting CT imaging of the head and abdomen, labs to evaluate for metabolic disarray, UTI.  Awaiting CT abdomen results, work-up thus far unrevealing.  Signed out to oncoming provider at shift change.  Elmer Sow. Pilar Plate, MD [place] [facility] Emergency Medicine [facility] [email or web address]  Final Clinical Impressions(s) / ED Diagnoses     ICD-10-CM   1. Altered mental status, unspecified altered mental status type  R41.82   2. Abdominal pain, unspecified abdominal location  R10.9     ED Discharge Orders    None       Discharge Instructions Discussed with and Provided to Patient:   Discharge Instructions   None       Sabas Sous, MD 01/10/20 [phone]

## 2020-01-10 NOTE — ED Triage Notes (Signed)
Pt arrives with family who reports pt has been off for about 2 days. 2 days ago, pt slept most of the day and had abd cramps. Pt has not complained about the abd cramps since having a BM yesterday. Family reports he has dementia but has been more altered that normal. Pt normally walks with shuffle. Unable to follow commands. No arm drifts.

## 2020-01-11 ENCOUNTER — Observation Stay (HOSPITAL_COMMUNITY): Payer: Medicare PPO

## 2020-01-11 DIAGNOSIS — Z8249 Family history of ischemic heart disease and other diseases of the circulatory system: Secondary | ICD-10-CM | POA: Diagnosis not present

## 2020-01-11 DIAGNOSIS — Z87891 Personal history of nicotine dependence: Secondary | ICD-10-CM | POA: Diagnosis not present

## 2020-01-11 DIAGNOSIS — E785 Hyperlipidemia, unspecified: Secondary | ICD-10-CM | POA: Diagnosis present

## 2020-01-11 DIAGNOSIS — F4310 Post-traumatic stress disorder, unspecified: Secondary | ICD-10-CM | POA: Diagnosis present

## 2020-01-11 DIAGNOSIS — Z833 Family history of diabetes mellitus: Secondary | ICD-10-CM | POA: Diagnosis not present

## 2020-01-11 DIAGNOSIS — E1122 Type 2 diabetes mellitus with diabetic chronic kidney disease: Secondary | ICD-10-CM | POA: Diagnosis present

## 2020-01-11 DIAGNOSIS — J42 Unspecified chronic bronchitis: Secondary | ICD-10-CM

## 2020-01-11 DIAGNOSIS — I10 Essential (primary) hypertension: Secondary | ICD-10-CM | POA: Diagnosis not present

## 2020-01-11 DIAGNOSIS — F0390 Unspecified dementia without behavioral disturbance: Secondary | ICD-10-CM | POA: Diagnosis present

## 2020-01-11 DIAGNOSIS — N182 Chronic kidney disease, stage 2 (mild): Secondary | ICD-10-CM | POA: Diagnosis present

## 2020-01-11 DIAGNOSIS — G9341 Metabolic encephalopathy: Secondary | ICD-10-CM | POA: Diagnosis present

## 2020-01-11 DIAGNOSIS — I350 Nonrheumatic aortic (valve) stenosis: Secondary | ICD-10-CM

## 2020-01-11 DIAGNOSIS — J189 Pneumonia, unspecified organism: Secondary | ICD-10-CM | POA: Diagnosis present

## 2020-01-11 DIAGNOSIS — R4182 Altered mental status, unspecified: Secondary | ICD-10-CM | POA: Diagnosis present

## 2020-01-11 DIAGNOSIS — Z83438 Family history of other disorder of lipoprotein metabolism and other lipidemia: Secondary | ICD-10-CM | POA: Diagnosis not present

## 2020-01-11 DIAGNOSIS — F329 Major depressive disorder, single episode, unspecified: Secondary | ICD-10-CM | POA: Diagnosis present

## 2020-01-11 DIAGNOSIS — R109 Unspecified abdominal pain: Secondary | ICD-10-CM

## 2020-01-11 DIAGNOSIS — K861 Other chronic pancreatitis: Secondary | ICD-10-CM | POA: Diagnosis present

## 2020-01-11 DIAGNOSIS — R17 Unspecified jaundice: Secondary | ICD-10-CM | POA: Diagnosis present

## 2020-01-11 DIAGNOSIS — Z20822 Contact with and (suspected) exposure to covid-19: Secondary | ICD-10-CM | POA: Diagnosis present

## 2020-01-11 DIAGNOSIS — N179 Acute kidney failure, unspecified: Secondary | ICD-10-CM | POA: Diagnosis present

## 2020-01-11 DIAGNOSIS — Q638 Other specified congenital malformations of kidney: Secondary | ICD-10-CM | POA: Diagnosis not present

## 2020-01-11 DIAGNOSIS — J440 Chronic obstructive pulmonary disease with acute lower respiratory infection: Secondary | ICD-10-CM | POA: Diagnosis present

## 2020-01-11 DIAGNOSIS — I129 Hypertensive chronic kidney disease with stage 1 through stage 4 chronic kidney disease, or unspecified chronic kidney disease: Secondary | ICD-10-CM | POA: Diagnosis present

## 2020-01-11 DIAGNOSIS — Z888 Allergy status to other drugs, medicaments and biological substances status: Secondary | ICD-10-CM | POA: Diagnosis not present

## 2020-01-11 DIAGNOSIS — D696 Thrombocytopenia, unspecified: Secondary | ICD-10-CM | POA: Diagnosis present

## 2020-01-11 DIAGNOSIS — R471 Dysarthria and anarthria: Secondary | ICD-10-CM | POA: Diagnosis not present

## 2020-01-11 DIAGNOSIS — R410 Disorientation, unspecified: Secondary | ICD-10-CM | POA: Diagnosis not present

## 2020-01-11 DIAGNOSIS — E559 Vitamin D deficiency, unspecified: Secondary | ICD-10-CM | POA: Diagnosis present

## 2020-01-11 LAB — GLUCOSE, CAPILLARY
Glucose-Capillary: 134 mg/dL — ABNORMAL HIGH (ref 70–99)
Glucose-Capillary: 105 mg/dL — ABNORMAL HIGH (ref 70–99)
Glucose-Capillary: 108 mg/dL — ABNORMAL HIGH (ref 70–99)
Glucose-Capillary: 156 mg/dL — ABNORMAL HIGH (ref 70–99)
Glucose-Capillary: 117 mg/dL — ABNORMAL HIGH (ref 70–99)

## 2020-01-11 LAB — CBC
HCT: 38.3 % — ABNORMAL LOW (ref 39.0–52.0)
Hemoglobin: 12.9 g/dL — ABNORMAL LOW (ref 13.0–17.0)
MCH: 29.8 pg (ref 26.0–34.0)
MCHC: 33.7 g/dL (ref 30.0–36.0)
MCV: 88.5 fL (ref 80.0–100.0)
Platelets: 104 10*3/uL — ABNORMAL LOW (ref 150–400)
RBC: 4.33 MIL/uL (ref 4.22–5.81)
RDW: 12.6 % (ref 11.5–15.5)
WBC: 9.8 10*3/uL (ref 4.0–10.5)
nRBC: 0 % (ref 0.0–0.2)

## 2020-01-11 LAB — HEPATIC FUNCTION PANEL
ALT: 18 U/L (ref 0–44)
AST: 21 U/L (ref 15–41)
Albumin: 3.3 g/dL — ABNORMAL LOW (ref 3.5–5.0)
Alkaline Phosphatase: 46 U/L (ref 38–126)
Bilirubin, Direct: 0.2 mg/dL (ref 0.0–0.2)
Indirect Bilirubin: 1.8 mg/dL — ABNORMAL HIGH (ref 0.3–0.9)
Total Bilirubin: 2 mg/dL — ABNORMAL HIGH (ref 0.3–1.2)
Total Protein: 6 g/dL — ABNORMAL LOW (ref 6.5–8.1)

## 2020-01-11 LAB — BASIC METABOLIC PANEL
Anion gap: 18 — ABNORMAL HIGH (ref 5–15)
BUN: 30 mg/dL — ABNORMAL HIGH (ref 8–23)
CO2: 15 mmol/L — ABNORMAL LOW (ref 22–32)
Calcium: 8.7 mg/dL — ABNORMAL LOW (ref 8.9–10.3)
Chloride: 103 mmol/L (ref 98–111)
Creatinine, Ser: 1.45 mg/dL — ABNORMAL HIGH (ref 0.61–1.24)
GFR calc Af Amer: 54 mL/min — ABNORMAL LOW (ref >60)
GFR calc non Af Amer: 46 mL/min — ABNORMAL LOW (ref >60)
Glucose, Bld: 128 mg/dL — ABNORMAL HIGH (ref 70–99)
Potassium: 4.3 mmol/L (ref 3.5–5.1)
Sodium: 136 mmol/L (ref 135–145)

## 2020-01-11 LAB — TROPONIN I (HIGH SENSITIVITY)
Troponin I (High Sensitivity): 128 ng/L (ref <18)
Troponin I (High Sensitivity): 151 ng/L (ref <18)
Troponin I (High Sensitivity): 141 ng/L (ref <18)

## 2020-01-11 LAB — LIPASE, BLOOD: Lipase: 19 U/L (ref 11–51)

## 2020-01-11 MED ORDER — INSULIN ASPART 100 UNIT/ML ~~LOC~~ SOLN
0.0000 [IU] | Freq: Three times a day (TID) | SUBCUTANEOUS | Status: DC
  Administered 2020-01-11: 2 [IU] via SUBCUTANEOUS
  Administered 2020-01-12: 1 [IU] via SUBCUTANEOUS
  Administered 2020-01-12: 2 [IU] via SUBCUTANEOUS
  Administered 2020-01-12: 3 [IU] via SUBCUTANEOUS
  Administered 2020-01-13: 1 [IU] via SUBCUTANEOUS
  Administered 2020-01-13: 3 [IU] via SUBCUTANEOUS
  Administered 2020-01-14: 2 [IU] via SUBCUTANEOUS

## 2020-01-11 MED ORDER — DONEPEZIL HCL 10 MG PO TABS
5.0000 mg | ORAL_TABLET | Freq: Every day | ORAL | Status: DC
  Administered 2020-01-13: 5 mg via ORAL
  Filled 2020-01-11 (×3): qty 1

## 2020-01-11 MED ORDER — MOMETASONE FURO-FORMOTEROL FUM 100-5 MCG/ACT IN AERO
2.0000 | INHALATION_SPRAY | Freq: Two times a day (BID) | RESPIRATORY_TRACT | Status: DC
  Administered 2020-01-12 – 2020-01-14 (×3): 2 via RESPIRATORY_TRACT
  Filled 2020-01-11: qty 8.8

## 2020-01-11 MED ORDER — IPRATROPIUM-ALBUTEROL 0.5-2.5 (3) MG/3ML IN SOLN
3.0000 mL | Freq: Four times a day (QID) | RESPIRATORY_TRACT | Status: DC
  Administered 2020-01-11 (×2): 3 mL via RESPIRATORY_TRACT
  Filled 2020-01-11 (×2): qty 3

## 2020-01-11 MED ORDER — ISOSORBIDE MONONITRATE ER 30 MG PO TB24
30.0000 mg | ORAL_TABLET | Freq: Every day | ORAL | Status: DC
  Administered 2020-01-11 – 2020-01-14 (×4): 30 mg via ORAL
  Filled 2020-01-11 (×4): qty 1

## 2020-01-11 MED ORDER — QUETIAPINE FUMARATE 25 MG PO TABS
12.5000 mg | ORAL_TABLET | Freq: Two times a day (BID) | ORAL | Status: DC
  Administered 2020-01-12 – 2020-01-13 (×2): 12.5 mg via ORAL
  Filled 2020-01-11 (×4): qty 1

## 2020-01-11 MED ORDER — MEMANTINE HCL 10 MG PO TABS
10.0000 mg | ORAL_TABLET | Freq: Two times a day (BID) | ORAL | Status: DC
  Administered 2020-01-11 – 2020-01-14 (×5): 10 mg via ORAL
  Filled 2020-01-11 (×7): qty 1

## 2020-01-11 MED ORDER — SERTRALINE HCL 100 MG PO TABS
100.0000 mg | ORAL_TABLET | Freq: Every day | ORAL | Status: DC
  Administered 2020-01-11 – 2020-01-14 (×4): 100 mg via ORAL
  Filled 2020-01-11 (×4): qty 1

## 2020-01-11 MED ORDER — IPRATROPIUM-ALBUTEROL 0.5-2.5 (3) MG/3ML IN SOLN
3.0000 mL | Freq: Two times a day (BID) | RESPIRATORY_TRACT | Status: DC
  Administered 2020-01-12: 3 mL via RESPIRATORY_TRACT
  Filled 2020-01-11: qty 3

## 2020-01-11 NOTE — Evaluation (Signed)
Physical Therapy Evaluation Patient Details Name: Dean Harris MRN: [id number] DOB: Jan 31, 1941 Today's Date: 01/11/2020   History of Present Illness  Dean Harris is a 79 y.o. male with history of dementia, nonobstructive CAD, bilateral carotid endarterectomy, hyperlipidemia, moderate aortic stenosis chronic stable angina admitted with increased confusion, difficulty ambulating and epigastric pain. CT abdomen showed bilateral infiltrates concerning for pneumonia but nothing acute in the abdomen but showed features of chronic pancreatitis. Found to have underlying COPD     Clinical Impression  Pt required minimal assist for ambulation and total care for ADLs prior to admit. Pt was able to ambulate with HHA with upright posture however pt now with significant retropulsion requiring modx2 to safely ambulate. Spoke with Granddaughter who has provided significant support to patient and desires to take patient home however unsure if they can manage him at this level. Acute PT to cont to follow to progress mobility.    Follow Up Recommendations SNF;Supervision/Assistance - 24 hour(unless family can provide 24/7 assist at mod level) - will continue to address    Equipment Recommendations  2 wheeled RW and 3n1 commode, granddaughter reports they ordered a BSC but waiting for it to come in.   Recommendations for Other Services       Precautions / Restrictions Precautions Precautions: Fall Precaution Comments: restless, bilat mitts Restrictions Weight Bearing Restrictions: No      Mobility  Bed Mobility Overal bed mobility: Needs Assistance Bed Mobility: Supine to Sit;Sit to Supine     Supine to sit: Max assist Sit to supine: Mod assist   General bed mobility comments: max directional verbal and tactile cues, modA for LE management, maxA for trunk elevation, minA to maintain EOB balance  Transfers Overall transfer level: Needs assistance Equipment used: 2 person hand held  assist(with gait belt) Transfers: Sit to/from Stand Sit to Stand: Mod assist;+2 physical assistance         General transfer comment: pt with significant retropulsion, bilat feet blocked from sliding, suspect pt with fear of falling however not verbal expressed by patient. with tactile cues posteriorly to transition body weight forward to neutral pt resistant  Ambulation/Gait Ambulation/Gait assistance: Max assist;Mod assist;+2 physical assistance Gait Distance (Feet): 20 Feet(to/from door) Assistive device: 2 person hand held assist Gait Pattern/deviations: Step-to pattern;Shuffle Gait velocity: slow Gait velocity interpretation: <1.8 ft/sec, indicate of risk for recurrent falls General Gait Details: pt with retropulsion and no foot clearance. Pt requiring posterior guidance to move forward, with increased step length however didn't clear foot off floor.   Stairs            Wheelchair Mobility    Modified Rankin (Stroke Patients Only)       Balance Overall balance assessment: Needs assistance Sitting-balance support: Feet supported;No upper extremity supported Sitting balance-Leahy Scale: Poor Sitting balance - Comments: requiring minA to maintain EOB balance, pt continuously turning over R shoulder   Standing balance support: Bilateral upper extremity supported Standing balance-Leahy Scale: Poor Standing balance comment: dependent on physical assist                             Pertinent Vitals/Pain Pain Assessment: Faces Faces Pain Scale: No hurt Pain Intervention(s): Monitored during session    Home Living Family/patient expects to be discharged to:: Private residence Living Arrangements: (granddaughter, spouse in rehab) Available Help at Discharge: Family;Available 24 hours/day Type of Home: House Home Access: Stairs to enter Entrance Stairs-Rails: Left(none on the  sidewalk) Secretary/administrator of Steps: 6 Home Layout: Two level Home  Equipment: Tub bench;Walker - 4 wheels Additional Comments: hired help 4x/wk for 3 hrs, granddaughter has adult daycare set up    Prior Function Level of Independence: Needs assistance   Gait / Transfers Assistance Needed: family assists with ambulation via HHA, was receiving HHPT 2x/wk  ADL's / Homemaking Assistance Needed: dependent for ADLs, can feed self with supervision  Comments: pt poor historian, unable to report PLOF, attempted to contact granddaughter no answer,message not left for privacy reasons     Hand Dominance        Extremity/Trunk Assessment   Upper Extremity Assessment Upper Extremity Assessment: Generalized weakness(pt fidigiting in the bed with the mittens)    Lower Extremity Assessment Lower Extremity Assessment: Generalized weakness(pt moves LEs however is very unsure on his feet)    Cervical / Trunk Assessment Cervical / Trunk Assessment: Normal  Communication   Communication: Expressive difficulties  Cognition Arousal/Alertness: Awake/alert Behavior During Therapy: Restless Overall Cognitive Status: History of cognitive impairments - at baseline                                 General Comments: pt with dementia at baseline however unsure of the severity. Pt with single step command follow approx 25% of time, pt with significant delay in response time. Pt stated name but no place, unable to recall hes in the hospital despite multiple re-orientation.       General Comments General comments (skin integrity, edema, etc.): no skin breakdown or edema    Exercises     Assessment/Plan    PT Assessment Patient needs continued PT services  PT Problem List Decreased strength;Decreased range of motion;Decreased activity tolerance;Decreased balance;Decreased mobility;Decreased coordination;Decreased cognition;Decreased knowledge of use of DME;Decreased safety awareness       PT Treatment Interventions DME instruction;Stair training;Gait  training;Functional mobility training;Therapeutic activities;Balance training;Therapeutic exercise    PT Goals (Current goals can be found in the Care Plan section)  Acute Rehab PT Goals PT Goal Formulation: Patient unable to participate in goal setting Time For Goal Achievement: 01/25/20 Potential to Achieve Goals: Fair    Frequency Min 3X/week   Barriers to discharge        Co-evaluation               AM-PAC PT "6 Clicks" Mobility  Outcome Measure Help needed turning from your back to your side while in a flat bed without using bedrails?: A Lot Help needed moving from lying on your back to sitting on the side of a flat bed without using bedrails?: A Lot Help needed moving to and from a bed to a chair (including a wheelchair)?: A Lot Help needed standing up from a chair using your arms (e.g., wheelchair or bedside chair)?: A Lot Help needed to walk in hospital room?: A Lot Help needed climbing 3-5 steps with a railing? : Total 6 Click Score: 11    End of Session Equipment Utilized During Treatment: Gait belt Activity Tolerance: Patient tolerated treatment well Patient left: in bed;with call bell/phone within reach;with bed alarm set;with restraints reapplied(bilat mittens re-applied) Nurse Communication: Mobility status PT Visit Diagnosis: Unsteadiness on feet (R26.81);Muscle weakness (generalized) (M62.81);Difficulty in walking, not elsewhere classified (R26.2)    Time: 1200-1220 PT Time Calculation (min) (ACUTE ONLY): 20 min   Charges:   PT Evaluation $PT Eval Moderate Complexity: 1 Mod  Kittie Plater, PT, DPT Acute Rehabilitation Services Pager #: [phone] Office #: [phone]   Berline Lopes 01/11/2020, 12:45 PM

## 2020-01-11 NOTE — Progress Notes (Addendum)
PROGRESS NOTE  Dean Harris [name] DOB: [birth year] DOA: 01/10/2020 PCP: Bennie Pierini, FNP   LOS: 0 days   Brief Narrative / Interim history: 79 year old male with history of dementia, nonobstructive CAD, bilateral carotid endarterectomy, hyperlipidemia, moderate aortic stenosis, chronic stable angina was brought to the hospital due to being increasingly confused and progressive weakness with difficulties to ambulate over the last couple of days while at home.  Patient also has been complaining of some epigastric pain and constipation, having to strain.  CT abdomen pelvis was done which showed bibasilar pneumonia but no acute findings in the abdomen.  Subjective / 24h Interval events: Quite confused, could not provide a coherent story.  Assessment & Plan: Principal Problem Bilateral lower lobe pneumonia-being treated as community-acquired pneumonia with ceftriaxone azithromycin, continue.  No current reports of aspiration, speech evaluation pending  Active Problems Acute metabolic encephalopathy-likely in the setting of underlying infectious process, he underwent an MRI of the brain which did not show any active strokes however it did show significant atrophy.  Isolated elevated bilirubin-appears to be improving, CT scan without any acute findings in the liver/gallbladder area.  Monitor  Elevated troponin-no chest pain, possibly representing demand.  He is followed by cardiology as an outpatient, has a history of nonocclusive CAD by cath however I do not see when the cath was done.  He is suspected to have some microvascular disease, continue Imdur  Hypertension-resume home medications  Type 2 diabetes mellitus-placed on sliding scale insulin  Underlying COPD-stable, no wheezing, continue home medications  Acute kidney injury on chronic kidney disease stage II-possibly due to p.o. intake, received IV fluids and creatinine already improved this morning but not back to  his baseline of ~ 1.2.  Hold ACE inhibitor  Anemia/Thrombocytopenia-appear to be chronic   Scheduled Meds: . donepezil  5 mg Oral QHS  . enoxaparin (LOVENOX) injection  30 mg Subcutaneous Daily  . Ipratropium-Albuterol  1 puff Inhalation Q6H  . isosorbide mononitrate  30 mg Oral Daily  . latanoprost  1 drop Both Eyes QHS  . memantine  10 mg Oral BID  . mometasone-formoterol  2 puff Inhalation BID  . sertraline  100 mg Oral Daily   Continuous Infusions: . azithromycin Stopped (01/10/20 2051)  . cefTRIAXone (ROCEPHIN)  IV Stopped (01/10/20 2042)  . lactated ringers 75 mL/hr at 01/11/20 0006   PRN Meds:.hydrALAZINE, ondansetron **OR** ondansetron (ZOFRAN) IV  DVT prophylaxis: Lovenox Code Status: Full code Family Communication: no family at bedside  Patient admitted from: home Anticipated d/c place: TBD Barriers to d/c: Remains quite encephalopathic this morning, far from baseline, weak awaiting physical therapy evaluation.  Remains n.p.o. awaiting speech evaluation  Consultants:  None   Procedures:  None   Microbiology  None   Antimicrobials: Ceftriaxone / Azithromycin 4/27 >>    Objective: Vitals:   01/10/20 2131 01/10/20 2152 01/11/20 0830 01/11/20 1039  BP: 138/67 (!) 147/65 (!) 165/75 (!) 149/84  Pulse: 96 93 94 85  Resp: 16  17 15   Temp: 98.9 F (37.2 C) 98.4 F (36.9 C)    TempSrc: Oral     SpO2: 98% 99% 99% 100%  Weight:      Height:        Intake/Output Summary (Last 24 hours) at 01/11/2020 1055 Last data filed at 01/11/2020 0945 Gross per 24 hour  Intake --  Output 1 ml  Net -1 ml   Filed Weights   01/10/20 1317  Weight: 65.8 kg    Examination:  Constitutional: NAD Eyes: no scleral icterus ENMT: Mucous membranes are moist.  Neck: normal, supple Respiratory: no wheezing, no crackles. Normal respiratory effort.  Faint rhonchi at the bases Cardiovascular: Regular rate and rhythm, 3/6 SEM. No LE edema. Good peripheral pulses Abdomen: non  distended, no tenderness. Bowel sounds positive.  Musculoskeletal: no clubbing / cyanosis.  Skin: no rashes Neurologic: CN 2-12 grossly intact. Strength 5/5 in all 4.  Psychiatric: Alert to self only   Data Reviewed: I have independently reviewed following labs and imaging studies   CBC: Recent Labs  Lab 01/10/20 1323 01/10/20 1333 01/11/20 0225  WBC 15.5*  --  9.8  NEUTROABS 12.7*  --   --   HGB 12.5* 12.9* 12.9*  HCT 37.7* 38.0* 38.3*  MCV 89.5  --  88.5  PLT 129*  --  104*   Basic Metabolic Panel: Recent Labs  Lab 01/10/20 1323 01/10/20 1333 01/11/20 0225  NA 134* 134* 136  K 4.8 4.7 4.3  CL 100 98 103  CO2 24  --  15*  GLUCOSE 177* 171* 128*  BUN 40* 38* 30*  CREATININE 1.90* 1.80* 1.45*  CALCIUM 8.9  --  8.7*   Liver Function Tests: Recent Labs  Lab 01/10/20 1323 01/11/20 0628  AST 17 21  ALT 19 18  ALKPHOS 45 46  BILITOT 2.2* 2.0*  PROT 6.6 6.0*  ALBUMIN 3.8 3.3*   Coagulation Profile: Recent Labs  Lab 01/10/20 1323  INR 1.3*   HbA1C: No results for input(s): HGBA1C in the last 72 hours. CBG: Recent Labs  Lab 01/11/20 0142 01/11/20 0834  GLUCAP 134* 105*    Recent Results (from the past 240 hour(s))  Respiratory Panel by RT PCR (Flu A&B, Covid) -     Status: None   Collection Time: 01/10/20  6:43 PM  Result Value Ref Range Status   SARS Coronavirus 2 by RT PCR NEGATIVE NEGATIVE Final    Comment: (NOTE) SARS-CoV-2 target nucleic acids are NOT DETECTED. The SARS-CoV-2 RNA is generally detectable in upper respiratoy specimens during the acute phase of infection. The lowest concentration of SARS-CoV-2 viral copies this assay can detect is 131 copies/mL. A negative result does not preclude SARS-Cov-2 infection and should not be used as the sole basis for treatment or other patient management decisions. A negative result may occur with  improper specimen collection/handling, submission of specimen other than nasopharyngeal swab, presence  of viral mutation(s) within the areas targeted by this assay, and inadequate number of viral copies (<131 copies/mL). A negative result must be combined with clinical observations, patient history, and epidemiological information. The expected result is Negative. Fact Sheet for Patients:  https://www.moore.com/ Fact Sheet for Healthcare Providers:  https://www.young.biz/ This test is not yet ap proved or cleared by the [place] FDA and  has been authorized for detection and/or diagnosis of SARS-CoV-2 by FDA under an Emergency Use Authorization (EUA). This EUA will remain  in effect (meaning this test can be used) for the duration of the COVID-19 declaration under Section 564(b)(1) of the Act, 21 U.S.C. section 360bbb-3(b)(1), unless the authorization is terminated or revoked sooner.    Influenza A by PCR NEGATIVE NEGATIVE Final   Influenza B by PCR NEGATIVE NEGATIVE Final    Comment: (NOTE) The Xpert Xpress SARS-CoV-2/FLU/RSV assay is intended as an aid in  the diagnosis of influenza from Nasopharyngeal swab specimens and  should not be used as a sole basis for treatment. Nasal washings and  aspirates are unacceptable for Xpert  Xpress SARS-CoV-2/FLU/RSV  testing. Fact Sheet for Patients: PinkCheek.be Fact Sheet for Healthcare Providers: GravelBags.it This test is not yet approved or cleared by the [place] FDA and  has been authorized for detection and/or diagnosis of SARS-CoV-2 by  FDA under an Emergency Use Authorization (EUA). This EUA will remain  in effect (meaning this test can be used) for the duration of the  Covid-19 declaration under Section 564(b)(1) of the Act, 21  U.S.C. section 360bbb-3(b)(1), unless the authorization is  terminated or revoked. Performed at [facility], [place] [address] [place], [place] [address]      Radiology Studies: CT HEAD WO  CONTRAST  Result Date: 01/10/2020 CLINICAL DATA:  Dementia, altered mental status greater than normal, walks with show full, history diabetes mellitus, hypertension, hyperlipidemia, coronary artery disease, former smoker EXAM: CT HEAD WITHOUT CONTRAST TECHNIQUE: Contiguous axial images were obtained from the base of the skull through the vertex without intravenous contrast. Sagittal and coronal MPR images reconstructed from axial data set. COMPARISON:  11/08/2006 FINDINGS: Brain: Beam hardening artifacts of dental origin. Scattered motion artifacts, decreased on repeat imaging. Generalized atrophy. Normal ventricular morphology. No midline shift or mass effect. Small vessel chronic ischemic changes of deep cerebral white matter. No intracranial hemorrhage, mass lesion, or evidence of acute infarction. No extra-axial fluid collections. Vascular: Atherosclerotic calcification of internal carotid and vertebral arteries at skull base. No definite hyperdense vessels. Skull: No definite osseous abnormalities within limitations of motion artifacts. Sinuses/Orbits: Visualized paranasal sinuses and mastoid air cells clear Other: N/A IMPRESSION: Atrophy with small vessel chronic ischemic changes of deep cerebral white matter. No definite intracranial abnormalities are identified on exam limited by patient motion, despite repeating images. Electronically Signed   By: Lavonia Dana M.D.   On: 01/10/2020 15:58   MR BRAIN WO CONTRAST  Result Date: 01/11/2020 CLINICAL DATA:  79 year old male with increasing confusion and difficulty walking. EXAM: MRI HEAD WITHOUT CONTRAST TECHNIQUE: Multiplanar, multiecho pulse sequences of the brain and surrounding structures were obtained without intravenous contrast. COMPARISON:  Head CT 01/10/2020. Brain MRI 10/10/2016, 11/08/2006. FINDINGS: Brain: No restricted diffusion to suggest acute infarction. No midline shift, mass effect, evidence of mass lesion, extra-axial collection or acute  intracranial hemorrhage. Cervicomedullary junction and pituitary are within normal limits. Chronic generalized ventricular enlargement, mildly increased from 2018 in conjunction with similar increased generalized brain volume loss, and no transependymal edema. Atrophied bilateral mesial temporal lobes out of proportion to other cerebral volume loss (series 9, images 15 and 16). Subtle cortical encephalomalacia in the posterior right parietal lobe on series 6, image 15 is stable. No other cortical encephalomalacia. No chronic cerebral blood products identified. And otherwise gray and white matter signal is essentially normal for age (mild nonspecific white matter signal changes are stable since 2018. Vascular: Major intracranial vascular flow voids are stable since 2018. Dominant left vertebral artery. Skull and upper cervical spine: Negative for age visible cervical spine and marrow signal. Sinuses/Orbits: Stable, negative. Other: Mastoids remain well pneumatized. Visible internal auditory structures appear normal. Scalp and face soft tissues appear negative. IMPRESSION: 1. No acute intracranial abnormality. 2. Chronic ventricular enlargement progressed since 2018 along with generalized cerebral volume loss. However, there is also chronic disproportionate atrophy of the mesial temporal lobes, also increased. 3. Minimal for age chronic ischemic changes, subtle chronic cortical encephalomalacia in the right parietal lobe. Electronically Signed   By: Genevie Ann M.D.   On: 01/11/2020 10:10   CT ABDOMEN PELVIS W CONTRAST  Result Date:  01/10/2020 CLINICAL DATA:  Diffuse abdominal pain with rebound tenderness. Dementia. EXAM: CT ABDOMEN AND PELVIS WITH CONTRAST TECHNIQUE: Multidetector CT imaging of the abdomen and pelvis was performed using the standard protocol following bolus administration of intravenous contrast. CONTRAST:  OMNIPAQUE IOHEXOL 300 MG/ML  SOLN COMPARISON:  Report from study 04/24/2003. FINDINGS:  Lower chest: Patchy density in both lower lobes could be atelectasis or mild basilar pneumonia. No dense consolidation or lobar collapse. No pleural effusion. Hepatobiliary: Liver parenchyma is normal.  No calcified gallstones. Pancreas: Pancreatic atrophy and calcifications. No evidence of mass or pancreatitis. Spleen: Normal Adrenals/Urinary Tract: No adrenal mass. Left kidney is normal, with vascular calcifications. 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 That situation persists, and there is also fullness of the renal collecting system of the lower pole. Ureters appear to join midportion. 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 I do not see an acutely obstructing phenomenon. Stomach/Bowel: No abnormality is seen affecting the stomach or small intestine. There is a large amount of fecal matter in the rectosigmoid region. I do not see any evidence of acute bowel inflammation. Vascular/Lymphatic: Aortic atherosclerosis. No aneurysm. IVC is normal. No retroperitoneal adenopathy. Reproductive: Otherwise normal Other: No free fluid or air. Musculoskeletal: Ordinary lumbar degenerative changes. IMPRESSION: No abnormality seen to explain the acute abdomen presentation. I do not see any evidence of acute bowel pathology or other acute abdominal organ pathology. Bilateral lower lobe pulmonary infiltrates consistent with bilateral lower lobe pneumonia. 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 Chronic atrophic pancreatitis changes. No evidence of acute pancreatitis. Aortic Atherosclerosis (ICD10-I70.0). Electronically Signed   By:  Paulina Fusi M.D.   On: 01/10/2020 16:25   DG Chest Port 1 View  Result Date: 01/10/2020 CLINICAL DATA:  Altered mental status EXAM: PORTABLE CHEST 1 VIEW COMPARISON:  01/17/2016 FINDINGS: The heart size and mediastinal contours are within normal limits. Atherosclerotic calcification of the thoracic aorta. No focal airspace consolidation, pleural effusion, or pneumothorax. The visualized skeletal structures are unremarkable. IMPRESSION: No active disease. Electronically Signed   By: Duanne Guess D.O.   On: 01/10/2020 14:37   Pamella Pert, MD, PhD [facility]  Between 7 am - 7 pm I am available, please contact me via Amion or Securechat  Between 7 pm - 7 am I am not available, please contact night coverage MD/APP via Amion

## 2020-01-11 NOTE — Progress Notes (Signed)
CRITICAL VALUE ALERT  Critical Value: Troponin high sensitivity 128  Date & Time Notied:  01/11/2020  Provider Notified: MD Wendy Poet  Orders Received/Actions taken:01/11/2020 [phone] Md will put in orders to trend troponin.

## 2020-01-11 NOTE — Evaluation (Signed)
Clinical/Bedside Swallow Evaluation Patient Details  Name: CHRISTIA COAXUM MRN: [id number] Date of Birth: 10-01-1940  Today's Date: 01/11/2020 Time: SLP Start Time (ACUTE ONLY): 1136 SLP Stop Time (ACUTE ONLY): 1149 SLP Time Calculation (min) (ACUTE ONLY): 13 min  Past Medical History:  Past Medical History:  Diagnosis Date  . Chronic stable angina (HCC)   . 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 Depression   . Diabetes mellitus   . Essential hypertension   . Hyperlipidemia with target LDL less than 70   . 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 Pneumonia    x2  . PTSD (post-traumatic stress disorder) - with depression symptoms    Recently started on medication by the [place]. This has helped his memory issues.  . Thrombocytopenia (HCC) 04/29/2014  . Vitamin D deficiency    Past Surgical History:  Past Surgical History:  Procedure Laterality Date  . CAROTID ENDARTERECTOMY    . LEFT HEART CATHETERIZATION WITH CORONARY ANGIOGRAM N/A 02/13/2012   Procedure: LEFT HEART CATHETERIZATION WITH CORONARY ANGIOGRAM;  Surgeon: Donato Schultz, MD;  Location: [facility] CATH LAB;  Service: Cardiovascular: Diffuse mid LAD 50%, 50%pOM1, 50%mRCA -> medical management  . TRANSTHORACIC ECHOCARDIOGRAM  01/2012   EF 55-60%. Mild LVH. No RWMA, mild Aortic Stenosis (mean/peak gradient 13 mmHg/21 mmHg)  . TRANSTHORACIC ECHOCARDIOGRAM  May 2017, May 2019   A) EF 60-65% w/o RWMA. Normal DF for age. Mod AD (mean/peak gradient 24/35 mmHg); b) EF 60-65%, mod LVH, Mod AS (Mean gradient (S): 28 mm Hg. Peak 43 mmHg  . US CAROTID DOPPLER BILATERAL ([facility] HX) Bilateral 02/05/2016   Stable mild-to-moderate disease with bilateral heterogeneous plaque. RICA - 1-39%, LICA 40-59%. Bilateral subclavian and vertebral arteries normal.   HPI:  RUSTY VILLELLA is  a 79 y.o. male with history of dementia, nonobstructive CAD, bilateral carotid endarterectomy, hyperlipidemia, moderate aortic stenosis chronic stable angina admitted with increased confusion, difficulty ambulating and epigastric pain. CT abdomen showed bilateral infiltrates concerning for pneumonia but nothing acute in the abdomen but showed features of chronic pancreatitis. Found to have underlying COPD    Assessment / Plan / Recommendation Clinical Impression  Pt exhibited reduced labial opening and more suckle acceptance from spoon often present with pt's having advanced dementia. Mastication mildly inefficient and pt vocalizing when chewing. No s/s aspriation with multiple straw presentations thin, pudding or solid. Aspiration risk increased with cognitive impairments- no recent CXR prior to this admissin seen to suggest pneumonia. Recommend Dys 1 (puree) texture, thin liquids, straws allowed, crush pills and full assist with meals. ST will follow for possible upgrade while hospitalized versus at next venue of care in addition to safety with recommendations.   SLP Visit Diagnosis: Dysphagia, unspecified (R13.10)    Aspiration Risk  Mild aspiration risk;Moderate aspiration risk    Diet Recommendation Dysphagia 1 (Puree);Thin liquid   Liquid Administration via: Cup;Straw Medication Administration: Crushed with puree Supervision: Full supervision/cueing for compensatory strategies;Staff to assist with self feeding Compensations: Minimize environmental distractions;Slow rate;Small sips/bites;Lingual sweep for clearance of pocketing Postural Changes: Seated upright at 90 degrees    Other  Recommendations Oral Care Recommendations: Oral care BID   Follow up Recommendations Skilled Nursing facility      Frequency and Duration min 1 x/week  2 weeks       Prognosis Prognosis for Safe Diet  Advancement: Fair Barriers to Reach Goals: Cognitive deficits      Swallow Study   General HPI:  JAMILL WETMORE is a 79 y.o. male with history of dementia, nonobstructive CAD, bilateral carotid endarterectomy, hyperlipidemia, moderate aortic stenosis chronic stable angina admitted with increased confusion, difficulty ambulating and epigastric pain. CT abdomen showed bilateral infiltrates concerning for pneumonia but nothing acute in the abdomen but showed features of chronic pancreatitis. Found to have underlying COPD  Type of Study: Bedside Swallow Evaluation Previous Swallow Assessment: (none) Diet Prior to this Study: NPO Temperature Spikes Noted: No Respiratory Status: Room air History of Recent Intubation: No Behavior/Cognition: Alert;Cooperative;Pleasant mood;Confused;Requires cueing;Distractible Oral Cavity Assessment: Within Functional Limits Oral Care Completed by SLP: No Oral Cavity - Dentition: Adequate natural dentition;Missing dentition Vision: Functional for self-feeding Self-Feeding Abilities: Needs assist Patient Positioning: Upright in bed Baseline Vocal Quality: Normal Volitional Cough: Cognitively unable to elicit Volitional Swallow: Unable to elicit    Oral/Motor/Sensory Function Overall Oral Motor/Sensory Function: (no focal weakness)   Ice Chips Ice chips: Not tested   Thin Liquid Thin Liquid: Impaired Presentation: Straw Pharyngeal  Phase Impairments: Other (comments)(? coordination)    Nectar Thick Nectar Thick Liquid: Not tested   Honey Thick Honey Thick Liquid: Not tested   Puree Puree: Within functional limits   Solid     Solid: Impaired Oral Phase Impairments: Reduced lingual movement/coordination;Impaired mastication Oral Phase Functional Implications: Impaired mastication      Houston Siren 01/11/2020,12:05 PM  Orbie Pyo Colvin Caroli.Ed Risk analyst [phone] Office [phone]

## 2020-01-11 NOTE — Telephone Encounter (Signed)
Looks like he was admitted to the hospital

## 2020-01-11 NOTE — Progress Notes (Signed)
Patient assessed on room air.  Saturation greater than 94% No respiratory distress.  CT shows bibasilar infiltrates which bronchodilators can not reverse.  Deep breathing and coughing and IS will be most beneficial.

## 2020-01-12 ENCOUNTER — Encounter (HOSPITAL_COMMUNITY): Payer: Self-pay | Admitting: Internal Medicine

## 2020-01-12 DIAGNOSIS — F0390 Unspecified dementia without behavioral disturbance: Secondary | ICD-10-CM

## 2020-01-12 LAB — CBC
HCT: 35.3 % — ABNORMAL LOW (ref 39.0–52.0)
Hemoglobin: 11.9 g/dL — ABNORMAL LOW (ref 13.0–17.0)
MCH: 29.9 pg (ref 26.0–34.0)
MCHC: 33.7 g/dL (ref 30.0–36.0)
MCV: 88.7 fL (ref 80.0–100.0)
Platelets: 114 10*3/uL — ABNORMAL LOW (ref 150–400)
RBC: 3.98 MIL/uL — ABNORMAL LOW (ref 4.22–5.81)
RDW: 12.6 % (ref 11.5–15.5)
WBC: 7.1 10*3/uL (ref 4.0–10.5)
nRBC: 0 % (ref 0.0–0.2)

## 2020-01-12 LAB — COMPREHENSIVE METABOLIC PANEL
ALT: 18 U/L (ref 0–44)
AST: 19 U/L (ref 15–41)
Albumin: 3 g/dL — ABNORMAL LOW (ref 3.5–5.0)
Alkaline Phosphatase: 38 U/L (ref 38–126)
Anion gap: 10 (ref 5–15)
BUN: 22 mg/dL (ref 8–23)
CO2: 24 mmol/L (ref 22–32)
Calcium: 8.6 mg/dL — ABNORMAL LOW (ref 8.9–10.3)
Chloride: 105 mmol/L (ref 98–111)
Creatinine, Ser: 1.24 mg/dL (ref 0.61–1.24)
GFR calc Af Amer: >60 mL/min (ref >60)
GFR calc non Af Amer: 56 mL/min — ABNORMAL LOW (ref >60)
Glucose, Bld: 132 mg/dL — ABNORMAL HIGH (ref 70–99)
Potassium: 3.9 mmol/L (ref 3.5–5.1)
Sodium: 139 mmol/L (ref 135–145)
Total Bilirubin: 1.3 mg/dL — ABNORMAL HIGH (ref 0.3–1.2)
Total Protein: 5.6 g/dL — ABNORMAL LOW (ref 6.5–8.1)

## 2020-01-12 LAB — GLUCOSE, CAPILLARY
Glucose-Capillary: 107 mg/dL — ABNORMAL HIGH (ref 70–99)
Glucose-Capillary: 123 mg/dL — ABNORMAL HIGH (ref 70–99)
Glucose-Capillary: 128 mg/dL — ABNORMAL HIGH (ref 70–99)
Glucose-Capillary: 158 mg/dL — ABNORMAL HIGH (ref 70–99)
Glucose-Capillary: 221 mg/dL — ABNORMAL HIGH (ref 70–99)

## 2020-01-12 LAB — AMMONIA: Ammonia: 27 umol/L (ref 9–35)

## 2020-01-12 LAB — VITAMIN B12: Vitamin B-12: 559 pg/mL (ref 180–914)

## 2020-01-12 LAB — TSH: TSH: 2.47 u[IU]/mL (ref 0.350–4.500)

## 2020-01-12 MED ORDER — ENOXAPARIN SODIUM 40 MG/0.4ML ~~LOC~~ SOLN
40.0000 mg | Freq: Every day | SUBCUTANEOUS | Status: DC
  Administered 2020-01-13 – 2020-01-14 (×2): 40 mg via SUBCUTANEOUS
  Filled 2020-01-12 (×2): qty 0.4

## 2020-01-12 MED ORDER — IPRATROPIUM-ALBUTEROL 0.5-2.5 (3) MG/3ML IN SOLN: 3.0000 mL | Freq: Four times a day (QID) | RESPIRATORY_TRACT | Status: DC | PRN

## 2020-01-12 MED ORDER — AZITHROMYCIN 250 MG PO TABS
500.0000 mg | ORAL_TABLET | Freq: Every day | ORAL | Status: DC
  Administered 2020-01-12 – 2020-01-14 (×3): 500 mg via ORAL
  Filled 2020-01-12 (×3): qty 2

## 2020-01-12 NOTE — Progress Notes (Signed)
Patient is unable to follow commands to take his inhaler.

## 2020-01-12 NOTE — TOC Initial Note (Signed)
Transition of Care [facility]) - Initial/Assessment Note    Patient Details  Name: Dean Harris MRN: [id number] Date of Birth: 1941/07/18  Transition of Care [facility]) CM/SW Contact:    Baldemar Lenis, LCSW Phone Number: 01/12/2020, 4:31 PM  Clinical Narrative:   CSW spoke with patient's granddaughter, [name], to discuss recommendation for SNF. Granddaughter discussed concerns related to patient's dementia and possibly worsening if placed in a facility, and CSW reviewed options for taking patient home. Patient is already set with home health through [facility], would need DME at home (wheelchair, walker). Granddaughter has concerns about patient's mobility currently as there are stairs no matter where they take the patient (he has been staying with the granddaughter, where there's two stories with bedrooms on the second floor, or if they took the patient back to his house and stayed with him there's still stairs to enter the house) and they have not made sure that the patient can handle stairs as of yet. CSW to follow for PT to evaluate stairs with the patient prior to discharge. CSW updated MD about plan, and sent orders for [facility] and DME to [facility] and [facility]. CSW to follow.         Expected Discharge Plan: Home w Home Health Services Barriers to Discharge: Continued Medical Work up   Patient Goals and CMS Choice Patient states their goals for this hospitalization and ongoing recovery are:: patient unable to participate in goal setting due to disorientation CMS Medicare.gov Compare Post Acute Care list provided to:: Patient Represenative (must comment) Choice offered to / list presented to : Adult Children(Granddaughter)  Expected Discharge Plan and Services Expected Discharge Plan: Home w Home Health Services     Post Acute Care Choice: Home Health Living arrangements for the past 2 months: Single Family Home                 DME Arranged: Walker rolling, Wheelchair manual DME Agency:  [facility] Date DME Agency Contacted: 01/12/20   Representative spoke with at DME Agency: Ian Malkin HH Arranged: PT, OT, Social Work [employer] Agency: [facility] Date [facility] Agency Contacted: 01/12/20   Representative spoke with at [facility] Agency: Kenard Gower  Prior Living Arrangements/Services Living arrangements for the past 2 months: Single Family Home Lives with:: Adult Children(Granddaughter) Patient language and need for interpreter reviewed:: No Do you feel safe going back to the place where you live?: Yes      Need for Family Participation in Patient Care: Yes (Comment) Care giver support system in place?: Yes (comment) Current home services: Home OT, Home PT, DME, [facility] Criminal Activity/Legal Involvement Pertinent to Current Situation/Hospitalization: No - Comment as needed  Activities of Daily Living      Permission Sought/Granted Permission sought to share information with : Family Supports Permission granted to share information with : Yes, Verbal Permission Granted  Share Information with NAME: [name]     Permission granted to share info w Relationship: Granddaughter     Emotional Assessment   Attitude/Demeanor/Rapport: Unable to Assess Affect (typically observed): Unable to Assess Orientation: : Oriented to Self Alcohol / Substance Use: Not Applicable Psych Involvement: No (comment)  Admission diagnosis:  CAP (community acquired pneumonia) [J18.9] Abdominal pain, unspecified abdominal location [R10.9] Altered mental status, unspecified altered mental status type [R41.82] AMS (altered mental status) [R41.82] Patient Active Problem List   Diagnosis Date Noted  . AMS (altered mental status) 01/11/2020  . CAP (community acquired pneumonia) 01/10/2020  . Weight loss, non-intentional 05/20/2019  . Depression,  recurrent ([place]) 12/01/2017  . Memory disorder 12/01/2017  . Carotid artery plaque, bilateral; s/p Bilateral CEA 07/24/2016  . Moderate aortic stenosis 01/28/2016   . Chronic stable angina ([place]) 01/28/2016  . BMI 30.0-30.9,adult 07/09/2015  . Thrombocytopenia ([place]) 04/29/2014  . Essential hypertension, benign 03/03/2014  . Coronary artery disease, non-occlusive   . Sleep apnea 11/30/2010  . Gout 11/30/2010  . Hyperlipidemia associated with type 2 diabetes mellitus ([place]) 11/30/2010  . Rosacea 11/30/2010  . COPD (chronic obstructive pulmonary disease) ([place]) 11/30/2010  . ED (erectile dysfunction) 11/30/2010  . Peripheral neuropathy 11/30/2010  . Diabetes ([place]) 09/16/1999   PCP:  Chevis Pretty, FNP Pharmacy:   [facility] [address], [place] [address] Phone: [phone] Fax: [phone]  [facility] [address] [place] - [address] [place] [place] [address] Phone: [phone] Fax: [phone]     Social Determinants of Health (SDOH) Interventions    Readmission Risk Interventions No flowsheet data found.

## 2020-01-12 NOTE — Progress Notes (Signed)
PROGRESS NOTE  Dean Harris CBS:[id number] DOB: Aug 05, 1941 DOA: 01/10/2020 PCP: Chevis Pretty, FNP   LOS: 1 day   Brief Narrative / Interim history: 79 year old male with history of dementia, nonobstructive CAD, bilateral carotid endarterectomy, hyperlipidemia, moderate aortic stenosis, chronic stable angina was brought to the hospital due to being increasingly confused and progressive weakness with difficulties to ambulate over the last couple of days while at home.  Patient also has been complaining of some epigastric pain and constipation, having to strain.  CT abdomen pelvis was done which showed bibasilar pneumonia but no acute findings in the abdomen.  Subjective / 24h Interval events: Confused, still appears to have slurred speech this morning.  Cannot tell me his name but otherwise cannot carry a conversation  Assessment & Plan: Principal Problem Bilateral lower lobe pneumonia-being treated as community-acquired pneumonia with ceftriaxone azithromycin, continue.  No reports of aspiration, speech therapy evaluated and recommended dysphagia 1 with thin liquids  Active Problems Acute metabolic encephalopathy /slurred speech-likely in the setting of underlying infectious process, he underwent an MRI of the brain which did not show any active strokes however it did show significant atrophy.  Patient still has slurred speech this morning, have consulted neurology for further evaluation, appreciate input  Isolated elevated bilirubin-appears to be improving, CT scan without any acute findings in the liver/gallbladder area.  Bilirubin improving, he is able to eat, no abdominal pain, no nausea or vomiting  Elevated troponin-no chest pain, possibly representing demand.  He is followed by cardiology as an outpatient, has a history of nonocclusive CAD by cath however I do not see when the cath was done.  He is suspected to have some microvascular disease, continue  Imdur  Hypertension-resume home medications  Type 2 diabetes mellitus-placed on sliding scale insulin, continue  Underlying COPD /asthma-stable, no wheezing, continue home medications  Acute kidney injury on chronic kidney disease stage II-possibly due to p.o. intake, received IV fluids and creatinine already improved this morning but not back to his baseline of ~ 1.2.  Hold ACE inhibitor  Anemia/Thrombocytopenia-appear to be chronic   Scheduled Meds: . azithromycin  500 mg Oral Daily  . donepezil  5 mg Oral QHS  . [START ON 01/13/2020] enoxaparin (LOVENOX) injection  40 mg Subcutaneous Daily  . insulin aspart  0-9 Units Subcutaneous TID WC  . ipratropium-albuterol  3 mL Inhalation BID  . isosorbide mononitrate  30 mg Oral Daily  . latanoprost  1 drop Both Eyes QHS  . memantine  10 mg Oral BID  . mometasone-formoterol  2 puff Inhalation BID  . QUEtiapine  12.5 mg Oral BID  . sertraline  100 mg Oral Daily   Continuous Infusions: . cefTRIAXone (ROCEPHIN)  IV 2 g (01/11/20 1703)   PRN Meds:.hydrALAZINE, ondansetron **OR** ondansetron (ZOFRAN) IV  DVT prophylaxis: Lovenox Code Status: Full code Family Communication: Discussed with granddaughter and her husband at bedside Patient admitted from: home Anticipated d/c place: TBD Barriers to d/c: Continues to have slurred speech, neurology evaluation pending.  Family still discussing whether they wish SNF  Consultants:  None   Procedures:  None   Microbiology  None   Antimicrobials: Ceftriaxone / Azithromycin 4/27 >>    Objective: Vitals:   01/11/20 2127 01/12/20 0743 01/12/20 0744 01/12/20 0801  BP: 123/73   (!) 157/78  Pulse: 89   84  Resp:    (!) 24  Temp: 98.9 F (37.2 C)   98.1 F (36.7 C)  TempSrc: Oral  SpO2: 94% 94% 94% 99%  Weight:      Height:        Intake/Output Summary (Last 24 hours) at 01/12/2020 1419 Last data filed at 01/12/2020 0353 Gross per 24 hour  Intake 1117.26 ml  Output 1150 ml   Net -32.74 ml   Filed Weights   01/10/20 1317  Weight: 65.8 kg    Examination:  Constitutional: No apparent distress, mittens on Eyes: No scleral icterus ENMT: Moist membranes Neck: normal, supple Respiratory: Diminished at the bases but no wheezing, crackles, good air movement Cardiovascular: Regular rate and rhythm, 3/6 SEM.  No edema Abdomen: Soft, nondistended, bowel sounds positive Musculoskeletal: no clubbing / cyanosis.  Skin: No rashes seen Neurologic: Does not follow commands consistently but appears nonfocal  Data Reviewed: I have independently reviewed following labs and imaging studies   CBC: Recent Labs  Lab 01/10/20 1323 01/10/20 1333 01/11/20 0225 01/12/20 0548  WBC 15.5*  --  9.8 7.1  NEUTROABS 12.7*  --   --   --   HGB 12.5* 12.9* 12.9* 11.9*  HCT 37.7* 38.0* 38.3* 35.3*  MCV 89.5  --  88.5 88.7  PLT 129*  --  104* 114*   Basic Metabolic Panel: Recent Labs  Lab 01/10/20 1323 01/10/20 1333 01/11/20 0225 01/12/20 0548  NA 134* 134* 136 139  K 4.8 4.7 4.3 3.9  CL 100 98 103 105  CO2 24  --  15* 24  GLUCOSE 177* 171* 128* 132*  BUN 40* 38* 30* 22  CREATININE 1.90* 1.80* 1.45* 1.24  CALCIUM 8.9  --  8.7* 8.6*   Liver Function Tests: Recent Labs  Lab 01/10/20 1323 01/11/20 0628 01/12/20 0548  AST 17 21 19   ALT 19 18 18   ALKPHOS 45 46 38  BILITOT 2.2* 2.0* 1.3*  PROT 6.6 6.0* 5.6*  ALBUMIN 3.8 3.3* 3.0*   Coagulation Profile: Recent Labs  Lab 01/10/20 1323  INR 1.3*   HbA1C: No results for input(s): HGBA1C in the last 72 hours. CBG: Recent Labs  Lab 01/11/20 1647 01/11/20 2140 01/11/20 2359 01/12/20 0802 01/12/20 1159  GLUCAP 156* 117* 107* 128* 158*    Recent Results (from the past 240 hour(s))  Respiratory Panel by RT PCR (Flu A&B, Covid) -     Status: None   Collection Time: 01/10/20  6:43 PM  Result Value Ref Range Status   SARS Coronavirus 2 by RT PCR NEGATIVE NEGATIVE Final    Comment: (NOTE) SARS-CoV-2 target  nucleic acids are NOT DETECTED. The SARS-CoV-2 RNA is generally detectable in upper respiratoy specimens during the acute phase of infection. The lowest concentration of SARS-CoV-2 viral copies this assay can detect is 131 copies/mL. A negative result does not preclude SARS-Cov-2 infection and should not be used as the sole basis for treatment or other patient management decisions. A negative result may occur with  improper specimen collection/handling, submission of specimen other than nasopharyngeal swab, presence of viral mutation(s) within the areas targeted by this assay, and inadequate number of viral copies (<131 copies/mL). A negative result must be combined with clinical observations, patient history, and epidemiological information. The expected result is Negative. Fact Sheet for Patients:  01/14/20 Fact Sheet for Healthcare Providers:  01/12/20 This test is not yet ap proved or cleared by the https://www.moore.com/ FDA and  has been authorized for detection and/or diagnosis of SARS-CoV-2 by FDA under an Emergency Use Authorization (EUA). This EUA will remain  in effect (meaning this test can be used)  for the duration of the COVID-19 declaration under Section 564(b)(1) of the Act, 21 U.S.C. section 360bbb-3(b)(1), unless the authorization is terminated or revoked sooner.    Influenza A by PCR NEGATIVE NEGATIVE Final   Influenza B by PCR NEGATIVE NEGATIVE Final    Comment: (NOTE) The Xpert Xpress SARS-CoV-2/FLU/RSV assay is intended as an aid in  the diagnosis of influenza from Nasopharyngeal swab specimens and  should not be used as a sole basis for treatment. Nasal washings and  aspirates are unacceptable for Xpert Xpress SARS-CoV-2/FLU/RSV  testing. Fact Sheet for Patients: https://www.moore.com/ Fact Sheet for Healthcare Providers: https://www.young.biz/ This test is not  yet approved or cleared by the [place] FDA and  has been authorized for detection and/or diagnosis of SARS-CoV-2 by  FDA under an Emergency Use Authorization (EUA). This EUA will remain  in effect (meaning this test can be used) for the duration of the  Covid-19 declaration under Section 564(b)(1) of the Act, 21  U.S.C. section 360bbb-3(b)(1), unless the authorization is  terminated or revoked. Performed at [facility], [address] [place], [place] [address]      Radiology Studies: No results found. Pamella Pert, MD, PhD [facility]  Between 7 am - 7 pm I am available, please contact me via Amion or Securechat  Between 7 pm - 7 am I am not available, please contact night coverage MD/APP via Amion

## 2020-01-12 NOTE — Consult Note (Addendum)
Neurology Consultation  Reason for Consult: Encephalopathy Referring Physician:Gherghe, Daylene Katayama, MD   CC: Encephalopathy  History is obtained from: Chart  HPI: Dean BOEHNING is a 79 y.o. male thrombocytopenia, PTSD, pneumonia, bilateral carotid endarterectomy dementia, hypertension, diabetes and CAD.  Patient was brought to the hospital secondary to be found increasingly confused and difficulty to ambulate over the last 48 hours at home.  On the day of admission he was also complaining of epigastric pain and was apparently straining to move his bowels.  In the ED he was found to have white blood cell count of 15.3, unremarkable EKG and was found to have bilateral infiltrates concerning for pneumonia.  While patient has been in the hospital he is apparently been more confused than usual and family felt he was slurring his words thus neurology was consulted.  On consultation patient is very agitated, will not let be do a formal exam and is fairly nonverbal.  I did call the granddaughter to get some history.  He apparently is a full ADL, pretty much nonverbal and is only aware to himself.  He does have some speech slurring at baseline and shuffled gait.  They noted that over the last couple days the shuffled gait had become worse and also the slurred speech has become worse.  Per nurse, today apparently granddaughters husband had seen the patient and stated his slurring of speech is actually improved slightly.  ED course  Relevant labs include -creatinine 1.45, white blood cell count initially 15.5 CT head shows-atrophy with small vessel chronic ischemic changes of the deep cerebral white matter.  No definite intracranial abnormalities are identified on exam however this was limited by patient's motion.  Chart review (patient was seen for memory disturbance back in 2018.  From looking at records he did not follow-up.)   Past Medical History:  Diagnosis Date  . Chronic stable angina (HCC)   .  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 Depression   . Diabetes mellitus   . Essential hypertension   . Hyperlipidemia with target LDL less than 70   . Memory difficulties   . 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 Pneumonia    x2  . PTSD (post-traumatic stress disorder) - with depression symptoms    Recently started on medication by the [place]. This has helped his memory issues.  . Thrombocytopenia (HCC) 04/29/2014  . Vitamin D deficiency      Family History  Problem Relation Age of Onset  . Diabetes Mother   . Stroke Mother   . Hypertension Mother   . Hyperlipidemia Mother   . Cancer Father   . Hypertension Brother   . Cancer Other    Social History:   reports that he quit smoking about 31 years ago. His smoking use included cigarettes. He has never used smokeless tobacco. He reports that he does not drink alcohol or use drugs.  Medications  Current Facility-Administered Medications:  .  azithromycin (ZITHROMAX) 500 mg in sodium chloride 0.9 % 250 mL IVPB, 500 mg, Intravenous, Q24H, Eduard Clos, MD, Last Rate: 250 mL/hr at 01/11/20 1822, 500 mg at 01/11/20 1822 .  cefTRIAXone (ROCEPHIN) 2 g in sodium chloride 0.9 % 100 mL IVPB, 2 g, Intravenous, Q24H, Eduard Clos, MD, Last Rate: 200 mL/hr at 01/11/20 1703, 2 g at 01/11/20  1703 .  donepezil (ARICEPT) tablet 5 mg, 5 mg, Oral, QHS, Gherghe, Costin M, MD .  enoxaparin (LOVENOX) injection 30 mg, 30 mg, Subcutaneous, Daily, Rise Patience, MD, 30 mg at 01/12/20 0840 .  hydrALAZINE (APRESOLINE) injection 10 mg, 10 mg, Intravenous, Q4H PRN, Rise Patience, MD .  insulin aspart (novoLOG) injection 0-9 Units, 0-9 Units, Subcutaneous, TID WC, Caren Griffins, MD, 1 Units at 01/12/20 0841 .  ipratropium-albuterol (DUONEB) 0.5-2.5 (3)  MG/3ML nebulizer solution 3 mL, 3 mL, Inhalation, BID, Caren Griffins, MD, 3 mL at 01/12/20 0743 .  isosorbide mononitrate (IMDUR) 24 hr tablet 30 mg, 30 mg, Oral, Daily, Caren Griffins, MD, 30 mg at 01/12/20 1062 .  latanoprost (XALATAN) 0.005 % ophthalmic solution 1 drop, 1 drop, Both Eyes, QHS, Rise Patience, MD .  memantine [facility]) tablet 10 mg, 10 mg, Oral, BID, Caren Griffins, MD, 10 mg at 01/12/20 0839 .  mometasone-formoterol (DULERA) 100-5 MCG/ACT inhaler 2 puff, 2 puff, Inhalation, BID, Caren Griffins, MD, 2 puff at 01/12/20 0743 .  ondansetron (ZOFRAN) tablet 4 mg, 4 mg, Oral, Q6H PRN **OR** ondansetron (ZOFRAN) injection 4 mg, 4 mg, Intravenous, Q6H PRN, Rise Patience, MD .  QUEtiapine (SEROQUEL) tablet 12.5 mg, 12.5 mg, Oral, BID, Caren Griffins, MD, 12.5 mg at 01/12/20 0839 .  sertraline (ZOLOFT) tablet 100 mg, 100 mg, Oral, Daily, Caren Griffins, MD, 100 mg at 01/12/20 0838  ROS:   Unable to obtain due to altered mental status.   Exam: Current vital signs: BP (!) 157/78 (BP Location: Right Arm)   Pulse 84   Temp 98.1 F (36.7 C)   Resp (!) 24   Ht 5\' 2"  (1.575 m)   Wt 65.8 kg   SpO2 99%   BMI 26.52 kg/m  Vital signs in last 24 hours: Temp:  [97.9 F (36.6 C)-98.9 F (37.2 C)] 98.1 F (36.7 C) (04/29 0801) Pulse Rate:  [84-101] 84 (04/29 0801) Resp:  [18-24] 24 (04/29 0801) BP: (123-157)/(73-78) 157/78 (04/29 0801) SpO2:  [94 %-100 %] 99 % (04/29 0801)   Constitutional: Appears well-developed and well-nourished.  Psych: Agitated when disturbed Eyes: No scleral injection HENT: No OP obstrucion Head: Normocephalic.  Cardiovascular: Palpable Respiratory: Effort normal, non-labored breathing GI: Soft.  No distension. There is no tenderness.  Skin: WDI  Neuro: Mental Status: Easily awakened with tactile stimuli.  Will not follow commands.  Becomes agitated when attempting to formally evaluate patient. Cranial Nerves: II:  Blinks to threat bilaterally III,IV, VI: Unable to evaluate pupils and or extraocular movement secondary to patient clenching his eyes tight. XII: tongue is midline without atrophy or fasciculations.  Motor: Patient shows full 5/5 strength in all extremities when attempting to push me away. Sensory: Withdraws from noxious stimuli in all 4 extremities. Deep Tendon Reflexes: Unable to assess secondary to person not relaxing Plantars: Downgoing bilaterally and withdraws briskly Cerebellar: Unable to examine  Labs I have reviewed labs in epic and the results pertinent to this consultation are:   CBC    Component Value Date/Time   WBC 7.1 01/12/2020 0548   RBC 3.98 (L) 01/12/2020 0548   HGB 11.9 (L) 01/12/2020 0548   HGB 13.1 09/01/2017 1654   HCT 35.3 (L) 01/12/2020 0548   HCT 39.3 09/01/2017 1654   PLT 114 (L) 01/12/2020 0548   PLT 144 (L) 09/01/2017 1654   MCV 88.7 01/12/2020 0548   MCV 89 09/01/2017 1654   MCH 29.9 01/12/2020  0548   MCHC 33.7 01/12/2020 0548   RDW 12.6 01/12/2020 0548   RDW 14.1 09/01/2017 1654   LYMPHSABS 2.0 01/10/2020 1323   LYMPHSABS 2.1 09/01/2017 1654   MONOABS 0.7 01/10/2020 1323   EOSABS 0.0 01/10/2020 1323   EOSABS 0.4 09/01/2017 1654   BASOSABS 0.1 01/10/2020 1323   BASOSABS 0.1 09/01/2017 1654    CMP     Component Value Date/Time   NA 139 01/12/2020 0548   NA 140 09/10/2018 1158   K 3.9 01/12/2020 0548   CL 105 01/12/2020 0548   CO2 24 01/12/2020 0548   GLUCOSE 132 (H) 01/12/2020 0548   BUN 22 01/12/2020 0548   BUN 20 09/10/2018 1158   CREATININE 1.24 01/12/2020 0548   CREATININE 1.14 02/09/2013 0954   CALCIUM 8.6 (L) 01/12/2020 0548   PROT 5.6 (L) 01/12/2020 0548   PROT 6.7 09/10/2018 1158   ALBUMIN 3.0 (L) 01/12/2020 0548   ALBUMIN 4.5 09/10/2018 1158   AST 19 01/12/2020 0548   ALT 18 01/12/2020 0548   ALKPHOS 38 01/12/2020 0548   BILITOT 1.3 (H) 01/12/2020 0548   BILITOT 1.0 09/10/2018 1158   GFRNONAA 56 (L) 01/12/2020  0548   GFRNONAA 65 02/09/2013 0954   GFRAA >60 01/12/2020 0548   GFRAA 75 02/09/2013 0954    Lipid Panel     Component Value Date/Time   CHOL 206 (H) 09/10/2018 1158   CHOL 145 02/09/2013 0954   TRIG 205 (H) 09/10/2018 1158   TRIG 168 (H) 02/01/2015 0950   TRIG 167 (H) 02/09/2013 0954   HDL 44 09/10/2018 1158   HDL 43 02/01/2015 0950   HDL 41 02/09/2013 0954   CHOLHDL 4.7 09/10/2018 1158   CHOLHDL 2.7 02/12/2012 0405   VLDL 11 02/12/2012 0405   LDLCALC 121 (H) 09/10/2018 1158   LDLCALC 72 03/03/2014 1120   LDLCALC 71 02/09/2013 0954     Imaging I have reviewed the images obtained:  CT-scan of the brain-atrophy with small vessel chronic ischemic changes of the deep cerebral white matter.  No definite intracranial abnormalities are identified on exam however this was limited by patient's motion.   MRI examination of the brain-no acute intracranial abnormality.  Chronic ventricular enlargement progressed since 2018 along with generalized cerebral volume loss.  However, there is also chronic disproportionate atrophy of the mesial temporal lobes, also increased.  Minimal for age chronic ischemic changes, subtle chronic cortical encephalomalacia in the high right parietal lobe.  Felicie Morn PA-C [facility] [phone]  M-F  (9:00 am- 5:00 PM)  01/12/2020, 12:12 PM     Assessment:  This is a 79 year old male with significant dementia requiring full ADLs and only aware of himself at times.  At baseline patient does have some dysarthria.  Patient was noted to have increased dysarthria along with increased bilateral lower extremity and general weakness.  At time of admission patient was found to have pneumonia which was thought to be community-acquired.  MRI was obtained which showed no abnormalities.  I believe patient's increased slurred speech, encephalopathy and weakness most likely is secondary to worsening of baseline symptoms in the setting of  infection.  Impression: -Dementia --Mild dysarthria -Baseline shuffling gait -Baseline full ADL  Recommendations: -Treat underlying infection -PT OT -Granddaughter is taking care of patient and patient appears to have good family support.  At some point he may need to be placed in a memory clinic however that would be in the future.  NEUROHOSPITALIST ADDENDUM Performed a face to  face diagnostic evaluation.   I have reviewed the contents of history and physical exam as documented by PA/ARNP/Resident and agree with above documentation.  I have discussed and formulated the above plan as documented. Edits to the note have been made as needed.  Neurology was consulted for dysarthria-on my assessment patient has minimal slurred speech.  Patient has known dementia, and it is expected for patients to have mild aphasia and dysarthria, likely exaggerated in the setting of metabolic disturbances.  Patient also speaks very slowly, and may have some parkinsonism -however this is difficult to evaluate in the inpatient setting.  MRI brain has ruled out acute stroke.  I do not suspect any other underlying process such as myasthenia.    Neurology will be available as needed.  No further work-up required inpatient.    Patient can follow-up with outpatient neurology for further management of dementia.    Georgiana Spinner Theda Payer MD [facility] [phone]   If 7pm to 7am, please call on call as listed on AMION.

## 2020-01-12 NOTE — Progress Notes (Signed)
Patient suffers from dementia which impairs their ability to perform daily activities like dressing, bathing in the home. A walker will not resolve  issue with performing activities of daily living. A wheelchair will allow patient to safely perform daily activities. Patient is not able to propel themselves in the home using a standard weight wheelchair due to weakness. Patient can self propel in the lightweight wheelchair. Length of need Lifetime.  Accessories: elevating leg rests (ELRs), wheel locks, extensions and anti-tippers.

## 2020-01-12 NOTE — Progress Notes (Signed)
Physical Therapy Treatment Patient Details Name: Dean Harris MRN: [id number] DOB: 1941/03/10 Today's Date: 01/12/2020    History of Present Illness Dean Harris is a 79 y.o. male with history of dementia, nonobstructive CAD, bilateral carotid endarterectomy, hyperlipidemia, moderate aortic stenosis chronic stable angina admitted with increased confusion, difficulty ambulating and epigastric pain. CT abdomen showed bilateral infiltrates concerning for pneumonia but nothing acute in the abdomen but showed features of chronic pancreatitis. Found to have underlying COPD     PT Comments    Pt is making good progress.  He required increased time, facilitation for posture and weight shift, and cues to correct posture in sitting prior to walking.  Pt's mobility improved significantly from beginning to end of PT session.  Granddaughter was present.  Spent increased time post therapy discussing options at discharge. One option is SNF but family fearful that 2 week quaratine at [facility] and unfamiliar environment pt will regress. Another option is home with 24 hr support and HHPT. If pt goes to granddaughters house his bedroom is upstairs and would likely need a hospital bed. If they stay with pt at his house it is 1 level and no need for hospital bed. Both houses have stairs to enter and pt would need EMS transport. Pt would also benefit from w/c due to variant tendency of dementia to provide safe means of mobility if pt having a "bad" day or needs emergency exit from house with stairs. Could also benefit from standard RW. Granddaughter is considering options.  .     Follow Up Recommendations  SNF;Supervision/Assistance - 24 hour(Granddaughter considering options - see PT comments above)     Equipment Recommendations  Wheelchair (measurements PT);Hospital bed;Rolling walker with 5" wheels    Recommendations for Other Services       Precautions / Restrictions Precautions Precautions: Fall     Mobility  Bed Mobility Overal bed mobility: Needs Assistance Bed Mobility: Supine to Sit;Sit to Supine     Supine to sit: Mod assist Sit to supine: Min assist   General bed mobility comments: Increased cues and time to initiate  Transfers Overall transfer level: Needs assistance Equipment used: Rolling walker (2 wheeled) Transfers: Sit to/from Stand Sit to Stand: Mod assist;Min guard         General transfer comment: Worked on sitting balance and leaning forward for 5-8 mins prior to standing.  Initial stand was mod A with retropulsion, but after ambulation pt performed sit to stand with min guard  Ambulation/Gait Ambulation/Gait assistance: Mod assist;Min guard;+2 safety/equipment Gait Distance (Feet): 150 Feet Assistive device: Rolling walker (2 wheeled) Gait Pattern/deviations: Shuffle Gait velocity: slow   General Gait Details: Pt initially requiring mod A for balance, RW management, and weight shifting.  Once out in hall demonstrated improved RW proximity, did not require weight shifting, and able to progress to min guard with min A for turns in tight areas.   Stairs             Wheelchair Mobility    Modified Rankin (Stroke Patients Only)       Balance Overall balance assessment: Needs assistance Sitting-balance support: Feet supported;No upper extremity supported Sitting balance-Leahy Scale: Fair Sitting balance - Comments: Initially requiring UE support and with posterior lean but able to progress to SBA with cues and task for reaching forward.  EOB for ~ 8 mins   Standing balance support: Bilateral upper extremity supported Standing balance-Leahy Scale: Fair Standing balance comment: initially posterior lean but progressed to SBA  post walk                            Cognition Arousal/Alertness: Awake/alert Behavior During Therapy: WFL for tasks assessed/performed Overall Cognitive Status: Impaired/Different from baseline                                  General Comments: Pt does have dementia but is normally able to follow commands and communicate more than he is at this time.  Granddaughter was present.  Pt did state his name.      Exercises      General Comments General comments (skin integrity, edema, etc.): Pt's granddaughter/caregiver was present.  Spent increased time post therapy discussing options at discharge.  One option is SNF but family fearful that 2 week quaratine at [facility] and unfamiliar environment pt will regress.  Another option is home with 24 hr support and HHPT.  If pt goes to granddaughters house his bedroom is upstairs and would likely need a hospital bed.  If they stay with pt at his house it is 1 level and no need for hospital bed.  Both houses have stairs to enter and pt would need EMS transport.  Pt would also benefit from w/c due to variant tendency of dementia to provide safe means of mobility if pt having a "bad" day or needs emergency exit from house with stairs.  Could also benefit from standard RW.  Granddaughter is considering options. Additionally, educated on dementia and working with therapy - highly encouraged working on leaning forward and correcting posterior lean in sitting prior to walking.      Pertinent Vitals/Pain Pain Assessment: No/denies pain    Home Living                      Prior Function            PT Goals (current goals can now be found in the care plan section) Acute Rehab PT Goals PT Goal Formulation: Patient unable to participate in goal setting Time For Goal Achievement: 01/25/20 Potential to Achieve Goals: Fair Progress towards PT goals: Progressing toward goals    Frequency    Min 3X/week      PT Plan Current plan remains appropriate    Co-evaluation              AM-PAC PT "6 Clicks" Mobility   Outcome Measure  Help needed turning from your back to your side while in a flat bed without using bedrails?: A Lot Help  needed moving from lying on your back to sitting on the side of a flat bed without using bedrails?: A Lot Help needed moving to and from a bed to a chair (including a wheelchair)?: A Lot Help needed standing up from a chair using your arms (e.g., wheelchair or bedside chair)?: A Little Help needed to walk in hospital room?: A Little Help needed climbing 3-5 steps with a railing? : A Lot 6 Click Score: 14    End of Session Equipment Utilized During Treatment: Gait belt Activity Tolerance: Patient tolerated treatment well Patient left: in bed;with call bell/phone within reach;with bed alarm set;with family/visitor present Nurse Communication: Mobility status PT Visit Diagnosis: Unsteadiness on feet (R26.81);Muscle weakness (generalized) (M62.81);Difficulty in walking, not elsewhere classified (R26.2)     Time: 4801-6553 PT Time Calculation (min) (ACUTE ONLY): 46 min  Charges:  $  Gait Training: 8-22 mins $Therapeutic Activity: 23-37 mins                     Royetta Asal, PT Acute Rehab Services Pager [phone] [facility] [phone] [facility] [phone]    Dean Harris 01/12/2020, 4:03 PM

## 2020-01-13 ENCOUNTER — Ambulatory Visit: Payer: Self-pay | Admitting: Nurse Practitioner

## 2020-01-13 LAB — CBC
HCT: 37.3 % — ABNORMAL LOW (ref 39.0–52.0)
Hemoglobin: 12.6 g/dL — ABNORMAL LOW (ref 13.0–17.0)
MCH: 29.6 pg (ref 26.0–34.0)
MCHC: 33.8 g/dL (ref 30.0–36.0)
MCV: 87.8 fL (ref 80.0–100.0)
Platelets: 122 10*3/uL — ABNORMAL LOW (ref 150–400)
RBC: 4.25 MIL/uL (ref 4.22–5.81)
RDW: 12.6 % (ref 11.5–15.5)
WBC: 7.3 10*3/uL (ref 4.0–10.5)
nRBC: 0 % (ref 0.0–0.2)

## 2020-01-13 LAB — COMPREHENSIVE METABOLIC PANEL
ALT: 16 U/L (ref 0–44)
AST: 20 U/L (ref 15–41)
Albumin: 3 g/dL — ABNORMAL LOW (ref 3.5–5.0)
Alkaline Phosphatase: 39 U/L (ref 38–126)
Anion gap: 9 (ref 5–15)
BUN: 21 mg/dL (ref 8–23)
CO2: 24 mmol/L (ref 22–32)
Calcium: 8.6 mg/dL — ABNORMAL LOW (ref 8.9–10.3)
Chloride: 105 mmol/L (ref 98–111)
Creatinine, Ser: 1.11 mg/dL (ref 0.61–1.24)
GFR calc Af Amer: >60 mL/min (ref >60)
GFR calc non Af Amer: >60 mL/min (ref >60)
Glucose, Bld: 132 mg/dL — ABNORMAL HIGH (ref 70–99)
Potassium: 3.8 mmol/L (ref 3.5–5.1)
Sodium: 138 mmol/L (ref 135–145)
Total Bilirubin: 1 mg/dL (ref 0.3–1.2)
Total Protein: 5.8 g/dL — ABNORMAL LOW (ref 6.5–8.1)

## 2020-01-13 LAB — GLUCOSE, CAPILLARY
Glucose-Capillary: 135 mg/dL — ABNORMAL HIGH (ref 70–99)
Glucose-Capillary: 205 mg/dL — ABNORMAL HIGH (ref 70–99)
Glucose-Capillary: 95 mg/dL (ref 70–99)
Glucose-Capillary: 227 mg/dL — ABNORMAL HIGH (ref 70–99)

## 2020-01-13 LAB — FOLATE RBC
Folate, Hemolysate: 580 ng/mL
Folate, RBC: 1676 ng/mL (ref >498)
Hematocrit: 34.6 % — ABNORMAL LOW (ref 37.5–51.0)

## 2020-01-13 LAB — MAGNESIUM: Magnesium: 1.8 mg/dL (ref 1.7–2.4)

## 2020-01-13 LAB — PHOSPHORUS: Phosphorus: 3.1 mg/dL (ref 2.5–4.6)

## 2020-01-13 NOTE — Plan of Care (Signed)
  Problem: Education: Goal: Knowledge of General Education information will improve Description: Including pain rating scale, medication(s)/side effects and non-pharmacologic comfort measures Outcome: Progressing   Problem: Health Behavior/Discharge Planning: Goal: Ability to manage health-related needs will improve Outcome: Progressing   Problem: Nutrition: Goal: Adequate nutrition will be maintained Outcome: Progressing  Family at bedside, verbalizes strategies for increasing intake and administering oral meds to confused patient.

## 2020-01-13 NOTE — Progress Notes (Signed)
PROGRESS NOTE  Dean Harris XFG:[id number] DOB: 11/04/1940 DOA: 01/10/2020 PCP: Chevis Pretty, FNP   LOS: 2 days   Brief Narrative / Interim history: 79 year old male with history of dementia, nonobstructive CAD, bilateral carotid endarterectomy, hyperlipidemia, moderate aortic stenosis, chronic stable angina was brought to the hospital due to being increasingly confused and progressive weakness with difficulties to ambulate over the last couple of days while at home.  Patient also has been complaining of some epigastric pain and constipation, having to strain.  CT abdomen pelvis was done which showed bibasilar pneumonia but no acute findings in the abdomen.  Subjective / 24h Interval events: Confused, he is a it more lethargic today   Assessment & Plan: Principal Problem Bilateral lower lobe pneumonia-being treated as community-acquired pneumonia with ceftriaxone azithromycin, continue.  No reports of aspiration, speech therapy evaluated and recommended dysphagia 1 with thin liquids.  Continue, transition to oral on discharge, anticipate he will go home tomorrow once more awake  Active Problems Acute metabolic encephalopathy /slurred speech-likely in the setting of underlying infectious process, he underwent an MRI of the brain which did not show any active strokes however it did show significant atrophy.  Patient still has slurred speech this morning, have consulted neurology for further evaluation, appreciate input he does not appear to have significant neurologic process, he is improving with antibiotics -He is more lethargic today likely due to Seroquel, patient does not take this medication at home, will discontinue and hopefully he will be more awake and able to go home tomorrow  Isolated elevated bilirubin-appears to be improving, CT scan without any acute findings in the liver/gallbladder area.  Bilirubin improving, he is able to eat, no abdominal pain, no nausea or  vomiting  Elevated troponin-no chest pain, possibly representing demand.  He is followed by cardiology as an outpatient, has a history of nonocclusive CAD by cath however I do not see when the cath was done.  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 intake, received IV fluids and creatinine has now normalized  Anemia/Thrombocytopenia-appear to be chronic   Scheduled Meds: . azithromycin  500 mg Oral Daily  . donepezil  5 mg Oral QHS  . enoxaparin (LOVENOX) injection  40 mg Subcutaneous Daily  . insulin aspart  0-9 Units Subcutaneous TID WC  . isosorbide mononitrate  30 mg Oral Daily  . latanoprost  1 drop Both Eyes QHS  . memantine  10 mg Oral BID  . mometasone-formoterol  2 puff Inhalation BID  . sertraline  100 mg Oral Daily   Continuous Infusions: . cefTRIAXone (ROCEPHIN)  IV 2 g (01/12/20 1736)   PRN Meds:.hydrALAZINE, ipratropium-albuterol, ondansetron **OR** ondansetron (ZOFRAN) IV  DVT prophylaxis: Lovenox Code Status: Full code Family Communication: Discussed with granddaughter and her husband at bedside Patient admitted from: home Anticipated d/c place: TBD Barriers to d/c: He is a bit more lethargic today, will continue to observe and discontinue Seroquel  Consultants:  None   Procedures:  None   Microbiology  None   Antimicrobials: Ceftriaxone / Azithromycin 4/27 >>    Objective: Vitals:   01/12/20 1900 01/12/20 2100 01/13/20 0855 01/13/20 1521  BP:  (!) 166/80 134/78 114/75  Pulse: 82 76  86  Resp: 16 17 18 18   Temp:  98  F (36.7 C) 98.1 F (36.7 C) 98.5 F (36.9 C)  TempSrc:  Axillary Oral   SpO2: 98% 95% 97% 96%  Weight:      Height:        Intake/Output  Summary (Last 24 hours) at 01/13/2020 1537 Last data filed at 01/13/2020 0510 Gross per 24 hour  Intake 100 ml  Output 900 ml  Net -800 ml   Filed Weights   01/10/20 1317  Weight: 65.8 kg    Examination:  Constitutional: No distress, lethargic but wakes up easily Eyes: No scleral icterus ENMT: mmm Neck: normal, supple Respiratory: Diminished at the bases, no wheezing, crackles, good air movement Cardiovascular: Regular rate and rhythm, no new murmurs, 3/6 SEM.  No edema Abdomen: Soft, nontender, nondistended, positive bowel sounds Musculoskeletal: no clubbing / cyanosis.  Skin: No rashes appreciated Neurologic: No focal deficits  Data Reviewed: I have independently reviewed following labs and imaging studies   CBC: Recent Labs  Lab 01/10/20 1323 01/10/20 1323 01/10/20 1333 01/11/20 0225 01/12/20 0548 01/12/20 0756 01/13/20 0318  WBC 15.5*  --   --  9.8 7.1  --  7.3  NEUTROABS 12.7*  --   --   --   --   --   --   HGB 12.5*  --  12.9* 12.9* 11.9*  --  12.6*  HCT 37.7*   < > 38.0* 38.3* 35.3* 34.6* 37.3*  MCV 89.5  --   --  88.5 88.7  --  87.8  PLT 129*  --   --  104* 114*  --  122*   < > = values in this interval not displayed.   Basic Metabolic Panel: Recent Labs  Lab 01/10/20 1323 01/10/20 1333 01/11/20 0225 01/12/20 0548 01/13/20 0318  NA 134* 134* 136 139 138  K 4.8 4.7 4.3 3.9 3.8  CL 100 98 103 105 105  CO2 24  --  15* 24 24  GLUCOSE 177* 171* 128* 132* 132*  BUN 40* 38* 30* 22 21  CREATININE 1.90* 1.80* 1.45* 1.24 1.11  CALCIUM 8.9  --  8.7* 8.6* 8.6*  MG  --   --   --   --  1.8  PHOS  --   --   --   --  3.1   Liver Function Tests: Recent Labs  Lab 01/10/20 1323 01/11/20 0628 01/12/20 0548 01/13/20 0318  AST 17 21 19 20   ALT 19 18 18 16   ALKPHOS 45 46 38 39  BILITOT 2.2* 2.0* 1.3* 1.0  PROT 6.6 6.0* 5.6* 5.8*  ALBUMIN 3.8 3.3* 3.0* 3.0*   Coagulation Profile: Recent Labs  Lab 01/10/20 1323  INR 1.3*   HbA1C: No results for  input(s): HGBA1C in the last 72 hours. CBG: Recent Labs  Lab 01/12/20 1720 01/12/20 2058 01/13/20 0816 01/13/20 1226 01/13/20 1519  GLUCAP 221* 123* 135* 205* 95    Recent Results (from the past 240 hour(s))  Respiratory Panel by RT PCR (Flu A&B, Covid) -     Status: None   Collection Time: 01/10/20  6:43 PM  Result Value Ref Range Status   SARS Coronavirus 2 by RT PCR NEGATIVE NEGATIVE Final    Comment: (NOTE) SARS-CoV-2 target nucleic acids are NOT DETECTED. The SARS-CoV-2 RNA is generally detectable in upper respiratoy specimens during the acute phase of infection. The lowest concentration of SARS-CoV-2 viral copies this assay can detect is 131 copies/mL. A negative result does not preclude SARS-Cov-2 infection and should not be used  as the sole basis for treatment or other patient management decisions. A negative result may occur with  improper specimen collection/handling, submission of specimen other than nasopharyngeal swab, presence of viral mutation(s) within the areas targeted by this assay, and inadequate number of viral copies (<131 copies/mL). A negative result must be combined with clinical observations, patient history, and epidemiological information. The expected result is Negative. Fact Sheet for Patients:  https://www.moore.com/ Fact Sheet for Healthcare Providers:  https://www.young.biz/ This test is not yet ap proved or cleared by the [place] FDA and  has been authorized for detection and/or diagnosis of SARS-CoV-2 by FDA under an Emergency Use Authorization (EUA). This EUA will remain  in effect (meaning this test can be used) for the duration of the COVID-19 declaration under Section 564(b)(1) of the Act, 21 U.S.C. section 360bbb-3(b)(1), unless the authorization is terminated or revoked sooner.    Influenza A by PCR NEGATIVE NEGATIVE Final   Influenza B by PCR NEGATIVE NEGATIVE Final    Comment:  (NOTE) The Xpert Xpress SARS-CoV-2/FLU/RSV assay is intended as an aid in  the diagnosis of influenza from Nasopharyngeal swab specimens and  should not be used as a sole basis for treatment. Nasal washings and  aspirates are unacceptable for Xpert Xpress SARS-CoV-2/FLU/RSV  testing. Fact Sheet for Patients: https://www.moore.com/ Fact Sheet for Healthcare Providers: https://www.young.biz/ This test is not yet approved or cleared by the [place] FDA and  has been authorized for detection and/or diagnosis of SARS-CoV-2 by  FDA under an Emergency Use Authorization (EUA). This EUA will remain  in effect (meaning this test can be used) for the duration of the  Covid-19 declaration under Section 564(b)(1) of the Act, 21  U.S.C. section 360bbb-3(b)(1), unless the authorization is  terminated or revoked. Performed at [facility], [address], [place], [place] [address]      Radiology Studies: No results found. Dean Pert, MD, PhD [facility]  Between 7 am - 7 pm I am available, please contact me via Amion or Securechat  Between 7 pm - 7 am I am not available, please contact night coverage MD/APP via Amion

## 2020-01-13 NOTE — Progress Notes (Signed)
Physical Therapy Treatment Patient Details Name: Dean Harris MRN: [id number] DOB: 01-23-1941 Today's Date: 01/13/2020    History of Present Illness Dean Harris is a 79 y.o. male with history of dementia, nonobstructive CAD, bilateral carotid endarterectomy, hyperlipidemia, moderate aortic stenosis chronic stable angina admitted with increased confusion, difficulty ambulating and epigastric pain. CT abdomen showed bilateral infiltrates concerning for pneumonia but nothing acute in the abdomen but showed features of chronic pancreatitis. Found to have underlying COPD     PT Comments    Pt demonstrating improved balance and was able to do 5 steps with assistance today.  Pt requiring increased time with max cues and encouragement due to dementia.  Pt with good strength and largely limited due to behavior/dementia.  Pt with possible plan to d/c to his home or granddaughters at d/c with 24 hr care.  Both houses have a few steps to enter that pt could perform with assist.  At his granddaughters house he has an additional flight of steps to bedroom and may benefit from hospital bed downstairs.  However, there is the possibility that pt with significant improvements in familiar environment due to dementia.  At this time advise pt going to his house with 24 hr care or granddaughters with 24 hr care and hospital bed.  Granddaughter is considering options.     Follow Up Recommendations  Home health PT;Supervision/Assistance - 24 hour     Equipment Recommendations  Wheelchair (measurements PT);Hospital bed;Rolling walker with 5" wheels    Recommendations for Other Services       Precautions / Restrictions Precautions Precautions: Fall    Mobility  Bed Mobility Overal bed mobility: Needs Assistance Bed Mobility: Supine to Sit;Sit to Supine     Supine to sit: Min assist Sit to supine: Min assist   General bed mobility comments: Increased cues and time to initiate  Transfers Overall  transfer level: Needs assistance   Transfers: Sit to/from Stand Sit to Stand: Min assist         General transfer comment: sit to stand x 6; increased time and facilitation for forward lean  Ambulation/Gait Ambulation/Gait assistance: Min assist Gait Distance (Feet): 100 Feet(x2) Assistive device: Rolling walker (2 wheeled);None Gait Pattern/deviations: Shuffle Gait velocity: slow   General Gait Details: Pt using RW 75% of time but would randomly leave it behind; required min A for balance at times; max cues and encouragment from PT and family   Stairs Stairs: Yes Stairs assistance: +2 safety/equipment;Min assist Stair Management: Two rails Number of Stairs: 5 General stair comments: pt performed 5 steps with min guard for safety; required max encouragement from family and PT; poor safety awareness   Wheelchair Mobility    Modified Rankin (Stroke Patients Only)       Balance Overall balance assessment: Needs assistance Sitting-balance support: Feet supported;No upper extremity supported Sitting balance-Leahy Scale: Good Sitting balance - Comments: Able to maintain EOB balance without assist today for at least 15 mins throughout session     Standing balance-Leahy Scale: Fair Standing balance comment: Required min guard today for safety                            Cognition Arousal/Alertness: Awake/alert Behavior During Therapy: WFL for tasks assessed/performed Overall Cognitive Status: Impaired/Different from baseline  General Comments: Pt does have dementia but is normally able to follow commands and communicate more than he is at this time.  Pt additionally becoming frustrated and stating "no leave me alone" with attempts to assist. Granddaughter was present and helped.      Exercises      General Comments General comments (skin integrity, edema, etc.): Discussed stairs with granddaughter.  Pt largely  limited by behavior/dementia in unfamiliar enviroment.  PT agrees that pt could do a few steps to get into house with rails and assist.  However, if returning to granddaughters house he has a flight.  Do not recommend a flight at this time.  If returning to her home recommend hospital bed.  Or could go to his home for a few days and see if improves in familiar environment.  She is considering options.      Pertinent Vitals/Pain Pain Assessment: No/denies pain    Home Living                      Prior Function            PT Goals (current goals can now be found in the care plan section) Progress towards PT goals: Progressing toward goals    Frequency    Min 3X/week      PT Plan Current plan remains appropriate    Co-evaluation              AM-PAC PT "6 Clicks" Mobility   Outcome Measure  Help needed turning from your back to your side while in a flat bed without using bedrails?: A Little Help needed moving from lying on your back to sitting on the side of a flat bed without using bedrails?: A Little Help needed moving to and from a bed to a chair (including a wheelchair)?: A Little Help needed standing up from a chair using your arms (e.g., wheelchair or bedside chair)?: A Little Help needed to walk in hospital room?: A Little Help needed climbing 3-5 steps with a railing? : A Little 6 Click Score: 18    End of Session Equipment Utilized During Treatment: Gait belt Activity Tolerance: Patient tolerated treatment well Patient left: in bed;with call bell/phone within reach;with bed alarm set Nurse Communication: Mobility status PT Visit Diagnosis: Unsteadiness on feet (R26.81);Muscle weakness (generalized) (M62.81);Difficulty in walking, not elsewhere classified (R26.2)     Time: 1345-1440 PT Time Calculation (min) (ACUTE ONLY): 55 min  Charges:  $Gait Training: 23-37 mins $Therapeutic Activity: 23-37 mins                     Maggie Font, PT Acute  Rehab Services Pager [phone] [facility] [phone] [facility] [phone]    Karlton Lemon 01/13/2020, 3:21 PM

## 2020-01-13 NOTE — Progress Notes (Signed)
[facility] notifed that patient became agitated when attempts made to place cardiac monitor on. Not compliant with Telesitter prompts to not remove them. Ilean Skill LPN

## 2020-01-13 NOTE — Plan of Care (Signed)
  Problem: Clinical Measurements: Goal: Respiratory complications will improve Outcome: Progressing   Problem: Activity: Goal: Risk for activity intolerance will decrease Outcome: Progressing   Problem: Nutrition: Goal: Adequate nutrition will be maintained Outcome: Progressing   Problem: Elimination: Goal: Will not experience complications related to bowel motility Outcome: Progressing Goal: Will not experience complications related to urinary retention Outcome: Progressing   Problem: Pain Managment: Goal: General experience of comfort will improve Outcome: Progressing   Problem: Safety: Goal: Ability to remain free from injury will improve Outcome: Progressing   Problem: Skin Integrity: Goal: Risk for impaired skin integrity will decrease Outcome: Progressing   

## 2020-01-13 NOTE — Progress Notes (Signed)
Tried to administer Medication and pt could not follow commands and inhale. Order for [facility] needs to be discontinued.

## 2020-01-13 NOTE — Progress Notes (Signed)
  Speech Language Pathology Treatment: Dysphagia  Patient Details Name: Dean Harris MRN: [id number] DOB: 1941-01-09 Today's Date: 01/13/2020 Time: 5883-2549 SLP Time Calculation (min) (ACUTE ONLY): 24 min  Assessment / Plan / Recommendation Clinical Impression  Pt seen for skilled therapy to upgrade diet if able with pt's granddaughter and husband who are caring for pt while wife is in rehab. Today he was sleepy but adequately awake for trials with regular texture. Mastication was functional and using lingual sweep to remove debris from teeth and cheek. There were no instances of cough or throat clear with single sips or as liquid wash with food in oral cavity. Family cuts pt's meats at home and recommend upgrading texture to Dys 3, pills whole in puree (may need crushed if pockets). Continue thin liquids. SLP educated family re: what is possible in the future in regards to oral intake and answered questions. Pt being discharged soon. ST will sign off.    HPI HPI: Dean Harris is a 79 y.o. male with history of dementia, nonobstructive CAD, bilateral carotid endarterectomy, hyperlipidemia, moderate aortic stenosis chronic stable angina admitted with increased confusion, difficulty ambulating and epigastric pain. CT abdomen showed bilateral infiltrates concerning for pneumonia but nothing acute in the abdomen but showed features of chronic pancreatitis. Found to have underlying COPD       SLP Plan  All goals met;Discharge SLP treatment due to (comment)       Recommendations  Diet recommendations: Dysphagia 3 (mechanical soft);Thin liquid Liquids provided via: Cup;Straw Medication Administration: Whole meds with puree Supervision: Patient able to self feed;Full supervision/cueing for compensatory strategies Compensations: Minimize environmental distractions;Slow rate;Small sips/bites;Lingual sweep for clearance of pocketing Postural Changes and/or Swallow Maneuvers: Seated upright 90  degrees                Oral Care Recommendations: Oral care BID Follow up Recommendations: 24 hour supervision/assistance SLP Visit Diagnosis: Dysphagia, unspecified (R13.10) Plan: All goals met;Discharge SLP treatment due to (comment)       GO                Houston Siren 01/13/2020, 4:41 PM

## 2020-01-14 LAB — GLUCOSE, CAPILLARY
Glucose-Capillary: 128 mg/dL — ABNORMAL HIGH (ref 70–99)
Glucose-Capillary: 153 mg/dL — ABNORMAL HIGH (ref 70–99)

## 2020-01-14 MED ORDER — AMOXICILLIN-POT CLAVULANATE 875-125 MG PO TABS
1.0000 | ORAL_TABLET | Freq: Two times a day (BID) | ORAL | 0 refills | Status: AC
Start: 2020-01-14 — End: 2020-01-18

## 2020-01-14 NOTE — Care Management (Addendum)
12:30 Patient accepted to [facility] for [facility] services 4/29.  Notified Liaison that he will DC today. Could not reach family to verify delivery of DME.  [place], Hamed Spouse [phone]    Shirlean Mylar   [phone]   12:45 Spoke w family over the phone, at bedside. [name] states that they do not need a hospital bed. They have all ordered DME and will resume w Chip Boer. No other CM needs identified.

## 2020-01-14 NOTE — Discharge Summary (Signed)
Physician Discharge Summary  Dean Harris QZR:[id number] DOB: 10-17-1940 DOA: 01/10/2020  PCP: Chevis Pretty, FNP  Admit date: 01/10/2020 Discharge date: 01/14/2020  Admitted From: home Disposition:  home  Recommendations for Outpatient Follow-up:  1. Follow up with PCP in 1-2 weeks  Home Health: PT Equipment/Devices: none  Discharge Condition: stable CODE STATUS: Full code Diet recommendation: dysphagia 3, thin liquids  HPI: Per admitting MD, Dean Harris is a 79 y.o. male with history of dementia, nonobstructive CAD, bilateral carotid endarterectomy, hyperlipidemia, moderate aortic stenosis chronic stable angina has been found to be increasingly confused and difficult to ambulate over the last 48 hours at home.  Today patient also complained of some epigastric pain and had some difficulty with moving bowels and patient had to strain.  After which patient became more weak and was brought to the ER.  Did not have any chest pain or shortness of breath or any productive cough. ED Course: In the ER labs show creatinine of 1.9 WBC 15.3 hemoglobin 12.5 platelets 129 blood glucose 177 sodium 134 total bilirubin was 2.2 with AST and ALT being normal.  CT head is unremarkable EKG shows normal sinus rhythm.  CT abdomen pelvis was done which showed bilateral infiltrates concerning for pneumonia but nothing acute in the abdomen did show any acute but showed features of chronic pancreatitis.  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  Patient was started on empiric antibiotic and admitted for further management.  At the time of my exam patient abdomen appears benign.  Denies any chest pain.  Covid test was negative.  Hospital Course / Discharge diagnoses: Principal Problem Bilateral lower lobe pneumonia-being treated as community-acquired pneumonia with ceftriaxone azithromycin, continue.  No reports of aspiration, speech therapy evaluated and recommended dysphagia 3 with thin  liquids.  Clinically improved, has remained on room air, his leukocytosis resolved.  He will be transitioned to Augmentin for 4 additional days to complete a week course of antibiotics  Active Problems Acute metabolic encephalopathy /slurred speech with underlying advanced dementia-likely in the setting of underlying infectious process, he underwent an MRI of the brain which did not show any active strokes however it did show significant atrophy.  Patient still has slurred speech this morning, have consulted neurology for further evaluation, appreciate input he does not appear to have significant neurologic process, he is improving with antibiotics.  With treatment for his pneumonia he has improved, returned to baseline per family, and will be discharged home in stable condition Isolated elevated bilirubin-appears to be improving, CT scan without any acute findings in the liver/gallbladder area.  Bilirubin improving, he is able to eat, no abdominal pain, no nausea or vomiting Elevated troponin-no chest pain, possibly representing demand.  He is followed by cardiology as an outpatient, has a history of nonocclusive CAD by cath however I do not see when the cath was done.  He is suspected to have some microvascular disease, continue Imdur Hypertension-resume home medications Type 2 diabetes mellitus-continue home medications Underlying COPD /asthma-stable, no wheezing, continue home medications Acute kidney injury on chronic kidney disease stage II-possibly due to p.o. intake, received IV fluids and creatinine has now normalized Anemia/Thrombocytopenia-appear to be chronic  Discharge Instructions   Allergies as of 01/14/2020      Reactions   Dust Mite Extract Other (See Comments)   Sneezing, coughing   Mold Extract [trichophyton] Other (See Comments)   Sneezing, coughing   Pollen Extract Other (See Comments)   Sneezing, coughing   Tetracyclines &  Related Other (See Comments)   Generic only       Medication List    TAKE these medications   Accu-Chek Aviva Plus test strip Generic drug: glucose blood Test blood sugar daily and as needed Dx E11.9   Accu-Chek Aviva Plus w/Device Kit Check blood sugar daily and as needed   Accu-Chek Softclix Lancets lancets CHECK BLOOD SUGAR DAILY AND AS NEEDED Dx E11.9   amoxicillin-clavulanate 875-125 MG tablet Commonly known as: Augmentin Take 1 tablet by mouth 2 (two) times daily for 4 days.   aspirin EC 81 MG tablet Take 1 tablet (81 mg total) by mouth daily.   b complex vitamins tablet Take 1 tablet by mouth daily.   busPIRone 5 MG tablet Commonly known as: BUSPAR Take 5 mg by mouth daily as needed. anxiety   donepezil 10 MG tablet Commonly known as: ARICEPT TAKE 1/2 (ONE-HALF) TABLET BY MOUTH AT BEDTIME (Needs to be seen before next refill)   Fluticasone-Salmeterol 100-50 MCG/DOSE Aepb Commonly known as: Advair Diskus Inhale 1 puff into the lungs every 12 (twelve) hours.   Ipratropium-Albuterol 20-100 MCG/ACT Aers respimat Commonly known as: COMBIVENT Inhale 1 puff into the lungs every 6 (six) hours.   isosorbide mononitrate 30 MG 24 hr tablet Commonly known as: IMDUR Take 1 tablet (30 mg total) by mouth daily.   lisinopril 20 MG tablet Commonly known as: ZESTRIL Take 1 tablet (20 mg total) by mouth daily.   memantine 10 MG tablet Commonly known as: NAMENDA Take 1 tablet (10 mg total) by mouth 2 (two) times daily.   nitroGLYCERIN 0.4 MG SL tablet Commonly known as: NITROSTAT Use 1 tabket under tongue at onset and may repeat every 5 minutes x2   sertraline 100 MG tablet Commonly known as: ZOLOFT Take 1 tablet (100 mg total) by mouth daily. What changed: how much to take   Travoprost (BAK Free) 0.004 % Soln ophthalmic solution Commonly known as: TRAVATAN Place 1 drop into both eyes at bedtime.            Durable Medical Equipment  (From admission, onward)         Start     Ordered   01/12/20 1619   For home use only DME wheelchair cushion (seat and back)  Once     01/12/20 1618   01/12/20 1615  For home use only DME lightweight manual wheelchair with seat cushion  Once    Comments: Patient suffers from dementia which impairs their ability to perform daily activities like dressing, bathing in the home.  A walker will not resolve  issue with performing activities of daily living. A wheelchair will allow patient to safely perform daily activities. Patient is not able to propel themselves in the home using a standard weight wheelchair due to weakness. Patient can self propel in the lightweight wheelchair. Length of need Lifetime. Accessories: elevating leg rests (ELRs), wheel locks, extensions and anti-tippers.   01/12/20 1616   01/12/20 1612  For home use only DME Walker rolling  Once    Question Answer Comment  Walker: With 5 Inch Wheels   Patient needs a walker to treat with the following condition Weakness      01/12/20 1614           Consultations:  Neurology   Procedures/Studies:  CT HEAD WO CONTRAST  Result Date: 01/10/2020 CLINICAL DATA:  Dementia, altered mental status greater than normal, walks with show full, history diabetes mellitus, hypertension, hyperlipidemia, coronary artery disease, former  smoker EXAM: CT HEAD WITHOUT CONTRAST TECHNIQUE: Contiguous axial images were obtained from the base of the skull through the vertex without intravenous contrast. Sagittal and coronal MPR images reconstructed from axial data set. COMPARISON:  11/08/2006 FINDINGS: Brain: Beam hardening artifacts of dental origin. Scattered motion artifacts, decreased on repeat imaging. Generalized atrophy. Normal ventricular morphology. No midline shift or mass effect. Small vessel chronic ischemic changes of deep cerebral white matter. No intracranial hemorrhage, mass lesion, or evidence of acute infarction. No extra-axial fluid collections. Vascular: Atherosclerotic calcification of internal carotid  and vertebral arteries at skull base. No definite hyperdense vessels. Skull: No definite osseous abnormalities within limitations of motion artifacts. Sinuses/Orbits: Visualized paranasal sinuses and mastoid air cells clear Other: N/A IMPRESSION: Atrophy with small vessel chronic ischemic changes of deep cerebral white matter. No definite intracranial abnormalities are identified on exam limited by patient motion, despite repeating images. Electronically Signed   By: Lavonia Dana M.D.   On: 01/10/2020 15:58   MR BRAIN WO CONTRAST  Result Date: 01/11/2020 CLINICAL DATA:  79 year old male with increasing confusion and difficulty walking. EXAM: MRI HEAD WITHOUT CONTRAST TECHNIQUE: Multiplanar, multiecho pulse sequences of the brain and surrounding structures were obtained without intravenous contrast. COMPARISON:  Head CT 01/10/2020. Brain MRI 10/10/2016, 11/08/2006. FINDINGS: Brain: No restricted diffusion to suggest acute infarction. No midline shift, mass effect, evidence of mass lesion, extra-axial collection or acute intracranial hemorrhage. Cervicomedullary junction and pituitary are within normal limits. Chronic generalized ventricular enlargement, mildly increased from 2018 in conjunction with similar increased generalized brain volume loss, and no transependymal edema. Atrophied bilateral mesial temporal lobes out of proportion to other cerebral volume loss (series 9, images 15 and 16). Subtle cortical encephalomalacia in the posterior right parietal lobe on series 6, image 15 is stable. No other cortical encephalomalacia. No chronic cerebral blood products identified. And otherwise gray and white matter signal is essentially normal for age (mild nonspecific white matter signal changes are stable since 2018. Vascular: Major intracranial vascular flow voids are stable since 2018. Dominant left vertebral artery. Skull and upper cervical spine: Negative for age visible cervical spine and marrow signal.  Sinuses/Orbits: Stable, negative. Other: Mastoids remain well pneumatized. Visible internal auditory structures appear normal. Scalp and face soft tissues appear negative. IMPRESSION: 1. No acute intracranial abnormality. 2. Chronic ventricular enlargement progressed since 2018 along with generalized cerebral volume loss. However, there is also chronic disproportionate atrophy of the mesial temporal lobes, also increased. 3. Minimal for age chronic ischemic changes, subtle chronic cortical encephalomalacia in the right parietal lobe. Electronically Signed   By: Genevie Ann M.D.   On: 01/11/2020 10:10   CT ABDOMEN PELVIS W CONTRAST  Result Date: 01/10/2020 CLINICAL DATA:  Diffuse abdominal pain with rebound tenderness. Dementia. EXAM: CT ABDOMEN AND PELVIS WITH CONTRAST TECHNIQUE: Multidetector CT imaging of the abdomen and pelvis was performed using the standard protocol following bolus administration of intravenous contrast. CONTRAST:  175m OMNIPAQUE IOHEXOL 300 MG/ML  SOLN COMPARISON:  Report from study 04/24/2003. FINDINGS: Lower chest: Patchy density in both lower lobes could be atelectasis or mild basilar pneumonia. No dense consolidation or lobar collapse. No pleural effusion. Hepatobiliary: Liver parenchyma is normal.  No calcified gallstones. Pancreas: Pancreatic atrophy and calcifications. No evidence of mass or pancreatitis. Spleen: Normal Adrenals/Urinary Tract: No adrenal mass. Left kidney is normal, with vascular calcifications. 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 That  situation persists, and there is also fullness of the renal collecting system of the lower pole. Ureters appear to join midportion. 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 I do not see an acutely obstructing phenomenon. Stomach/Bowel: No abnormality is seen  affecting the stomach or small intestine. There is a large amount of fecal matter in the rectosigmoid region. I do not see any evidence of acute bowel inflammation. Vascular/Lymphatic: Aortic atherosclerosis. No aneurysm. IVC is normal. No retroperitoneal adenopathy. Reproductive: Otherwise normal Other: No free fluid or air. Musculoskeletal: Ordinary lumbar degenerative changes. IMPRESSION: No abnormality seen to explain the acute abdomen presentation. I do not see any evidence of acute bowel pathology or other acute abdominal organ pathology. Bilateral lower lobe pulmonary infiltrates consistent with bilateral lower lobe pneumonia. 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 Chronic atrophic pancreatitis changes. No evidence of acute pancreatitis. Aortic Atherosclerosis (ICD10-I70.0). Electronically Signed   By: Nelson Chimes M.D.   On: 01/10/2020 16:25   DG Chest Port 1 View  Result Date: 01/10/2020 CLINICAL DATA:  Altered mental status EXAM: PORTABLE CHEST 1 VIEW COMPARISON:  01/17/2016 FINDINGS: The heart size and mediastinal contours are within normal limits. Atherosclerotic calcification of the thoracic aorta. No focal airspace consolidation, pleural effusion, or pneumothorax. The visualized skeletal structures are unremarkable. IMPRESSION: No active disease. Electronically Signed   By: Davina Poke D.O.   On: 01/10/2020 14:37      Subjective: - no chest pain, shortness of breath, no abdominal pain, nausea or vomiting.   Discharge Exam: BP (!) 174/68   Pulse 78   Temp 97.6 F (36.4 C)   Resp 19   Ht '5\' 2"'$  (1.575 m)   Wt 65.8 kg   SpO2 96%   BMI 26.52 kg/m   General: Pt is alert, awake, not in acute distress Cardiovascular: RRR, S1/S2 +, no rubs, no  gallops Respiratory: CTA bilaterally, no wheezing, no rhonchi Abdominal: Soft, NT, ND, bowel sounds + Extremities: no edema, no cyanosis    The results of significant diagnostics from this hospitalization (including imaging, microbiology, ancillary and laboratory) are listed below for reference.     Microbiology: Recent Results (from the past 240 hour(s))  Respiratory Panel by RT PCR (Flu A&B, Covid) -     Status: None   Collection Time: 01/10/20  6:43 PM  Result Value Ref Range Status   SARS Coronavirus 2 by RT PCR NEGATIVE NEGATIVE Final    Comment: (NOTE) SARS-CoV-2 target nucleic acids are NOT DETECTED. The SARS-CoV-2 RNA is generally detectable in upper respiratoy specimens during the acute phase of infection. The lowest concentration of SARS-CoV-2 viral copies this assay can detect is 131 copies/mL. A negative result does not preclude SARS-Cov-2 infection and should not be used as the sole basis for treatment or other patient management decisions. A negative result may occur with  improper specimen collection/handling, submission of specimen other than nasopharyngeal swab, presence of viral mutation(s) within the areas targeted by this assay, and inadequate number of viral copies (<131 copies/mL). A negative result must be combined with clinical observations, patient history, and epidemiological information. The expected result is Negative. Fact Sheet for Patients:  PinkCheek.be Fact Sheet for Healthcare Providers:  GravelBags.it This test is not yet ap proved or cleared  by the [place] and  has been authorized for detection and/or diagnosis of SARS-CoV-2 by FDA under an Emergency Use Authorization (EUA). This EUA will remain  in effect (meaning this test can be used) for the duration of the COVID-19 declaration under Section 564(b)(1) of the Act, 21 U.S.C. section 360bbb-3(b)(1), unless the authorization  is terminated or revoked sooner.    Influenza A by PCR NEGATIVE NEGATIVE Final   Influenza B by PCR NEGATIVE NEGATIVE Final    Comment: (NOTE) The Xpert Xpress SARS-CoV-2/FLU/RSV assay is intended as an aid in  the diagnosis of influenza from Nasopharyngeal swab specimens and  should not be used as a sole basis for treatment. Nasal washings and  aspirates are unacceptable for Xpert Xpress SARS-CoV-2/FLU/RSV  testing. Fact Sheet for Patients: PinkCheek.be Fact Sheet for Healthcare Providers: GravelBags.it This test is not yet approved or cleared by the [place] FDA and  has been authorized for detection and/or diagnosis of SARS-CoV-2 by  FDA under an Emergency Use Authorization (EUA). This EUA will remain  in effect (meaning this test can be used) for the duration of the  Covid-19 declaration under Section 564(b)(1) of the Act, 21  U.S.C. section 360bbb-3(b)(1), unless the authorization is  terminated or revoked. Performed at [facility], [place] [address] [place], [place] [address]      Labs: Basic Metabolic Panel: Recent Labs  Lab 01/10/20 1323 01/10/20 1333 01/11/20 0225 01/12/20 0548 01/13/20 0318  NA 134* 134* 136 139 138  K 4.8 4.7 4.3 3.9 3.8  CL 100 98 103 105 105  CO2 24  --  15* 24 24  GLUCOSE 177* 171* 128* 132* 132*  BUN 40* 38* 30* 22 21  CREATININE 1.90* 1.80* 1.45* 1.24 1.11  CALCIUM 8.9  --  8.7* 8.6* 8.6*  MG  --   --   --   --  1.8  PHOS  --   --   --   --  3.1   Liver Function Tests: Recent Labs  Lab 01/10/20 1323 01/11/20 0628 01/12/20 0548 01/13/20 0318  AST _0 ALT _1 ALKPHOS 45 46 38 39  BILITOT 2.2* 2.0* 1.3* 1.0  PROT 6.6 6.0* 5.6* 5.8*  ALBUMIN 3.8 3.3* 3.0* 3.0*   CBC: Recent Labs  Lab 01/10/20 1323 01/10/20 1323 01/10/20 1333 01/11/20 0225 01/12/20 0548 01/12/20 0756 01/13/20 0318  WBC 15.5*  --   --  9.8 7.1  --  7.3  NEUTROABS 12.7*   --   --   --   --   --   --   HGB 12.5*  --  12.9* 12.9* 11.9*  --  12.6*  HCT 37.7*   < > 38.0* 38.3* 35.3* 34.6* 37.3*  MCV 89.5  --   --  88.5 88.7  --  87.8  PLT 129*  --   --  104* 114*  --  122*   < > = values in this interval not displayed.   CBG: Recent Labs  Lab 01/13/20 1226 01/13/20 1519 01/13/20 2108 01/14/20 0725 01/14/20 1000  GLUCAP 205* 95 227* 128* 153*   Hgb A1c No results for input(s): HGBA1C in the last 72 hours. Lipid Profile No results for input(s): CHOL, HDL, LDLCALC, TRIG, CHOLHDL, LDLDIRECT in the last 72 hours. Thyroid function studies Recent Labs    01/12/20 0756  TSH 2.470   Urinalysis    Component Value Date/Time   COLORURINE YELLOW  01/10/2020 [place] 01/10/2020 1843   LABSPEC 1.024 01/10/2020 1843   PHURINE 5.0 01/10/2020 1843   GLUCOSEU NEGATIVE 01/10/2020 1843   HGBUR NEGATIVE 01/10/2020 1843   BILIRUBINUR NEGATIVE 01/10/2020 1843   KETONESUR NEGATIVE 01/10/2020 1843   PROTEINUR NEGATIVE 01/10/2020 1843   NITRITE NEGATIVE 01/10/2020 1843   LEUKOCYTESUR NEGATIVE 01/10/2020 1843    FURTHER DISCHARGE INSTRUCTIONS:   Get Medicines reviewed and adjusted: Please take all your medications with you for your next visit with your Primary MD   Laboratory/radiological data: Please request your Primary MD to go over all hospital tests and procedure/radiological results at the follow up, please ask your Primary MD to get all Hospital records sent to his/her office.   In some cases, they will be blood work, cultures and biopsy results pending at the time of your discharge. Please request that your primary care M.D. goes through all the records of your hospital data and follows up on these results.   Also Note the following: If you experience worsening of your admission symptoms, develop shortness of breath, life threatening emergency, suicidal or homicidal thoughts you must seek medical attention immediately by calling 911 or  calling your MD immediately  if symptoms less severe.   You must read complete instructions/literature along with all the possible adverse reactions/side effects for all the Medicines you take and that have been prescribed to you. Take any new Medicines after you have completely understood and accpet all the possible adverse reactions/side effects.    Do not drive when taking Pain medications or sleeping medications (Benzodaizepines)   Do not take more than prescribed Pain, Sleep and Anxiety Medications. It is not advisable to combine anxiety,sleep and pain medications without talking with your primary care practitioner   Special Instructions: If you have smoked or chewed Tobacco  in the last 2 yrs please stop smoking, stop any regular Alcohol  and or any Recreational drug use.   Wear Seat belts while driving.   Please note: You were cared for by a hospitalist during your hospital stay. Once you are discharged, your primary care physician will handle any further medical issues. Please note that NO REFILLS for any discharge medications will be authorized once you are discharged, as it is imperative that you return to your primary care physician (or establish a relationship with a primary care physician if you do not have one) for your post hospital discharge needs so that they can reassess your need for medications and monitor your lab values.  Time coordinating discharge: 40 minutes  SIGNED:  Marzetta Board, MD, PhD 01/14/2020, 11:23 AM

## 2020-01-14 NOTE — Discharge Instructions (Signed)
Follow with Bennie Pierini, FNP in 5-7 days  Please get a complete blood count and chemistry panel checked by your Primary MD at your next visit, and again as instructed by your Primary MD. Please get your medications reviewed and adjusted by your Primary MD.  Please request your Primary MD to go over all Hospital Tests and Procedure/Radiological results at the follow up, please get all Hospital records sent to your Prim MD by signing hospital release before you go home.  In some cases, there will be blood work, cultures and biopsy results pending at the time of your discharge. Please request that your primary care M.D. goes through all the records of your hospital data and follows up on these results.  If you had Pneumonia of Lung problems at the Hospital: Please get a 2 view Chest X ray done in 6-8 weeks after hospital discharge or sooner if instructed by your Primary MD.  If you have Congestive Heart Failure: Please call your Cardiologist or Primary MD anytime you have any of the following symptoms:  1) 3 pound weight gain in 24 hours or 5 pounds in 1 week  2) shortness of breath, with or without a dry hacking cough  3) swelling in the hands, feet or stomach  4) if you have to sleep on extra pillows at night in order to breathe  Follow cardiac low salt diet and 1.5 lit/day fluid restriction.  If you have diabetes Accuchecks 4 times/day, Once in AM empty stomach and then before each meal. Log in all results and show them to your primary doctor at your next visit. If any glucose reading is under 80 or above 300 call your primary MD immediately.  If you have Seizure/Convulsions/Epilepsy: Please do not drive, operate heavy machinery, participate in activities at heights or participate in high speed sports until you have seen by Primary MD or a Neurologist and advised to do so again. Per [facility] statutes, patients with seizures are not allowed to drive until they have been  seizure-free for six months.  Use caution when using heavy equipment or power tools. Avoid working on ladders or at heights. Take showers instead of baths. Ensure the water temperature is not too high on the home water heater. Do not go swimming alone. Do not lock yourself in a room alone (i.e. bathroom). When caring for infants or small children, sit down when holding, feeding, or changing them to minimize risk of injury to the child in the event you have a seizure. Maintain good sleep hygiene. Avoid alcohol.   If you had Gastrointestinal Bleeding: Please ask your Primary MD to check a complete blood count within one week of discharge or at your next visit. Your endoscopic/colonoscopic biopsies that are pending at the time of discharge, will also need to followed by your Primary MD.  Get Medicines reviewed and adjusted. Please take all your medications with you for your next visit with your Primary MD  Please request your Primary MD to go over all hospital tests and procedure/radiological results at the follow up, please ask your Primary MD to get all Hospital records sent to his/her office.  If you experience worsening of your admission symptoms, develop shortness of breath, life threatening emergency, suicidal or homicidal thoughts you must seek medical attention immediately by calling 911 or calling your MD immediately  if symptoms less severe.  You must read complete instructions/literature along with all the possible adverse reactions/side effects for all the Medicines you take  and that have been prescribed to you. Take any new Medicines after you have completely understood and accpet all the possible adverse reactions/side effects.   Do not drive or operate heavy machinery when taking Pain medications.   Do not take more than prescribed Pain, Sleep and Anxiety Medications  Special Instructions: If you have smoked or chewed Tobacco  in the last 2 yrs please stop smoking, stop any regular  Alcohol  and or any Recreational drug use.  Wear Seat belts while driving.  Please note You were cared for by a hospitalist during your hospital stay. If you have any questions about your discharge medications or the care you received while you were in the hospital after you are discharged, you can call the unit and asked to speak with the hospitalist on call if the hospitalist that took care of you is not available. Once you are discharged, your primary care physician will handle any further medical issues. Please note that NO REFILLS for any discharge medications will be authorized once you are discharged, as it is imperative that you return to your primary care physician (or establish a relationship with a primary care physician if you do not have one) for your aftercare needs so that they can reassess your need for medications and monitor your lab values.  You can reach the hospitalist office at phone [phone] or fax [phone]   If you do not have a primary care physician, you can call [phone] for a physician referral.  Activity: As tolerated with Full fall precautions use walker/cane & assistance as needed    Diet: soft  Disposition Home

## 2020-01-16 DIAGNOSIS — Z9189 Other specified personal risk factors, not elsewhere classified: Secondary | ICD-10-CM | POA: Diagnosis not present

## 2020-01-16 DIAGNOSIS — Z20828 Contact with and (suspected) exposure to other viral communicable diseases: Secondary | ICD-10-CM | POA: Diagnosis not present

## 2020-01-19 LAB — VITAMIN B1: Vitamin B1 (Thiamine): 133.7 nmol/L (ref 66.5–200.0)

## 2020-01-31 ENCOUNTER — Encounter: Payer: Self-pay | Admitting: Nurse Practitioner

## 2020-01-31 ENCOUNTER — Ambulatory Visit (INDEPENDENT_AMBULATORY_CARE_PROVIDER_SITE_OTHER): Payer: Medicare PPO | Admitting: Nurse Practitioner

## 2020-01-31 ENCOUNTER — Other Ambulatory Visit: Payer: Self-pay

## 2020-01-31 VITALS — BP 138/73 | HR 67 | Temp 97.3°F | Resp 20 | Ht 62.0 in | Wt 142.0 lb

## 2020-01-31 DIAGNOSIS — J189 Pneumonia, unspecified organism: Secondary | ICD-10-CM | POA: Diagnosis not present

## 2020-01-31 DIAGNOSIS — F0150 Vascular dementia without behavioral disturbance: Secondary | ICD-10-CM

## 2020-01-31 DIAGNOSIS — Z09 Encounter for follow-up examination after completed treatment for conditions other than malignant neoplasm: Secondary | ICD-10-CM

## 2020-01-31 NOTE — Patient Instructions (Signed)

## 2020-01-31 NOTE — Progress Notes (Signed)
Subjective:    Patient ID: Dean Harris, male    DOB: 1941-07-25, 79 y.o.   MRN: [id number]   Chief Complaint: Hospitalization Follow-up   HPI Patient was admitted to hospital on 01/10/20 with altered mental status. He has a history of dementia, but had become more confused and was having trouble ambulating. He was dx with pneumonia bilaterally. He was discharged home on augmentin. He is currently under the care of his granddaughter and her husband In their home. Grandson in law says he Is some better mentally. He has been using walker in the home. They are taking [facility] to [facility]. They feel he I some better. He recognizes people he ee frequently.   Review of Systems  Constitutional: Negative for diaphoresis.  Eyes: Negative for pain.  Respiratory: Negative for shortness of breath.   Cardiovascular: Negative for chest pain, palpitations and leg swelling.  Gastrointestinal: Negative for abdominal pain.  Endocrine: Negative for polydipsia.  Skin: Negative for rash.  Neurological: Negative for dizziness, weakness and headaches.  Hematological: Does not bruise/bleed easily.  All other systems reviewed and are negative.      Objective:   Physical Exam Vitals and nursing note reviewed.  Constitutional:      Appearance: Normal appearance. He is well-developed.  HENT:     Head: Normocephalic.     Nose: Nose normal.  Eyes:     Pupils: Pupils are equal, round, and reactive to light.  Neck:     Thyroid: No thyroid mass or thyromegaly.     Vascular: No carotid bruit or JVD.     Trachea: Phonation normal.  Cardiovascular:     Rate and Rhythm: Normal rate and regular rhythm.  Pulmonary:     Effort: Pulmonary effort is normal. No respiratory distress.     Breath sounds: Normal breath sounds.  Abdominal:     General: Bowel sounds are normal.     Palpations: Abdomen is soft.     Tenderness: There is no abdominal tenderness.  Musculoskeletal:        General:  Normal range of motion.     Cervical back: Normal range of motion and neck supple.  Lymphadenopathy:     Cervical: No cervical adenopathy.  Skin:    General: Skin is warm and dry.  Neurological:     Mental Status: He is alert. He is disoriented.  Psychiatric:        Behavior: Behavior normal.        Thought Content: Thought content normal.        Judgment: Judgment normal.    Blood pressure 138/73, pulse 67, temperature (!) 97.3 F (36.3 C), temperature source Temporal, resp. rate 20, height 5\' 2"  (1.575 m), weight 142 lb (64.4 kg), SpO2 96 %.       Assessment & Plan:  JEWEL VENDITTO in today with chief complaint of Hospitalization Follow-up   1. Pneumonia of both lungs due to infectious organism, unspecified part of lung  weekNeed to repeat chest xray in  - 2. Vascular dementia without behavioral disturbance (HCC) [facility] for the social interaction  3. Hospital discharge follow-up Hospital records reviewed    The above assessment and management plan was discussed with the patient. The patient verbalized understanding of and has agreed to the management plan. Patient is aware to call the clinic if symptoms persist or worsen. Patient is aware when to return to the clinic for a follow-up visit. Patient educated on when  it is appropriate to go to the emergency department.   Mary-Margaret Hassell Done, FNP

## 2020-02-12 DIAGNOSIS — R4182 Altered mental status, unspecified: Secondary | ICD-10-CM | POA: Diagnosis not present

## 2020-02-12 DIAGNOSIS — J189 Pneumonia, unspecified organism: Secondary | ICD-10-CM | POA: Diagnosis not present

## 2020-02-12 DIAGNOSIS — R2681 Unsteadiness on feet: Secondary | ICD-10-CM | POA: Diagnosis not present

## 2020-02-12 DIAGNOSIS — R262 Difficulty in walking, not elsewhere classified: Secondary | ICD-10-CM | POA: Diagnosis not present

## 2020-02-15 ENCOUNTER — Other Ambulatory Visit: Payer: Self-pay

## 2020-02-15 MED ORDER — NITROGLYCERIN 0.4 MG SL SUBL: SUBLINGUAL_TABLET | SUBLINGUAL | 5 refills | Status: AC

## 2020-02-16 ENCOUNTER — Ambulatory Visit (INDEPENDENT_AMBULATORY_CARE_PROVIDER_SITE_OTHER): Payer: Medicare PPO

## 2020-02-16 ENCOUNTER — Ambulatory Visit (HOSPITAL_COMMUNITY)
Admission: EM | Admit: 2020-02-16 | Discharge: 2020-02-16 | Disposition: A | Discharge status: Home / Self Care | Payer: Medicare PPO | Attending: Urgent Care | Admitting: Urgent Care

## 2020-02-16 ENCOUNTER — Other Ambulatory Visit: Payer: Self-pay

## 2020-02-16 ENCOUNTER — Encounter (HOSPITAL_COMMUNITY): Payer: Self-pay | Admitting: Emergency Medicine

## 2020-02-16 DIAGNOSIS — R05 Cough: Secondary | ICD-10-CM | POA: Diagnosis not present

## 2020-02-16 DIAGNOSIS — Z8701 Personal history of pneumonia (recurrent): Secondary | ICD-10-CM

## 2020-02-16 DIAGNOSIS — R0602 Shortness of breath: Secondary | ICD-10-CM

## 2020-02-16 DIAGNOSIS — J449 Chronic obstructive pulmonary disease, unspecified: Secondary | ICD-10-CM

## 2020-02-16 DIAGNOSIS — R059 Cough, unspecified: Secondary | ICD-10-CM

## 2020-02-16 DIAGNOSIS — R062 Wheezing: Secondary | ICD-10-CM

## 2020-02-16 MED ORDER — PREDNISONE 20 MG PO TABS
ORAL_TABLET | ORAL | 0 refills | Status: DC
Start: 2020-02-16 — End: 2020-03-22

## 2020-02-16 NOTE — ED Provider Notes (Signed)
Dean Harris   MRN: [id number] DOB: 05-21-1941  Subjective:   Dean Harris is a 79 y.o. male with pmh of COPD, recent pneumonia, CAD presenting for 1 month history of persistent cough, lethargy.  Was seen and admitted at the end of April 2021 for bilateral pulmonary infiltrates seen on CT abdomen.  During his admission he was treated with ceftriaxone and azithromycin.  He was discharged with Augmentin. Was seen for f/u with [facility] 01/31/2020, no further intervention was done.  No current facility-administered medications for this encounter.  Current Outpatient Medications:  .  Accu-Chek Softclix Lancets lancets, CHECK BLOOD SUGAR DAILY AND AS NEEDED Dx E11.9, Disp: 100 each, Rfl: 3 .  aspirin EC 81 MG tablet, Take 1 tablet (81 mg total) by mouth daily., Disp: 90 tablet, Rfl: 3 .  b complex vitamins tablet, Take 1 tablet by mouth daily., Disp: , Rfl:  .  Blood Glucose Monitoring Suppl (ACCU-CHEK AVIVA PLUS) w/Device KIT, Check blood sugar daily and as needed, Disp: 1 kit, Rfl: 0 .  busPIRone (BUSPAR) 5 MG tablet, Take 5 mg by mouth daily as needed. anxiety, Disp: , Rfl:  .  donepezil (ARICEPT) 10 MG tablet, TAKE 1/2 (ONE-HALF) TABLET BY MOUTH AT BEDTIME (Needs to be seen before next refill), Disp: 45 tablet, Rfl: 1 .  Fluticasone-Salmeterol (ADVAIR DISKUS) 100-50 MCG/DOSE AEPB, Inhale 1 puff into the lungs every 12 (twelve) hours., Disp: 180 each, Rfl: 1 .  glucose blood (ACCU-CHEK AVIVA PLUS) test strip, Test blood sugar daily and as needed Dx E11.9, Disp: 100 each, Rfl: 3 .  Ipratropium-Albuterol (COMBIVENT) 20-100 MCG/ACT AERS respimat, Inhale 1 puff into the lungs every 6 (six) hours., Disp: 12 g, Rfl: 1 .  isosorbide mononitrate (IMDUR) 30 MG 24 hr tablet, Take 1 tablet (30 mg total) by mouth daily., Disp: 90 tablet, Rfl: 1 .  lisinopril (ZESTRIL) 20 MG tablet, Take 1 tablet (20 mg total) by mouth daily., Disp: 90 tablet, Rfl: 1 .  memantine (NAMENDA) 10  MG tablet, Take 1 tablet (10 mg total) by mouth 2 (two) times daily., Disp: 180 tablet, Rfl: 1 .  nitroGLYCERIN (NITROSTAT) 0.4 MG SL tablet, Use 1 tabket under tongue at onset and may repeat every 5 minutes x2, Disp: 25 tablet, Rfl: 5 .  sertraline (ZOLOFT) 100 MG tablet, Take 1 tablet (100 mg total) by mouth daily. (Patient taking differently: Take 150 mg by mouth daily. ), Disp: 90 tablet, Rfl: 1 .  Travoprost, BAK Free, (TRAVATAN) 0.004 % SOLN ophthalmic solution, Place 1 drop into both eyes at bedtime., Disp: , Rfl:    Allergies  Allergen Reactions  . Dust Mite Extract Other (See Comments)    Sneezing, coughing  . Mold Extract [Trichophyton] Other (See Comments)    Sneezing, coughing  . Pollen Extract Other (See Comments)    Sneezing, coughing  . Tetracyclines & Related Other (See Comments)    Generic only    Past Medical History:  Diagnosis Date  . Chronic stable angina ([place])   . 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 Depression   . Diabetes mellitus   . Essential hypertension   . Hyperlipidemia with target LDL less than 70   . Memory difficulties   . 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 Pneumonia    x2  .  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 This has helped his memory issues.  . Thrombocytopenia ([place]) 04/29/2014  . Vitamin D deficiency      Past Surgical History:  Procedure Laterality Date  . CAROTID ENDARTERECTOMY    . LEFT HEART CATHETERIZATION WITH CORONARY ANGIOGRAM N/A 02/13/2012   Procedure: LEFT HEART CATHETERIZATION WITH CORONARY ANGIOGRAM;  Surgeon: Candee Furbish, MD;  Location: [facility] CATH LAB;  Service: Cardiovascular: Diffuse mid LAD 50%, 50%pOM1, 50%mRCA -> medical management  . TRANSTHORACIC ECHOCARDIOGRAM   01/2012   EF 55-60%. Mild LVH. No RWMA, mild Aortic Stenosis (mean/peak gradient 13 mmHg/21 mmHg)  . TRANSTHORACIC ECHOCARDIOGRAM  May 2017, May 2019   A) EF 60-65% w/o RWMA. Normal DF for age. Mod AD (mean/peak gradient 24/35 mmHg); b) EF 60-65%, mod LVH, Mod AS (Mean gradient (S): 28 mm Hg. Peak 43 mmHg  . US CAROTID DOPPLER BILATERAL ([facility] HX) Bilateral 02/05/2016   Stable mild-to-moderate disease with bilateral heterogeneous plaque. RICA - 4-48%, LICA 18-56%. Bilateral subclavian and vertebral arteries normal.    Family History  Problem Relation Age of Onset  . Diabetes Mother   . Stroke Mother   . Hypertension Mother   . Hyperlipidemia Mother   . Cancer Father   . Hypertension Brother   . Cancer Other     Social History   Tobacco Use  . Smoking status: Former Smoker    Types: Cigarettes    Quit date: 02/10/1988    Years since quitting: 32.0  . Smokeless tobacco: Never Used  Substance Use Topics  . Alcohol use: No  . Drug use: No    ROS   Objective:   Vitals: BP (!) 97/59 (BP Location: Left Arm)   Pulse 71   Temp 98.5 F (36.9 C) (Oral)   Resp 14   SpO2 96%   BP was 128/43 on recheck by PA-Baylin Cabal.   Physical Exam Constitutional:      General: He is not in acute distress.    Appearance: Normal appearance. He is well-developed. He is not ill-appearing, toxic-appearing or diaphoretic.  HENT:     Head: Normocephalic and atraumatic.     Right Ear: External ear normal.     Left Ear: External ear normal.     Nose: Nose normal.     Mouth/Throat:     Mouth: Mucous membranes are moist.     Pharynx: Oropharynx is clear.  Eyes:     General: No scleral icterus.       Right eye: No discharge.        Left eye: No discharge.     Extraocular Movements: Extraocular movements intact.     Conjunctiva/sclera: Conjunctivae normal.     Pupils: Pupils are equal, round, and reactive to light.  Cardiovascular:     Rate and Rhythm: Normal rate and regular rhythm.     Heart  sounds: Normal heart sounds. No murmur. No friction rub. No gallop.   Pulmonary:     Effort: Pulmonary effort is normal. No respiratory distress.     Breath sounds: No stridor. No wheezing, rhonchi or rales.     Comments: Decreased breath sounds likely due to effort given cognitive deficits.  Skin:    General: Skin is warm and dry.  Neurological:     Mental Status: He is alert and oriented to person, place, and time.  Psychiatric:        Mood and Affect: Mood normal.  Behavior: Behavior normal.        Thought Content: Thought content normal.        Judgment: Judgment normal.     DG Chest 2 View  Result Date: 02/16/2020 CLINICAL DATA:  Cough, short of breath EXAM: CHEST - 2 VIEW COMPARISON:  01/10/2020 FINDINGS: Frontal and lateral views of the chest demonstrate a stable cardiac silhouette. No airspace disease, effusion, or pneumothorax. No acute bony abnormalities. IMPRESSION: 1. No acute intrathoracic process. Electronically Signed   By: Randa Ngo M.D.   On: 02/16/2020 20:11    Assessment and Plan :   PDMP not reviewed this encounter.  1. Cough   2. Chronic obstructive pulmonary disease, unspecified COPD type ([place])   3. History of pneumonia   4. Wheezing   5. Shortness of breath     Negative chest x-ray.  Blood pressure readings for diastolic were low but systolic was fine.  Discussed with patient's granddaughter treatment plan at length including maintaining his inhalers and scheduling his albuterol ipratropium.  We will use a steroid course to manage for COPD given his x-ray findings.  Strict ER precautions. Counseled patient on potential for adverse effects with medications prescribed today, patient verbalized understanding.    Jaynee Eagles, [place] 02/17/20 [id number]

## 2020-02-16 NOTE — ED Triage Notes (Signed)
Pts granddaughter brings him in for productive cough, hx of pneumonia about a month ago. She states he has been wheezing and seems lethargic. Granddaughter states his appetite has been poor as well.

## 2020-02-16 NOTE — Discharge Instructions (Addendum)
Use his albuterol-ipratropium (Combivent) inhaler 3 times daily 4-6 hours apart. Keep using Advair Diskus. I will add a steroid course for 5 days only. If his symptoms worsen including more confusion from his baseline, worsening wheezing, fast breathing, lethargy, bloody phlegm then report to the ER immediately. If his blood pressure drops below 90 systolic (top number) and/or 60 diastolic (bottom number) then please take him to the ER. You can check his blood pressure twice daily for the next 3 days.

## 2020-02-20 ENCOUNTER — Telehealth: Payer: Self-pay | Admitting: Nurse Practitioner

## 2020-02-20 NOTE — Telephone Encounter (Signed)
  Prescription Request  02/20/2020  What is the name of the medication or equipment? Memantine  Have you contacted your pharmacy to request a refill? (if applicable) Yes  Which pharmacy would you like this sent to? [facility], in [place] on [address]  Patients granddaughter sent this message: Dean Harris has a prescription for Memantine that he takes twice a day. The doctor's order the [facility] put in for [facility] was for this prescription. [name] takes this medicine at lunch time and bedtime. Since he is at [facility] during lunchtime, the nurse there gives it to him. I gave them the bottle of pills that we had on hand when he started (about a month ago). We have 1 pill left for Saturday night, so I need a refill please, so he can take it at home at bedtime. His pharmacy that we use is the [facility] on [address] in [place].   Patient notified that their request is being sent to the clinical staff for review and that they should receive a response within 2 business days.

## 2020-02-21 ENCOUNTER — Other Ambulatory Visit: Payer: Self-pay | Admitting: Nurse Practitioner

## 2020-02-21 DIAGNOSIS — R413 Other amnesia: Secondary | ICD-10-CM

## 2020-02-21 MED ORDER — MEMANTINE HCL 10 MG PO TABS: 10.0000 mg | ORAL_TABLET | Freq: Two times a day (BID) | ORAL | 1 refills | Status: DC

## 2020-02-21 NOTE — Progress Notes (Signed)
namenda rx sent to pharmacy

## 2020-02-22 ENCOUNTER — Telehealth: Payer: Self-pay | Admitting: Nurse Practitioner

## 2020-02-22 NOTE — Telephone Encounter (Signed)
He should be back to his normal self within a week. There is nothing we can give him, it just needs to get out of his system.

## 2020-02-22 NOTE — Telephone Encounter (Signed)
Spoke with patients grandchild and she asked to send to provider covering instead of waiting for PCP to come back. When he comes back home to stay with her he can show he is a little upset but nothing like he is a at daycare. But he is having big outburst. At home and adult program. Covering PCP please advise.

## 2020-02-22 NOTE — Telephone Encounter (Signed)
[facility] and told them they had to come pick him up. He started get aggressive towards other members and throwing potted plants. He seem to keep getting worse. Could there be some medication we could send in that could calm him down. What are their options? Please avdise

## 2020-02-22 NOTE — Telephone Encounter (Signed)
Aware of provider's advice. 

## 2020-02-22 NOTE — Telephone Encounter (Signed)
Patients granddaughter sent this message.  I was talking with Dean Harris adult day care today and checking in on how he was doing since hes been getting upset lately and angry. I remembered that on 6/3 we went to [facility] because we saw signs of his pneumonia coming back. We wanted to make sure he wasnt getting sick.   The PA there prescribed Dan predniSONE and he took the last two pills today 6/7. I think he might be having an adverse effect from this medicine resulting in anger and outbursts throughout the day. When he naps in the evening time he is cursing while dreaming and saying not so nice things.   Do dementia patients have side effects from this medicine? Do we know how the medicine will take to work out of his system?   (Patient has video visit scheduled with MMM on 02/27/20 for mood/behavior issues)

## 2020-02-27 ENCOUNTER — Encounter: Payer: Self-pay | Admitting: Nurse Practitioner

## 2020-02-27 ENCOUNTER — Telehealth (INDEPENDENT_AMBULATORY_CARE_PROVIDER_SITE_OTHER): Payer: Medicare PPO | Admitting: Nurse Practitioner

## 2020-02-27 DIAGNOSIS — F419 Anxiety disorder, unspecified: Secondary | ICD-10-CM

## 2020-02-27 MED ORDER — CLONAZEPAM 0.5 MG PO TABS: 0.5000 mg | ORAL_TABLET | Freq: Two times a day (BID) | ORAL | 1 refills | Status: DC | PRN

## 2020-02-27 NOTE — Progress Notes (Signed)
Virtual Visit via video Note   Due to COVID-19 pandemic this visit was conducted virtually. This visit type was conducted due to national recommendations for restrictions regarding the COVID-19 Pandemic (e.g. social distancing, sheltering in place) in an effort to limit this patient's exposure and mitigate transmission in our community. All issues noted in this document were discussed and addressed.  A physical exam was not performed with this format.  I connected with  Dean Harris  on 02/27/20 at 4:45 by video and verified that I am speaking with the correct person using two identifiers. Dean Harris is currently located at home and granddaughtr is currently with him during visit. The provider, Mary-Margaret Hassell Done, FNP is located in their office at time of visit.  I discussed the limitations, risks, security and privacy concerns of performing an evaluation and management service by telephone and the availability of in person appointments. I also discussed with the patient that there may be a patient responsible charge related to this service. The patient expressed understanding and agreed to proceed.   History and Present Illness:   Chief Complaint: anxiety  HPI Patient is currently living with his granddaughter in law. They are taking care of him because his wife had a fall had developed a head injury. Since living with them she has noticed a gradual decline cognitively. He no longer recognizes his wife. He has been going to an adult day care but he has been having anger outburst and becoming almost violent. She says that she can never tell how he is going to react to things.  He has rx for buspar and that make sno difference what so ever. She says she needs some help at this point. She does not want to put him in a home.   Review of Systems  Constitutional: Negative for diaphoresis and weight loss.  Eyes: Negative for blurred vision, double vision and pain.  Respiratory: Negative  for shortness of breath.   Cardiovascular: Negative for chest pain, palpitations, orthopnea and leg swelling.  Gastrointestinal: Negative for abdominal pain.  Skin: Negative for rash.  Neurological: Negative for dizziness, sensory change, loss of consciousness, weakness and headaches.  Endo/Heme/Allergies: Negative for polydipsia. Does not bruise/bleed easily.  Psychiatric/Behavioral: Positive for hallucinations. Negative for memory loss. The patient is nervous/anxious. The patient does not have insomnia.   All other systems reviewed and are negative.      Observations/Objective: patientt is not oriented to person place and time. Mainly talked with grand daughtr in law.  Assessment and Plan: Dean Harris in today with chief complaint of No chief complaint on file.   1. Anxiety Side effects of meds discussed Continue to talk calmly as doing to not escalate situation Call me if meds do not help - clonazePAM (KLONOPIN) 0.5 MG tablet; Take 1 tablet (0.5 mg total) by mouth 2 (two) times daily as needed for anxiety.  Dispense: 60 tablet; Refill: 1     Follow Up Instructions: prn    I discussed the assessment and treatment plan with the patient. The patient was provided an opportunity to ask questions and all were answered. The patient agreed with the plan and demonstrated an understanding of the instructions.   The patient was advised to call back or seek an in-person evaluation if the symptoms worsen or if the condition fails to improve as anticipated.  The above assessment and management plan was discussed with the patient. The patient verbalized understanding of and has agreed to the  management plan. Patient is aware to call the clinic if symptoms persist or worsen. Patient is aware when to return to the clinic for a follow-up visit. Patient educated on when it is appropriate to go to the emergency department.   Time call ended:5:05  I provided 20 minutes of face-to-face time  during this encounter.    [name], FNP

## 2020-03-01 ENCOUNTER — Telehealth: Payer: Self-pay | Admitting: Nurse Practitioner

## 2020-03-01 NOTE — Telephone Encounter (Signed)
Advised grand daughter to stop klonopin and restart buspar. She states that they were only giving him 1/2 tab of buspar and it wasn't effective so would try a whole pill starting tomorrow. Advised grand daughter to let us know how the meds worked. She verbalized understanding

## 2020-03-01 NOTE — Telephone Encounter (Signed)
Pts granddaughter states they started the clonazePAM (KLONOPIN) 0.5 MG tablet Wednesday night with a full pill and pt was so sleepy and could not walk. Today he took a half tablet and fell at his adult daycare. She states the medication is making him very fatigue and not mobile. She is wanting to know if the medication needs to be discontinued.

## 2020-03-12 ENCOUNTER — Ambulatory Visit: Payer: Medicare PPO | Admitting: Podiatry

## 2020-03-12 ENCOUNTER — Encounter: Payer: Self-pay | Admitting: Podiatry

## 2020-03-12 ENCOUNTER — Other Ambulatory Visit: Payer: Self-pay

## 2020-03-12 DIAGNOSIS — B351 Tinea unguium: Secondary | ICD-10-CM | POA: Diagnosis not present

## 2020-03-12 DIAGNOSIS — M79675 Pain in left toe(s): Secondary | ICD-10-CM | POA: Diagnosis not present

## 2020-03-12 DIAGNOSIS — M79674 Pain in right toe(s): Secondary | ICD-10-CM

## 2020-03-12 DIAGNOSIS — M2012 Hallux valgus (acquired), left foot: Secondary | ICD-10-CM

## 2020-03-12 DIAGNOSIS — E1142 Type 2 diabetes mellitus with diabetic polyneuropathy: Secondary | ICD-10-CM | POA: Diagnosis not present

## 2020-03-12 DIAGNOSIS — M2011 Hallux valgus (acquired), right foot: Secondary | ICD-10-CM

## 2020-03-12 NOTE — Patient Instructions (Signed)
Diabetes Mellitus and Foot Care Foot care is an important part of your health, especially when you have diabetes. Diabetes may cause you to have problems because of poor blood flow (circulation) to your feet and legs, which can cause your skin to:  Become thinner and drier.  Break more easily.  Heal more slowly.  Peel and crack. You may also have nerve damage (neuropathy) in your legs and feet, causing decreased feeling in them. This means that you may not notice minor injuries to your feet that could lead to more serious problems. Noticing and addressing any potential problems early is the best way to prevent future foot problems. How to care for your feet Foot hygiene  Wash your feet daily with warm water and mild soap. Do not use hot water. Then, pat your feet and the areas between your toes until they are completely dry. Do not soak your feet as this can dry your skin.  Trim your toenails straight across. Do not dig under them or around the cuticle. File the edges of your nails with an emery board or nail file.  Apply a moisturizing lotion or petroleum jelly to the skin on your feet and to dry, brittle toenails. Use lotion that does not contain alcohol and is unscented. Do not apply lotion between your toes. Shoes and socks  Wear clean socks or stockings every day. Make sure they are not too tight. Do not wear knee-high stockings since they may decrease blood flow to your legs.  Wear shoes that fit properly and have enough cushioning. Always look in your shoes before you put them on to be sure there are no objects inside.  To break in new shoes, wear them for just a few hours a day. This prevents injuries on your feet. Wounds, scrapes, corns, and calluses  Check your feet daily for blisters, cuts, bruises, sores, and redness. If you cannot see the bottom of your feet, use a mirror or ask someone for help.  Do not cut corns or calluses or try to remove them with medicine.  If you  find a minor scrape, cut, or break in the skin on your feet, keep it and the skin around it clean and dry. You may clean these areas with mild soap and water. Do not clean the area with peroxide, alcohol, or iodine.  If you have a wound, scrape, corn, or callus on your foot, look at it several times a day to make sure it is healing and not infected. Check for: ? Redness, swelling, or pain. ? Fluid or blood. ? Warmth. ? Pus or a bad smell. General instructions  Do not cross your legs. This may decrease blood flow to your feet.  Do not use heating pads or hot water bottles on your feet. They may burn your skin. If you have lost feeling in your feet or legs, you may not know this is happening until it is too late.  Protect your feet from hot and cold by wearing shoes, such as at the beach or on hot pavement.  Schedule a complete foot exam at least once a year (annually) or more often if you have foot problems. If you have foot problems, report any cuts, sores, or bruises to your health care provider immediately. Contact a health care provider if:  You have a medical condition that increases your risk of infection and you have any cuts, sores, or bruises on your feet.  You have an injury that is not   healing.  You have redness on your legs or feet.  You feel burning or tingling in your legs or feet.  You have pain or cramps in your legs and feet.  Your legs or feet are numb.  Your feet always feel cold.  You have pain around a toenail. Get help right away if:  You have a wound, scrape, corn, or callus on your foot and: ? You have pain, swelling, or redness that gets worse. ? You have fluid or blood coming from the wound, scrape, corn, or callus. ? Your wound, scrape, corn, or callus feels warm to the touch. ? You have pus or a bad smell coming from the wound, scrape, corn, or callus. ? You have a fever. ? You have a red line going up your leg. Summary  Check your feet every day  for cuts, sores, red spots, swelling, and blisters.  Moisturize feet and legs daily.  Wear shoes that fit properly and have enough cushioning.  If you have foot problems, report any cuts, sores, or bruises to your health care provider immediately.  Schedule a complete foot exam at least once a year (annually) or more often if you have foot problems. This information is not intended to replace advice given to you by your health care provider. Make sure you discuss any questions you have with your health care provider. Document Revised: 05/25/2019 Document Reviewed: 10/03/2016 Elsevier Patient Education  2020 [employer].  

## 2020-03-13 ENCOUNTER — Telehealth: Payer: Self-pay | Admitting: Nurse Practitioner

## 2020-03-13 NOTE — Telephone Encounter (Signed)
Spoke to [place] and they have found a facility with room and can take him. [facility] [address], [place] [address] ph # [phone] and [name] is the contact. Will start the [employer] [facility] [employer] form for provider to sign.

## 2020-03-13 NOTE — Telephone Encounter (Signed)
FYI: Patients granddaughter wanted to make MMM aware that pt's psychologist at the [facility] changed 2 of pt's meds. She has increased the Buspirone to twice a day at 5MG  and changed the Sertraline from 1.5 pills a day to 2 total.

## 2020-03-13 NOTE — Telephone Encounter (Signed)
Received email from patients granddaughter requesting that an FL2 Form be filled out for patient because they are looking at memory care facilities for pt. Please call granddaughter when form is ready.

## 2020-03-14 DIAGNOSIS — R4182 Altered mental status, unspecified: Secondary | ICD-10-CM | POA: Diagnosis not present

## 2020-03-14 DIAGNOSIS — R262 Difficulty in walking, not elsewhere classified: Secondary | ICD-10-CM | POA: Diagnosis not present

## 2020-03-14 DIAGNOSIS — J189 Pneumonia, unspecified organism: Secondary | ICD-10-CM | POA: Diagnosis not present

## 2020-03-14 DIAGNOSIS — R2681 Unsteadiness on feet: Secondary | ICD-10-CM | POA: Diagnosis not present

## 2020-03-14 NOTE — Telephone Encounter (Signed)
In your last note you had said they weren't ready to put him in a home. If I fill the FL2 form out will you sign it or does he ntbs?

## 2020-03-15 NOTE — Telephone Encounter (Signed)
Yes I will sign FL2

## 2020-03-17 NOTE — Progress Notes (Signed)
Subjective:  Patient ID: Dean Harris, male    DOB: 05-Mar-1941,  MRN: [id number]  79 y.o. male with memory problems presents with preventative diabetic foot care and painful thick toenails that are difficult to trim. Pain interferes with ambulation. Aggravating factors include wearing enclosed shoe gear. Pain is relieved with periodic professional debridement. Has numbness in feet. Has h/o gout.   Review of Systems: Negative except as noted in the HPI.   Past Medical History:  Diagnosis Date  . Chronic stable angina ([place])   . 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 Depression   . Diabetes mellitus   . Essential hypertension   . Hyperlipidemia with target LDL less than 70   . Memory difficulties   . 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 Pneumonia    x2  . 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 This has helped his memory issues.  . Thrombocytopenia ([place]) 04/29/2014  . Vitamin D deficiency    Past Surgical History:  Procedure Laterality Date  . CAROTID ENDARTERECTOMY    . LEFT HEART CATHETERIZATION WITH CORONARY ANGIOGRAM N/A 02/13/2012   Procedure: LEFT HEART CATHETERIZATION WITH CORONARY ANGIOGRAM;  Surgeon: Candee Furbish, MD;  Location: [facility] CATH LAB;  Service: Cardiovascular: Diffuse mid LAD 50%, 50%pOM1, 50%mRCA -> medical management  . TRANSTHORACIC ECHOCARDIOGRAM  01/2012   EF 55-60%. Mild LVH. No RWMA, mild Aortic Stenosis (mean/peak gradient 13 mmHg/21 mmHg)  . TRANSTHORACIC ECHOCARDIOGRAM  May 2017, May 2019   A) EF 60-65% w/o RWMA. Normal DF for age. Mod AD (mean/peak gradient 24/35 mmHg); b) EF 60-65%, mod LVH, Mod AS (Mean gradient (S): 28 mm Hg. Peak 43 mmHg  . US CAROTID DOPPLER BILATERAL ([facility] HX) Bilateral 02/05/2016    Stable mild-to-moderate disease with bilateral heterogeneous plaque. RICA - 0-27%, LICA 74-12%. Bilateral subclavian and vertebral arteries normal.   Patient Active Problem List   Diagnosis Date Noted  . AMS (altered mental status) 01/11/2020  . CAP (community acquired pneumonia) 01/10/2020  . Weight loss, non-intentional 05/20/2019  . Depression, recurrent ([place]) 12/01/2017  . Memory disorder 12/01/2017  . Carotid artery plaque, bilateral; s/p Bilateral CEA 07/24/2016  . Moderate aortic stenosis 01/28/2016  . Chronic stable angina ([place]) 01/28/2016  . BMI 30.0-30.9,adult 07/09/2015  . Thrombocytopenia ([place]) 04/29/2014  . Essential hypertension, benign 03/03/2014  . Coronary artery disease, non-occlusive   . Sleep apnea 11/30/2010  . Gout 11/30/2010  . Hyperlipidemia associated with type 2 diabetes mellitus ([place]) 11/30/2010  . Rosacea 11/30/2010  . COPD (chronic obstructive pulmonary disease) ([place]) 11/30/2010  . ED (erectile dysfunction) 11/30/2010  . Peripheral neuropathy 11/30/2010  . Diabetes ([place]) 09/16/1999    Current Outpatient Medications:  .  Accu-Chek Softclix Lancets lancets, CHECK BLOOD SUGAR DAILY AND AS NEEDED Dx E11.9, Disp: 100 each, Rfl: 3 .  aspirin EC 81 MG tablet, Take 1 tablet (81 mg total) by mouth daily., Disp: 90 tablet, Rfl: 3 .  b complex vitamins tablet, Take 1 tablet by mouth daily., Disp: , Rfl:  .  Blood Glucose Monitoring Suppl (ACCU-CHEK AVIVA PLUS) w/Device KIT, Check blood sugar daily and as needed, Disp: 1 kit, Rfl: 0 .  busPIRone (BUSPAR) 5 MG tablet, Take 5 mg by mouth daily as needed. anxiety, Disp: , Rfl:  .  clonazePAM (KLONOPIN) 0.5 MG tablet, Take 1 tablet (0.5 mg total) by mouth 2 (two) times daily as needed for anxiety., Disp: 60 tablet, Rfl: 1 .  donepezil (ARICEPT) 10 MG tablet, TAKE 1/2 (ONE-HALF) TABLET BY MOUTH AT BEDTIME (Needs to be seen before next refill), Disp: 45 tablet, Rfl: 1 .  Fluticasone-Salmeterol (ADVAIR DISKUS) 100-50  MCG/DOSE AEPB, Inhale 1 puff into the lungs every 12 (twelve) hours., Disp: 180 each, Rfl: 1 .  glucose blood (ACCU-CHEK AVIVA PLUS) test strip, Test blood sugar daily and as needed Dx E11.9, Disp: 100 each, Rfl: 3 .  Ipratropium-Albuterol (COMBIVENT) 20-100 MCG/ACT AERS respimat, Inhale 1 puff into the lungs every 6 (six) hours., Disp: 12 g, Rfl: 1 .  isosorbide mononitrate (IMDUR) 30 MG 24 hr tablet, Take 1 tablet (30 mg total) by mouth daily., Disp: 90 tablet, Rfl: 1 .  lisinopril (ZESTRIL) 20 MG tablet, Take 1 tablet (20 mg total) by mouth daily., Disp: 90 tablet, Rfl: 1 .  memantine (NAMENDA) 10 MG tablet, Take 1 tablet (10 mg total) by mouth 2 (two) times daily., Disp: 180 tablet, Rfl: 1 .  nitroGLYCERIN (NITROSTAT) 0.4 MG SL tablet, Use 1 tabket under tongue at onset and may repeat every 5 minutes x2, Disp: 25 tablet, Rfl: 5 .  predniSONE (DELTASONE) 20 MG tablet, Take 2 tablets daily with breakfast., Disp: 10 tablet, Rfl: 0 .  sertraline (ZOLOFT) 100 MG tablet, Take 1 tablet (100 mg total) by mouth daily. (Patient taking differently: Take 150 mg by mouth daily. ), Disp: 90 tablet, Rfl: 1 .  Travoprost, BAK Free, (TRAVATAN) 0.004 % SOLN ophthalmic solution, Place 1 drop into both eyes at bedtime., Disp: , Rfl:  Allergies  Allergen Reactions  . Dust Mite Extract Other (See Comments)    Sneezing, coughing  . Mold Extract [Trichophyton] Other (See Comments)    Sneezing, coughing  . Pollen Extract Other (See Comments)    Sneezing, coughing  . Tetracyclines & Related Other (See Comments)    Generic only   Social History   Tobacco Use  Smoking Status Former Smoker  . Types: Cigarettes  . Quit date: 02/10/1988  . Years since quitting: 32.1  Smokeless Tobacco Never Used   Objective:  There were no vitals filed for this visit. There is no height or weight on file to calculate BMI. Constitutional Well developed. Well nourished.  Vascular Dorsalis pedis pulses faintly palpable  bilaterally. Posterior tibial pulses faintly palpable bilaterally. Capillary refill normal to all digits.  No cyanosis or clubbing noted. Pedal hair growth absent b/l.  Neurologic Normal speech. Patient unable to comply with commands of neurological examination due to cognition status, but does respond to external noxious stimuli.  Dermatologic Nails elongated dystrophic pain to palpation 1-5 b/l. No open wounds. No skin lesions.  Orthopedic: Normal joint ROM without pain or crepitus bilaterally. HAV with bunio deformity b/l.  No bony tenderness.   Radiographs: None Assessment:   1. Pain due to onychomycosis of toenails of both feet   2. Hallux valgus, acquired, bilateral   3. Type 2 diabetes mellitus with diabetic polyneuropathy, without long-term current use of insulin ([place])    Plan:  Patient was evaluated and treated and all questions answered.  Onychomycosis with pain -Nails palliatively debridement as below. -Educated on self-care  Procedure: Nail Debridement Rationale: Pain Type of Debridement: manual, sharp debridement. Instrumentation: Nail nipper, rotary burr. Number of Nails: 10  -No new findings. No new orders. -Continue diabetic foot care principles. -Toenails 1-5  b/l were debrided in length and girth with sterile nail nippers and dremel without iatrogenic bleeding.  -Patient to continue soft, supportive shoe gear daily. -Patient/POA to call should there be question/concern in the interim.  Return in about 3 months (around 06/12/2020) for diabetic nail trim.  Marzetta Board, DPM

## 2020-03-20 NOTE — Telephone Encounter (Signed)
Form filled out to be placed on providers desk 

## 2020-03-20 NOTE — Telephone Encounter (Signed)
FL2 faxed to [facility] at [phone], [facility] aware

## 2020-03-22 ENCOUNTER — Other Ambulatory Visit: Payer: Self-pay | Admitting: Nurse Practitioner

## 2020-03-22 DIAGNOSIS — R413 Other amnesia: Secondary | ICD-10-CM

## 2020-03-22 DIAGNOSIS — I10 Essential (primary) hypertension: Secondary | ICD-10-CM

## 2020-03-22 DIAGNOSIS — J42 Unspecified chronic bronchitis: Secondary | ICD-10-CM

## 2020-03-22 DIAGNOSIS — I2510 Atherosclerotic heart disease of native coronary artery without angina pectoris: Secondary | ICD-10-CM

## 2020-03-22 DIAGNOSIS — F339 Major depressive disorder, recurrent, unspecified: Secondary | ICD-10-CM

## 2020-03-22 MED ORDER — LISINOPRIL 20 MG PO TABS: 20.0000 mg | ORAL_TABLET | Freq: Every day | ORAL | 1 refills | Status: DC

## 2020-03-22 MED ORDER — TRAVOPROST (BAK FREE) 0.004 % OP SOLN: 1.0000 [drp] | Freq: Every day | OPHTHALMIC | 11 refills | Status: AC

## 2020-03-22 MED ORDER — FLUTICASONE-SALMETEROL 100-50 MCG/DOSE IN AEPB: 1.0000 | INHALATION_SPRAY | Freq: Two times a day (BID) | RESPIRATORY_TRACT | 1 refills | Status: DC

## 2020-03-22 MED ORDER — ISOSORBIDE MONONITRATE ER 30 MG PO TB24: 30.0000 mg | ORAL_TABLET | Freq: Every day | ORAL | 1 refills | Status: DC

## 2020-03-22 MED ORDER — BUSPIRONE HCL 5 MG PO TABS: 5.0000 mg | ORAL_TABLET | Freq: Two times a day (BID) | ORAL | 1 refills | Status: DC

## 2020-03-22 MED ORDER — DONEPEZIL HCL 10 MG PO TABS: ORAL_TABLET | ORAL | 1 refills | Status: AC

## 2020-03-22 MED ORDER — SERTRALINE HCL 100 MG PO TABS: 200.0000 mg | ORAL_TABLET | Freq: Every morning | ORAL | 1 refills | Status: AC

## 2020-03-22 MED ORDER — IPRATROPIUM-ALBUTEROL 20-100 MCG/ACT IN AERS: 1.0000 | INHALATION_SPRAY | Freq: Four times a day (QID) | RESPIRATORY_TRACT | 1 refills | Status: DC

## 2020-03-22 MED ORDER — MEMANTINE HCL 10 MG PO TABS: 10.0000 mg | ORAL_TABLET | Freq: Two times a day (BID) | ORAL | 1 refills | Status: AC

## 2020-03-25 DIAGNOSIS — Z20822 Contact with and (suspected) exposure to covid-19: Secondary | ICD-10-CM | POA: Diagnosis not present

## 2020-03-26 ENCOUNTER — Other Ambulatory Visit (HOSPITAL_COMMUNITY): Payer: Medicare PPO

## 2020-03-26 ENCOUNTER — Ambulatory Visit (HOSPITAL_COMMUNITY): Payer: Medicare PPO | Admitting: Hematology

## 2020-04-02 DIAGNOSIS — I2510 Atherosclerotic heart disease of native coronary artery without angina pectoris: Secondary | ICD-10-CM | POA: Diagnosis not present

## 2020-04-02 DIAGNOSIS — I10 Essential (primary) hypertension: Secondary | ICD-10-CM | POA: Diagnosis not present

## 2020-04-02 DIAGNOSIS — E119 Type 2 diabetes mellitus without complications: Secondary | ICD-10-CM | POA: Diagnosis not present

## 2020-04-02 DIAGNOSIS — G4730 Sleep apnea, unspecified: Secondary | ICD-10-CM | POA: Diagnosis not present

## 2020-04-02 DIAGNOSIS — F0151 Vascular dementia with behavioral disturbance: Secondary | ICD-10-CM | POA: Diagnosis not present

## 2020-04-02 DIAGNOSIS — J449 Chronic obstructive pulmonary disease, unspecified: Secondary | ICD-10-CM | POA: Diagnosis not present

## 2020-04-11 DIAGNOSIS — I10 Essential (primary) hypertension: Secondary | ICD-10-CM | POA: Diagnosis not present

## 2020-04-11 DIAGNOSIS — E039 Hypothyroidism, unspecified: Secondary | ICD-10-CM | POA: Diagnosis not present

## 2020-04-11 DIAGNOSIS — E119 Type 2 diabetes mellitus without complications: Secondary | ICD-10-CM | POA: Diagnosis not present

## 2020-04-11 DIAGNOSIS — F0150 Vascular dementia without behavioral disturbance: Secondary | ICD-10-CM | POA: Diagnosis not present

## 2020-04-13 DIAGNOSIS — R4182 Altered mental status, unspecified: Secondary | ICD-10-CM | POA: Diagnosis not present

## 2020-04-13 DIAGNOSIS — R262 Difficulty in walking, not elsewhere classified: Secondary | ICD-10-CM | POA: Diagnosis not present

## 2020-04-13 DIAGNOSIS — R2681 Unsteadiness on feet: Secondary | ICD-10-CM | POA: Diagnosis not present

## 2020-04-13 DIAGNOSIS — J189 Pneumonia, unspecified organism: Secondary | ICD-10-CM | POA: Diagnosis not present

## 2020-04-23 DIAGNOSIS — Z9181 History of falling: Secondary | ICD-10-CM | POA: Diagnosis not present

## 2020-04-23 DIAGNOSIS — I10 Essential (primary) hypertension: Secondary | ICD-10-CM | POA: Diagnosis not present

## 2020-04-23 DIAGNOSIS — E1142 Type 2 diabetes mellitus with diabetic polyneuropathy: Secondary | ICD-10-CM | POA: Diagnosis not present

## 2020-04-23 DIAGNOSIS — J449 Chronic obstructive pulmonary disease, unspecified: Secondary | ICD-10-CM | POA: Diagnosis not present

## 2020-04-23 DIAGNOSIS — F0390 Unspecified dementia without behavioral disturbance: Secondary | ICD-10-CM | POA: Diagnosis not present

## 2020-04-23 DIAGNOSIS — F419 Anxiety disorder, unspecified: Secondary | ICD-10-CM | POA: Diagnosis not present

## 2020-04-23 DIAGNOSIS — I2510 Atherosclerotic heart disease of native coronary artery without angina pectoris: Secondary | ICD-10-CM | POA: Diagnosis not present

## 2020-04-23 DIAGNOSIS — G4730 Sleep apnea, unspecified: Secondary | ICD-10-CM | POA: Diagnosis not present

## 2020-04-24 ENCOUNTER — Telehealth: Payer: Self-pay | Admitting: *Deleted

## 2020-04-24 NOTE — Telephone Encounter (Signed)
A message was left, re: his follow up visit. 

## 2020-04-30 DIAGNOSIS — E1142 Type 2 diabetes mellitus with diabetic polyneuropathy: Secondary | ICD-10-CM | POA: Diagnosis not present

## 2020-04-30 DIAGNOSIS — Z9181 History of falling: Secondary | ICD-10-CM | POA: Diagnosis not present

## 2020-04-30 DIAGNOSIS — F0390 Unspecified dementia without behavioral disturbance: Secondary | ICD-10-CM | POA: Diagnosis not present

## 2020-04-30 DIAGNOSIS — F419 Anxiety disorder, unspecified: Secondary | ICD-10-CM | POA: Diagnosis not present

## 2020-04-30 DIAGNOSIS — J449 Chronic obstructive pulmonary disease, unspecified: Secondary | ICD-10-CM | POA: Diagnosis not present

## 2020-04-30 DIAGNOSIS — G4730 Sleep apnea, unspecified: Secondary | ICD-10-CM | POA: Diagnosis not present

## 2020-04-30 DIAGNOSIS — I10 Essential (primary) hypertension: Secondary | ICD-10-CM | POA: Diagnosis not present

## 2020-04-30 DIAGNOSIS — I2510 Atherosclerotic heart disease of native coronary artery without angina pectoris: Secondary | ICD-10-CM | POA: Diagnosis not present

## 2020-05-02 DIAGNOSIS — G4730 Sleep apnea, unspecified: Secondary | ICD-10-CM | POA: Diagnosis not present

## 2020-05-02 DIAGNOSIS — I2510 Atherosclerotic heart disease of native coronary artery without angina pectoris: Secondary | ICD-10-CM | POA: Diagnosis not present

## 2020-05-02 DIAGNOSIS — F419 Anxiety disorder, unspecified: Secondary | ICD-10-CM | POA: Diagnosis not present

## 2020-05-02 DIAGNOSIS — F0390 Unspecified dementia without behavioral disturbance: Secondary | ICD-10-CM | POA: Diagnosis not present

## 2020-05-02 DIAGNOSIS — Z9181 History of falling: Secondary | ICD-10-CM | POA: Diagnosis not present

## 2020-05-02 DIAGNOSIS — J449 Chronic obstructive pulmonary disease, unspecified: Secondary | ICD-10-CM | POA: Diagnosis not present

## 2020-05-02 DIAGNOSIS — E1142 Type 2 diabetes mellitus with diabetic polyneuropathy: Secondary | ICD-10-CM | POA: Diagnosis not present

## 2020-05-02 DIAGNOSIS — I10 Essential (primary) hypertension: Secondary | ICD-10-CM | POA: Diagnosis not present

## 2020-05-04 DIAGNOSIS — F0390 Unspecified dementia without behavioral disturbance: Secondary | ICD-10-CM | POA: Diagnosis not present

## 2020-05-04 DIAGNOSIS — I2510 Atherosclerotic heart disease of native coronary artery without angina pectoris: Secondary | ICD-10-CM | POA: Diagnosis not present

## 2020-05-04 DIAGNOSIS — J449 Chronic obstructive pulmonary disease, unspecified: Secondary | ICD-10-CM | POA: Diagnosis not present

## 2020-05-04 DIAGNOSIS — F419 Anxiety disorder, unspecified: Secondary | ICD-10-CM | POA: Diagnosis not present

## 2020-05-04 DIAGNOSIS — I10 Essential (primary) hypertension: Secondary | ICD-10-CM | POA: Diagnosis not present

## 2020-05-04 DIAGNOSIS — Z9181 History of falling: Secondary | ICD-10-CM | POA: Diagnosis not present

## 2020-05-04 DIAGNOSIS — E1142 Type 2 diabetes mellitus with diabetic polyneuropathy: Secondary | ICD-10-CM | POA: Diagnosis not present

## 2020-05-04 DIAGNOSIS — G4730 Sleep apnea, unspecified: Secondary | ICD-10-CM | POA: Diagnosis not present

## 2020-05-07 DIAGNOSIS — F419 Anxiety disorder, unspecified: Secondary | ICD-10-CM | POA: Diagnosis not present

## 2020-05-07 DIAGNOSIS — I10 Essential (primary) hypertension: Secondary | ICD-10-CM | POA: Diagnosis not present

## 2020-05-07 DIAGNOSIS — E1142 Type 2 diabetes mellitus with diabetic polyneuropathy: Secondary | ICD-10-CM | POA: Diagnosis not present

## 2020-05-07 DIAGNOSIS — J449 Chronic obstructive pulmonary disease, unspecified: Secondary | ICD-10-CM | POA: Diagnosis not present

## 2020-05-07 DIAGNOSIS — Z9181 History of falling: Secondary | ICD-10-CM | POA: Diagnosis not present

## 2020-05-07 DIAGNOSIS — I2510 Atherosclerotic heart disease of native coronary artery without angina pectoris: Secondary | ICD-10-CM | POA: Diagnosis not present

## 2020-05-07 DIAGNOSIS — F0390 Unspecified dementia without behavioral disturbance: Secondary | ICD-10-CM | POA: Diagnosis not present

## 2020-05-07 DIAGNOSIS — G4730 Sleep apnea, unspecified: Secondary | ICD-10-CM | POA: Diagnosis not present

## 2020-05-09 ENCOUNTER — Inpatient Hospital Stay (HOSPITAL_BASED_OUTPATIENT_CLINIC_OR_DEPARTMENT_OTHER): Payer: Medicare PPO

## 2020-05-09 ENCOUNTER — Emergency Department (HOSPITAL_COMMUNITY): Payer: Medicare PPO

## 2020-05-09 ENCOUNTER — Observation Stay (HOSPITAL_COMMUNITY)
Admission: EM | Admit: 2020-05-09 | Discharge: 2020-05-10 | Disposition: A | Discharge status: Skilled Nursing Facility | Payer: Medicare PPO | Attending: Family Medicine | Admitting: Family Medicine

## 2020-05-09 ENCOUNTER — Other Ambulatory Visit: Payer: Self-pay

## 2020-05-09 ENCOUNTER — Encounter (HOSPITAL_COMMUNITY): Payer: Self-pay

## 2020-05-09 DIAGNOSIS — I350 Nonrheumatic aortic (valve) stenosis: Secondary | ICD-10-CM | POA: Diagnosis not present

## 2020-05-09 DIAGNOSIS — R2689 Other abnormalities of gait and mobility: Secondary | ICD-10-CM | POA: Diagnosis not present

## 2020-05-09 DIAGNOSIS — I959 Hypotension, unspecified: Secondary | ICD-10-CM | POA: Diagnosis not present

## 2020-05-09 DIAGNOSIS — N179 Acute kidney failure, unspecified: Secondary | ICD-10-CM | POA: Diagnosis present

## 2020-05-09 DIAGNOSIS — J479 Bronchiectasis, uncomplicated: Secondary | ICD-10-CM | POA: Diagnosis not present

## 2020-05-09 DIAGNOSIS — Z7982 Long term (current) use of aspirin: Secondary | ICD-10-CM | POA: Diagnosis not present

## 2020-05-09 DIAGNOSIS — E119 Type 2 diabetes mellitus without complications: Secondary | ICD-10-CM

## 2020-05-09 DIAGNOSIS — I6201 Nontraumatic acute subdural hemorrhage: Secondary | ICD-10-CM | POA: Diagnosis not present

## 2020-05-09 DIAGNOSIS — I5032 Chronic diastolic (congestive) heart failure: Secondary | ICD-10-CM | POA: Insufficient documentation

## 2020-05-09 DIAGNOSIS — R4182 Altered mental status, unspecified: Secondary | ICD-10-CM | POA: Diagnosis present

## 2020-05-09 DIAGNOSIS — D696 Thrombocytopenia, unspecified: Secondary | ICD-10-CM | POA: Diagnosis present

## 2020-05-09 DIAGNOSIS — I129 Hypertensive chronic kidney disease with stage 1 through stage 4 chronic kidney disease, or unspecified chronic kidney disease: Secondary | ICD-10-CM | POA: Insufficient documentation

## 2020-05-09 DIAGNOSIS — R55 Syncope and collapse: Secondary | ICD-10-CM

## 2020-05-09 DIAGNOSIS — F03918 Unspecified dementia, unspecified severity, with other behavioral disturbance: Secondary | ICD-10-CM

## 2020-05-09 DIAGNOSIS — N189 Chronic kidney disease, unspecified: Secondary | ICD-10-CM | POA: Diagnosis not present

## 2020-05-09 DIAGNOSIS — W050XXA Fall from non-moving wheelchair, initial encounter: Secondary | ICD-10-CM | POA: Diagnosis not present

## 2020-05-09 DIAGNOSIS — G319 Degenerative disease of nervous system, unspecified: Secondary | ICD-10-CM | POA: Diagnosis not present

## 2020-05-09 DIAGNOSIS — G9389 Other specified disorders of brain: Secondary | ICD-10-CM | POA: Diagnosis not present

## 2020-05-09 DIAGNOSIS — E860 Dehydration: Secondary | ICD-10-CM

## 2020-05-09 DIAGNOSIS — F0390 Unspecified dementia without behavioral disturbance: Secondary | ICD-10-CM | POA: Diagnosis not present

## 2020-05-09 DIAGNOSIS — F329 Major depressive disorder, single episode, unspecified: Secondary | ICD-10-CM | POA: Insufficient documentation

## 2020-05-09 DIAGNOSIS — R231 Pallor: Secondary | ICD-10-CM | POA: Diagnosis not present

## 2020-05-09 DIAGNOSIS — I619 Nontraumatic intracerebral hemorrhage, unspecified: Secondary | ICD-10-CM | POA: Diagnosis not present

## 2020-05-09 DIAGNOSIS — Z20822 Contact with and (suspected) exposure to covid-19: Secondary | ICD-10-CM | POA: Insufficient documentation

## 2020-05-09 DIAGNOSIS — G629 Polyneuropathy, unspecified: Secondary | ICD-10-CM

## 2020-05-09 DIAGNOSIS — M542 Cervicalgia: Secondary | ICD-10-CM | POA: Diagnosis not present

## 2020-05-09 DIAGNOSIS — S065X0A Traumatic subdural hemorrhage without loss of consciousness, initial encounter: Secondary | ICD-10-CM | POA: Diagnosis not present

## 2020-05-09 DIAGNOSIS — I7090 Unspecified atherosclerosis: Secondary | ICD-10-CM | POA: Diagnosis not present

## 2020-05-09 DIAGNOSIS — R531 Weakness: Secondary | ICD-10-CM | POA: Diagnosis not present

## 2020-05-09 DIAGNOSIS — I6523 Occlusion and stenosis of bilateral carotid arteries: Secondary | ICD-10-CM | POA: Diagnosis present

## 2020-05-09 DIAGNOSIS — R404 Transient alteration of awareness: Secondary | ICD-10-CM | POA: Diagnosis not present

## 2020-05-09 DIAGNOSIS — I10 Essential (primary) hypertension: Secondary | ICD-10-CM | POA: Diagnosis present

## 2020-05-09 DIAGNOSIS — E1121 Type 2 diabetes mellitus with diabetic nephropathy: Secondary | ICD-10-CM | POA: Insufficient documentation

## 2020-05-09 DIAGNOSIS — F0391 Unspecified dementia with behavioral disturbance: Secondary | ICD-10-CM

## 2020-05-09 DIAGNOSIS — E785 Hyperlipidemia, unspecified: Secondary | ICD-10-CM | POA: Insufficient documentation

## 2020-05-09 DIAGNOSIS — I25119 Atherosclerotic heart disease of native coronary artery with unspecified angina pectoris: Secondary | ICD-10-CM | POA: Diagnosis not present

## 2020-05-09 DIAGNOSIS — R4020 Unspecified coma: Secondary | ICD-10-CM | POA: Diagnosis not present

## 2020-05-09 DIAGNOSIS — N1831 Chronic kidney disease, stage 3a: Secondary | ICD-10-CM

## 2020-05-09 DIAGNOSIS — S0101XA Laceration without foreign body of scalp, initial encounter: Secondary | ICD-10-CM | POA: Diagnosis not present

## 2020-05-09 DIAGNOSIS — Z79899 Other long term (current) drug therapy: Secondary | ICD-10-CM | POA: Insufficient documentation

## 2020-05-09 DIAGNOSIS — R9082 White matter disease, unspecified: Secondary | ICD-10-CM | POA: Diagnosis not present

## 2020-05-09 LAB — URINALYSIS, ROUTINE W REFLEX MICROSCOPIC
Bilirubin Urine: NEGATIVE
Glucose, UA: NEGATIVE mg/dL
Ketones, ur: NEGATIVE mg/dL
Nitrite: NEGATIVE
Protein, ur: NEGATIVE mg/dL
Specific Gravity, Urine: 1.012 (ref 1.005–1.030)
pH: 5 (ref 5.0–8.0)

## 2020-05-09 LAB — PHOSPHORUS: Phosphorus: 3.9 mg/dL (ref 2.5–4.6)

## 2020-05-09 LAB — CBC WITH DIFFERENTIAL/PLATELET
Abs Immature Granulocytes: 0.04 10*3/uL (ref 0.00–0.07)
Basophils Absolute: 0 10*3/uL (ref 0.0–0.1)
Basophils Relative: 1 %
Eosinophils Absolute: 0.1 10*3/uL (ref 0.0–0.5)
Eosinophils Relative: 1 %
HCT: 36.6 % — ABNORMAL LOW (ref 39.0–52.0)
Hemoglobin: 11.7 g/dL — ABNORMAL LOW (ref 13.0–17.0)
Immature Granulocytes: 1 %
Lymphocytes Relative: 9 %
Lymphs Abs: 0.7 10*3/uL (ref 0.7–4.0)
MCH: 29.2 pg (ref 26.0–34.0)
MCHC: 32 g/dL (ref 30.0–36.0)
MCV: 91.3 fL (ref 80.0–100.0)
Monocytes Absolute: 0.5 10*3/uL (ref 0.1–1.0)
Monocytes Relative: 7 %
Neutro Abs: 6.5 10*3/uL (ref 1.7–7.7)
Neutrophils Relative %: 81 %
Platelets: 114 10*3/uL — ABNORMAL LOW (ref 150–400)
RBC: 4.01 MIL/uL — ABNORMAL LOW (ref 4.22–5.81)
RDW: 12.9 % (ref 11.5–15.5)
WBC: 7.9 10*3/uL (ref 4.0–10.5)
nRBC: 0 % (ref 0.0–0.2)

## 2020-05-09 LAB — CBC
HCT: 34 % — ABNORMAL LOW (ref 39.0–52.0)
Hemoglobin: 11.2 g/dL — ABNORMAL LOW (ref 13.0–17.0)
MCH: 29.6 pg (ref 26.0–34.0)
MCHC: 32.9 g/dL (ref 30.0–36.0)
MCV: 89.9 fL (ref 80.0–100.0)
Platelets: 117 10*3/uL — ABNORMAL LOW (ref 150–400)
RBC: 3.78 MIL/uL — ABNORMAL LOW (ref 4.22–5.81)
RDW: 12.9 % (ref 11.5–15.5)
WBC: 6.2 10*3/uL (ref 4.0–10.5)
nRBC: 0 % (ref 0.0–0.2)

## 2020-05-09 LAB — COMPREHENSIVE METABOLIC PANEL
ALT: 19 U/L (ref 0–44)
AST: 16 U/L (ref 15–41)
Albumin: 3.2 g/dL — ABNORMAL LOW (ref 3.5–5.0)
Alkaline Phosphatase: 63 U/L (ref 38–126)
Anion gap: 9 (ref 5–15)
BUN: 26 mg/dL — ABNORMAL HIGH (ref 8–23)
CO2: 24 mmol/L (ref 22–32)
Calcium: 8.5 mg/dL — ABNORMAL LOW (ref 8.9–10.3)
Chloride: 105 mmol/L (ref 98–111)
Creatinine, Ser: 1.52 mg/dL — ABNORMAL HIGH (ref 0.61–1.24)
GFR calc Af Amer: 51 mL/min — ABNORMAL LOW (ref >60)
GFR calc non Af Amer: 44 mL/min — ABNORMAL LOW (ref >60)
Glucose, Bld: 256 mg/dL — ABNORMAL HIGH (ref 70–99)
Potassium: 4.2 mmol/L (ref 3.5–5.1)
Sodium: 138 mmol/L (ref 135–145)
Total Bilirubin: 1.4 mg/dL — ABNORMAL HIGH (ref 0.3–1.2)
Total Protein: 6 g/dL — ABNORMAL LOW (ref 6.5–8.1)

## 2020-05-09 LAB — ECHOCARDIOGRAM COMPLETE
AR max vel: 0.99 cm2
AV Area VTI: 1.02 cm2
AV Area mean vel: 0.98 cm2
AV Mean grad: 32 mmHg
AV Peak grad: 49 mmHg
Ao pk vel: 3.5 m/s
Area-P 1/2: 2.83 cm2
Height: 62 in
S' Lateral: 2.7 cm
Weight: 2246.4 [oz_av]

## 2020-05-09 LAB — FOLATE: Folate: 27.2 ng/mL (ref >5.9)

## 2020-05-09 LAB — CREATININE, SERUM
Creatinine, Ser: 1.27 mg/dL — ABNORMAL HIGH (ref 0.61–1.24)
GFR calc Af Amer: >60 mL/min (ref >60)
GFR calc non Af Amer: 55 mL/min — ABNORMAL LOW (ref >60)

## 2020-05-09 LAB — LACTIC ACID, PLASMA
Lactic Acid, Venous: 1.1 mmol/L (ref 0.5–1.9)
Lactic Acid, Venous: 1 mmol/L (ref 0.5–1.9)

## 2020-05-09 LAB — MAGNESIUM: Magnesium: 1.9 mg/dL (ref 1.7–2.4)

## 2020-05-09 LAB — SARS CORONAVIRUS 2 BY RT PCR (HOSPITAL ORDER, PERFORMED IN ~~LOC~~ HOSPITAL LAB): SARS Coronavirus 2: NEGATIVE

## 2020-05-09 LAB — TROPONIN I (HIGH SENSITIVITY)
Troponin I (High Sensitivity): 9 ng/L (ref <18)
Troponin I (High Sensitivity): 10 ng/L (ref <18)

## 2020-05-09 LAB — VITAMIN B12: Vitamin B-12: 389 pg/mL (ref 180–914)

## 2020-05-09 LAB — ETHANOL: Alcohol, Ethyl (B): <10 mg/dL (ref <10)

## 2020-05-09 LAB — AMMONIA: Ammonia: 11 umol/L (ref 9–35)

## 2020-05-09 LAB — TSH: TSH: 2.106 u[IU]/mL (ref 0.350–4.500)

## 2020-05-09 MED ORDER — SODIUM CHLORIDE 0.9 % IV SOLN: INTRAVENOUS | Status: DC

## 2020-05-09 MED ORDER — ONDANSETRON HCL 4 MG PO TABS: 4.0000 mg | ORAL_TABLET | Freq: Four times a day (QID) | ORAL | Status: DC | PRN

## 2020-05-09 MED ORDER — SODIUM CHLORIDE 0.9 % IV BOLUS
500.0000 mL | Freq: Once | INTRAVENOUS | Status: AC
  Administered 2020-05-09: 500 mL via INTRAVENOUS

## 2020-05-09 MED ORDER — SODIUM CHLORIDE 0.9 % IV BOLUS
1000.0000 mL | Freq: Once | INTRAVENOUS | Status: AC
  Administered 2020-05-09: 1000 mL via INTRAVENOUS

## 2020-05-09 MED ORDER — SODIUM CHLORIDE 0.9% FLUSH
3.0000 mL | Freq: Two times a day (BID) | INTRAVENOUS | Status: DC
  Administered 2020-05-09: 3 mL via INTRAVENOUS

## 2020-05-09 MED ORDER — ONDANSETRON HCL 4 MG/2ML IJ SOLN: 4.0000 mg | Freq: Four times a day (QID) | INTRAMUSCULAR | Status: DC | PRN

## 2020-05-09 MED ORDER — SODIUM CHLORIDE 0.9 % IV SOLN
1.0000 g | INTRAVENOUS | Status: DC
  Administered 2020-05-09: 1 g via INTRAVENOUS
  Filled 2020-05-09 (×2): qty 10

## 2020-05-09 MED ORDER — ACETAMINOPHEN 325 MG PO TABS: 650.0000 mg | ORAL_TABLET | Freq: Four times a day (QID) | ORAL | Status: DC | PRN

## 2020-05-09 MED ORDER — ACETAMINOPHEN 650 MG RE SUPP: 650.0000 mg | Freq: Four times a day (QID) | RECTAL | Status: DC | PRN

## 2020-05-09 MED ORDER — ENOXAPARIN SODIUM 40 MG/0.4ML ~~LOC~~ SOLN
40.0000 mg | SUBCUTANEOUS | Status: DC
  Administered 2020-05-09: 40 mg via SUBCUTANEOUS
  Filled 2020-05-09: qty 0.4

## 2020-05-09 NOTE — ED Triage Notes (Signed)
Pt BIB EMS from [facility] with c/o near syncopal. Pt was in chair when he lowered himself to the ground. Staff called out for possible seizure with no history. EMS states that his bp was low on arrival. EMS thinks it could've been syncopal episode. Staff reports pt urination on himself. Pt has vascular dementia and diabetes.Pt baseline is unknown

## 2020-05-09 NOTE — Progress Notes (Signed)
Carotid study completed.   See CVProc for preliminary results.   Ortha Metts, RDMS, RVT 

## 2020-05-09 NOTE — Progress Notes (Signed)
  Echocardiogram 2D Echocardiogram has been performed.  Dean Harris 05/09/2020, 4:43 PM

## 2020-05-09 NOTE — ED Provider Notes (Signed)
[facility] EMERGENCY DEPARTMENT Provider Note   CSN: [id number] Arrival date & time: 05/09/20  1135     History Chief Complaint  Patient presents with  . Near Syncope    Dean Harris is a 79 y.o. male.  The history is provided by medical records and the EMS personnel. No language interpreter was used.  Near Syncope   Dean Harris is a 79 y.o. male who presents to the Emergency Department complaining of syncope. Level V caveat due to dementia. History is provided by EMS. Per EMS they were called out to his facility for syncopal event versus seizure. He has been started on ciprofloxacin for UTI several days ago. Today he was sitting in his wheelchair when he slid to the floor and was unresponsive with some tremors in his arms. He did have urinary incontinence. He does have a history of tremors in his arms. EMS on arrival had blood pressure of 80 systolic. His blood pressure improved after IV fluid administration. They reported sugar in the 300s. He has dementia and nursing facility reports that he is sleepier than baseline.    Past Medical History:  Diagnosis Date  . Chronic stable angina ([place])   . 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 Depression   . Diabetes mellitus   . Essential hypertension   . Hyperlipidemia with target LDL less than 70   . Memory difficulties   . 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 Pneumonia    x2  . 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 This has helped his memory issues.  . Thrombocytopenia ([place]) 04/29/2014  . Vitamin D deficiency     Patient Active Problem List   Diagnosis Date Noted  . AMS (altered mental status) 01/11/2020  . CAP (community acquired pneumonia) 01/10/2020    . Weight loss, non-intentional 05/20/2019  . Depression, recurrent ([place]) 12/01/2017  . Memory disorder 12/01/2017  . Carotid artery plaque, bilateral; s/p Bilateral CEA 07/24/2016  . Moderate aortic stenosis 01/28/2016  . Chronic stable angina ([place]) 01/28/2016  . BMI 30.0-30.9,adult 07/09/2015  . Thrombocytopenia ([place]) 04/29/2014  . Essential hypertension, benign 03/03/2014  . Coronary artery disease, non-occlusive   . Sleep apnea 11/30/2010  . Gout 11/30/2010  . Hyperlipidemia associated with type 2 diabetes mellitus ([place]) 11/30/2010  . Rosacea 11/30/2010  . COPD (chronic obstructive pulmonary disease) ([place]) 11/30/2010  . ED (erectile dysfunction) 11/30/2010  . Peripheral neuropathy 11/30/2010  . Diabetes ([place]) 09/16/1999    Past Surgical History:  Procedure Laterality Date  . CAROTID ENDARTERECTOMY    . LEFT HEART CATHETERIZATION WITH CORONARY ANGIOGRAM N/A 02/13/2012   Procedure: LEFT HEART CATHETERIZATION WITH CORONARY ANGIOGRAM;  Surgeon: Candee Furbish, MD;  Location: [facility] CATH LAB;  Service: Cardiovascular: Diffuse mid LAD 50%, 50%pOM1, 50%mRCA -> medical management  . TRANSTHORACIC ECHOCARDIOGRAM  01/2012   EF 55-60%. Mild LVH. No RWMA, mild Aortic Stenosis (mean/peak gradient 13 mmHg/21 mmHg)  . TRANSTHORACIC ECHOCARDIOGRAM  May 2017, May 2019   A) EF 60-65% w/o RWMA. Normal DF for age. Mod AD (mean/peak gradient 24/35 mmHg); b) EF 60-65%, mod LVH, Mod AS (Mean gradient (S): 28 mm Hg. Peak 43 mmHg  . US CAROTID DOPPLER BILATERAL ([facility] HX) Bilateral 02/05/2016   Stable mild-to-moderate disease  with bilateral heterogeneous plaque. RICA - 9-74%, LICA 16-38%. Bilateral subclavian and vertebral arteries normal.       Family History  Problem Relation Age of Onset  . Diabetes Mother   . Stroke Mother   . Hypertension Mother   . Hyperlipidemia Mother   . Cancer Father   . Hypertension Brother   . Cancer Other     Social History   Tobacco Use  . Smoking status: Former  Smoker    Types: Cigarettes    Quit date: 02/10/1988    Years since quitting: 32.2  . Smokeless tobacco: Never Used  Vaping Use  . Vaping Use: Never used  Substance Use Topics  . Alcohol use: No  . Drug use: No    Home Medications Prior to Admission medications   Medication Sig Start Date End Date Taking? Authorizing Provider  Accu-Chek Softclix Lancets lancets CHECK BLOOD SUGAR DAILY AND AS NEEDED Dx E11.9 08/17/19   Hassell Done, Mary-Margaret, FNP  aspirin EC 81 MG tablet Take 1 tablet (81 mg total) by mouth daily. 05/20/19   Leonie Man, MD  b complex vitamins tablet Take 1 tablet by mouth daily.    [provider]  Blood Glucose Monitoring Suppl (ACCU-CHEK AVIVA PLUS) w/Device KIT Check blood sugar daily and as needed 08/17/19   Hassell Done, Mary-Margaret, FNP  busPIRone (BUSPAR) 5 MG tablet Take 1 tablet (5 mg total) by mouth 2 (two) times daily with breakfast and lunch. anxiety 03/22/20   Hassell Done, Mary-Margaret, FNP  donepezil (ARICEPT) 10 MG tablet TAKE 1/2 (ONE-HALF) TABLET BY MOUTH AT BEDTIME (Needs to be seen before next refill) 03/22/20   Hassell Done, Mary-Margaret, FNP  Fluticasone-Salmeterol (ADVAIR DISKUS) 100-50 MCG/DOSE AEPB Inhale 1 puff into the lungs every 12 (twelve) hours. 03/22/20   Hassell Done, Mary-Margaret, FNP  glucose blood (ACCU-CHEK AVIVA PLUS) test strip Test blood sugar daily and as needed Dx E11.9 08/17/19   Hassell Done, Mary-Margaret, FNP  Ipratropium-Albuterol (COMBIVENT) 20-100 MCG/ACT AERS respimat Inhale 1 puff into the lungs every 6 (six) hours. 03/22/20   Hassell Done, Mary-Margaret, FNP  isosorbide mononitrate (IMDUR) 30 MG 24 hr tablet Take 1 tablet (30 mg total) by mouth daily. 03/22/20   Hassell Done, Mary-Margaret, FNP  lisinopril (ZESTRIL) 20 MG tablet Take 1 tablet (20 mg total) by mouth daily. 03/22/20   Hassell Done, Mary-Margaret, FNP  memantine (NAMENDA) 10 MG tablet Take 1 tablet (10 mg total) by mouth 2 (two) times daily. 03/22/20   Hassell Done Mary-Margaret, FNP  nitroGLYCERIN (NITROSTAT)  0.4 MG SL tablet Use 1 tabket under tongue at onset and may repeat every 5 minutes x2 02/15/20   Hassell Done, Mary-Margaret, FNP  sertraline (ZOLOFT) 100 MG tablet Take 2 tablets (200 mg total) by mouth in the morning. 03/22/20   Hassell Done, Mary-Margaret, FNP  Travoprost, BAK Free, (TRAVATAN) 0.004 % SOLN ophthalmic solution Place 1 drop into both eyes at bedtime. 03/22/20   Hassell Done Mary-Margaret, FNP    Allergies    Dust mite extract, Mold extract [trichophyton], Pollen extract, and Tetracyclines & related  Review of Systems   Review of Systems  Cardiovascular: Positive for near-syncope.  All other systems reviewed and are negative.   Physical Exam Updated Vital Signs Pulse 63   Temp 99.9 F (37.7 C) (Oral)   Resp 18   SpO2 99%   Physical Exam Vitals and nursing note reviewed.  Constitutional:      Appearance: He is well-developed.  HENT:     Head: Normocephalic and atraumatic.  Cardiovascular:  Rate and Rhythm: Normal rate and regular rhythm.     Heart sounds: No murmur heard.   Pulmonary:     Effort: Pulmonary effort is normal. No respiratory distress.     Breath sounds: Normal breath sounds.  Abdominal:     Palpations: Abdomen is soft.     Tenderness: There is no abdominal tenderness. There is no guarding or rebound.  Musculoskeletal:        General: No tenderness.  Skin:    General: Skin is warm and dry.  Neurological:     Mental Status: He is alert.     Comments: Nonverbal. Does not follow commands. Actively resist eye-opening strongly and symmetrically. Weekly moves bilateral upper extremities. He has increased tone and bilateral lower extremities and appears to be resisting strength testing.  Psychiatric:     Comments: Unable to assess     ED Results / Procedures / Treatments   Labs (all labs ordered are listed, but only abnormal results are displayed) Labs Reviewed  URINE CULTURE  CULTURE, BLOOD (SINGLE)  COMPREHENSIVE METABOLIC PANEL  LACTIC ACID, PLASMA    LACTIC ACID, PLASMA  CBC WITH DIFFERENTIAL/PLATELET  URINALYSIS, ROUTINE W REFLEX MICROSCOPIC  TROPONIN I (HIGH SENSITIVITY)    EKG None  Radiology No results found.  Procedures Procedures (including critical care time)  Medications Ordered in ED Medications - No data to display  ED Course  I have reviewed the triage vital signs and the nursing notes.  Pertinent labs & imaging results that were available during my care of the patient were reviewed by me and considered in my medical decision making (see chart for details).    MDM Rules/Calculators/A&P                         Patient with history of dementia coming from facility for evaluation following near syncopal event with possible seizure activity. On evaluation patient is drowsy, difficult to examine due to inability to cooperate or follow commands. He does move all extremities. He did have low blood pressures on ED presentation and he was treated with IV fluid hydration. He has recently been treated with antibiotics for UTI. Urinalysis in the emergency department is not consistent with UTI at this time. Presentation is not consistent with sepsis. Question element of dehydration secondary to poor oral intake resulting in hypotensive episodes. Given patient's persistent confusion medicine consulted for admission for observation. Discussed findings of studies with patient's daughter over the phone who is in agreement with treatment plan.  Final Clinical Impression(s) / ED Diagnoses Final diagnoses:  None    Rx / DC Orders ED Discharge Orders    None       Quintella Reichert, MD 05/09/20 1722

## 2020-05-09 NOTE — H&P (Addendum)
History and Physical    GIORGI DEBRUIN KGM:[id number] DOB: Sep 12, 1941 DOA: 05/09/2020  PCP: Chevis Pretty, FNP  Patient coming from: [place] have personally briefly reviewed patient's old medical records in [place]  Chief Complaint: Syncope  HPI: Dean Harris is a 79 y.o. male with medical history significant of dementia, coronary artery disease, severe carotid artery stenosis status post bilateral carotid endarterectomy, hyperlipidemia, moderate aortic stenosis, depression/anxiety, hypertension brought by EMS to the emergency department for evaluation of syncope and confusion.  Patient is not good historian.  History gathered from charts.  Apparently patient recently diagnosed with UTI on 8/21 and started on ciprofloxacin, today while he was sitting in his wheelchair when he slid to the floor and was unresponsive with some tremors in his arms.  EMS was called and was noted to be have low blood pressure in 64Q systolic.  IV fluids was given and his blood pressure improved, blood sugar was noted to be in 300s.  Patient was brought to the emergency department for further evaluation and management of syncope/seizure/confusion.  I talked to patient's grand daughter Caryl Pina who is also POA-she tells me that patient has declined due to dementia and at baseline patient sometimes talks and answer simple questions.  No history of previous seizure disorder, fever, chills, nausea, vomiting, diarrhea, chest pain, shortness of breath, headache, head trauma, urinary or bowel changes.  No history of smoking, alcohol, licit drug use.  ED Course: Upon arrival to ED: Patient's hypotensive, afebrile with no leukocytosis, CBC shows platelet: 114, CMP shows acute on chronic kidney failure, lactic acid: WNL, troponin, WNL, COVID-19 negative, UA positive for leukocytes and bacteria.  UC, BC, TSH: WNL.  CT head came back negative for acute findings.  Chest x-ray shows mild streaky/tram  tract type opacities in medial lung bases bilaterally which could indicate mild bronchiectasis.  No acute cardiopulmonary disease.  Patient received IV fluid 1500 cc in ED.  [facility] hospitalist consulted for admission for syncope/AMS.  Review of Systems: As per HPI otherwise negative.    Past Medical History:  Diagnosis Date  . Chronic stable angina ([place])   . 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 Depression   . Diabetes mellitus   . Essential hypertension   . Hyperlipidemia with target LDL less than 70   . Memory difficulties   . 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 Pneumonia    x2  . 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 This has helped his memory issues.  . Thrombocytopenia ([place]) 04/29/2014  . Vitamin D deficiency     Past Surgical History:  Procedure Laterality Date  . CAROTID ENDARTERECTOMY    . LEFT HEART CATHETERIZATION WITH CORONARY ANGIOGRAM N/A 02/13/2012   Procedure: LEFT HEART CATHETERIZATION WITH CORONARY ANGIOGRAM;  Surgeon: Candee Furbish, MD;  Location: [facility] CATH LAB;  Service: Cardiovascular: Diffuse mid LAD 50%, 50%pOM1, 50%mRCA -> medical management  . TRANSTHORACIC ECHOCARDIOGRAM  01/2012   EF 55-60%. Mild LVH. No RWMA, mild Aortic Stenosis (mean/peak gradient 13 mmHg/21 mmHg)  . TRANSTHORACIC ECHOCARDIOGRAM  May 2017, May 2019   A) EF 60-65% w/o RWMA. Normal DF for age. Mod AD (mean/peak gradient 24/35 mmHg); b) EF 60-65%, mod LVH, Mod AS (Mean gradient (S): 28 mm  Hg. Peak 43 mmHg  . US CAROTID DOPPLER BILATERAL ([facility] HX) Bilateral 02/05/2016   Stable mild-to-moderate disease with bilateral heterogeneous plaque. RICA - 6-19%, LICA 50-93%. Bilateral subclavian and vertebral arteries normal.     reports that he quit  smoking about 32 years ago. His smoking use included cigarettes. He has never used smokeless tobacco. He reports that he does not drink alcohol and does not use drugs.  Allergies  Allergen Reactions  . Dust Mite Extract Other (See Comments)    Sneezing, coughing  . Mold Extract [Trichophyton] Other (See Comments)    Sneezing, coughing  . Pollen Extract Other (See Comments)    Sneezing, coughing  . Tetracyclines & Related Other (See Comments)    Generic only    Family History  Problem Relation Age of Onset  . Diabetes Mother   . Stroke Mother   . Hypertension Mother   . Hyperlipidemia Mother   . Cancer Father   . Hypertension Brother   . Cancer Other     Prior to Admission medications   Medication Sig Start Date End Date Taking? Authorizing Provider  Accu-Chek Softclix Lancets lancets CHECK BLOOD SUGAR DAILY AND AS NEEDED Dx E11.9 08/17/19   Hassell Done, Mary-Margaret, FNP  aspirin EC 81 MG tablet Take 1 tablet (81 mg total) by mouth daily. 05/20/19   Leonie Man, MD  b complex vitamins tablet Take 1 tablet by mouth daily.    [provider]  Blood Glucose Monitoring Suppl (ACCU-CHEK AVIVA PLUS) w/Device KIT Check blood sugar daily and as needed 08/17/19   Hassell Done, Mary-Margaret, FNP  busPIRone (BUSPAR) 5 MG tablet Take 1 tablet (5 mg total) by mouth 2 (two) times daily with breakfast and lunch. anxiety 03/22/20   Hassell Done, Mary-Margaret, FNP  donepezil (ARICEPT) 10 MG tablet TAKE 1/2 (ONE-HALF) TABLET BY MOUTH AT BEDTIME (Needs to be seen before next refill) 03/22/20   Hassell Done, Mary-Margaret, FNP  Fluticasone-Salmeterol (ADVAIR DISKUS) 100-50 MCG/DOSE AEPB Inhale 1 puff into the lungs every 12 (twelve) hours. 03/22/20   Hassell Done, Mary-Margaret, FNP  glucose blood (ACCU-CHEK AVIVA PLUS) test strip Test blood sugar daily and as needed Dx E11.9 08/17/19   Hassell Done, Mary-Margaret, FNP  Ipratropium-Albuterol (COMBIVENT) 20-100 MCG/ACT AERS respimat Inhale 1 puff into the lungs every 6 (six)  hours. 03/22/20   Hassell Done, Mary-Margaret, FNP  isosorbide mononitrate (IMDUR) 30 MG 24 hr tablet Take 1 tablet (30 mg total) by mouth daily. 03/22/20   Hassell Done, Mary-Margaret, FNP  lisinopril (ZESTRIL) 20 MG tablet Take 1 tablet (20 mg total) by mouth daily. 03/22/20   Hassell Done, Mary-Margaret, FNP  memantine (NAMENDA) 10 MG tablet Take 1 tablet (10 mg total) by mouth 2 (two) times daily. 03/22/20   Hassell Done Mary-Margaret, FNP  nitroGLYCERIN (NITROSTAT) 0.4 MG SL tablet Use 1 tabket under tongue at onset and may repeat every 5 minutes x2 02/15/20   Hassell Done, Mary-Margaret, FNP  sertraline (ZOLOFT) 100 MG tablet Take 2 tablets (200 mg total) by mouth in the morning. 03/22/20   Hassell Done, Mary-Margaret, FNP  Travoprost, BAK Free, (TRAVATAN) 0.004 % SOLN ophthalmic solution Place 1 drop into both eyes at bedtime. 03/22/20   Chevis Pretty, FNP    Physical Exam: Vitals:   05/09/20 1415 05/09/20 1430 05/09/20 1445 05/09/20 1515  BP: (!) 120/49 (!) 91/50 (!) 93/50 (!) 105/51  Pulse: 63 (!) 59 (!) 58 (!) 55  Resp: $Remo'15 12 13 11  'aqGgw$ Temp:      TempSrc:      SpO2: 96% 96%  95% 97%    Constitutional: NAD, calm,, on room air, appears dehydrated, opens his eyes however not following commands Eyes: PERRL, lids and conjunctivae normal ENMT: Mucous membranes are dry. Posterior pharynx clear of any exudate or lesions.Normal dentition.  Neck: normal, supple, no masses, no thyromegaly Respiratory: clear to auscultation bilaterally, no wheezing, no crackles. Normal respiratory effort. No accessory muscle use.  Cardiovascular: Regular rate and rhythm, no murmurs / rubs / gallops. No extremity edema. 2+ pedal pulses. No carotid bruits.  Abdomen: no tenderness, no masses palpated. No hepatosplenomegaly. Bowel sounds positive.  Musculoskeletal: no clubbing / cyanosis. No joint deformity upper and lower extremities. Good ROM, no contractures. Normal muscle tone.  Skin: no rashes, lesions, ulcers. No induration Neurologic: Opens his  eyes however is not following commands.   Labs on Admission: I have personally reviewed following labs and imaging studies  CBC: Recent Labs  Lab 05/09/20 1147  WBC 7.9  NEUTROABS 6.5  HGB 11.7*  HCT 36.6*  MCV 91.3  PLT 114*   Basic Metabolic Panel: Recent Labs  Lab 05/09/20 1147  NA 138  K 4.2  CL 105  CO2 24  GLUCOSE 256*  BUN 26*  CREATININE 1.52*  CALCIUM 8.5*   GFR: CrCl cannot be calculated (Unknown ideal weight.). Liver Function Tests: Recent Labs  Lab 05/09/20 1147  AST 16  ALT 19  ALKPHOS 63  BILITOT 1.4*  PROT 6.0*  ALBUMIN 3.2*   No results for input(s): LIPASE, AMYLASE in the last 168 hours. No results for input(s): AMMONIA in the last 168 hours. Coagulation Profile: No results for input(s): INR, PROTIME in the last 168 hours. Cardiac Enzymes: No results for input(s): CKTOTAL, CKMB, CKMBINDEX, TROPONINI in the last 168 hours. BNP (last 3 results) No results for input(s): PROBNP in the last 8760 hours. HbA1C: No results for input(s): HGBA1C in the last 72 hours. CBG: No results for input(s): GLUCAP in the last 168 hours. Lipid Profile: No results for input(s): CHOL, HDL, LDLCALC, TRIG, CHOLHDL, LDLDIRECT in the last 72 hours. Thyroid Function Tests: No results for input(s): TSH, T4TOTAL, FREET4, T3FREE, THYROIDAB in the last 72 hours. Anemia Panel: No results for input(s): VITAMINB12, FOLATE, FERRITIN, TIBC, IRON, RETICCTPCT in the last 72 hours. Urine analysis:    Component Value Date/Time   COLORURINE YELLOW 05/09/2020 1223   APPEARANCEUR CLEAR 05/09/2020 1223   LABSPEC 1.012 05/09/2020 1223   PHURINE 5.0 05/09/2020 1223   GLUCOSEU NEGATIVE 05/09/2020 1223   HGBUR MODERATE (A) 05/09/2020 1223   BILIRUBINUR NEGATIVE 05/09/2020 1223   KETONESUR NEGATIVE 05/09/2020 1223   PROTEINUR NEGATIVE 05/09/2020 1223   NITRITE NEGATIVE 05/09/2020 1223   LEUKOCYTESUR TRACE (A) 05/09/2020 1223    Radiological Exams on Admission: CT Head Wo  Contrast  Result Date: 05/09/2020 CLINICAL DATA:  Near-syncope. EXAM: CT HEAD WITHOUT CONTRAST TECHNIQUE: Contiguous axial images were obtained from the base of the skull through the vertex without intravenous contrast. COMPARISON:  MRI brain dated January 11, 2020. CT head dated January 10, 2020. FINDINGS: Brain: No evidence of acute infarction, hemorrhage, hydrocephalus, extra-axial collection or mass lesion/mass effect. Stable atrophy and chronic ventriculomegaly. Vascular: Calcified atherosclerosis at the skullbase. No hyperdense vessel. Skull: Normal. Negative for fracture or focal lesion. Sinuses/Orbits: No acute finding. Other: None. IMPRESSION: 1. No acute intracranial abnormality. 2. Stable atrophy and chronic ventriculomegaly. Electronically Signed   By: Obie Dredge M.D.   On: 05/09/2020 13:44   DG Chest Port 1 View  Result Date: 05/09/2020 CLINICAL DATA:  Syncope, altered mental status, hypotension EXAM: PORTABLE CHEST 1 VIEW COMPARISON:  02/16/2020 chest radiograph. FINDINGS: Stable cardiomediastinal silhouette with normal heart size. No pneumothorax. No pleural effusion. No pulmonary edema. Slightly low lung volumes. Mild streaky/tram track type opacities in the medial lung bases bilaterally. IMPRESSION: Mild streaky/tram track type opacities in the medial lung bases bilaterally, which could indicate mild bronchiectasis. No acute cardiopulmonary disease. Electronically Signed   By: Ilona Sorrel M.D.   On: 05/09/2020 12:14    EKG: Independently reviewed.  Sinus rhythm.  No ST elevation or depression noted.  Assessment/Plan Principal Problem:   Syncope Active Problems:   Diabetes (HCC)   Peripheral neuropathy   Essential hypertension, benign   Thrombocytopenia (HCC)   Carotid artery plaque, bilateral; s/p Bilateral CEA   AMS (altered mental status)   Acute on chronic kidney failure (HCC)   Dementia (HCC)    Syncope/AMS: -Differential diagnosis includes: Orthostatic hypotension  versus aortic stenosis versus carotid artery stenosis versus UTI.  History of bilateral carotid endarterectomy -Blood pressure noted to be on lower side. -He is afebrile with no leukocytosis, COVID-19 negative, lactic acid, troponin: WNL, CT head negative for acute findings. -Received IV fluid bolus in ED. -Admit patient at med telemetry bed. -Check orthostatic vitals.  Continue IV hydration. -Last echo done on 5/19 which showed ejection fraction of 55 to 60% with grade 1 diastolic dysfunction and moderate aortic stenosis. -We will repeat transthoracic echo in carotid Doppler ultrasound -We will keep him n.p.o. as he is confused -Consult PT/OT/SLP -Neurochecks -Check TSH, B12, folate, ammonia, ethanol, magnesium level -On fall/aspiration/seizure precautions  Bacteriuria/UTI: UA is positive for leukocytes and bacteria. -Recently started on Cipro at SNF due to blood in his urine on 8/21 -We will start patient on Rocephin empirically.  Follow urine culture result.  AKI on CKD stage IIIA: -Creatinine: 1.52, GFR: 44  -Continue IV fluids.  Hold lisinopril.  Repeat BMP tomorrow a.m.  Thrombocytopenia: Chronic -Platelet: 114.  No signs of active bleeding.  -Repeat CBC tomorrow a.m.  Hypertension: -Currently blood pressure is on lower side.  Continue IV fluids. -Hold Imdur, lisinopril.  Monitor blood pressure closely.  Resume home BP meds once blood pressure is back to baseline  Depression/anxiety/dementia: -Hold BuSpar, Aricept, Namenda, Zoloft-as patient is confused.  Chronic diastolic CHF: Patient appears euvolemic on exam.  Chest x-ray negative for fluid overload. -Hold aspirin, Imdur, lisinopril.  Strict INO's and daily weight.  Monitor signs for fluid overload.  Check electrolytes  DVT prophylaxis: Lovenox/SCD Code Status: Full code-confirmed with patient's granddaughter.  She tells me that she is not ready to make decision now about patient's CODE STATUS. Family Communication:  None present at bedside.  Plan of care discussed with patient in length and he verbalized understanding and agreed with it.  I called patient's granddaughter initially and discussed plan of care and she verbalized understanding.  Disposition Plan: SNF in 2 to 3 days Consults called: None Admission status: Inpatient   Mckinley Jewel MD [facility]  If 7PM-7AM, please contact night-coverage [email or web address] Password [facility]  05/09/2020, 3:25 PM

## 2020-05-09 NOTE — ED Notes (Addendum)
Granddaughter [name] [facility])  Cell - [phone] Work till [employer] - [phone]

## 2020-05-10 ENCOUNTER — Emergency Department (HOSPITAL_COMMUNITY): Payer: Medicare PPO

## 2020-05-10 ENCOUNTER — Observation Stay (HOSPITAL_BASED_OUTPATIENT_CLINIC_OR_DEPARTMENT_OTHER)
Admission: EM | Admit: 2020-05-10 | Discharge: 2020-05-11 | Disposition: A | Discharge status: Home / Self Care | Payer: Medicare PPO | Source: Home / Self Care | Attending: Emergency Medicine | Admitting: Emergency Medicine

## 2020-05-10 ENCOUNTER — Encounter (HOSPITAL_COMMUNITY): Payer: Self-pay

## 2020-05-10 DIAGNOSIS — S0101XA Laceration without foreign body of scalp, initial encounter: Secondary | ICD-10-CM | POA: Diagnosis not present

## 2020-05-10 DIAGNOSIS — S065X0A Traumatic subdural hemorrhage without loss of consciousness, initial encounter: Secondary | ICD-10-CM | POA: Diagnosis not present

## 2020-05-10 DIAGNOSIS — W19XXXA Unspecified fall, initial encounter: Secondary | ICD-10-CM | POA: Diagnosis not present

## 2020-05-10 DIAGNOSIS — J449 Chronic obstructive pulmonary disease, unspecified: Secondary | ICD-10-CM | POA: Insufficient documentation

## 2020-05-10 DIAGNOSIS — I6201 Nontraumatic acute subdural hemorrhage: Secondary | ICD-10-CM | POA: Diagnosis not present

## 2020-05-10 DIAGNOSIS — F03918 Unspecified dementia, unspecified severity, with other behavioral disturbance: Secondary | ICD-10-CM | POA: Diagnosis present

## 2020-05-10 DIAGNOSIS — Z7982 Long term (current) use of aspirin: Secondary | ICD-10-CM | POA: Insufficient documentation

## 2020-05-10 DIAGNOSIS — F329 Major depressive disorder, single episode, unspecified: Secondary | ICD-10-CM | POA: Insufficient documentation

## 2020-05-10 DIAGNOSIS — R456 Violent behavior: Secondary | ICD-10-CM | POA: Diagnosis not present

## 2020-05-10 DIAGNOSIS — Z79899 Other long term (current) drug therapy: Secondary | ICD-10-CM | POA: Insufficient documentation

## 2020-05-10 DIAGNOSIS — H409 Unspecified glaucoma: Secondary | ICD-10-CM | POA: Insufficient documentation

## 2020-05-10 DIAGNOSIS — I5032 Chronic diastolic (congestive) heart failure: Secondary | ICD-10-CM | POA: Insufficient documentation

## 2020-05-10 DIAGNOSIS — I10 Essential (primary) hypertension: Secondary | ICD-10-CM | POA: Diagnosis present

## 2020-05-10 DIAGNOSIS — I110 Hypertensive heart disease with heart failure: Secondary | ICD-10-CM | POA: Insufficient documentation

## 2020-05-10 DIAGNOSIS — E860 Dehydration: Secondary | ICD-10-CM | POA: Diagnosis not present

## 2020-05-10 DIAGNOSIS — E119 Type 2 diabetes mellitus without complications: Secondary | ICD-10-CM | POA: Insufficient documentation

## 2020-05-10 DIAGNOSIS — R410 Disorientation, unspecified: Secondary | ICD-10-CM | POA: Diagnosis not present

## 2020-05-10 DIAGNOSIS — I2510 Atherosclerotic heart disease of native coronary artery without angina pectoris: Secondary | ICD-10-CM | POA: Diagnosis present

## 2020-05-10 DIAGNOSIS — R4182 Altered mental status, unspecified: Secondary | ICD-10-CM | POA: Diagnosis not present

## 2020-05-10 DIAGNOSIS — E785 Hyperlipidemia, unspecified: Secondary | ICD-10-CM | POA: Insufficient documentation

## 2020-05-10 DIAGNOSIS — Y9389 Activity, other specified: Secondary | ICD-10-CM | POA: Insufficient documentation

## 2020-05-10 DIAGNOSIS — S065XAA Traumatic subdural hemorrhage with loss of consciousness status unknown, initial encounter: Secondary | ICD-10-CM

## 2020-05-10 DIAGNOSIS — M2550 Pain in unspecified joint: Secondary | ICD-10-CM | POA: Diagnosis not present

## 2020-05-10 DIAGNOSIS — I5189 Other ill-defined heart diseases: Secondary | ICD-10-CM | POA: Diagnosis present

## 2020-05-10 DIAGNOSIS — I619 Nontraumatic intracerebral hemorrhage, unspecified: Secondary | ICD-10-CM | POA: Diagnosis not present

## 2020-05-10 DIAGNOSIS — R9082 White matter disease, unspecified: Secondary | ICD-10-CM | POA: Diagnosis not present

## 2020-05-10 DIAGNOSIS — R55 Syncope and collapse: Secondary | ICD-10-CM | POA: Diagnosis not present

## 2020-05-10 DIAGNOSIS — Z7401 Bed confinement status: Secondary | ICD-10-CM | POA: Diagnosis not present

## 2020-05-10 DIAGNOSIS — F0391 Unspecified dementia with behavioral disturbance: Secondary | ICD-10-CM | POA: Diagnosis present

## 2020-05-10 DIAGNOSIS — Y999 Unspecified external cause status: Secondary | ICD-10-CM | POA: Insufficient documentation

## 2020-05-10 DIAGNOSIS — W01190A Fall on same level from slipping, tripping and stumbling with subsequent striking against furniture, initial encounter: Secondary | ICD-10-CM | POA: Insufficient documentation

## 2020-05-10 DIAGNOSIS — F0390 Unspecified dementia without behavioral disturbance: Secondary | ICD-10-CM | POA: Insufficient documentation

## 2020-05-10 DIAGNOSIS — S065X9A Traumatic subdural hemorrhage with loss of consciousness of unspecified duration, initial encounter: Secondary | ICD-10-CM

## 2020-05-10 DIAGNOSIS — F339 Major depressive disorder, recurrent, unspecified: Secondary | ICD-10-CM | POA: Diagnosis present

## 2020-05-10 DIAGNOSIS — Y9289 Other specified places as the place of occurrence of the external cause: Secondary | ICD-10-CM | POA: Insufficient documentation

## 2020-05-10 DIAGNOSIS — R404 Transient alteration of awareness: Secondary | ICD-10-CM | POA: Diagnosis not present

## 2020-05-10 DIAGNOSIS — M542 Cervicalgia: Secondary | ICD-10-CM | POA: Diagnosis not present

## 2020-05-10 HISTORY — DX: Unspecified glaucoma: H40.9

## 2020-05-10 HISTORY — DX: Other ill-defined heart diseases: I51.89

## 2020-05-10 LAB — CBC WITH DIFFERENTIAL/PLATELET
Abs Immature Granulocytes: 0.04 10*3/uL (ref 0.00–0.07)
Basophils Absolute: 0.1 10*3/uL (ref 0.0–0.1)
Basophils Relative: 1 %
Eosinophils Absolute: 0.1 10*3/uL (ref 0.0–0.5)
Eosinophils Relative: 2 %
HCT: 36.4 % — ABNORMAL LOW (ref 39.0–52.0)
Hemoglobin: 12.1 g/dL — ABNORMAL LOW (ref 13.0–17.0)
Immature Granulocytes: 1 %
Lymphocytes Relative: 23 %
Lymphs Abs: 1.5 10*3/uL (ref 0.7–4.0)
MCH: 29.5 pg (ref 26.0–34.0)
MCHC: 33.2 g/dL (ref 30.0–36.0)
MCV: 88.8 fL (ref 80.0–100.0)
Monocytes Absolute: 0.4 10*3/uL (ref 0.1–1.0)
Monocytes Relative: 6 %
Neutro Abs: 4.5 10*3/uL (ref 1.7–7.7)
Neutrophils Relative %: 67 %
Platelets: 143 10*3/uL — ABNORMAL LOW (ref 150–400)
RBC: 4.1 MIL/uL — ABNORMAL LOW (ref 4.22–5.81)
RDW: 12.6 % (ref 11.5–15.5)
WBC: 6.6 10*3/uL (ref 4.0–10.5)
nRBC: 0 % (ref 0.0–0.2)

## 2020-05-10 LAB — COMPREHENSIVE METABOLIC PANEL
ALT: 15 U/L (ref 0–44)
AST: 18 U/L (ref 15–41)
Albumin: 2.8 g/dL — ABNORMAL LOW (ref 3.5–5.0)
Alkaline Phosphatase: 56 U/L (ref 38–126)
Anion gap: 12 (ref 5–15)
BUN: 24 mg/dL — ABNORMAL HIGH (ref 8–23)
CO2: 20 mmol/L — ABNORMAL LOW (ref 22–32)
Calcium: 8.3 mg/dL — ABNORMAL LOW (ref 8.9–10.3)
Chloride: 107 mmol/L (ref 98–111)
Creatinine, Ser: 1.1 mg/dL (ref 0.61–1.24)
GFR calc Af Amer: >60 mL/min (ref >60)
GFR calc non Af Amer: >60 mL/min (ref >60)
Glucose, Bld: 155 mg/dL — ABNORMAL HIGH (ref 70–99)
Potassium: 4.2 mmol/L (ref 3.5–5.1)
Sodium: 139 mmol/L (ref 135–145)
Total Bilirubin: 1.3 mg/dL — ABNORMAL HIGH (ref 0.3–1.2)
Total Protein: 5.3 g/dL — ABNORMAL LOW (ref 6.5–8.1)

## 2020-05-10 LAB — BASIC METABOLIC PANEL
Anion gap: 10 (ref 5–15)
BUN: 21 mg/dL (ref 8–23)
CO2: 22 mmol/L (ref 22–32)
Calcium: 8.6 mg/dL — ABNORMAL LOW (ref 8.9–10.3)
Chloride: 106 mmol/L (ref 98–111)
Creatinine, Ser: 1.12 mg/dL (ref 0.61–1.24)
GFR calc Af Amer: >60 mL/min (ref >60)
GFR calc non Af Amer: >60 mL/min (ref >60)
Glucose, Bld: 142 mg/dL — ABNORMAL HIGH (ref 70–99)
Potassium: 4.1 mmol/L (ref 3.5–5.1)
Sodium: 138 mmol/L (ref 135–145)

## 2020-05-10 LAB — URINE CULTURE: Culture: <10000 — AB

## 2020-05-10 LAB — CBC
HCT: 34.6 % — ABNORMAL LOW (ref 39.0–52.0)
Hemoglobin: 11.4 g/dL — ABNORMAL LOW (ref 13.0–17.0)
MCH: 29.8 pg (ref 26.0–34.0)
MCHC: 32.9 g/dL (ref 30.0–36.0)
MCV: 90.3 fL (ref 80.0–100.0)
Platelets: 92 10*3/uL — ABNORMAL LOW (ref 150–400)
RBC: 3.83 MIL/uL — ABNORMAL LOW (ref 4.22–5.81)
RDW: 12.9 % (ref 11.5–15.5)
WBC: 6.2 10*3/uL (ref 4.0–10.5)
nRBC: 0 % (ref 0.0–0.2)

## 2020-05-10 LAB — CBG MONITORING, ED: Glucose-Capillary: 137 mg/dL — ABNORMAL HIGH (ref 70–99)

## 2020-05-10 LAB — MRSA PCR SCREENING: MRSA by PCR: NEGATIVE

## 2020-05-10 LAB — GLUCOSE, CAPILLARY: Glucose-Capillary: 153 mg/dL — ABNORMAL HIGH (ref 70–99)

## 2020-05-10 MED ORDER — ACETAMINOPHEN 325 MG PO TABS: 650.0000 mg | ORAL_TABLET | Freq: Four times a day (QID) | ORAL | Status: DC | PRN

## 2020-05-10 MED ORDER — LEVETIRACETAM 500 MG PO TABS
500.0000 mg | ORAL_TABLET | Freq: Two times a day (BID) | ORAL | Status: DC
  Administered 2020-05-10 – 2020-05-11 (×3): 500 mg via ORAL
  Filled 2020-05-10 (×3): qty 1

## 2020-05-10 MED ORDER — ACETAMINOPHEN 650 MG RE SUPP: 650.0000 mg | Freq: Four times a day (QID) | RECTAL | Status: DC | PRN

## 2020-05-10 MED ORDER — LEVETIRACETAM IN NACL 1000 MG/100ML IV SOLN
1000.0000 mg | Freq: Once | INTRAVENOUS | Status: AC
  Administered 2020-05-10: 1000 mg via INTRAVENOUS
  Filled 2020-05-10: qty 100

## 2020-05-10 NOTE — Progress Notes (Signed)
0300 - pt arrived on 4NP. Pt alert to self. Pt won't answer any other orientation questions. VS stable. IV intact and infusing. Sacral foam applied. Pt agitated and shaking his bed during assessment. Pt oriented to unit. Bed in lowest setting and bed alarm on. Call bell in reach.

## 2020-05-10 NOTE — Care Management Obs Status (Signed)
MEDICARE OBSERVATION STATUS NOTIFICATION   Patient Details  Name: Dean Harris MRN: [id number] Date of Birth: January 12, 1941   Medicare Observation Status Notification Given:  Yes    Darrold Span, RN 05/10/2020, 11:17 AM

## 2020-05-10 NOTE — ED Notes (Signed)
Pt refusing to allow temp check orally and axillary.

## 2020-05-10 NOTE — NC FL2 (Signed)
[place] MEDICAID FL2 LEVEL OF CARE SCREENING TOOL     IDENTIFICATION  Patient Name: Dean Harris Birthdate: 1940/10/10 Sex: male Admission Date (Current Location): 05/09/2020  [facility] and [place] Number:  Producer, television/film/video and Address:  [facility] [place], [address], [place], [place] [address]      Provider Number: [id number]  Attending Physician Name and Address:  Hughie Closs, MD  Relative Name and Phone Number:       Current Level of Care: Hospital Recommended Level of Care: Assisted Living Facility, Memory Care Prior Approval Number:    Date Approved/Denied:   PASRR Number:    Discharge Plan: Other (Comment) (ALF)    Current Diagnoses: Patient Active Problem List   Diagnosis Date Noted  . Acute on chronic kidney failure (HCC)   . Dementia (HCC)   . AMS (altered mental status) 01/11/2020  . CAP (community acquired pneumonia) 01/10/2020  . Weight loss, non-intentional 05/20/2019  . Depression, recurrent (HCC) 12/01/2017  . Memory disorder 12/01/2017  . Carotid artery plaque, bilateral; s/p Bilateral CEA 07/24/2016  . Syncope 01/28/2016  . Moderate aortic stenosis 01/28/2016  . Chronic stable angina (HCC) 01/28/2016  . BMI 30.0-30.9,adult 07/09/2015  . Thrombocytopenia (HCC) 04/29/2014  . Essential hypertension, benign 03/03/2014  . Coronary artery disease, non-occlusive   . Sleep apnea 11/30/2010  . Gout 11/30/2010  . Hyperlipidemia associated with type 2 diabetes mellitus (HCC) 11/30/2010  . Rosacea 11/30/2010  . COPD (chronic obstructive pulmonary disease) (HCC) 11/30/2010  . ED (erectile dysfunction) 11/30/2010  . Peripheral neuropathy 11/30/2010  . Diabetes (HCC) 09/16/1999    Orientation RESPIRATION BLADDER Height & Weight     Self  Normal Incontinent Weight: 147 lb 0.8 oz (66.7 kg) Height:  5\' 2"  (157.5 cm)  BEHAVIORAL SYMPTOMS/MOOD NEUROLOGICAL BOWEL NUTRITION STATUS      Incontinent Diet (please see discharge  summary)  AMBULATORY STATUS COMMUNICATION OF NEEDS Skin                               Personal Care Assistance Level of Assistance  Bathing, Feeding, Dressing Bathing Assistance: Limited assistance Feeding assistance: Limited assistance Dressing Assistance: Limited assistance     Functional Limitations Info  Sight, Hearing, Speech Sight Info: Adequate Hearing Info: Adequate Speech Info: Adequate    SPECIAL CARE FACTORS FREQUENCY                       Contractures Contractures Info: Not present    Additional Factors Info  Code Status, Allergies Code Status Info: FULL Allergies Info: Dust Mite Extract,Mold Extract,Pollen Extract,Tetracyclines & Related           Current Medications (05/10/2020):  This is the current hospital active medication list Current Facility-Administered Medications  Medication Dose Route Frequency Provider Last Rate Last Admin  . 0.9 %  sodium chloride infusion   Intravenous Continuous Pahwani, Rinka R, MD 100 mL/hr at 05/09/20 1639 New Bag at 05/09/20 1639  . acetaminophen (TYLENOL) tablet 650 mg  650 mg Oral Q6H PRN Pahwani, Rinka R, MD       Or  . acetaminophen (TYLENOL) suppository 650 mg  650 mg Rectal Q6H PRN Pahwani, Rinka R, MD      . cefTRIAXone (ROCEPHIN) 1 g in sodium chloride 0.9 % 100 mL IVPB  1 g Intravenous Q24H Pahwani, 05/11/20, MD   Stopped at 05/09/20 1823  . enoxaparin (  LOVENOX) injection 40 mg  40 mg Subcutaneous Q24H Pahwani, Rinka R, MD   40 mg at 05/09/20 1641  . ondansetron (ZOFRAN) tablet 4 mg  4 mg Oral Q6H PRN Pahwani, Rinka R, MD       Or  . ondansetron (ZOFRAN) injection 4 mg  4 mg Intravenous Q6H PRN Pahwani, Rinka R, MD      . sodium chloride flush (NS) 0.9 % injection 3 mL  3 mL Intravenous Q12H Pahwani, Rinka R, MD   3 mL at 05/09/20 1640     Discharge Medications: Please see discharge summary for a list of discharge medications.  Relevant Imaging Results:  Relevant Lab Results:   Additional  Information SSN [id number]  Eduard Roux, LCSWA

## 2020-05-10 NOTE — Discharge Summary (Signed)
Physician Discharge Summary  OVADIA LOPP WIO:[id number] DOB: [birth year] DOA: 05/09/2020  PCP: Chevis Pretty, FNP  Admit date: 05/09/2020 Discharge date: 05/10/2020  Admitted From: SNF Disposition: SNF  Recommendations for Outpatient Follow-up:  1. Follow up with PCP in 1-2 weeks 2. Please obtain BMP/CBC in one week 3. Please follow up with your PCP on the following pending results: Unresulted Labs (From admission, onward)          Start     Ordered   05/16/20 0500  Creatinine, serum  (enoxaparin (LOVENOX)    CrCl >/= 30 ml/min)  Weekly,   R     Comments: while on enoxaparin therapy    05/09/20 1524   05/09/20 1147  Urine culture  ONCE - STAT,   STAT        05/09/20 1147           Home Health: None Equipment/Devices: None  Discharge Condition: Stable CODE STATUS: Full code Diet recommendation: Cardiac  Subjective: Seen and examined.  Sitting in the chair and completely alert but not oriented.  Granddaughter at the bedside confirms that this is his baseline.  HPI: Dean Harris is a 79 y.o. male with medical history significant of dementia, coronary artery disease, severe carotid artery stenosis status post bilateral carotid endarterectomy, hyperlipidemia, moderate aortic stenosis, depression/anxiety, hypertension brought by EMS to the emergency department for evaluation of syncope and confusion.  Patient is not good historian.  History gathered from charts.  Apparently patient recently diagnosed with UTI on 8/21 and started on ciprofloxacin, today while he was sitting in his wheelchair when he slid to the floor and was unresponsive with some tremors in his arms.  EMS was called and was noted to be have low blood pressure in 42A systolic.  IV fluids was given and his blood pressure improved, blood sugar was noted to be in 300s.  Patient was brought to the emergency department for further evaluation and management of syncope/seizure/confusion.  I talked to patient's  grand daughter Caryl Pina who is also POA-she tells me that patient has declined due to dementia and at baseline patient sometimes talks and answer simple questions.  No history of previous seizure disorder, fever, chills, nausea, vomiting, diarrhea, chest pain, shortness of breath, headache, head trauma, urinary or bowel changes.  No history of smoking, alcohol, licit drug use.  ED Course: Upon arrival to ED: Patient's hypotensive, afebrile with no leukocytosis, CBC shows platelet: 114, CMP shows acute on chronic kidney failure, lactic acid: WNL, troponin, WNL, COVID-19 negative, UA positive for leukocytes and bacteria.  UC, BC, TSH: WNL.  CT head came back negative for acute findings.  Chest x-ray shows mild streaky/tram tract type opacities in medial lung bases bilaterally which could indicate mild bronchiectasis.  No acute cardiopulmonary disease.  Patient received IV fluid 1500 cc in ED.  [facility] hospitalist consulted for admission for syncope/AMS.  Brief/Interim Summary: Patient was admitted to the hospital due to syncope as well as acute encephalopathy.  Reportedly, his systolic blood pressure was 80s at the time EMS arrived.  He received some IV fluids.  His blood pressure has remained much better since then.  He also came in with AKI.  All of these signs indicate dehydration resulting in orthostatic hypotension and likely the cause of his syncope.  He also was presumed to have UTI for which he was continued on Rocephin however his UA has only positive leukoesterase.  Reportedly, he was diagnosed with UTI on 05/05/2020 at his nursing  home and he has been prescribed ciprofloxacin since then.  Based on current UA with only positive leukoesterase, I doubt that he still has any UTI.  Even if he had 5 days ago, he already has received 5 days worth of antibiotics.  No further antibiotics are indicated.  He underwent Doppler carotid which did not show significant stenosis bilaterally and also had transthoracic  echo which showed normal ejection fraction with no wall motion abnormality and no significant valvular heart disease but grade 1 diastolic dysfunction, no change from his previous echo.  He was hydrated and his AKI is resolved his renal function is back to normal.  Patient is alert, sitting in the recliner but not oriented.  Granddaughter at the bedside confirms that this is his baseline.  I highly recommend that in order to prevent such episodes in future, he needs frequent reminders at his current memory care unit to keep himself hydrated as patients with dementia, advanced age tend to forget to drink and eat.  He is going to be discharged back to his nursing home today in stable condition.  This is in agreement with the granddaughter who is at the bedside.  Discharge Diagnoses:  Principal Problem:   Syncope Active Problems:   Diabetes ([place])   Peripheral neuropathy   Essential hypertension, benign   Thrombocytopenia ([facility])   Carotid artery plaque, bilateral; s/p Bilateral CEA   AMS (altered mental status)   Acute on chronic kidney failure ([place])   Dementia ([place])    Discharge Instructions   Allergies as of 05/10/2020      Reactions   Dust Mite Extract Other (See Comments)   Sneezing, coughing   Mold Extract [trichophyton] Other (See Comments)   Sneezing, coughing   Pollen Extract Other (See Comments)   Sneezing, coughing   Tetracyclines & Related Other (See Comments)   Generic only      Medication List    STOP taking these medications   Accu-Chek Aviva Plus test strip Generic drug: glucose blood   Accu-Chek Aviva Plus w/Device Kit   Accu-Chek Softclix Lancets lancets   b complex vitamins tablet   busPIRone 5 MG tablet Commonly known as: BUSPAR   Fluticasone-Salmeterol 100-50 MCG/DOSE Aepb Commonly known as: Advair Diskus   Ipratropium-Albuterol 20-100 MCG/ACT Aers respimat Commonly known as: COMBIVENT   lisinopril 20 MG tablet Commonly known as: ZESTRIL     TAKE  these medications   acetaminophen 500 MG tablet Commonly known as: TYLENOL Take 500 mg by mouth every 6 (six) hours as needed (for fever 99.5-101 F, minor headaches or discomfort- report any fever >101 F, severe headache or discomfort).   aspirin EC 81 MG tablet Take 1 tablet (81 mg total) by mouth daily.   clonazePAM 0.5 MG tablet Commonly known as: KLONOPIN Take 0.5 mg by mouth 2 (two) times daily as needed for anxiety.   donepezil 10 MG tablet Commonly known as: ARICEPT TAKE 1/2 (ONE-HALF) TABLET BY MOUTH AT BEDTIME (Needs to be seen before next refill) What changed:   how much to take  how to take this  when to take this  additional instructions   isosorbide mononitrate 30 MG 24 hr tablet Commonly known as: IMDUR Take 1 tablet (30 mg total) by mouth daily.   loperamide 2 MG capsule Commonly known as: IMODIUM Take 2 mg by mouth as needed (with each loose stool/diarrhea- Max of 8 doses/24 hours- REPORT ANY BLOOD).   memantine 10 MG tablet Commonly known as: NAMENDA Take 1  tablet (10 mg total) by mouth 2 (two) times daily.   Mi-Acid 200-200-20 MG/5ML suspension Generic drug: alum & mag hydroxide-simeth Take 30 mLs by mouth every 6 (six) hours as needed for indigestion or heartburn.   Milk of Magnesia 400 MG/5ML suspension Generic drug: magnesium hydroxide Take 30 mLs by mouth at bedtime as needed for mild constipation.   nitroGLYCERIN 0.4 MG SL tablet Commonly known as: NITROSTAT Use 1 tabket under tongue at onset and may repeat every 5 minutes x2 What changed:   how much to take  how to take this  when to take this  reasons to take this  additional instructions   Robafen 100 MG/5ML syrup Generic drug: guaifenesin Take 200 mg by mouth every 6 (six) hours as needed for cough.   sertraline 100 MG tablet Commonly known as: ZOLOFT Take 2 tablets (200 mg total) by mouth in the morning.   Travoprost (BAK Free) 0.004 % Soln ophthalmic solution Commonly  known as: TRAVATAN Place 1 drop into both eyes at bedtime.   Triple Antibiotic [phone] Oint Apply 1 application topically See admin instructions. Apply to minor skin tears or abrasions after cleansing with normal saline- cover with Band-Aids or gauze & tape       Follow-up Information    Hassell Done, Mary-Margaret, FNP Follow up in 1 week(s).   Specialty: Family Medicine Contact information: [address] [place]  [address] [phone]              Allergies  Allergen Reactions  . Dust Mite Extract Other (See Comments)    Sneezing, coughing  . Mold Extract [Trichophyton] Other (See Comments)    Sneezing, coughing  . Pollen Extract Other (See Comments)    Sneezing, coughing  . Tetracyclines & Related Other (See Comments)    Generic only    Consultations: None   Procedures/Studies: CT Head Wo Contrast  Result Date: 05/09/2020 CLINICAL DATA:  Near-syncope. EXAM: CT HEAD WITHOUT CONTRAST TECHNIQUE: Contiguous axial images were obtained from the base of the skull through the vertex without intravenous contrast. COMPARISON:  MRI brain dated January 11, 2020. CT head dated January 10, 2020. FINDINGS: Brain: No evidence of acute infarction, hemorrhage, hydrocephalus, extra-axial collection or mass lesion/mass effect. Stable atrophy and chronic ventriculomegaly. Vascular: Calcified atherosclerosis at the skullbase. No hyperdense vessel. Skull: Normal. Negative for fracture or focal lesion. Sinuses/Orbits: No acute finding. Other: None. IMPRESSION: 1. No acute intracranial abnormality. 2. Stable atrophy and chronic ventriculomegaly. Electronically Signed   By: Titus Dubin M.D.   On: 05/09/2020 13:44   DG Chest Port 1 View  Result Date: 05/09/2020 CLINICAL DATA:  Syncope, altered mental status, hypotension EXAM: PORTABLE CHEST 1 VIEW COMPARISON:  02/16/2020 chest radiograph. FINDINGS: Stable cardiomediastinal silhouette with normal heart size. No pneumothorax. No pleural  effusion. No pulmonary edema. Slightly low lung volumes. Mild streaky/tram track type opacities in the medial lung bases bilaterally. IMPRESSION: Mild streaky/tram track type opacities in the medial lung bases bilaterally, which could indicate mild bronchiectasis. No acute cardiopulmonary disease. Electronically Signed   By: Ilona Sorrel M.D.   On: 05/09/2020 12:14   ECHOCARDIOGRAM COMPLETE  Result Date: 05/09/2020    ECHOCARDIOGRAM REPORT   Patient Name:   EATHON VALADE Date of Exam: 05/09/2020 Medical Rec #:  [id number]      Height:       62.0 in Accession #:    [id number]     Weight:       140.4 lb Date of  Birth:  [birth year]     BSA:          1.645 m Patient Age:    63 years       BP:           146/69 mmHg Patient Gender: M              HR:           78 bpm. Exam Location:  Inpatient Procedure: 2D Echo Indications:    Syncope R55  History:        Patient has prior history of Echocardiogram examinations, most                 recent 01/20/2019. CAD; Risk Factors:Hypertension and Diabetes.  Sonographer:    Mikki Santee RDCS (AE) Referring Phys: [id number] Mckinley Jewel  Sonographer Comments: Restricted Mobility. IMPRESSIONS  1. Left ventricular ejection fraction, by estimation, is 60 to 65%. The left ventricle has normal function. The left ventricle has no regional wall motion abnormalities. Left ventricular diastolic parameters are consistent with Grade I diastolic dysfunction (impaired relaxation).  2. Right ventricular systolic function is normal. The right ventricular size is normal. Tricuspid regurgitation signal is inadequate for assessing PA pressure.  3. The mitral valve is grossly normal. No evidence of mitral valve regurgitation. No evidence of mitral stenosis.  4. The aortic valve is tricuspid. Aortic valve regurgitation is not visualized. Moderate aortic valve stenosis. Aortic valve area, by VTI measures 1.02 cm. Aortic valve mean gradient measures 32.0 mmHg. Aortic valve Vmax measures 3.50 m/s.   5. The inferior vena cava is normal in size with greater than 50% respiratory variability, suggesting right atrial pressure of 3 mmHg. Comparison(s): No significant change from prior study. Aortic stenosis remains moderate. FINDINGS  Left Ventricle: Left ventricular ejection fraction, by estimation, is 60 to 65%. The left ventricle has normal function. The left ventricle has no regional wall motion abnormalities. The left ventricular internal cavity size was normal in size. There is  no left ventricular hypertrophy. Left ventricular diastolic parameters are consistent with Grade I diastolic dysfunction (impaired relaxation). Right Ventricle: The right ventricular size is normal. No increase in right ventricular wall thickness. Right ventricular systolic function is normal. Tricuspid regurgitation signal is inadequate for assessing PA pressure. Left Atrium: Left atrial size was normal in size. Right Atrium: Right atrial size was normal in size. Pericardium: There is no evidence of pericardial effusion. Mitral Valve: The mitral valve is grossly normal. No evidence of mitral valve regurgitation. No evidence of mitral valve stenosis. Tricuspid Valve: The tricuspid valve is grossly normal. Tricuspid valve regurgitation is not demonstrated. No evidence of tricuspid stenosis. Aortic Valve: The aortic valve is tricuspid. . There is severe thickening and severe calcifcation of the aortic valve. Aortic valve regurgitation is not visualized. Moderate aortic stenosis is present. There is severe thickening of the aortic valve. There is severe calcifcation of the aortic valve. Aortic valve mean gradient measures 32.0 mmHg. Aortic valve peak gradient measures 49.0 mmHg. Aortic valve area, by VTI measures 1.02 cm. Pulmonic Valve: The pulmonic valve was grossly normal. Pulmonic valve regurgitation is not visualized. No evidence of pulmonic stenosis. Aorta: The aortic root and ascending aorta are structurally normal, with no  evidence of dilitation. Venous: The inferior vena cava is normal in size with greater than 50% respiratory variability, suggesting right atrial pressure of 3 mmHg. IAS/Shunts: The interatrial septum appears to be lipomatous. The atrial septum is grossly normal.  LEFT VENTRICLE PLAX  2D LVIDd:         3.75 cm  Diastology LVIDs:         2.70 cm  LV e' lateral:   8.59 cm/s LV PW:         1.00 cm  LV E/e' lateral: 11.4 LV IVS:        1.00 cm  LV e' medial:    7.72 cm/s LVOT diam:     2.20 cm  LV E/e' medial:  12.7 LV SV:         94 LV SV Index:   57 LVOT Area:     3.80 cm  RIGHT VENTRICLE RV S prime:     14.40 cm/s TAPSE (M-mode): 2.1 cm LEFT ATRIUM             Index       RIGHT ATRIUM           Index LA diam:        3.45 cm 2.10 cm/m  RA Area:     16.50 cm LA Vol (A2C):   77.0 ml 46.81 ml/m RA Volume:   43.10 ml  26.20 ml/m LA Vol (A4C):   48.4 ml 29.43 ml/m LA Biplane Vol: 63.6 ml 38.67 ml/m  AORTIC VALVE AV Area (Vmax):    0.99 cm AV Area (Vmean):   0.98 cm AV Area (VTI):     1.02 cm AV Vmax:           350.00 cm/s AV Vmean:          263.500 cm/s AV VTI:            0.920 m AV Peak Grad:      49.0 mmHg AV Mean Grad:      32.0 mmHg LVOT Vmax:         90.90 cm/s LVOT Vmean:        67.600 cm/s LVOT VTI:          0.247 m LVOT/AV VTI ratio: 0.27  AORTA Ao Root diam: 3.25 cm MITRAL VALVE MV Area (PHT): 2.83 cm    SHUNTS MV Decel Time: 268 msec    Systemic VTI:  0.25 m MV E velocity: 97.90 cm/s  Systemic Diam: 2.20 cm MV A velocity: 90.00 cm/s MV E/A ratio:  1.09 Eleonore Chiquito MD Electronically signed by Eleonore Chiquito MD Signature Date/Time: 05/09/2020/5:16:06 PM    Final    VAS US CAROTID  Result Date: 05/09/2020 Carotid Arterial Duplex Study Indications:  Syncope and Weakness. Risk Factors: Hyperlipidemia, Diabetes, coronary artery disease. Performing Technologist: Griffin Basil RCT RDMS  Examination Guidelines: A complete evaluation includes B-mode imaging, spectral Doppler, color Doppler, and power Doppler  as needed of all accessible portions of each vessel. Bilateral testing is considered an integral part of a complete examination. Limited examinations for reoccurring indications may be performed as noted.  Right Carotid Findings: +----------+--------+--------+--------+------------------+------------------+           PSV cm/sEDV cm/sStenosisPlaque DescriptionComments           +----------+--------+--------+--------+------------------+------------------+ CCA Prox  106     15                                                   +----------+--------+--------+--------+------------------+------------------+ CCA Distal90      11  focal and smooth  intimal thickening +----------+--------+--------+--------+------------------+------------------+ ICA Prox  98      21      1-39%                     intimal thickening +----------+--------+--------+--------+------------------+------------------+ ICA Distal96      17                                                   +----------+--------+--------+--------+------------------+------------------+ ECA       97      1                                                    +----------+--------+--------+--------+------------------+------------------+ +----------+--------+-------+--------+-------------------+           PSV cm/sEDV cmsDescribeArm Pressure (mmHG) +----------+--------+-------+--------+-------------------+ Subclavian182                                        +----------+--------+-------+--------+-------------------+ +---------+--------+--------+---------+ VertebralPSV cm/sEDV cm/sAntegrade +---------+--------+--------+---------+  Left Carotid Findings: +----------+--------+--------+--------+------------------+------------------+           PSV cm/sEDV cm/sStenosisPlaque DescriptionComments           +----------+--------+--------+--------+------------------+------------------+ CCA Prox  93      29                                                    +----------+--------+--------+--------+------------------+------------------+ CCA Distal102     18                                intimal thickening +----------+--------+--------+--------+------------------+------------------+ ICA Prox  288     26      40-59%  focal and smooth  intimal thickening +----------+--------+--------+--------+------------------+------------------+ ICA Distal134     20                                                   +----------+--------+--------+--------+------------------+------------------+ ECA       170     3                                                    +----------+--------+--------+--------+------------------+------------------+ +----------+--------+--------+--------+-------------------+           PSV cm/sEDV cm/sDescribeArm Pressure (mmHG) +----------+--------+--------+--------+-------------------+ Subclavian122     16                                  +----------+--------+--------+--------+-------------------+ +---------+--------+--+--------+--+---------+ VertebralPSV cm/s99EDV cm/s23Antegrade +---------+--------+--+--------+--+---------+   Summary: Right Carotid: Velocities in the right ICA are consistent with a 1-39% stenosis. Left Carotid: Velocities in the left ICA are consistent  with a 40-59% stenosis. Vertebrals: Bilateral vertebral arteries demonstrate antegrade flow. *See table(s) above for measurements and observations.     Preliminary       Discharge Exam: Vitals:   05/10/20 0302 05/10/20 0817  BP: (!) 153/61 (!) 157/64  Pulse: 65 73  Resp: 16 14  Temp: 98.1 F (36.7 C) 98 F (36.7 C)  SpO2: 98% 98%   Vitals:   05/10/20 0145 05/10/20 0302 05/10/20 0500 05/10/20 0817  BP: (!) 100/56 (!) 153/61  (!) 157/64  Pulse: (!) 56 65  73  Resp: '13 16  14  ' Temp:  98.1 F (36.7 C)  98 F (36.7 C)  TempSrc:  Axillary  Oral  SpO2: 95% 98%  98%  Weight:   66.7 kg    Height:        General: Pt is alert, awake, not in acute distress Cardiovascular: RRR, S1/S2 +, no rubs, no gallops Respiratory: CTA bilaterally, no wheezing, no rhonchi Abdominal: Soft, NT, ND, bowel sounds + Extremities: no edema, no cyanosis    The results of significant diagnostics from this hospitalization (including imaging, microbiology, ancillary and laboratory) are listed below for reference.     Microbiology: Recent Results (from the past 240 hour(s))  Culture, blood (single)     Status: None (Preliminary result)   Collection Time: 05/09/20 12:04 PM   Specimen: Site Not Specified; Blood  Result Value Ref Range Status   Specimen Description SITE NOT SPECIFIED  Final   Special Requests   Final    BOTTLES DRAWN AEROBIC AND ANAEROBIC Blood Culture results may not be optimal due to an inadequate volume of blood received in culture bottles   Culture   Final    NO GROWTH < 24 HOURS Performed at [facility] [address], [place], [place] [address]    Report Status PENDING  Incomplete  SARS Coronavirus 2 by RT PCR (hospital order, performed in [facility] hospital lab) Nasopharyngeal Nasopharyngeal Swab     Status: None   Collection Time: 05/09/20 12:11 PM   Specimen: Nasopharyngeal Swab  Result Value Ref Range Status   SARS Coronavirus 2 NEGATIVE NEGATIVE Final    Comment: (NOTE) SARS-CoV-2 target nucleic acids are NOT DETECTED.  The SARS-CoV-2 RNA is generally detectable in upper and lower respiratory specimens during the acute phase of infection. The lowest concentration of SARS-CoV-2 viral copies this assay can detect is 250 copies / mL. A negative result does not preclude SARS-CoV-2 infection and should not be used as the sole basis for treatment or other patient management decisions.  A negative result may occur with improper specimen collection / handling, submission of specimen other than nasopharyngeal swab, presence of viral mutation(s) within  the areas targeted by this assay, and inadequate number of viral copies (<250 copies / mL). A negative result must be combined with clinical observations, patient history, and epidemiological information.  Fact Sheet for Patients:   StrictlyIdeas.no  Fact Sheet for Healthcare Providers: BankingDealers.co.za  This test is not yet approved or  cleared by the [place] FDA and has been authorized for detection and/or diagnosis of SARS-CoV-2 by FDA under an Emergency Use Authorization (EUA).  This EUA will remain in effect (meaning this test can be used) for the duration of the COVID-19 declaration under Section 564(b)(1) of the Act, 21 U.S.C. section 360bbb-3(b)(1), unless the authorization is terminated or revoked sooner.  Performed at [facility], [place] [address], [place], [place] [address]   MRSA  PCR Screening     Status: None   Collection Time: 05/10/20  3:30 AM   Specimen: Nasal Mucosa; Nasopharyngeal  Result Value Ref Range Status   MRSA by PCR NEGATIVE NEGATIVE Final    Comment:        The GeneXpert MRSA Assay (FDA approved for NASAL specimens only), is one component of a comprehensive MRSA colonization surveillance program. It is not intended to diagnose MRSA infection nor to guide or monitor treatment for MRSA infections. Performed at [facility], [place] [address] [place], [place] [address]      Labs: BNP (last 3 results) No results for input(s): BNP in the last 8760 hours. Basic Metabolic Panel: Recent Labs  Lab 05/09/20 1147 05/09/20 1708 05/10/20 0437  NA 138  --  139  K 4.2  --  4.2  CL 105  --  107  CO2 24  --  20*  GLUCOSE 256*  --  155*  BUN 26*  --  24*  CREATININE 1.52* 1.27* 1.10  CALCIUM 8.5*  --  8.3*  MG  --  1.9  --   PHOS  --  3.9  --    Liver Function Tests: Recent Labs  Lab 05/09/20 1147 05/10/20 0437  AST 16 18  ALT 19 15  ALKPHOS 63 56  BILITOT 1.4* 1.3*  PROT  6.0* 5.3*  ALBUMIN 3.2* 2.8*   No results for input(s): LIPASE, AMYLASE in the last 168 hours. Recent Labs  Lab 05/09/20 1722  AMMONIA 11   CBC: Recent Labs  Lab 05/09/20 1147 05/09/20 1708 05/10/20 0437  WBC 7.9 6.2 6.2  NEUTROABS 6.5  --   --   HGB 11.7* 11.2* 11.4*  HCT 36.6* 34.0* 34.6*  MCV 91.3 89.9 90.3  PLT 114* 117* 92*   Cardiac Enzymes: No results for input(s): CKTOTAL, CKMB, CKMBINDEX, TROPONINI in the last 168 hours. BNP: Invalid input(s): POCBNP CBG: Recent Labs  Lab 05/10/20 0619  GLUCAP 153*   D-Dimer No results for input(s): DDIMER in the last 72 hours. Hgb A1c No results for input(s): HGBA1C in the last 72 hours. Lipid Profile No results for input(s): CHOL, HDL, LDLCALC, TRIG, CHOLHDL, LDLDIRECT in the last 72 hours. Thyroid function studies Recent Labs    05/09/20 1708  TSH 2.106   Anemia work up Recent Labs    05/09/20 1708  VITAMINB12 389  FOLATE 27.2   Urinalysis    Component Value Date/Time   COLORURINE YELLOW 05/09/2020 [place] 05/09/2020 1223   LABSPEC 1.012 05/09/2020 1223   PHURINE 5.0 05/09/2020 1223   GLUCOSEU NEGATIVE 05/09/2020 1223   HGBUR MODERATE (A) 05/09/2020 1223   BILIRUBINUR NEGATIVE 05/09/2020 1223   KETONESUR NEGATIVE 05/09/2020 1223   PROTEINUR NEGATIVE 05/09/2020 1223   NITRITE NEGATIVE 05/09/2020 1223   LEUKOCYTESUR TRACE (A) 05/09/2020 1223   Sepsis Labs Invalid input(s): PROCALCITONIN,  WBC,  LACTICIDVEN Microbiology Recent Results (from the past 240 hour(s))  Culture, blood (single)     Status: None (Preliminary result)   Collection Time: 05/09/20 12:04 PM   Specimen: Site Not Specified; Blood  Result Value Ref Range Status   Specimen Description SITE NOT SPECIFIED  Final   Special Requests   Final    BOTTLES DRAWN AEROBIC AND ANAEROBIC Blood Culture results may not be optimal due to an inadequate volume of blood received in culture bottles   Culture   Final    NO GROWTH < 24  HOURS Performed at  [facility], [place] [address], [place], [place] [address]    Report Status PENDING  Incomplete  SARS Coronavirus 2 by RT PCR (hospital order, performed in [facility] hospital lab) Nasopharyngeal Nasopharyngeal Swab     Status: None   Collection Time: 05/09/20 12:11 PM   Specimen: Nasopharyngeal Swab  Result Value Ref Range Status   SARS Coronavirus 2 NEGATIVE NEGATIVE Final    Comment: (NOTE) SARS-CoV-2 target nucleic acids are NOT DETECTED.  The SARS-CoV-2 RNA is generally detectable in upper and lower respiratory specimens during the acute phase of infection. The lowest concentration of SARS-CoV-2 viral copies this assay can detect is 250 copies / mL. A negative result does not preclude SARS-CoV-2 infection and should not be used as the sole basis for treatment or other patient management decisions.  A negative result may occur with improper specimen collection / handling, submission of specimen other than nasopharyngeal swab, presence of viral mutation(s) within the areas targeted by this assay, and inadequate number of viral copies (<250 copies / mL). A negative result must be combined with clinical observations, patient history, and epidemiological information.  Fact Sheet for Patients:   StrictlyIdeas.no  Fact Sheet for Healthcare Providers: BankingDealers.co.za  This test is not yet approved or  cleared by the [place] FDA and has been authorized for detection and/or diagnosis of SARS-CoV-2 by FDA under an Emergency Use Authorization (EUA).  This EUA will remain in effect (meaning this test can be used) for the duration of the COVID-19 declaration under Section 564(b)(1) of the Act, 21 U.S.C. section 360bbb-3(b)(1), unless the authorization is terminated or revoked sooner.  Performed at [facility], [place] [address], [place], [place] [address]   MRSA PCR Screening     Status: None    Collection Time: 05/10/20  3:30 AM   Specimen: Nasal Mucosa; Nasopharyngeal  Result Value Ref Range Status   MRSA by PCR NEGATIVE NEGATIVE Final    Comment:        The GeneXpert MRSA Assay (FDA approved for NASAL specimens only), is one component of a comprehensive MRSA colonization surveillance program. It is not intended to diagnose MRSA infection nor to guide or monitor treatment for MRSA infections. Performed at [facility], [place] [address] [place], [place] [address]      Time coordinating discharge: Over 30 minutes  SIGNED:   Darliss Cheney, MD  [facility] 05/10/2020, 9:46 AM  If 7PM-7AM, please contact night-coverage [email or web address]

## 2020-05-10 NOTE — Evaluation (Signed)
Physical Therapy Evaluation Patient Details Name: Dean Harris MRN: [id number] DOB: 12/24/1940 Today's Date: 05/10/2020   History of Present Illness  Pt is a 79 yo male presenting with AMS after syncopal event. Pt also recently dx with UTI on 8/21. PMH includes: dementia, CAD, bilateral carotid endarterectomy, HLD, depression, anxiety, HTN, PTSD, PNA, DM.  Clinical Impression  Patient presents with fluctuating mobility based on cognitive decline and unfamiliar environment as well as recent syncopal episode.  Likely to return close to baseline in his typical environment with typical routine.  No further skilled PT intervention at this time. Noted plans to return to [facility] today.    Follow Up Recommendations No PT follow up;Supervision/Assistance - 24 hour    Equipment Recommendations  None recommended by PT    Recommendations for Other Services       Precautions / Restrictions Precautions Precautions: Fall      Mobility  Bed Mobility               General bed mobility comments: up in chair  Transfers Overall transfer level: Needs assistance Equipment used: None Transfers: Sit to/from Stand Sit to Stand: Mod assist;+2 physical assistance         General transfer comment: cues and time, but decreased initiation standing from chair with NT in to assist due to soiled depend  Ambulation/Gait Ambulation/Gait assistance: Mod assist;+2 safety/equipment Gait Distance (Feet): 25 Feet Assistive device: Rolling walker (2 wheeled);1 person hand held assist Gait Pattern/deviations: Step-to pattern;Shuffle;Decreased stride length;Leaning posteriorly     General Gait Details: leaning posteriorly and initially with minimal forward translation, with HHA after pt pushing walker out of the way, heavy mod for posterior support improved with leaning forward, then used the walker part of the way back to the chair due to pt choice  Stairs            Wheelchair  Mobility    Modified Rankin (Stroke Patients Only)       Balance Overall balance assessment: Needs assistance Sitting-balance support: Feet supported Sitting balance-Leahy Scale: Fair   Postural control: Posterior lean Standing balance support: Bilateral upper extremity supported Standing balance-Leahy Scale: Poor                               Pertinent Vitals/Pain Pain Assessment: Faces Faces Pain Scale: No hurt    Home Living Family/patient expects to be discharged to:: Other (Comment)                 Additional Comments: Memory Care Unit: [facility]    Prior Function Level of Independence: Needs assistance   Gait / Transfers Assistance Needed: had been walking min guard assist with RW and gait belt one month ago, now WC at memory care unit  ADL's / Homemaking Assistance Needed: dependent for ADLs, can feed self with supervision  Comments: History obtained from grandaughter Dean Harris     Hand Dominance        Extremity/Trunk Assessment   Upper Extremity Assessment Upper Extremity Assessment: Defer to OT evaluation    Lower Extremity Assessment Lower Extremity Assessment: Generalized weakness       Communication   Communication: Expressive difficulties  Cognition Arousal/Alertness: Awake/alert Behavior During Therapy: Flat affect Overall Cognitive Status: History of cognitive impairments - at baseline  General Comments: baseline dementia, can get restless in hospital setting      General Comments General comments (skin integrity, edema, etc.): Grandaughter Dean Harris) present during session and assisting    Exercises     Assessment/Plan    PT Assessment Patent does not need any further PT services  PT Problem List         PT Treatment Interventions      PT Goals (Current goals can be found in the Care Plan section)  Acute Rehab PT Goals Patient Stated Goal: return to [facility] PT Goal Formulation: All assessment and education complete, DC therapy    Frequency     Barriers to discharge        Co-evaluation               AM-PAC PT "6 Clicks" Mobility  Outcome Measure Help needed turning from your back to your side while in a flat bed without using bedrails?: A Little Help needed moving from lying on your back to sitting on the side of a flat bed without using bedrails?: A Little Help needed moving to and from a bed to a chair (including a wheelchair)?: A Lot Help needed standing up from a chair using your arms (e.g., wheelchair or bedside chair)?: A Lot Help needed to walk in hospital room?: A Lot Help needed climbing 3-5 steps with a railing? : Total 6 Click Score: 13    End of Session   Activity Tolerance: Patient tolerated treatment well Patient left: in chair;with family/visitor present   PT Visit Diagnosis: Other abnormalities of gait and mobility (R26.89)    Time: 8182-9937 PT Time Calculation (min) (ACUTE ONLY): 21 min   Charges:   PT Evaluation $PT Eval Moderate Complexity: 1 Mod          Sheran Lawless, PT Acute Rehabilitation Services Pager:[phone] [phone] 05/10/2020   Dean Harris 05/10/2020, 4:47 PM

## 2020-05-10 NOTE — Discharge Instructions (Signed)
Dehydration, Elderly  Dehydration is condition in which there is not enough water or other fluids in the body. This happens when a person loses more fluids than he or she takes in. Important body parts cannot work right without the right amount of fluids. Any loss of fluids from the body can cause dehydration. People 77 years of age or older have a higher risk of dehydration than younger adults. This is because in older age, the body:  Is less able to keep the right amount of water.  Does not respond to temperature changes as well.  Does not get a sense of thirst as easily or quickly. Dehydration can be mild, worse, or very bad. It should be treated right away to keep it from getting very bad. What are the causes? This condition may be caused by:  Conditions that cause loss of water or other fluids, such as: ? Watery poop (diarrhea). ? Vomiting. ? Sweating a lot. ? Peeing (urinating) a lot.  Not drinking enough fluids, especially when you: ? Are ill. ? Are doing things that take a lot of energy to do.  Other illnesses and conditions, such as fever or infection.  Certain medicines, such as medicines that take extra fluid out of the body (diuretics).  Lack of safe drinking water.  Not being able to get enough water and food. What increases the risk? The following factors may make you more likely to develop this condition:  Having a long-term (chronic) illness that has not been treated the right way, such as: ? An illness that may cause you to pee more, such as diabetes. ? Kidney, heart, or lung disease. ? A condition such as dementia. This affects:  The brain and nervous system.  Thinking.  Feelings.  Being 73 years of age or older.  Having a disability.  Living in a place that is high above the ground or sea (high in altitude). The thinner, drier air causes more fluid loss. What are the signs or symptoms? Symptoms of dehydration depend on how bad it is. Mild or worse  dehydration  Thirst.  Dry lips or dry mouth.  Feeling dizzy or light-headed, especially when you stand up from sitting.  Muscle cramps.  Your body making: ? Dark pee (urine). Pee may be the color of tea. ? Less pee than normal. ? Less tears than normal.  Headache. Very bad dehydration  Changes in skin. Skin may: ? Be cold to the touch (clammy). ? Be blotchy or pale. ? Not go back to normal right after you lightly pinch it and let it go.  Little or no tears, pee, or sweat.  Changes in vital signs, such as: ? Fast breathing. ? Low blood pressure. ? Weak pulse. ? Pulse that is more than 100 beats a minute when you are sitting still.  Other changes, such as: ? Feeling very thirsty. ? Eyes that look hollow (sunken). ? Cold hands and feet. ? Being mixed up (confused). ? Being very tired (lethargic) or having trouble waking from sleep. ? Short-term weight loss. ? Loss of consciousness. How is this treated? Treatment for this condition depends on how bad it is. Treatment should start right away. Do not wait until your condition gets very bad. Very bad dehydration is an emergency. You will need to go to a hospital.  Mild or worse dehydration can be treated at home. You may be asked to: ? Drink more fluids. ? Drink an oral rehydration solution (ORS). This drink helps  get the right amounts of fluids and salts and minerals in the blood (electrolytes).  Very bad dehydration can be treated: ? With fluids through an IV tube. ? By getting normal levels of salts and minerals in your blood. This is often done by giving salts and minerals through a tube. The tube is passed through your nose and into your stomach. ? By treating the root cause. Follow these instructions at home: Oral rehydration solution If told by your doctor, drink an ORS:  Make an ORS. Use instructions on the package.  Start by drinking small amounts, about  cup (120 mL) every 5-10 minutes.  Slowly drink more  until you have had the amount that your doctor said to have. Eating and drinking         Drink enough clear fluid to keep your pee pale yellow. If you were told to drink an ORS, finish the ORS first. Then, start slowly drinking other clear fluids. Drink fluids such as: ? Water. Do not drink only water. Doing that can make the salt (sodium) level in your body get too low. ? Water from ice chips you suck on. ? Fruit juice that you have added water to (diluted). ? Low-calorie sports drinks.  Eat foods that have the right amounts of salts and minerals, such as: ? Bananas. ? Oranges. ? Potatoes. ? Tomatoes. ? Spinach.  Do not drink alcohol.  Avoid: ? Drinks that have a lot of sugar. These include:  High-calorie sports drinks.  Fruit juice that you did not add water to.  Soda.  Caffeine. ? Foods that are greasy or have a lot of fat or sugar. General instructions  Take over-the-counter and prescription medicines only as told by your doctor.  Do not take salt tablets. Doing that can make the salt level in your body get too high.  Return to your normal activities as told by your doctor. Ask your doctor what activities are safe for you.  Keep all follow-up visits as told by your doctor. This is important. Contact a doctor if:  You have pain in your belly (abdomen) and the pain: ? Gets worse. ? Stays in one place.  You have a rash.  You have a stiff neck.  You get angry or annoyed (irritable) more easily than normal.  You are more tired or have a harder time waking than normal.  You feel: ? Weak or dizzy. ? Very thirsty. Get help right away if you have:  Any symptoms of very bad dehydration.  A fever.  A very bad headache.  Symptoms of vomiting, such as: ? Your vomiting gets worse or does not go away. ? Your vomit has blood or green stuff in it. ? You cannot eat or drink without vomiting.  Problems with peeing or pooping (having a bowel movement), such  as: ? Watery poop that gets worse or does not go away. ? Blood in your poop (stool). This may cause poop to look black and tarry. ? Not peeing in 6-8 hours. ? Peeing only a small amount of very dark pee in 6-8 hours.  Trouble breathing.  Symptoms that get worse with treatment. These symptoms may be an emergency. Do not wait to see if the symptoms will go away. Get medical help right away. Call your local emergency services (911 in the [place] Do not drive yourself to the hospital. Summary  Dehydration is a condition in which there is not enough water or other fluids in the body. This  happens when a person loses more fluids than he or she takes in.  Treatment for this condition depends on how bad it is. Treatment should be started right away. Do not wait until your condition gets very bad.  Drink enough clear fluid to keep your pee pale yellow. If you were told to drink an oral rehydration solution (ORS), finish the ORS first. Then, start slowly drinking other clear fluids.  Take over-the-counter and prescription medicines only as told by your doctor.  Get help right away if you have any symptoms of very bad dehydration. This information is not intended to replace advice given to you by your health care provider. Make sure you discuss any questions you have with your health care provider. Document Revised: 04/14/2019 Document Reviewed: 04/14/2019 Elsevier Patient Education  2020 [employer].

## 2020-05-10 NOTE — ED Notes (Signed)
Pt agitated and combative. Pt refusing all vitals at this time.

## 2020-05-10 NOTE — Evaluation (Signed)
Clinical/Bedside Swallow Evaluation Patient Details  Name: Dean Harris MRN: [id number] Date of Birth: [birth year]  Today's Date: 05/10/2020 Time: SLP Start Time (ACUTE ONLY): 0730 SLP Stop Time (ACUTE ONLY): 0741 SLP Time Calculation (min) (ACUTE ONLY): 11 min  Past Medical History:  Past Medical History:  Diagnosis Date  . Chronic stable angina (HCC)   . 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 Depression   . Diabetes mellitus   . Essential hypertension   . Hyperlipidemia with target LDL less than 70   . Memory difficulties   . 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 Pneumonia    x2  . PTSD (post-traumatic stress disorder) - with depression symptoms    Recently started on medication by the [place]. This has helped his memory issues.  . Thrombocytopenia (HCC) 04/29/2014  . Vitamin D deficiency    Past Surgical History:  Past Surgical History:  Procedure Laterality Date  . CAROTID ENDARTERECTOMY    . LEFT HEART CATHETERIZATION WITH CORONARY ANGIOGRAM N/A 02/13/2012   Procedure: LEFT HEART CATHETERIZATION WITH CORONARY ANGIOGRAM;  Surgeon: Donato Schultz, MD;  Location: [facility] CATH LAB;  Service: Cardiovascular: Diffuse mid LAD 50%, 50%pOM1, 50%mRCA -> medical management  . TRANSTHORACIC ECHOCARDIOGRAM  01/2012   EF 55-60%. Mild LVH. No RWMA, mild Aortic Stenosis (mean/peak gradient 13 mmHg/21 mmHg)  . TRANSTHORACIC ECHOCARDIOGRAM  May 2017, May 2019   A) EF 60-65% w/o RWMA. Normal DF for age. Mod AD (mean/peak gradient 24/35 mmHg); b) EF 60-65%, mod LVH, Mod AS (Mean gradient (S): 28 mm Hg. Peak 43 mmHg  . US CAROTID DOPPLER BILATERAL ([facility] HX) Bilateral 02/05/2016   Stable mild-to-moderate disease with bilateral heterogeneous plaque. RICA - 1-39%, LICA 40-59%. Bilateral subclavian and vertebral arteries normal.    HPI:  Pt is a 79 yo male presenting with AMS after syncopal event. Pt also recently dx with UTI on 8/21. PMH includes: dementia, CAD, bilateral carotid endarterectomy, HLD, depression, anxiety, HTN, PTSD, PNA, DM. Previous BSE in April 2021 recommended Dys 1 diet and thin liquids due to cognitively-based oral dysphagia; upgraded to Dys 3 before discharge.    Assessment / Plan / Recommendation Clinical Impression  Pt needs extra time for mastication and oral clearance, especially with drier textures and regardless of bolus size. SLP provided extra time and sips of water to assist in posterior transit. No overt s/s of aspiration noted. Recommend starting Dys 3 (mechanical soft) diet and thin liquids. Per chart review, this was his previously recommended diet (family was chopping up some foods, such as meats, at home already) and may still be his baseline diet given baseline cognition. Will f/u at least for tolerance in the setting of AMS as medical w/u continues.   SLP Visit Diagnosis: Dysphagia, unspecified (R13.10)    Aspiration Risk  Mild aspiration risk    Diet Recommendation Dysphagia 3 (Mech soft);Thin liquid   Liquid Administration via: Cup;Straw Medication Administration: Whole meds with puree Supervision: Staff to assist with self feeding Compensations: Minimize environmental distractions;Small sips/bites;Follow solids with liquid Postural Changes: Seated upright at 90 degrees    Other  Recommendations Oral Care Recommendations: Oral care BID   Follow up Recommendations 24 hour supervision/assistance      Frequency and Duration min 2x/week  1 week       Prognosis Prognosis for Safe Diet Advancement:  Good      Swallow Study   General HPI: Pt is a 79 yo male presenting with AMS after syncopal event. Pt also recently dx with UTI on 8/21. PMH includes: dementia, CAD, bilateral carotid endarterectomy, HLD, depression, anxiety, HTN, PTSD, PNA, DM. Previous BSE in April 2021  recommended Dys 1 diet and thin liquids due to cognitively-based oral dysphagia; upgraded to Dys 3 before discharge.  Type of Study: Bedside Swallow Evaluation Previous Swallow Assessment: see HPI Diet Prior to this Study: NPO Temperature Spikes Noted: No Respiratory Status: Room air History of Recent Intubation: No Behavior/Cognition: Alert;Cooperative;Pleasant mood;Requires cueing Oral Cavity Assessment: Within Functional Limits Oral Care Completed by SLP: No Oral Cavity - Dentition: Adequate natural dentition Vision: Functional for self-feeding Self-Feeding Abilities: Needs assist Patient Positioning: Upright in bed Baseline Vocal Quality: Normal    Oral/Motor/Sensory Function Overall Oral Motor/Sensory Function: Within functional limits   Ice Chips Ice chips: Not tested   Thin Liquid Thin Liquid: Within functional limits Presentation: Straw    Nectar Thick Nectar Thick Liquid: Not tested   Honey Thick Honey Thick Liquid: Not tested   Puree Puree: Impaired Presentation: Spoon Oral Phase Functional Implications: Prolonged oral transit   Solid     Solid: Impaired Presentation: Self Fed Oral Phase Impairments: Impaired mastication Oral Phase Functional Implications: Prolonged oral transit      Mahala Menghini., M.A. CCC-SLP Acute Rehabilitation Services Pager [phone] Office [phone]  05/10/2020,8:03 AM

## 2020-05-10 NOTE — ED Notes (Signed)
Suture cart at bedside 

## 2020-05-10 NOTE — ED Notes (Signed)
Patient transported to CT 

## 2020-05-10 NOTE — Evaluation (Signed)
Occupational Therapy Evaluation Patient Details Name: Dean Harris MRN: [id number] DOB: May 25, 1941 Today's Date: 05/10/2020    History of Present Illness Pt is a 79 yo male presenting with AMS after syncopal event. Pt also recently dx with UTI on 8/21. PMH includes: dementia, CAD, bilateral carotid endarterectomy, HLD, depression, anxiety, HTN, PTSD, PNA, DM.   Clinical Impression   Pt from memory care unit where he is total care for ADL, feeds himself with supervision. 1 month ago, when living at home Pt was using RW/gait belt and min guard assist for mobility, but more recently has been in wheelchair. Pt max to total A for ADL during session today, mod A to come to EOB, and max A with RW for stand pivot transfer to recliner. Pt has expressive difficulties at baseline but did verbalize one word responses today. Grandaughter [name] present throughout session, and provided background information. Since Pt is at baseline for ADL, no further Skilled intervention needed. OT will sign off.     Follow Up Recommendations  No OT follow up    Equipment Recommendations  None recommended by OT (Pt has access to appropriate DME)    Recommendations for Other Services       Precautions / Restrictions Precautions Precautions: Fall Restrictions Weight Bearing Restrictions: No      Mobility Bed Mobility Overal bed mobility: Needs Assistance Bed Mobility: Supine to Sit     Supine to sit: Mod assist;HOB elevated     General bed mobility comments: assist for sequencing and trunk elevation, hips EOB with use of pad  Transfers Overall transfer level: Needs assistance Equipment used: Rolling walker (2 wheeled) Transfers: Sit to/from [employer] Sit to Stand: Mod assist Stand pivot transfers: Max assist       General transfer comment: lots of cues and encouragement, heavy physical guidance to chair - Pt is strong but struggles to coordinate body    Balance Overall  balance assessment: Needs assistance Sitting-balance support: Single extremity supported;Feet supported Sitting balance-Leahy Scale: Fair Sitting balance - Comments: min guard EOB   Standing balance support: Bilateral upper extremity supported Standing balance-Leahy Scale: Poor Standing balance comment: dependent on RW and external support                           ADL either performed or assessed with clinical judgement   ADL Overall ADL's : At baseline                                       General ADL Comments: total care at baseline     Vision Baseline Vision/History: Wears glasses Additional Comments: glasses not present in room     Perception     Praxis      Pertinent Vitals/Pain Pain Assessment: Faces Faces Pain Scale: No hurt Pain Intervention(s): Monitored during session     Hand Dominance     Extremity/Trunk Assessment Upper Extremity Assessment Upper Extremity Assessment: Overall WFL for tasks assessed   Lower Extremity Assessment Lower Extremity Assessment: Defer to PT evaluation   Cervical / Trunk Assessment Cervical / Trunk Assessment: Normal   Communication Communication Communication: Expressive difficulties   Cognition Arousal/Alertness: Awake/alert Behavior During Therapy: Flat affect;WFL for tasks assessed/performed Overall Cognitive Status: History of cognitive impairments - at baseline  General Comments: baseline dementia, can get restless in hospital setting   General Comments  Grandaughter [name] (former caregiver) present during session and providing background.     Exercises     Shoulder Instructions      Home Living Family/patient expects to be discharged to:: Other (Comment)                                 Additional Comments: Memory Care Unit      Prior Functioning/Environment Level of Independence: Needs assistance  Gait / Transfers  Assistance Needed: had been walking min guard assist with RW and gait belt one month ago, now WC at memory care unit ADL's / Homemaking Assistance Needed: dependent for ADLs, can feed self with supervision   Comments: History obtained from grandaughter [place]        OT Problem List: Impaired balance (sitting and/or standing)      OT Treatment/Interventions:      OT Goals(Current goals can be found in the care plan section) Acute Rehab OT Goals Patient Stated Goal: none stated OT Goal Formulation: With patient  OT Frequency:     Barriers to D/C:            Co-evaluation              AM-PAC OT "6 Clicks" Daily Activity     Outcome Measure Help from another person eating meals?: A Little Help from another person taking care of personal grooming?: A Lot Help from another person toileting, which includes using toliet, bedpan, or urinal?: Total Help from another person bathing (including washing, rinsing, drying)?: Total Help from another person to put on and taking off regular upper body clothing?: A Lot Help from another person to put on and taking off regular lower body clothing?: A Lot 6 Click Score: 11   End of Session Equipment Utilized During Treatment: Gait belt;Rolling walker Nurse Communication: Mobility status  Activity Tolerance: Patient tolerated treatment well Patient left: in chair;with call bell/phone within reach;with family/visitor present  OT Visit Diagnosis: History of falling (Z91.81)                Time: 2800-3491 OT Time Calculation (min): 31 min Charges:  OT General Charges $OT Visit: 1 Visit OT Evaluation $OT Eval Moderate Complexity: 1 Mod OT Treatments $Self Care/Home Management : 8-22 mins  Nyoka Cowden OTR/L Acute Rehabilitation Services Pager: [phone] Office: [phone]  Evern Bio Demondre Aguas 05/10/2020, 10:22 AM

## 2020-05-10 NOTE — H&P (Signed)
History and Physical    Dean Harris KPT:[id number] DOB: 28-Sep-1940 DOA: 05/10/2020  PCP: Bennie Pierini, FNP   Patient coming from: [facility].   I have personally briefly reviewed patient's old medical records in [facility]  Chief Complaint: Fall.  HPI: Dean Harris is a 79 y.o. male with medical history significant of chronic stable angina, CAD, type 2 diabetes, hyperlipidemia, moderate calcific aortic stenosis, history of pneumonia x2, PTSD with depression, dementia, thrombocytopenia, vitamin D deficiency who was brought back to the emergency department after being discharged earlier to [facility] home and having a fall on arrival the facility hitting his head with a dresser in his room.  He is unable to offer further detail to his story, but imaging reveals he has developed a new subdural hematoma that was not present on CT head done yesterday.  ED Course: Initial vital signs were Pulse 80, respirations 16, BP 150/73 mmHg O2 sat 97% on room air.   CBC shows white count 6.6, hemoglobin 12.1 g/dL and platelets 170.  BMP showed a glucose of 142 and calcium of 8.6 mg/dL.  Review of Systems: As per HPI otherwise all other systems reviewed and are negative.  Past Medical History:  Diagnosis Date  . Chronic stable angina (HCC)   . 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 Depression   . Diabetes mellitus   . Essential hypertension   . Hyperlipidemia with target LDL less than 70   . Memory difficulties   . 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 Pneumonia    x2  . PTSD (post-traumatic stress disorder) - with depression symptoms    Recently started on medication by the [place]. This has helped his memory issues.  . Thrombocytopenia (HCC) 04/29/2014  . Vitamin D deficiency    Past Surgical  History:  Procedure Laterality Date  . CAROTID ENDARTERECTOMY    . LEFT HEART CATHETERIZATION WITH CORONARY ANGIOGRAM N/A 02/13/2012   Procedure: LEFT HEART CATHETERIZATION WITH CORONARY ANGIOGRAM;  Surgeon: Donato Schultz, MD;  Location: [facility] CATH LAB;  Service: Cardiovascular: Diffuse mid LAD 50%, 50%pOM1, 50%mRCA -> medical management  . TRANSTHORACIC ECHOCARDIOGRAM  01/2012   EF 55-60%. Mild LVH. No RWMA, mild Aortic Stenosis (mean/peak gradient 13 mmHg/21 mmHg)  . TRANSTHORACIC ECHOCARDIOGRAM  May 2017, May 2019   A) EF 60-65% w/o RWMA. Normal DF for age. Mod AD (mean/peak gradient 24/35 mmHg); b) EF 60-65%, mod LVH, Mod AS (Mean gradient (S): 28 mm Hg. Peak 43 mmHg  . US CAROTID DOPPLER BILATERAL ([facility] HX) Bilateral 02/05/2016   Stable mild-to-moderate disease with bilateral heterogeneous plaque. RICA - 1-39%, LICA 40-59%. Bilateral subclavian and vertebral arteries normal.   Social History  reports that he quit smoking about 32 years ago. His smoking use included cigarettes. He has never used smokeless tobacco. He reports that he does not drink alcohol and does not use drugs.  Allergies  Allergen Reactions  . Dust Mite Extract Other (See Comments)    Sneezing, coughing  . Mold Extract [Trichophyton] Other (See Comments)    Sneezing, coughing  . Pollen Extract Other (See Comments)    Sneezing, coughing  . Tetracyclines & Related Other (See Comments)    Generic only   Family History  Problem Relation Age of Onset  . Diabetes Mother   . Stroke Mother   .  Hypertension Mother   . Hyperlipidemia Mother   . Cancer Father   . Hypertension Brother   . Cancer Other    Prior to Admission medications   Medication Sig Start Date End Date Taking? Authorizing Provider  acetaminophen (TYLENOL) 500 MG tablet Take 500 mg by mouth every 6 (six) hours as needed (for fever 99.5-101 F, minor headaches or discomfort- report any fever >101 F, severe headache or discomfort).    [provider]   alum & mag hydroxide-simeth (MI-ACID) 200-200-20 MG/5ML suspension Take 30 mLs by mouth every 6 (six) hours as needed for indigestion or heartburn.    [provider]  aspirin EC 81 MG tablet Take 1 tablet (81 mg total) by mouth daily. 05/20/19   Marykay Lex, MD  clonazePAM (KLONOPIN) 0.5 MG tablet Take 0.5 mg by mouth 2 (two) times daily as needed for anxiety.    [provider]  donepezil (ARICEPT) 10 MG tablet TAKE 1/2 (ONE-HALF) TABLET BY MOUTH AT BEDTIME (Needs to be seen before next refill) Patient taking differently: Take 5 mg by mouth at bedtime.  03/22/20   Daphine Deutscher, Mary-Margaret, FNP  guaifenesin (ROBAFEN) 100 MG/5ML syrup Take 200 mg by mouth every 6 (six) hours as needed for cough.    [provider]  isosorbide mononitrate (IMDUR) 30 MG 24 hr tablet Take 1 tablet (30 mg total) by mouth daily. 03/22/20   Daphine Deutscher, Mary-Margaret, FNP  loperamide (IMODIUM) 2 MG capsule Take 2 mg by mouth as needed (with each loose stool/diarrhea- Max of 8 doses/24 hours- REPORT ANY BLOOD).    [provider]  magnesium hydroxide (MILK OF MAGNESIA) 400 MG/5ML suspension Take 30 mLs by mouth at bedtime as needed for mild constipation.    [provider]  memantine (NAMENDA) 10 MG tablet Take 1 tablet (10 mg total) by mouth 2 (two) times daily. 03/22/20   Bennie Pierini, FNP  Neomycin-Bacitracin-Polymyxin (TRIPLE ANTIBIOTIC) [phone] OINT Apply 1 application topically See admin instructions. Apply to minor skin tears or abrasions after cleansing with normal saline- cover with Band-Aids or gauze & tape    [provider]  nitroGLYCERIN (NITROSTAT) 0.4 MG SL tablet Use 1 tabket under tongue at onset and may repeat every 5 minutes x2 Patient taking differently: Place 0.4 mg under the tongue every 5 (five) minutes x 3 doses as needed for chest pain (CALL MD, IF NO RELIEF).  02/15/20   Daphine Deutscher, Mary-Margaret, FNP  sertraline (ZOLOFT) 100 MG tablet Take 2  tablets (200 mg total) by mouth in the morning. 03/22/20   Daphine Deutscher, Mary-Margaret, FNP  Travoprost, BAK Free, (TRAVATAN) 0.004 % SOLN ophthalmic solution Place 1 drop into both eyes at bedtime. 03/22/20   Bennie Pierini, FNP   Physical Exam: Vitals:   05/10/20 1817 05/10/20 1845 05/10/20 2015 05/10/20 2130  BP:   (!) 150/73 128/78  Pulse: 64 77 71 90  Resp:      SpO2: 98% 98% 98% 100%   Constitutional: NAD, calm, comfortable Eyes: PERRL, lids and conjunctivae normal ENMT: Mucous membranes are moist. Posterior pharynx clear of any exudate or lesions. Neck: normal, supple, no masses, no thyromegaly Respiratory: clear to auscultation bilaterally, no wheezing, no crackles. Normal respiratory effort. No accessory muscle use.  Cardiovascular: Regular rate and rhythm, 2/6 SEM, no rubs / gallops. No extremity edema. 2+ pedal pulses. No carotid bruits.  Abdomen: Nondistended.  BS positive.  Soft, no tenderness, no masses palpated. No hepatosplenomegaly. Bowel sounds positive.  Musculoskeletal: Generalized weakness.  No  clubbing / cyanosis. Good ROM, no contractures. Normal muscle tone.  Skin: Positive facial erythema, particularly of nose and cheeks.  Left frontoparietal area edema. Neurologic: CN 2-12 grossly intact. Sensation intact, DTR normal. Strength 5/5 in all 4.  Psychiatric:  Alert and oriented x 1.    Labs on Admission: I have personally reviewed following labs and imaging studies  CBC: Recent Labs  Lab 05/09/20 1147 05/09/20 1708 05/10/20 0437 05/10/20 2051  WBC 7.9 6.2 6.2 6.6  NEUTROABS 6.5  --   --  4.5  HGB 11.7* 11.2* 11.4* 12.1*  HCT 36.6* 34.0* 34.6* 36.4*  MCV 91.3 89.9 90.3 88.8  PLT 114* 117* 92* 143*    Basic Metabolic Panel: Recent Labs  Lab 05/09/20 1147 05/09/20 1708 05/10/20 0437 05/10/20 2051  NA 138  --  139 138  K 4.2  --  4.2 4.1  CL 105  --  107 106  CO2 24  --  20* 22  GLUCOSE 256*  --  155* 142*  BUN 26*  --  24* 21  CREATININE 1.52*  1.27* 1.10 1.12  CALCIUM 8.5*  --  8.3* 8.6*  MG  --  1.9  --   --   PHOS  --  3.9  --   --     GFR: Estimated Creatinine Clearance: 47.1 mL/min (by C-G formula based on SCr of 1.12 mg/dL).  Liver Function Tests: Recent Labs  Lab 05/09/20 1147 05/10/20 0437  AST 16 18  ALT 19 15  ALKPHOS 63 56  BILITOT 1.4* 1.3*  PROT 6.0* 5.3*  ALBUMIN 3.2* 2.8*    Urine analysis:    Component Value Date/Time   COLORURINE YELLOW 05/09/2020 1223   APPEARANCEUR CLEAR 05/09/2020 1223   LABSPEC 1.012 05/09/2020 1223   PHURINE 5.0 05/09/2020 1223   GLUCOSEU NEGATIVE 05/09/2020 1223   HGBUR MODERATE (A) 05/09/2020 1223   BILIRUBINUR NEGATIVE 05/09/2020 1223   KETONESUR NEGATIVE 05/09/2020 1223   PROTEINUR NEGATIVE 05/09/2020 1223   NITRITE NEGATIVE 05/09/2020 1223   LEUKOCYTESUR TRACE (A) 05/09/2020 1223    Radiological Exams on Admission: CT Head Wo Contrast  Result Date: 05/10/2020 CLINICAL DATA:  Minor head trauma. EXAM: CT HEAD WITHOUT CONTRAST TECHNIQUE: Contiguous axial images were obtained from the base of the skull through the vertex without intravenous contrast. COMPARISON:  05/09/2020 FINDINGS: Brain: Interval development of a right acute subdural hematoma overlying the right temporoparietal region. Hematoma measures approximately 3-4 mm in thickness. No associated midline shift Diffuse loss of parenchymal volume is consistent with atrophy. Patchy low attenuation in the deep hemispheric and periventricular white matter is nonspecific, but likely reflects chronic microvascular ischemic demyelination. Vascular: No hyperdense vessel or unexpected calcification. Skull: No evidence for fracture. No worrisome lytic or sclerotic lesion. Sinuses/Orbits: The visualized paranasal sinuses and mastoid air cells are clear. Visualized portions of the globes and intraorbital fat are unremarkable. Other: None. IMPRESSION: Interval development of a right acute subdural hematoma since yesterday's CT scan.  Hematoma measures approximately 3-4 mm in thickness, overlies the right temporoparietal region and generates no substantial mass-effect nor midline shift. Atrophy with chronic small vessel white matter ischemic disease. Electronically Signed   By: Kennith CenterEric  Mansell M.D.   On: 05/10/2020 19:17   CT Head Wo Contrast  Result Date: 05/09/2020 CLINICAL DATA:  Near-syncope. EXAM: CT HEAD WITHOUT CONTRAST TECHNIQUE: Contiguous axial images were obtained from the base of the skull through the vertex without intravenous contrast. COMPARISON:  MRI brain dated January 11, 2020. CT  head dated January 10, 2020. FINDINGS: Brain: No evidence of acute infarction, hemorrhage, hydrocephalus, extra-axial collection or mass lesion/mass effect. Stable atrophy and chronic ventriculomegaly. Vascular: Calcified atherosclerosis at the skullbase. No hyperdense vessel. Skull: Normal. Negative for fracture or focal lesion. Sinuses/Orbits: No acute finding. Other: None. IMPRESSION: 1. No acute intracranial abnormality. 2. Stable atrophy and chronic ventriculomegaly. Electronically Signed   By: Obie Dredge M.D.   On: 05/09/2020 13:44   CT Cervical Spine Wo Contrast  Result Date: 05/10/2020 CLINICAL DATA:  Fall, neck pain EXAM: CT CERVICAL SPINE WITHOUT CONTRAST TECHNIQUE: Multidetector CT imaging of the cervical spine was performed without intravenous contrast. Multiplanar CT image reconstructions were also generated. COMPARISON:  None. FINDINGS: Alignment: Normal Skull base and vertebrae: No acute fracture. No primary bone lesion or focal pathologic process. Soft tissues and spinal canal: No prevertebral fluid or swelling. No visible canal hematoma. Disc levels: Diffuse degenerative disc disease with disc space narrowing and spurring from C4-5 through C7-T1. Diffuse bilateral degenerative facet disease. Upper chest: No acute findings Other: Carotid artery calcifications. IMPRESSION: Diffuse degenerative changes.  No acute bony abnormality.  Electronically Signed   By: Charlett Nose M.D.   On: 05/10/2020 21:21   DG Chest Port 1 View  Result Date: 05/09/2020 CLINICAL DATA:  Syncope, altered mental status, hypotension EXAM: PORTABLE CHEST 1 VIEW COMPARISON:  02/16/2020 chest radiograph. FINDINGS: Stable cardiomediastinal silhouette with normal heart size. No pneumothorax. No pleural effusion. No pulmonary edema. Slightly low lung volumes. Mild streaky/tram track type opacities in the medial lung bases bilaterally. IMPRESSION: Mild streaky/tram track type opacities in the medial lung bases bilaterally, which could indicate mild bronchiectasis. No acute cardiopulmonary disease. Electronically Signed   By: Delbert Phenix M.D.   On: 05/09/2020 12:14   ECHOCARDIOGRAM COMPLETE  Result Date: 05/09/2020    ECHOCARDIOGRAM REPORT   Patient Name:   CLOYS VERA Date of Exam: 05/09/2020 Medical Rec #:  [id number]      Height:       62.0 in Accession #:    [id number]     Weight:       140.4 lb Date of Birth:  1941-04-15     BSA:          1.645 m Patient Age:    78 years       BP:           146/69 mmHg Patient Gender: M              HR:           78 bpm. Exam Location:  Inpatient Procedure: 2D Echo Indications:    Syncope R55  History:        Patient has prior history of Echocardiogram examinations, most                 recent 01/20/2019. CAD; Risk Factors:Hypertension and Diabetes.  Sonographer:    Thurman Coyer RDCS (AE) Referring Phys: [id number] Ollen Bowl  Sonographer Comments: Restricted Mobility. IMPRESSIONS  1. Left ventricular ejection fraction, by estimation, is 60 to 65%. The left ventricle has normal function. The left ventricle has no regional wall motion abnormalities. Left ventricular diastolic parameters are consistent with Grade I diastolic dysfunction (impaired relaxation).  2. Right ventricular systolic function is normal. The right ventricular size is normal. Tricuspid regurgitation signal is inadequate for assessing PA pressure.  3. The  mitral valve is grossly normal. No evidence of mitral valve regurgitation. No evidence of mitral stenosis.  4.  The aortic valve is tricuspid. Aortic valve regurgitation is not visualized. Moderate aortic valve stenosis. Aortic valve area, by VTI measures 1.02 cm. Aortic valve mean gradient measures 32.0 mmHg. Aortic valve Vmax measures 3.50 m/s.  5. The inferior vena cava is normal in size with greater than 50% respiratory variability, suggesting right atrial pressure of 3 mmHg. Comparison(s): No significant change from prior study. Aortic stenosis remains moderate. FINDINGS  Left Ventricle: Left ventricular ejection fraction, by estimation, is 60 to 65%. The left ventricle has normal function. The left ventricle has no regional wall motion abnormalities. The left ventricular internal cavity size was normal in size. There is  no left ventricular hypertrophy. Left ventricular diastolic parameters are consistent with Grade I diastolic dysfunction (impaired relaxation). Right Ventricle: The right ventricular size is normal. No increase in right ventricular wall thickness. Right ventricular systolic function is normal. Tricuspid regurgitation signal is inadequate for assessing PA pressure. Left Atrium: Left atrial size was normal in size. Right Atrium: Right atrial size was normal in size. Pericardium: There is no evidence of pericardial effusion. Mitral Valve: The mitral valve is grossly normal. No evidence of mitral valve regurgitation. No evidence of mitral valve stenosis. Tricuspid Valve: The tricuspid valve is grossly normal. Tricuspid valve regurgitation is not demonstrated. No evidence of tricuspid stenosis. Aortic Valve: The aortic valve is tricuspid. . There is severe thickening and severe calcifcation of the aortic valve. Aortic valve regurgitation is not visualized. Moderate aortic stenosis is present. There is severe thickening of the aortic valve. There is severe calcifcation of the aortic valve. Aortic  valve mean gradient measures 32.0 mmHg. Aortic valve peak gradient measures 49.0 mmHg. Aortic valve area, by VTI measures 1.02 cm. Pulmonic Valve: The pulmonic valve was grossly normal. Pulmonic valve regurgitation is not visualized. No evidence of pulmonic stenosis. Aorta: The aortic root and ascending aorta are structurally normal, with no evidence of dilitation. Venous: The inferior vena cava is normal in size with greater than 50% respiratory variability, suggesting right atrial pressure of 3 mmHg. IAS/Shunts: The interatrial septum appears to be lipomatous. The atrial septum is grossly normal.  LEFT VENTRICLE PLAX 2D LVIDd:         3.75 cm  Diastology LVIDs:         2.70 cm  LV e' lateral:   8.59 cm/s LV PW:         1.00 cm  LV E/e' lateral: 11.4 LV IVS:        1.00 cm  LV e' medial:    7.72 cm/s LVOT diam:     2.20 cm  LV E/e' medial:  12.7 LV SV:         94 LV SV Index:   57 LVOT Area:     3.80 cm  RIGHT VENTRICLE RV S prime:     14.40 cm/s TAPSE (M-mode): 2.1 cm LEFT ATRIUM             Index       RIGHT ATRIUM           Index LA diam:        3.45 cm 2.10 cm/m  RA Area:     16.50 cm LA Vol (A2C):   77.0 ml 46.81 ml/m RA Volume:   43.10 ml  26.20 ml/m LA Vol (A4C):   48.4 ml 29.43 ml/m LA Biplane Vol: 63.6 ml 38.67 ml/m  AORTIC VALVE AV Area (Vmax):    0.99 cm AV Area (Vmean):   0.98 cm  AV Area (VTI):     1.02 cm AV Vmax:           350.00 cm/s AV Vmean:          263.500 cm/s AV VTI:            0.920 m AV Peak Grad:      49.0 mmHg AV Mean Grad:      32.0 mmHg LVOT Vmax:         90.90 cm/s LVOT Vmean:        67.600 cm/s LVOT VTI:          0.247 m LVOT/AV VTI ratio: 0.27  AORTA Ao Root diam: 3.25 cm MITRAL VALVE MV Area (PHT): 2.83 cm    SHUNTS MV Decel Time: 268 msec    Systemic VTI:  0.25 m MV E velocity: 97.90 cm/s  Systemic Diam: 2.20 cm MV A velocity: 90.00 cm/s MV E/A ratio:  1.09 Lennie Odor MD Electronically signed by Lennie Odor MD Signature Date/Time: 05/09/2020/5:16:06 PM    Final     VAS US CAROTID  Result Date: 05/10/2020 Carotid Arterial Duplex Study Indications:  Syncope and Weakness. Risk Factors: Hyperlipidemia, Diabetes, coronary artery disease. Performing Technologist: Jannet Askew RCT RDMS  Examination Guidelines: A complete evaluation includes B-mode imaging, spectral Doppler, color Doppler, and power Doppler as needed of all accessible portions of each vessel. Bilateral testing is considered an integral part of a complete examination. Limited examinations for reoccurring indications may be performed as noted.  Right Carotid Findings: +----------+--------+--------+--------+------------------+------------------+           PSV cm/sEDV cm/sStenosisPlaque DescriptionComments           +----------+--------+--------+--------+------------------+------------------+ CCA Prox  106     15                                                   +----------+--------+--------+--------+------------------+------------------+ CCA Distal90      11              focal and smooth  intimal thickening +----------+--------+--------+--------+------------------+------------------+ ICA Prox  98      21      1-39%                     intimal thickening +----------+--------+--------+--------+------------------+------------------+ ICA Distal96      17                                                   +----------+--------+--------+--------+------------------+------------------+ ECA       97      1                                                    +----------+--------+--------+--------+------------------+------------------+ +----------+--------+-------+--------+-------------------+           PSV cm/sEDV cmsDescribeArm Pressure (mmHG) +----------+--------+-------+--------+-------------------+ Subclavian182                                        +----------+--------+-------+--------+-------------------+ +---------+--------+--------+---------+ VertebralPSV  cm/sEDV cm/sAntegrade +---------+--------+--------+---------+  Left Carotid Findings: +----------+--------+--------+--------+------------------+------------------+           PSV cm/sEDV cm/sStenosisPlaque DescriptionComments           +----------+--------+--------+--------+------------------+------------------+ CCA Prox  93      29                                                   +----------+--------+--------+--------+------------------+------------------+ CCA Distal102     18                                intimal thickening +----------+--------+--------+--------+------------------+------------------+ ICA Prox  288     26      40-59%  focal and smooth  intimal thickening +----------+--------+--------+--------+------------------+------------------+ ICA Distal134     20                                                   +----------+--------+--------+--------+------------------+------------------+ ECA       170     3                                                    +----------+--------+--------+--------+------------------+------------------+ +----------+--------+--------+--------+-------------------+           PSV cm/sEDV cm/sDescribeArm Pressure (mmHG) +----------+--------+--------+--------+-------------------+ Subclavian122     16                                  +----------+--------+--------+--------+-------------------+ +---------+--------+--+--------+--+---------+ VertebralPSV cm/s99EDV cm/s23Antegrade +---------+--------+--+--------+--+---------+   Summary: Right Carotid: Velocities in the right ICA are consistent with a 1-39% stenosis. Left Carotid: Velocities in the left ICA are consistent with a 40-59% stenosis. Vertebrals: Bilateral vertebral arteries demonstrate antegrade flow. *See table(s) above for measurements and observations.  Electronically signed by Sherald Hess MD on 05/10/2020 at 3:16:58 PM.    Final      EKG: Independently  reviewed.  Vent. rate 64 BPM PR interval * ms QRS duration 118 ms QT/QTc 455/470 ms P-R-T axes 58 75 32 Sinus rhythm Nonspecific intraventricular conduction delay  Assessment/Plan Principal Problem:   Acute subdural hematoma (HCC) Observation/telemetry. Frequent neurochecks. On Keppra for seizures prophylaxis. Repeat CT in a.m. Neurosurgery will evaluate in the morning.  Continue Active Problems:   Coronary artery disease, non-occlusive Held aspirin 81 mg p.o. daily. Continue Imdur 30 mg p.o. daily. I will start low-dose ACE inhibitor and beta-blocker.    Essential hypertension, benign Lisinopril 20 mg was temporarily held due to AKI. Would benefit from resuming at a lower dose. I would benefit from low-dose beta-blocker (History of CAD and grade 1 diastolic dysfunction)    Grade I diastolic dysfunction Asymptomatic. Start low dose beta blocker. Resume lisinopril at a lower dose.    Type 2 diabetes mellitus (HCC) Carbohydrate modified diet. CBG monitoring before meals and bedtime.    COPD (chronic obstructive pulmonary disease) (HCC) Asymptomatic at this time. Supplemental oxygen as needed. Bronchodilators as needed.    Depression, recurrent (HCC) Continue sertraline 200 mg p.o.  daily.    Dementia (HCC) Continue Aricept and Namenda. Supportive care.    Glaucoma Continue Travatan drops.    DVT prophylaxis: SCDs. Code Status:   Full code. Family Communication: Disposition Plan:   Patient is from:  [place] home.  Anticipated DC to:  [place] home  Anticipated DC date:  05/11/2020.  Anticipated DC barriers: Clinical status.  Repeat CT imaging.   Consults called:  Neurosurgery will evaluate in a.m. after repeat CT. Admission status:  Observation/telemetry.   Severity of Illness: Medium to high with potential to exacerbate if SDH increases in size.  Bobette Mo MD [facility]  How to contact the [facility] Attending or Consulting provider 7A  - 7P or covering provider during after hours 7P -7A, for this patient?   1. Check the care team in [facility] and look for a) attending/consulting [facility] provider listed and b) the [facility] team listed 2. Log into [email or web address] and use [facility] universal password to access. If you do not have the password, please contact the hospital operator. 3. Locate the [facility] provider you are looking for under [facility] and page to a number that you can be directly reached. 4. If you still have difficulty reaching the provider, please page the [place] (Director on Call) for the Hospitalists listed on amion for assistance.  05/10/2020, 9:41 PM   This document was prepared using Dragon voice recognition software and may contain some unintended transcription errors.

## 2020-05-10 NOTE — Care Management CC44 (Signed)
Condition Code 44 Documentation Completed  Patient Details  Name: ANIEL HUBBLE MRN: [id number] Date of Birth: 1941/05/10   Condition Code 44 given:  Yes Patient signature on Condition Code 44 notice:  Yes Documentation of 2 MD's agreement:  Yes Code 44 added to claim:  Yes    Darrold Span, RN 05/10/2020, 11:17 AM

## 2020-05-10 NOTE — TOC Transition Note (Signed)
Transition of Care [facility]) - CM/SW Discharge Note   Patient Details  Name: Dean Harris MRN: [id number] Date of Birth: 09-19-1940  Transition of [facility]) CM/SW Contact:  Eduard Roux, LCSWA Phone Number: 05/10/2020, 1:29 PM   Clinical Narrative:     Patient will DC to:  [facility] DC Date: 05/10/2020 Family Notified: granddaughter,Ashley Transport By: Sharin Mons   Per MD patient is ready for discharge. RN, patient, and facility notified of DC. Discharge Summary sent to facility. RN given number for [phone]. Ambulance transport requested for patient.   Clinical Social Worker signing off.  Antony Blackbird, MSW, LCSWA Clinical Social Worker   Final next level of care: Assisted Living Barriers to Discharge: Barriers Resolved   Patient Goals and CMS Choice        Discharge Placement              Patient chooses bed at: [facility] Patient to be transferred to facility by: [facility] Name of family member notified: daughter,Ashley Patient and family notified of of transfer: 05/10/20  Discharge Plan and Services In-house Referral: Clinical Social Work                                   Social Determinants of Health (SDOH) Interventions     Readmission Risk Interventions No flowsheet data found.

## 2020-05-10 NOTE — ED Notes (Signed)
Pt refusing to have BP cuff on.

## 2020-05-10 NOTE — ED Triage Notes (Signed)
Per ems they had just dropped patient back off at [facility] after being discharged from here today. As they were pulling out of the drive way they got a call that the patient had falling and hit his head the dresser in his room. Pt isnt on any blood thinners. Pt has dementia. Pt pt only oriented to self at baseline.

## 2020-05-10 NOTE — ED Provider Notes (Addendum)
[facility]   CSN: [id number] Arrival date & time: 05/10/20  1745     History Chief Complaint  Patient presents with  . Fall    Dean Harris is a 79 y.o. male.  HPI    Level 5 caveat for dementia/altered mental status.  79 year old male comes in a chief complaint of fall.  Patient has history of memory deficits, diabetes, CAD.  Patient was just discharged from the hospital.  He was admitted for dehydration.  Per EMS staff, they had just left patient in his room and before they could have left the premises, patient had a mechanical fall.  He allegedly struck hit the [employer]resser.  Patient is having bleeding from his scalp.  I called patient's daughter, Morrie Sheldonshley who is POA.  She has given us consent to repair the laceration.   Past Medical History:  Diagnosis Date  . Chronic stable angina (HCC)   . 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 Depression   . Diabetes mellitus   . Essential hypertension   . Glaucoma   . Grade I diastolic dysfunction   . Hyperlipidemia with target LDL less than 70   . Memory difficulties   . 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 Pneumonia    x2  . PTSD (post-traumatic stress disorder) - with depression symptoms    [facility] [place] [facility]. This has helped his memory issues.  . Thrombocytopenia (HCC) 04/29/2014  . Vitamin D deficiency     Patient Active Problem List   Diagnosis Date Noted  . Acute subdural hematoma (HCC) 05/10/2020  . Hyperlipidemia with target LDL less than 70   . Glaucoma   . Grade I diastolic dysfunction   . Acute on chronic kidney failure (HCC)   . Dementia (HCC)   . AMS (altered mental status) 01/11/2020  . CAP (community acquired pneumonia) 01/10/2020  . Weight loss,  non-intentional 05/20/2019  . Depression, recurrent (HCC) 12/01/2017  . Memory disorder 12/01/2017  . Carotid artery plaque, bilateral; s/p Bilateral CEA 07/24/2016  . Syncope 01/28/2016  . Moderate aortic stenosis 01/28/2016  . Chronic stable angina (HCC) 01/28/2016  . BMI 30.0-30.9,adult 07/09/2015  . Thrombocytopenia (HCC) 04/29/2014  . Essential hypertension, benign 03/03/2014  . Coronary artery disease, non-occlusive   . Sleep apnea 11/30/2010  . Gout 11/30/2010  . Hyperlipidemia associated with type 2 diabetes mellitus (HCC) 11/30/2010  . Rosacea 11/30/2010  . COPD (chronic obstructive pulmonary disease) (HCC) 11/30/2010  . ED (erectile dysfunction) 11/30/2010  . Peripheral neuropathy 11/30/2010  . Type 2 diabetes mellitus (HCC) 09/16/1999    Past Surgical History:  Procedure Laterality Date  . CAROTID ENDARTERECTOMY    . LEFT HEART CATHETERIZATION WITH CORONARY ANGIOGRAM N/A 02/13/2012   Procedure: LEFT HEART CATHETERIZATION WITH CORONARY ANGIOGRAM;  Surgeon: Donato SchultzMark Skains, MD;  [facility];  Service: Cardiovascular: Diffuse mid LAD 50%, 50%pOM1, 50%mRCA -> medical management  . TRANSTHORACIC ECHOCARDIOGRAM  01/2012   EF 55-60%. Mild LVH. No RWMA, mild Aortic Stenosis (mean/peak gradient 13 mmHg/21 mmHg)  . TRANSTHORACIC ECHOCARDIOGRAM  May 2017, May 2019   A) EF 60-65% w/o RWMA. Normal DF for age. Mod AD (mean/peak gradient 24/35 mmHg); b) EF 60-65%, mod LVH, Mod AS (Mean gradient (S): 28 mm Hg. Peak 43 mmHg  .  US CAROTID DOPPLER BILATERAL ([facility] HX) Bilateral 02/05/2016   Stable mild-to-moderate disease with bilateral heterogeneous plaque. RICA - 1-39%, LICA 40-59%. Bilateral subclavian and vertebral arteries normal.       Family History  Problem Relation Age of Onset  . Diabetes Mother   . Stroke Mother   . Hypertension Mother   . Hyperlipidemia Mother   . Cancer Father   . Hypertension Brother   . Cancer Other     Social History   Tobacco Use  .  Smoking status: Former Smoker    Types: Cigarettes    Quit date: 02/10/1988    Years since quitting: 32.2  . Smokeless tobacco: Never Used  Vaping Use  . Vaping Use: Never used  Substance Use Topics  . Alcohol use: No  . Drug use: No    Home Medications Prior to Admission medications   Medication Sig Start Date End Date Taking? Authorizing Provider  acetaminophen (TYLENOL) 500 MG tablet Take 500 mg by mouth every 6 (six) hours as needed (for fever 99.5-101 F, minor headaches or discomfort- report any fever >101 F, severe headache or discomfort).    [provider]  alum & mag hydroxide-simeth (MI-ACID) 200-200-20 MG/5ML suspension Take 30 mLs by mouth every 6 (six) hours as needed for indigestion or heartburn.    [provider]  clonazePAM (KLONOPIN) 0.5 MG tablet Take 0.5 mg by mouth 2 (two) times daily as needed for anxiety.    [provider]  donepezil (ARICEPT) 10 MG tablet TAKE 1/2 (ONE-HALF) TABLET BY MOUTH AT BEDTIME (Needs to be seen before next refill) Patient taking differently: Take 5 mg by mouth at bedtime.  03/22/20   Daphine Deutscher, Mary-Margaret, FNP  guaifenesin (ROBAFEN) 100 MG/5ML syrup Take 200 mg by mouth every 6 (six) hours as needed for cough.    [provider]  isosorbide mononitrate (IMDUR) 30 MG 24 hr tablet Take 1 tablet (30 mg total) by mouth daily. 03/22/20   Daphine Deutscher, Mary-Margaret, FNP  levETIRAcetam (KEPPRA) 500 MG tablet Take 1 tablet (500 mg total) by mouth 2 (two) times daily for 7 days. 05/11/20 05/18/20  Regalado, Belkys A, MD  lisinopril (ZESTRIL) 2.5 MG tablet Take 2 tablets (5 mg total) by mouth daily. 05/11/20   Regalado, Belkys A, MD  loperamide (IMODIUM) 2 MG capsule Take 2 mg by mouth as needed (with each loose stool/diarrhea- Max of 8 doses/24 hours- REPORT ANY BLOOD).    [provider]  magnesium hydroxide (MILK OF MAGNESIA) 400 MG/5ML suspension Take 30 mLs by mouth at bedtime as needed for mild constipation.     [provider]  memantine (NAMENDA) 10 MG tablet Take 1 tablet (10 mg total) by mouth 2 (two) times daily. 03/22/20   Bennie Pierini, FNP  Neomycin-Bacitracin-Polymyxin (TRIPLE ANTIBIOTIC) [phone] OINT Apply 1 application topically See admin instructions. Apply to minor skin tears or abrasions after cleansing with normal saline- cover with Band-Aids or gauze & tape    [provider]  nitroGLYCERIN (NITROSTAT) 0.4 MG SL tablet Use 1 tabket under tongue at onset and may repeat every 5 minutes x2 Patient taking differently: Place 0.4 mg under the tongue every 5 (five) minutes x 3 doses as needed for chest pain (CALL MD, IF NO RELIEF).  02/15/20   Daphine Deutscher, Mary-Margaret, FNP  nystatin (MYCOSTATIN) 100000 UNIT/ML suspension Take 5 mLs (500,000 Units total) by mouth 4 (four) times daily. 05/11/20   Regalado, Belkys A, MD  sertraline (ZOLOFT) 100 MG tablet Take 2  tablets (200 mg total) by mouth in the morning. 03/22/20   Daphine Deutscher, Mary-Margaret, FNP  Travoprost, BAK Free, (TRAVATAN) 0.004 % SOLN ophthalmic solution Place 1 drop into both eyes at bedtime. 03/22/20   Daphine Deutscher Mary-Margaret, FNP    Allergies    Dust mite extract, Mold extract [trichophyton], Pollen extract, and Tetracyclines & related  Review of Systems   Review of Systems  Unable to perform ROS: Dementia    Physical Exam Updated Vital Signs BP 127/73 (BP Location: Right Arm)   Pulse 62   Temp 98 F (36.7 C) (Oral)   Resp 19   SpO2 99%   Physical Exam Vitals and nursing note reviewed.  Constitutional:      Appearance: He is well-developed.  Eyes:     Extraocular Movements: Extraocular movements intact.     Pupils: Pupils are equal, round, and reactive to light.  Cardiovascular:     Rate and Rhythm: Normal rate and regular rhythm.     Heart sounds: Normal heart sounds.  Pulmonary:     Effort: Pulmonary effort is normal. No respiratory distress.     Breath sounds: Normal breath sounds. No wheezing.    Abdominal:     General: Bowel sounds are normal. There is no distension.     Palpations: Abdomen is soft.     Tenderness: There is no abdominal tenderness. There is no guarding or rebound.  Musculoskeletal:        General: No tenderness or deformity.     Cervical back: Normal range of motion and neck supple.  Skin:    General: Skin is warm.     Findings: Rash present.     Comments: Patient has had a 3 cm laceration to the left parietal region and 2 cm laceration to the occiput  Neurological:     Mental Status: He is alert and oriented to person, place, and time.     ED Results / Procedures / Treatments   Labs (all labs ordered are listed, but only abnormal results are displayed) Labs Reviewed  CBC WITH DIFFERENTIAL/PLATELET - Abnormal; Notable for the following components:      Result Value   RBC 4.10 (*)    Hemoglobin 12.1 (*)    HCT 36.4 (*)    Platelets 143 (*)    All other components within normal limits  BASIC METABOLIC PANEL - Abnormal; Notable for the following components:   Glucose, Bld 142 (*)    Calcium 8.6 (*)    All other components within normal limits  CBG MONITORING, ED - Abnormal; Notable for the following components:   Glucose-Capillary 137 (*)    All other components within normal limits    EKG None  Radiology CT HEAD WO CONTRAST  Result Date: 05/11/2020 CLINICAL DATA:  Follow-up subdural hematoma EXAM: CT HEAD WITHOUT CONTRAST TECHNIQUE: Contiguous axial images were obtained from the base of the skull through the vertex without intravenous contrast. COMPARISON:  Yesterday FINDINGS: Brain: Subdural hematoma along the right cerebral convexity measuring up to 4 mm in thickness. No interval progression. Brain atrophy that is advanced at the temporal lobes especially. No evidence of infarct, hydrocephalus, or mass. Vascular: No hyperdense vessel or unexpected calcification. Skull: Normal. Negative for fracture or focal lesion. Sinuses/Orbits: No acute  finding. IMPRESSION: 1. Size stable subdural hematoma along the right cerebral convexity measuring up to 4 mm in thickness. No associated mass effect. 2. Advanced temporal lobe atrophy. Electronically Signed   By: Marnee Spring M.D.   On:  05/11/2020 06:30   CT Head Wo Contrast  Result Date: 05/10/2020 CLINICAL DATA:  Minor head trauma. EXAM: CT HEAD WITHOUT CONTRAST TECHNIQUE: Contiguous axial images were obtained from the base of the skull through the vertex without intravenous contrast. COMPARISON:  05/09/2020 FINDINGS: Brain: Interval development of a right acute subdural hematoma overlying the right temporoparietal region. Hematoma measures approximately 3-4 mm in thickness. No associated midline shift Diffuse loss of parenchymal volume is consistent with atrophy. Patchy low attenuation in the deep hemispheric and periventricular white matter is nonspecific, but likely reflects chronic microvascular ischemic demyelination. Vascular: No hyperdense vessel or unexpected calcification. Skull: No evidence for fracture. No worrisome lytic or sclerotic lesion. Sinuses/Orbits: The visualized paranasal sinuses and mastoid air cells are clear. Visualized portions of the globes and intraorbital fat are unremarkable. Other: None. IMPRESSION: Interval development of a right acute subdural hematoma since yesterday's CT scan. Hematoma measures approximately 3-4 mm in thickness, overlies the right temporoparietal region and generates no substantial mass-effect nor midline shift. Atrophy with chronic small vessel white matter ischemic disease. Electronically Signed   By: Kennith Center M.D.   On: 05/10/2020 19:17   CT Cervical Spine Wo Contrast  Result Date: 05/10/2020 CLINICAL DATA:  Fall, neck pain EXAM: CT CERVICAL SPINE WITHOUT CONTRAST TECHNIQUE: Multidetector CT imaging of the cervical spine was performed without intravenous contrast. Multiplanar CT image reconstructions were also generated. COMPARISON:  None.  FINDINGS: Alignment: Normal Skull base and vertebrae: No acute fracture. No primary bone lesion or focal pathologic process. Soft tissues and spinal canal: No prevertebral fluid or swelling. No visible canal hematoma. Disc levels: Diffuse degenerative disc disease with disc space narrowing and spurring from C4-5 through C7-T1. Diffuse bilateral degenerative facet disease. Upper chest: No acute findings Other: Carotid artery calcifications. IMPRESSION: Diffuse degenerative changes.  No acute bony abnormality. Electronically Signed   By: Charlett Nose M.D.   On: 05/10/2020 21:21    Procedures .Marland KitchenLaceration Repair  Date/Time: 05/10/2020 8:49 PM Performed by: Derwood Kaplan, MD Authorized by: Derwood Kaplan, MD   Consent:    Consent obtained:  Verbal   Consent given by:  Guardian   Risks discussed:  Pain, poor cosmetic result, need for additional repair and poor wound healing   Alternatives discussed:  No treatment Universal protocol:    Procedure explained and questions answered to patient or proxy's satisfaction: yes     Patient identity confirmed:  Arm band Anesthesia (see MAR for exact dosages):    Anesthesia method:  None Laceration details:    Location:  Scalp   Scalp location:  L temporal   Length (cm):  5   Depth (mm):  2 Repair type:    Repair type:  Simple Exploration:    Wound exploration: wound explored through full range of motion     Contaminated: no   Treatment:    Area cleansed with:  Saline   Amount of cleaning:  Standard   Irrigation method:  Syringe Skin repair:    Repair method:  Staples   Number of staples:  3 Approximation:    Approximation:  Loose Post-procedure details:    Patient tolerance of procedure:  Tolerated well, no immediate complications .Critical Care Performed by: Derwood Kaplan, MD Authorized by: Derwood Kaplan, MD   Critical care provider statement:    Critical care time (minutes):  52   Critical care was necessary to treat or prevent  imminent or life-threatening deterioration of the following conditions:  Trauma and CNS failure or compromise  Critical care was time spent personally by me on the following activities:  Discussions with consultants, evaluation of patient's response to treatment, examination of patient, ordering and performing treatments and interventions, ordering and review of laboratory studies, ordering and review of radiographic studies, pulse oximetry, re-evaluation of patient's condition, obtaining history from patient or surrogate and review of old charts   (including critical care time)  Medications Ordered in ED Medications  levETIRAcetam (KEPPRA) IVPB 1000 mg/100 mL premix (0 mg Intravenous Stopped 05/10/20 2150)    ED Course  I have reviewed the triage vital signs and the nursing notes.  Pertinent labs & imaging results that were available during my care of the patient were reviewed by me and considered in my medical decision making (see chart for details).  Clinical Course as of May 12 1499  Thu May 10, 2020  2052 Small subdural noted.  Neurosurgery consulted and they recommend repeat CT scan in the morning.  CT HEAD WO CONTRAST [AN]    Clinical Course User Index [AN] Derwood Kaplan, MD   MDM Rules/Calculators/A&P                          79 year old comes in a chief complaint of fall. Patient has laceration to the scalp at 2 different regions.  The laceration was repaired without any issues.  CT scan of the brain was ordered and unfortunately there is a subdural hematoma.  Case discussed with Dr. Jake Samples, neurosurgery.  He would like patient to get a repeat CT scan in the morning.  Neuro exam was repeated after the CT scan.  Patient continues to move all 4 extremities.  He is responding to verbal stimuli -but not giving appropriate answers.  Gross sensory exam appears normal.  His response to the laceration repair was also normal.  We will consult hospitalist for admission.  Final  Clinical Impression(s) / ED Diagnoses Final diagnoses:  Subdural hematoma (HCC)  Laceration of scalp, initial encounter    Rx / DC Orders ED Discharge Orders         Ordered    levETIRAcetam (KEPPRA) 500 MG tablet  2 times daily        05/11/20 1314    lisinopril (ZESTRIL) 2.5 MG tablet  Daily        05/11/20 1314    nystatin (MYCOSTATIN) 100000 UNIT/ML suspension  4 times daily        05/11/20 1314    Increase activity slowly        05/11/20 1314    Diet - low sodium heart healthy        05/11/20 1314           Derwood Kaplan, MD 05/10/20 2052    Derwood Kaplan, MD 05/12/20 1500

## 2020-05-11 ENCOUNTER — Observation Stay (HOSPITAL_COMMUNITY): Payer: Medicare PPO

## 2020-05-11 DIAGNOSIS — I6201 Nontraumatic acute subdural hemorrhage: Secondary | ICD-10-CM | POA: Diagnosis not present

## 2020-05-11 DIAGNOSIS — R410 Disorientation, unspecified: Secondary | ICD-10-CM | POA: Diagnosis not present

## 2020-05-11 DIAGNOSIS — G319 Degenerative disease of nervous system, unspecified: Secondary | ICD-10-CM | POA: Diagnosis not present

## 2020-05-11 DIAGNOSIS — Z7401 Bed confinement status: Secondary | ICD-10-CM | POA: Diagnosis not present

## 2020-05-11 DIAGNOSIS — M2550 Pain in unspecified joint: Secondary | ICD-10-CM | POA: Diagnosis not present

## 2020-05-11 DIAGNOSIS — S065X0A Traumatic subdural hemorrhage without loss of consciousness, initial encounter: Secondary | ICD-10-CM | POA: Diagnosis not present

## 2020-05-11 DIAGNOSIS — S065X9A Traumatic subdural hemorrhage with loss of consciousness of unspecified duration, initial encounter: Secondary | ICD-10-CM | POA: Diagnosis not present

## 2020-05-11 DIAGNOSIS — F29 Unspecified psychosis not due to a substance or known physiological condition: Secondary | ICD-10-CM | POA: Diagnosis not present

## 2020-05-11 DIAGNOSIS — R4182 Altered mental status, unspecified: Secondary | ICD-10-CM | POA: Diagnosis not present

## 2020-05-11 MED ORDER — LISINOPRIL 2.5 MG PO TABS
2.5000 mg | ORAL_TABLET | Freq: Every day | ORAL | Status: DC
  Administered 2020-05-11: 2.5 mg via ORAL
  Filled 2020-05-11: qty 1

## 2020-05-11 MED ORDER — NITROGLYCERIN 0.4 MG SL SUBL: 0.4000 mg | SUBLINGUAL_TABLET | SUBLINGUAL | Status: DC | PRN

## 2020-05-11 MED ORDER — NYSTATIN 100000 UNIT/ML MT SUSP: 5.0000 mL | Freq: Four times a day (QID) | OROMUCOSAL | 0 refills | Status: DC

## 2020-05-11 MED ORDER — SERTRALINE HCL 100 MG PO TABS
200.0000 mg | ORAL_TABLET | Freq: Every morning | ORAL | Status: DC
  Administered 2020-05-11: 200 mg via ORAL
  Filled 2020-05-11: qty 2

## 2020-05-11 MED ORDER — MEMANTINE HCL 10 MG PO TABS
10.0000 mg | ORAL_TABLET | Freq: Two times a day (BID) | ORAL | Status: DC
  Administered 2020-05-11 (×2): 10 mg via ORAL
  Filled 2020-05-11 (×2): qty 1

## 2020-05-11 MED ORDER — ALUM & MAG HYDROXIDE-SIMETH 200-200-20 MG/5ML PO SUSP: 30.0000 mL | Freq: Four times a day (QID) | ORAL | Status: DC | PRN

## 2020-05-11 MED ORDER — NYSTATIN 100000 UNIT/ML MT SUSP
5.0000 mL | Freq: Four times a day (QID) | OROMUCOSAL | Status: DC
  Administered 2020-05-11 (×3): 500000 [IU] via ORAL
  Filled 2020-05-11 (×4): qty 5

## 2020-05-11 MED ORDER — LISINOPRIL 2.5 MG PO TABS
5.0000 mg | ORAL_TABLET | Freq: Every day | ORAL | 0 refills | Status: DC
Start: 2020-05-11 — End: 2020-08-06

## 2020-05-11 MED ORDER — ISOSORBIDE MONONITRATE ER 30 MG PO TB24
30.0000 mg | ORAL_TABLET | Freq: Every day | ORAL | Status: DC
  Administered 2020-05-11: 30 mg via ORAL
  Filled 2020-05-11: qty 1

## 2020-05-11 MED ORDER — GUAIFENESIN 100 MG/5ML PO SOLN: 5.0000 mL | Freq: Four times a day (QID) | ORAL | Status: DC | PRN

## 2020-05-11 MED ORDER — GUAIFENESIN 100 MG/5ML PO SYRP
200.0000 mg | ORAL_SOLUTION | Freq: Four times a day (QID) | ORAL | Status: DC | PRN
  Filled 2020-05-11: qty 10

## 2020-05-11 MED ORDER — LEVETIRACETAM 500 MG PO TABS: 500.0000 mg | ORAL_TABLET | Freq: Two times a day (BID) | ORAL | 0 refills | Status: DC

## 2020-05-11 MED ORDER — LATANOPROST 0.005 % OP SOLN
1.0000 [drp] | Freq: Every day | OPHTHALMIC | Status: DC
  Administered 2020-05-11: 1 [drp] via OPHTHALMIC
  Filled 2020-05-11: qty 2.5

## 2020-05-11 MED ORDER — LOPERAMIDE HCL 2 MG PO CAPS: 2.0000 mg | ORAL_CAPSULE | ORAL | Status: DC | PRN

## 2020-05-11 MED ORDER — CLONAZEPAM 0.5 MG PO TABS: 0.5000 mg | ORAL_TABLET | Freq: Two times a day (BID) | ORAL | Status: DC | PRN

## 2020-05-11 MED ORDER — METOPROLOL SUCCINATE ER 25 MG PO TB24
12.5000 mg | ORAL_TABLET | Freq: Every day | ORAL | Status: DC
  Administered 2020-05-11: 12.5 mg via ORAL
  Filled 2020-05-11: qty 1

## 2020-05-11 MED ORDER — DONEPEZIL HCL 5 MG PO TABS
5.0000 mg | ORAL_TABLET | Freq: Every day | ORAL | Status: DC
  Administered 2020-05-11: 5 mg via ORAL
  Filled 2020-05-11: qty 1

## 2020-05-11 MED ORDER — MAGNESIUM HYDROXIDE 400 MG/5ML PO SUSP: 30.0000 mL | Freq: Every evening | ORAL | Status: DC | PRN

## 2020-05-11 NOTE — Consult Note (Signed)
   Providing Compassionate, Quality Care - Together  Neurosurgery Consult  Referring physician: Dr. Rhunette Croft Reason for referral: SDH  Chief Complaint: Fall  History of Present Illness: This is a 79 year old male with a history of dementia, angina, CAD, diabetes, hyperlipidemia, aortic stenosis there was brought back to the emergency department after being discharged yesterday and having a ground-level fall and hitting his head while being transported back to his assisted living.  He was brought back to the emergency department in which a CT brain was obtained which showed a small right-sided acute subdural hematoma with no mass-effect.  He is a poor historian due to his dementia and will not communicate therefore the history is per the chart.    Medications: I have reviewed the patient's current medications. Allergies: No Known Allergies  History reviewed. No pertinent family history. Social History:  has no history on file for tobacco use, alcohol use, and drug use.  ROS: Unable to obtain  Physical Exam:  Vital signs in last 24 hours: Temp:  [98 F (36.7 C)-98.3 F (36.8 C)] 98 F (36.7 C) (07/25 1814) Pulse Rate:  [58-128] 65 (07/26 0746) Resp:  [11-18] 14 (07/26 0217) BP: (138-182)/(65-125) 153/88 (07/26 0700) SpO2:  [91 %-98 %] 96 % (07/26 0746) PE: Awake alert Tracks bilaterally PERRL Face symmetric Moving all extremities equally and spontaneously Sensory to light touch appears intact There is noncommunicative but will groan or scream, will not follow commands   Impression/Assessment:  79 year old male with  1.  Right acute subdural hematoma without mass-effect status post fall  Plan:  -Repeat CT this a.m. is stable -No acute neurosurgical intervention -I recommend holding his aspirin for 1 week -He can follow-up with me in 2 to 3 weeks with a repeat CT scan of the brain -Keppra x7 days, 500 twice daily   Thank you for allowing me to participate in this  patient's care.  Please do not hesitate to call with questions or concerns.   Monia Pouch, DO Neurosurgeon [facility] Cell: [phone]

## 2020-05-11 NOTE — TOC Transition Note (Signed)
Transition of Care [facility]) - CM/SW Discharge Note   Patient Details  Name: Dean Harris MRN: [id number] Date of Birth: 08-04-1941  Transition of Care [facility]) CM/SW Contact:  Baldemar Lenis, LCSW Phone Number: 05/11/2020, 3:35 PM   Clinical Narrative:   Nurse to call report to [phone]    Final next level of care: Memory Care Barriers to Discharge: Barriers Resolved   Patient Goals and CMS Choice Patient states their goals for this hospitalization and ongoing recovery are:: patient unable to participate in goal setting due to disorientation CMS Medicare.gov Compare Post Acute Care list provided to:: Patient Represenative (must comment) Choice offered to / list presented to : Adult Children  Discharge Placement              Patient chooses bed at: [facility] Patient to be transferred to facility by: [facility] Name of family member notified: [name] Patient and family notified of of transfer: 05/11/20  Discharge Plan and Services                          HH Arranged: PT, OT [facility] Agency: [facility] Date [facility] Agency Contacted: 05/11/20      Social Determinants of Health (SDOH) Interventions     Readmission Risk Interventions No flowsheet data found.

## 2020-05-11 NOTE — Plan of Care (Signed)

## 2020-05-11 NOTE — [place] FL2 (Signed)
[place] MEDICAID FL2 LEVEL OF CARE SCREENING TOOL     IDENTIFICATION  Patient Name: Dean Harris Birthdate: 05-Jun-1941 Sex: male Admission Date (Current Location): 05/10/2020  [facility] and [place] Number:  Producer, television/film/video and Address:  [facility], [address] [place], [place] [address]      Provider Number: [id number]  Attending Physician Name and Address:  Alba Cory, MD  Relative Name and Phone Number:       Current Level of Care: Hospital Recommended Level of Care: Memory Care Prior Approval Number:    Date Approved/Denied:   PASRR Number:    Discharge Plan: Other (Comment) (Memory Care)    Current Diagnoses: Patient Active Problem List   Diagnosis Date Noted  . Acute subdural hematoma (HCC) 05/10/2020  . Hyperlipidemia with target LDL less than 70   . Glaucoma   . Grade I diastolic dysfunction   . Acute on chronic kidney failure (HCC)   . Dementia (HCC)   . AMS (altered mental status) 01/11/2020  . CAP (community acquired pneumonia) 01/10/2020  . Weight loss, non-intentional 05/20/2019  . Depression, recurrent (HCC) 12/01/2017  . Memory disorder 12/01/2017  . Carotid artery plaque, bilateral; s/p Bilateral CEA 07/24/2016  . Syncope 01/28/2016  . Moderate aortic stenosis 01/28/2016  . Chronic stable angina (HCC) 01/28/2016  . BMI 30.0-30.9,adult 07/09/2015  . Thrombocytopenia (HCC) 04/29/2014  . Essential hypertension, benign 03/03/2014  . Coronary artery disease, non-occlusive   . Sleep apnea 11/30/2010  . Gout 11/30/2010  . Hyperlipidemia associated with type 2 diabetes mellitus (HCC) 11/30/2010  . Rosacea 11/30/2010  . COPD (chronic obstructive pulmonary disease) (HCC) 11/30/2010  . ED (erectile dysfunction) 11/30/2010  . Peripheral neuropathy 11/30/2010  . Type 2 diabetes mellitus (HCC) 09/16/1999    Orientation RESPIRATION BLADDER Height & Weight     Self  Normal Incontinent Weight:   Height:      BEHAVIORAL SYMPTOMS/MOOD NEUROLOGICAL BOWEL NUTRITION STATUS      Incontinent Diet (see DC summary)  AMBULATORY STATUS COMMUNICATION OF NEEDS Skin   Limited Assist Verbally Normal                       Personal Care Assistance Level of Assistance  Bathing, Feeding, Dressing Bathing Assistance: Limited assistance Feeding assistance: Limited assistance Dressing Assistance: Limited assistance     Functional Limitations Info  Sight, Hearing, Speech Sight Info: Adequate Hearing Info: Adequate Speech Info: Adequate    SPECIAL CARE FACTORS FREQUENCY  PT (By licensed PT), OT (By licensed OT)     PT Frequency: 3x/wk with home health OT Frequency: 3x/wk with home health            Contractures Contractures Info: Not present    Additional Factors Info  Code Status, Allergies Code Status Info: Full Allergies Info: Dust Mite Extract, Mold Extract (Trichophyton), Pollen Extract, Tetracyclines & Related           Current Medications (05/11/2020):  This is the current hospital active medication list Current Facility-Administered Medications  Medication Dose Route Frequency Provider Last Rate Last Admin  . acetaminophen (TYLENOL) tablet 650 mg  650 mg Oral Q6H PRN Bobette Mo, MD       Or  . acetaminophen (TYLENOL) suppository 650 mg  650 mg Rectal Q6H PRN Bobette Mo, MD      . alum & mag hydroxide-simeth (MAALOX/MYLANTA) 200-200-20 MG/5ML suspension 30 mL  30 mL Oral Q6H PRN Robb Matar,  Ernestene Kiel, MD      . clonazePAM [facility]) tablet 0.5 mg  0.5 mg Oral BID PRN Bobette Mo, MD      . donepezil (ARICEPT) tablet 5 mg  5 mg Oral QHS Bobette Mo, MD      . guaiFENesin (ROBITUSSIN) 100 MG/5ML solution 100 mg  5 mL Oral Q6H PRN Regalado, Belkys A, MD      . isosorbide mononitrate (IMDUR) 24 hr tablet 30 mg  30 mg Oral Daily Bobette Mo, MD   30 mg at 05/11/20 0934  . latanoprost (XALATAN) 0.005 % ophthalmic solution 1 drop  1 drop Both Eyes  QHS Bobette Mo, MD      . levETIRAcetam (KEPPRA) tablet 500 mg  500 mg Oral BID Bobette Mo, MD   500 mg at 05/11/20 0935  . lisinopril (ZESTRIL) tablet 2.5 mg  2.5 mg Oral Daily Regalado, Belkys A, MD      . loperamide (IMODIUM) capsule 2 mg  2 mg Oral PRN Bobette Mo, MD      . magnesium hydroxide (MILK OF MAGNESIA) suspension 30 mL  30 mL Oral QHS PRN Bobette Mo, MD      . memantine [facility]) tablet 10 mg  10 mg Oral BID Bobette Mo, MD   10 mg at 05/11/20 0935  . nitroGLYCERIN (NITROSTAT) SL tablet 0.4 mg  0.4 mg Sublingual Q5 Min x 3 PRN Bobette Mo, MD      . nystatin (MYCOSTATIN) 100000 UNIT/ML suspension 500,000 Units  5 mL Oral QID Regalado, Belkys A, MD   500,000 Units at 05/11/20 1255  . sertraline (ZOLOFT) tablet 200 mg  200 mg Oral q AM Bobette Mo, MD   200 mg at 05/11/20 0935     Discharge Medications: STOP taking these medications   aspirin EC 81 MG tablet     TAKE these medications   acetaminophen 500 MG tablet Commonly known as: TYLENOL Take 500 mg by mouth every 6 (six) hours as needed (for fever 99.5-101 F, minor headaches or discomfort- report any fever >101 F, severe headache or discomfort).   clonazePAM 0.5 MG tablet Commonly known as: KLONOPIN Take 0.5 mg by mouth 2 (two) times daily as needed for anxiety.   donepezil 10 MG tablet Commonly known as: ARICEPT TAKE 1/2 (ONE-HALF) TABLET BY MOUTH AT BEDTIME (Needs to be seen before next refill) What changed:   how much to take  how to take this  when to take this  additional instructions   isosorbide mononitrate 30 MG 24 hr tablet Commonly known as: IMDUR Take 1 tablet (30 mg total) by mouth daily.   levETIRAcetam 500 MG tablet Commonly known as: KEPPRA Take 1 tablet (500 mg total) by mouth 2 (two) times daily for 7 days.   lisinopril 2.5 MG tablet Commonly known as: ZESTRIL Take 2 tablets (5 mg total) by mouth daily.   loperamide  2 MG capsule Commonly known as: IMODIUM Take 2 mg by mouth as needed (with each loose stool/diarrhea- Max of 8 doses/24 hours- REPORT ANY BLOOD).   memantine 10 MG tablet Commonly known as: NAMENDA Take 1 tablet (10 mg total) by mouth 2 (two) times daily.   Mi-Acid 200-200-20 MG/5ML suspension Generic drug: alum & mag hydroxide-simeth Take 30 mLs by mouth every 6 (six) hours as needed for indigestion or heartburn.   Milk of Magnesia 400 MG/5ML suspension Generic drug: magnesium hydroxide Take 30 mLs by mouth at  bedtime as needed for mild constipation.   nitroGLYCERIN 0.4 MG SL tablet Commonly known as: NITROSTAT Use 1 tabket under tongue at onset and may repeat every 5 minutes x2 What changed:   how much to take  how to take this  when to take this  reasons to take this  additional instructions   nystatin 100000 UNIT/ML suspension Commonly known as: MYCOSTATIN Take 5 mLs (500,000 Units total) by mouth 4 (four) times daily.   Robafen 100 MG/5ML syrup Generic drug: guaifenesin Take 200 mg by mouth every 6 (six) hours as needed for cough.   sertraline 100 MG tablet Commonly known as: ZOLOFT Take 2 tablets (200 mg total) by mouth in the morning.   Travoprost (BAK Free) 0.004 % Soln ophthalmic solution Commonly known as: TRAVATAN Place 1 drop into both eyes at bedtime.   Triple Antibiotic [phone] Oint Apply 1 application topically See admin instructions. Apply to minor skin tears or abrasions after cleansing with normal saline- cover with Band-Aids or gauze & tape     Relevant Imaging Results:  Relevant Lab Results:   Additional Information SS#: [id number]  Baldemar Lenis, LCSW

## 2020-05-11 NOTE — Evaluation (Addendum)
I agree with the following treatment note after review of the documentation. This session was performed under the supervision of a licensed clinician.   Cindee Salt, DPT  Acute Rehabilitation Services  Pager: [phone]  Physical Therapy Evaluation Patient Details Name: Dean Harris MRN: [id number] DOB: [birth year] Today's Date: 05/11/2020   History of Present Illness  Pt is a 79 yo male presenting after sustaining a fall and hitting his head. CT noted R subdural hematoma.  Pt was just released from hospital on 8/26 after syncopal episode and recent dx with UTI. PMH includes: dementia, CAD, bilateral carotid endarterectomy, HLD, depression, anxiety, HTN, PTSD, PNA, DM.  Clinical Impression  Pt was evaluated and assessed for the above diagnosis and impairments below. Pt required max assist +2 for all mobility tasks. Noted to be resistitive to movement throughout. Pt has cognitive and expressive deficits. Family states that he will be returning back to [facility] in memory care unit at d/c. Pt would continue to benefit from acute therapy as a trial basis to continue to improve independence with functional tasks. Will continue to follow acutely     Follow Up Recommendations Supervision/Assistance - 24 hour;Other (comment) (return to memory care unit)    Equipment Recommendations  None recommended by PT    Recommendations for Other Services       Precautions / Restrictions Precautions Precautions: Fall Restrictions Weight Bearing Restrictions: No      Mobility  Bed Mobility Overal bed mobility: Needs Assistance Bed Mobility: Rolling;Sidelying to Sit;Sit to Supine Rolling: Max assist Sidelying to sit: Max assist;+2 for physical assistance;HOB elevated   Sit to supine: Max assist;+2 for physical assistance;HOB elevated   General bed mobility comments: pt required max assist +2 for trunk and BLE assist. Pt resistive throughout and attempted to lie back multiple times prior to  end of session.   Transfers Overall transfer level: Needs assistance Equipment used: 2 person hand held assist Transfers: Sit to/from Stand Sit to Stand: Max assist;+2 physical assistance         General transfer comment: pt required max assist +2 for lift assist with sit<>stand. Noted posterior lean in standing. Pt also resistive during transfer, so further mobility deferred.   Ambulation/Gait                Stairs            Wheelchair Mobility    Modified Rankin (Stroke Patients Only)       Balance Overall balance assessment: Needs assistance Sitting-balance support: Feet supported;No upper extremity supported Sitting balance-Leahy Scale: Poor Sitting balance - Comments: pt required min guard to mod assist while sitting EOB Postural control: Right lateral lean;Posterior lean Standing balance support: No upper extremity supported Standing balance-Leahy Scale: Zero Standing balance comment: Pt required max assist +2 in standing with a notable posterior lean                             Pertinent Vitals/Pain Pain Assessment: Faces Faces Pain Scale: Hurts a little bit Pain Location: head Pain Descriptors / Indicators: Grimacing Pain Intervention(s): Monitored during session    Home Living Family/patient expects to be discharged to:: Other (Comment)                 Additional Comments: Memory Care Unit: [facility]    Prior Function Level of Independence: Needs assistance   Gait / Transfers Assistance Needed: walking with assist with RW for  limited distances. He is also pushed in [facility] at memory care unit  ADL's / Homemaking Assistance Needed: dependent for ADLs, can feed self with supervision        Hand Dominance        Extremity/Trunk Assessment   Upper Extremity Assessment Upper Extremity Assessment: Generalized weakness    Lower Extremity Assessment Lower Extremity Assessment: Generalized weakness    Cervical /  Trunk Assessment Cervical / Trunk Assessment: Normal  Communication   Communication: Expressive difficulties  Cognition Arousal/Alertness: Awake/alert Behavior During Therapy: Flat affect Overall Cognitive Status: History of cognitive impairments - at baseline              General Comments: baseline dementia and alzheimer's. Pt was also with expressive difficulties making hime difficult to understand      General Comments General comments (skin integrity, edema, etc.): mother-in-law of [name], pt's granddaughter was the historian and assisted with answering questions    Exercises     Assessment/Plan    PT Assessment Patient needs continued PT services  PT Problem List Decreased strength;Decreased range of motion;Decreased activity tolerance;Decreased balance;Decreased mobility;Decreased coordination;Decreased cognition;Decreased knowledge of use of DME;Decreased safety awareness       PT Treatment Interventions DME instruction;Gait training;Stair training;Functional mobility training;Therapeutic activities;Therapeutic exercise;Balance training;Neuromuscular re-education;Cognitive remediation;Patient/family education;Wheelchair mobility training    PT Goals (Current goals can be found in the Care Plan section)  Acute Rehab PT Goals Patient Stated Goal: return to [facility] PT Goal Formulation: With family Time For Goal Achievement: 05/25/20 Potential to Achieve Goals: Fair    Frequency Min 2X/week   Barriers to discharge        Co-evaluation               AM-PAC PT "6 Clicks" Mobility  Outcome Measure Help needed turning from your back to your side while in a flat bed without using bedrails?: A Lot Help needed moving from lying on your back to sitting on the side of a flat bed without using bedrails?: Total Help needed moving to and from a bed to a chair (including a wheelchair)?: Total Help needed standing up from a chair using your arms (e.g., wheelchair  or bedside chair)?: Total Help needed to walk in hospital room?: Total Help needed climbing 3-5 steps with a railing? : Total 6 Click Score: 7    End of Session Equipment Utilized During Treatment: Gait belt Activity Tolerance: Patient tolerated treatment well Patient left: in bed;with call bell/phone within reach;with bed alarm set;with family/visitor present Nurse Communication: Mobility status (condem cath replacement) PT Visit Diagnosis: History of falling (Z91.81);Muscle weakness (generalized) (M62.81);Unsteadiness on feet (R26.81)    Time: 2025-4270 PT Time Calculation (min) (ACUTE ONLY): 23 min   Charges:   PT Evaluation $PT Eval Moderate Complexity: 1 Mod PT Treatments $Therapeutic Activity: 8-22 mins       Harmon Pier, SPT  Acute Rehabilitation Services  Office: [phone]  05/11/2020, 2:19 PM

## 2020-05-11 NOTE — Progress Notes (Addendum)
Pt's IV removed; site is clean, dry and intact. Called and gave report to Asher Muir, Charity fundraiser at [employer] at [phone]. No personal belongings noted at bedside. Awaiting transport to receiving facility from [facility].

## 2020-05-11 NOTE — Discharge Summary (Signed)
Physician Discharge Summary  Dean Harris ZOX:[id number] DOB: 08/24/1941 DOA: 05/10/2020  PCP: Bennie Pierini, FNP  Admit date: 05/10/2020 Discharge date: 05/11/2020  Admitted From:SNF Disposition: SNF, memory unit  Recommendations for Outpatient Follow-up:  1. Follow up with PCP in 1-2 weeks 2. Please obtain BMP/CBC in one week 3. Need to follow up with Dr.  Jake Samples in 2--3 weeks. (neurosurgery) 4. No blood thinner.  5. Need Keppra for 7 days./  6. Monitor for any neurological changes or Mental status changes.  7. Fall precaution.     Discharge Condition: Stable.  CODE STATUS: Full code Diet recommendation: Heart Healthy   Brief/Interim Summary:  79 year old with past medical history significant for chronic stable angina, CAD, diabetes type 2, hyperlipidemia, moderate calcific aortic stenosis, PTSD depression, dementia, thrombocytopenia, vitamin D deficiency who was brought to the ED after being discharged earlier to [facility] and having a fall on arrival to facility hitting his head with a dresser in his room. Patient was unable to provide further history.  CT head shows subdural hematoma that was not present on prior CT head.  1-Acute subdural hematoma: CT head; 8/26; Acute sub dural hematoma. Hematoma measures approximately 3-4 mm in thickness, overlies the right temporoparietal region and generates no substantial mass-effect nor midline shift.  CT Head; 8/27;Size stable subdural hematoma along the right cerebral convexity measuring up to 4 mm in thickness. Stable.  Appreciate neurosurgery evaluation. Patient needs to follow up with Dr Jake Samples in 2 weeks for repeat CT scan.  Monitor for Mental status changes or neurological changes.  Avoid aspirin, NSAID.  Stable for discharge.   2-CAD: Holding aspirin. Hold for a tleast 1 week due to subdural hematoma.  Continue with Imdur.  No BB due to bradycardia.   3-HTN; Plan to discharge on lisinopril 5 mg daily.  Hold  on start  BB due to bradycardia.   4-Chronic Diastolic dysfunction;  compensated. No need for diuretics.   Diabetes Type 2;  CBG well controlled.   COPD; compensated.   Depression; Continue with Aricept, Namenda.   Glaucoma;  Continue with travatan drops.   Recent admission for Syncope, AKI; Syncope thought to be related to hypotension from hypovolemia.   Discharge Diagnoses:  Principal Problem:   Acute subdural hematoma (HCC) Active Problems:   Type 2 diabetes mellitus (HCC)   COPD (chronic obstructive pulmonary disease) (HCC)   Coronary artery disease, non-occlusive   Essential hypertension, benign   Depression, recurrent (HCC)   Dementia (HCC)   Glaucoma   Grade I diastolic dysfunction    Discharge Instructions  Discharge Instructions    Diet - low sodium heart healthy   Complete by: As directed    Increase activity slowly   Complete by: As directed      Allergies as of 05/11/2020      Reactions   Dust Mite Extract Other (See Comments)   Sneezing, coughing   Mold Extract [trichophyton] Other (See Comments)   Sneezing, coughing   Pollen Extract Other (See Comments)   Sneezing, coughing   Tetracyclines & Related Other (See Comments)   Generic only      Medication List    STOP taking these medications   aspirin EC 81 MG tablet     TAKE these medications   acetaminophen 500 MG tablet Commonly known as: TYLENOL Take 500 mg by mouth every 6 (six) hours as needed (for fever 99.5-101 F, minor headaches or discomfort- report any fever >101 F, severe headache or discomfort).  clonazePAM 0.5 MG tablet Commonly known as: KLONOPIN Take 0.5 mg by mouth 2 (two) times daily as needed for anxiety.   donepezil 10 MG tablet Commonly known as: ARICEPT TAKE 1/2 (ONE-HALF) TABLET BY MOUTH AT BEDTIME (Needs to be seen before next refill) What changed:   how much to take  how to take this  when to take this  additional instructions   isosorbide  mononitrate 30 MG 24 hr tablet Commonly known as: IMDUR Take 1 tablet (30 mg total) by mouth daily.   levETIRAcetam 500 MG tablet Commonly known as: KEPPRA Take 1 tablet (500 mg total) by mouth 2 (two) times daily for 7 days.   lisinopril 2.5 MG tablet Commonly known as: ZESTRIL Take 2 tablets (5 mg total) by mouth daily.   loperamide 2 MG capsule Commonly known as: IMODIUM Take 2 mg by mouth as needed (with each loose stool/diarrhea- Max of 8 doses/24 hours- REPORT ANY BLOOD).   memantine 10 MG tablet Commonly known as: NAMENDA Take 1 tablet (10 mg total) by mouth 2 (two) times daily.   Mi-Acid 200-200-20 MG/5ML suspension Generic drug: alum & mag hydroxide-simeth Take 30 mLs by mouth every 6 (six) hours as needed for indigestion or heartburn.   Milk of Magnesia 400 MG/5ML suspension Generic drug: magnesium hydroxide Take 30 mLs by mouth at bedtime as needed for mild constipation.   nitroGLYCERIN 0.4 MG SL tablet Commonly known as: NITROSTAT Use 1 tabket under tongue at onset and may repeat every 5 minutes x2 What changed:   how much to take  how to take this  when to take this  reasons to take this  additional instructions   nystatin 100000 UNIT/ML suspension Commonly known as: MYCOSTATIN Take 5 mLs (500,000 Units total) by mouth 4 (four) times daily.   Robafen 100 MG/5ML syrup Generic drug: guaifenesin Take 200 mg by mouth every 6 (six) hours as needed for cough.   sertraline 100 MG tablet Commonly known as: ZOLOFT Take 2 tablets (200 mg total) by mouth in the morning.   Travoprost (BAK Free) 0.004 % Soln ophthalmic solution Commonly known as: TRAVATAN Place 1 drop into both eyes at bedtime.   Triple Antibiotic [phone] Oint Apply 1 application topically See admin instructions. Apply to minor skin tears or abrasions after cleansing with normal saline- cover with Band-Aids or gauze & tape       Follow-up Information    Dawley, Troy C, DO Follow  up in 3 week(s).   Why: call for appointment, will need CT brain beforehand Contact information: [address] [place] [address] [phone]              Allergies  Allergen Reactions  . Dust Mite Extract Other (See Comments)    Sneezing, coughing  . Mold Extract [Trichophyton] Other (See Comments)    Sneezing, coughing  . Pollen Extract Other (See Comments)    Sneezing, coughing  . Tetracyclines & Related Other (See Comments)    Generic only    Consultations:  Neurosurgery    Procedures/Studies: CT HEAD WO CONTRAST  Result Date: 05/11/2020 CLINICAL DATA:  Follow-up subdural hematoma EXAM: CT HEAD WITHOUT CONTRAST TECHNIQUE: Contiguous axial images were obtained from the base of the skull through the vertex without intravenous contrast. COMPARISON:  Yesterday FINDINGS: Brain: Subdural hematoma along the right cerebral convexity measuring up to 4 mm in thickness. No interval progression. Brain atrophy that is advanced at the temporal lobes especially. No evidence of  infarct, hydrocephalus, or mass. Vascular: No hyperdense vessel or unexpected calcification. Skull: Normal. Negative for fracture or focal lesion. Sinuses/Orbits: No acute finding. IMPRESSION: 1. Size stable subdural hematoma along the right cerebral convexity measuring up to 4 mm in thickness. No associated mass effect. 2. Advanced temporal lobe atrophy. Electronically Signed   By: Marnee Spring M.D.   On: 05/11/2020 06:30   CT Head Wo Contrast  Result Date: 05/10/2020 CLINICAL DATA:  Minor head trauma. EXAM: CT HEAD WITHOUT CONTRAST TECHNIQUE: Contiguous axial images were obtained from the base of the skull through the vertex without intravenous contrast. COMPARISON:  05/09/2020 FINDINGS: Brain: Interval development of a right acute subdural hematoma overlying the right temporoparietal region. Hematoma measures approximately 3-4 mm in thickness. No associated midline shift Diffuse loss of  parenchymal volume is consistent with atrophy. Patchy low attenuation in the deep hemispheric and periventricular white matter is nonspecific, but likely reflects chronic microvascular ischemic demyelination. Vascular: No hyperdense vessel or unexpected calcification. Skull: No evidence for fracture. No worrisome lytic or sclerotic lesion. Sinuses/Orbits: The visualized paranasal sinuses and mastoid air cells are clear. Visualized portions of the globes and intraorbital fat are unremarkable. Other: None. IMPRESSION: Interval development of a right acute subdural hematoma since yesterday's CT scan. Hematoma measures approximately 3-4 mm in thickness, overlies the right temporoparietal region and generates no substantial mass-effect nor midline shift. Atrophy with chronic small vessel white matter ischemic disease. Electronically Signed   By: Kennith Center M.D.   On: 05/10/2020 19:17   CT Head Wo Contrast  Result Date: 05/09/2020 CLINICAL DATA:  Near-syncope. EXAM: CT HEAD WITHOUT CONTRAST TECHNIQUE: Contiguous axial images were obtained from the base of the skull through the vertex without intravenous contrast. COMPARISON:  MRI brain dated January 11, 2020. CT head dated January 10, 2020. FINDINGS: Brain: No evidence of acute infarction, hemorrhage, hydrocephalus, extra-axial collection or mass lesion/mass effect. Stable atrophy and chronic ventriculomegaly. Vascular: Calcified atherosclerosis at the skullbase. No hyperdense vessel. Skull: Normal. Negative for fracture or focal lesion. Sinuses/Orbits: No acute finding. Other: None. IMPRESSION: 1. No acute intracranial abnormality. 2. Stable atrophy and chronic ventriculomegaly. Electronically Signed   By: Obie Dredge M.D.   On: 05/09/2020 13:44   CT Cervical Spine Wo Contrast  Result Date: 05/10/2020 CLINICAL DATA:  Fall, neck pain EXAM: CT CERVICAL SPINE WITHOUT CONTRAST TECHNIQUE: Multidetector CT imaging of the cervical spine was performed without  intravenous contrast. Multiplanar CT image reconstructions were also generated. COMPARISON:  None. FINDINGS: Alignment: Normal Skull base and vertebrae: No acute fracture. No primary bone lesion or focal pathologic process. Soft tissues and spinal canal: No prevertebral fluid or swelling. No visible canal hematoma. Disc levels: Diffuse degenerative disc disease with disc space narrowing and spurring from C4-5 through C7-T1. Diffuse bilateral degenerative facet disease. Upper chest: No acute findings Other: Carotid artery calcifications. IMPRESSION: Diffuse degenerative changes.  No acute bony abnormality. Electronically Signed   By: Charlett Nose M.D.   On: 05/10/2020 21:21   DG Chest Port 1 View  Result Date: 05/09/2020 CLINICAL DATA:  Syncope, altered mental status, hypotension EXAM: PORTABLE CHEST 1 VIEW COMPARISON:  02/16/2020 chest radiograph. FINDINGS: Stable cardiomediastinal silhouette with normal heart size. No pneumothorax. No pleural effusion. No pulmonary edema. Slightly low lung volumes. Mild streaky/tram track type opacities in the medial lung bases bilaterally. IMPRESSION: Mild streaky/tram track type opacities in the medial lung bases bilaterally, which could indicate mild bronchiectasis. No acute cardiopulmonary disease. Electronically Signed   By: Delbert Phenix  M.D.   On: 05/09/2020 12:14   ECHOCARDIOGRAM COMPLETE  Result Date: 05/09/2020    ECHOCARDIOGRAM REPORT   Patient Name:   Dean Harris Date of Exam: 05/09/2020 Medical Rec #:  [id number]      Height:       62.0 in Accession #:    [id number]     Weight:       140.4 lb Date of Birth:  February 16, 1941     BSA:          1.645 m Patient Age:    78 years       BP:           146/69 mmHg Patient Gender: M              HR:           78 bpm. Exam Location:  Inpatient Procedure: 2D Echo Indications:    Syncope R55  History:        Patient has prior history of Echocardiogram examinations, most                 recent 01/20/2019. CAD; Risk  Factors:Hypertension and Diabetes.  Sonographer:    Thurman Coyer RDCS (AE) Referring Phys: [id number] Ollen Bowl  Sonographer Comments: Restricted Mobility. IMPRESSIONS  1. Left ventricular ejection fraction, by estimation, is 60 to 65%. The left ventricle has normal function. The left ventricle has no regional wall motion abnormalities. Left ventricular diastolic parameters are consistent with Grade I diastolic dysfunction (impaired relaxation).  2. Right ventricular systolic function is normal. The right ventricular size is normal. Tricuspid regurgitation signal is inadequate for assessing PA pressure.  3. The mitral valve is grossly normal. No evidence of mitral valve regurgitation. No evidence of mitral stenosis.  4. The aortic valve is tricuspid. Aortic valve regurgitation is not visualized. Moderate aortic valve stenosis. Aortic valve area, by VTI measures 1.02 cm. Aortic valve mean gradient measures 32.0 mmHg. Aortic valve Vmax measures 3.50 m/s.  5. The inferior vena cava is normal in size with greater than 50% respiratory variability, suggesting right atrial pressure of 3 mmHg. Comparison(s): No significant change from prior study. Aortic stenosis remains moderate. FINDINGS  Left Ventricle: Left ventricular ejection fraction, by estimation, is 60 to 65%. The left ventricle has normal function. The left ventricle has no regional wall motion abnormalities. The left ventricular internal cavity size was normal in size. There is  no left ventricular hypertrophy. Left ventricular diastolic parameters are consistent with Grade I diastolic dysfunction (impaired relaxation). Right Ventricle: The right ventricular size is normal. No increase in right ventricular wall thickness. Right ventricular systolic function is normal. Tricuspid regurgitation signal is inadequate for assessing PA pressure. Left Atrium: Left atrial size was normal in size. Right Atrium: Right atrial size was normal in size. Pericardium:  There is no evidence of pericardial effusion. Mitral Valve: The mitral valve is grossly normal. No evidence of mitral valve regurgitation. No evidence of mitral valve stenosis. Tricuspid Valve: The tricuspid valve is grossly normal. Tricuspid valve regurgitation is not demonstrated. No evidence of tricuspid stenosis. Aortic Valve: The aortic valve is tricuspid. . There is severe thickening and severe calcifcation of the aortic valve. Aortic valve regurgitation is not visualized. Moderate aortic stenosis is present. There is severe thickening of the aortic valve. There is severe calcifcation of the aortic valve. Aortic valve mean gradient measures 32.0 mmHg. Aortic valve peak gradient measures 49.0 mmHg. Aortic valve area, by VTI measures 1.02 cm. Pulmonic Valve:  The pulmonic valve was grossly normal. Pulmonic valve regurgitation is not visualized. No evidence of pulmonic stenosis. Aorta: The aortic root and ascending aorta are structurally normal, with no evidence of dilitation. Venous: The inferior vena cava is normal in size with greater than 50% respiratory variability, suggesting right atrial pressure of 3 mmHg. IAS/Shunts: The interatrial septum appears to be lipomatous. The atrial septum is grossly normal.  LEFT VENTRICLE PLAX 2D LVIDd:         3.75 cm  Diastology LVIDs:         2.70 cm  LV e' lateral:   8.59 cm/s LV PW:         1.00 cm  LV E/e' lateral: 11.4 LV IVS:        1.00 cm  LV e' medial:    7.72 cm/s LVOT diam:     2.20 cm  LV E/e' medial:  12.7 LV SV:         94 LV SV Index:   57 LVOT Area:     3.80 cm  RIGHT VENTRICLE RV S prime:     14.40 cm/s TAPSE (M-mode): 2.1 cm LEFT ATRIUM             Index       RIGHT ATRIUM           Index LA diam:        3.45 cm 2.10 cm/m  RA Area:     16.50 cm LA Vol (A2C):   77.0 ml 46.81 ml/m RA Volume:   43.10 ml  26.20 ml/m LA Vol (A4C):   48.4 ml 29.43 ml/m LA Biplane Vol: 63.6 ml 38.67 ml/m  AORTIC VALVE AV Area (Vmax):    0.99 cm AV Area (Vmean):   0.98 cm  AV Area (VTI):     1.02 cm AV Vmax:           350.00 cm/s AV Vmean:          263.500 cm/s AV VTI:            0.920 m AV Peak Grad:      49.0 mmHg AV Mean Grad:      32.0 mmHg LVOT Vmax:         90.90 cm/s LVOT Vmean:        67.600 cm/s LVOT VTI:          0.247 m LVOT/AV VTI ratio: 0.27  AORTA Ao Root diam: 3.25 cm MITRAL VALVE MV Area (PHT): 2.83 cm    SHUNTS MV Decel Time: 268 msec    Systemic VTI:  0.25 m MV E velocity: 97.90 cm/s  Systemic Diam: 2.20 cm MV A velocity: 90.00 cm/s MV E/A ratio:  1.09 Lennie OdorWesley O'Neal MD Electronically signed by Lennie OdorWesley O'Neal MD Signature Date/Time: 05/09/2020/5:16:06 PM    Final    VAS US CAROTID  Result Date: 05/10/2020 Carotid Arterial Duplex Study Indications:  Syncope and Weakness. Risk Factors: Hyperlipidemia, Diabetes, coronary artery disease. Performing Technologist: Jannet AskewVernon Matacale RCT RDMS  Examination Guidelines: A complete evaluation includes B-mode imaging, spectral Doppler, color Doppler, and power Doppler as needed of all accessible portions of each vessel. Bilateral testing is considered an integral part of a complete examination. Limited examinations for reoccurring indications may be performed as noted.  Right Carotid Findings: +----------+--------+--------+--------+------------------+------------------+           PSV cm/sEDV cm/sStenosisPlaque DescriptionComments           +----------+--------+--------+--------+------------------+------------------+ CCA Prox  106  15                                                   +----------+--------+--------+--------+------------------+------------------+ CCA Distal90      11              focal and smooth  intimal thickening +----------+--------+--------+--------+------------------+------------------+ ICA Prox  98      21      1-39%                     intimal thickening +----------+--------+--------+--------+------------------+------------------+ ICA Distal96      17                                                    +----------+--------+--------+--------+------------------+------------------+ ECA       97      1                                                    +----------+--------+--------+--------+------------------+------------------+ +----------+--------+-------+--------+-------------------+           PSV cm/sEDV cmsDescribeArm Pressure (mmHG) +----------+--------+-------+--------+-------------------+ Subclavian182                                        +----------+--------+-------+--------+-------------------+ +---------+--------+--------+---------+ VertebralPSV cm/sEDV cm/sAntegrade +---------+--------+--------+---------+  Left Carotid Findings: +----------+--------+--------+--------+------------------+------------------+           PSV cm/sEDV cm/sStenosisPlaque DescriptionComments           +----------+--------+--------+--------+------------------+------------------+ CCA Prox  93      29                                                   +----------+--------+--------+--------+------------------+------------------+ CCA Distal102     18                                intimal thickening +----------+--------+--------+--------+------------------+------------------+ ICA Prox  288     26      40-59%  focal and smooth  intimal thickening +----------+--------+--------+--------+------------------+------------------+ ICA Distal134     20                                                   +----------+--------+--------+--------+------------------+------------------+ ECA       170     3                                                    +----------+--------+--------+--------+------------------+------------------+ +----------+--------+--------+--------+-------------------+           PSV cm/sEDV cm/sDescribeArm  Pressure (mmHG) +----------+--------+--------+--------+-------------------+ Subclavian122     16                                   +----------+--------+--------+--------+-------------------+ +---------+--------+--+--------+--+---------+ VertebralPSV cm/s99EDV cm/s23Antegrade +---------+--------+--+--------+--+---------+   Summary: Right Carotid: Velocities in the right ICA are consistent with a 1-39% stenosis. Left Carotid: Velocities in the left ICA are consistent with a 40-59% stenosis. Vertebrals: Bilateral vertebral arteries demonstrate antegrade flow. *See table(s) above for measurements and observations.  Electronically signed by Sherald Hess MD on 05/10/2020 at 3:16:58 PM.    Final    Subjective: Alert, denies headaches.  He was trying to eat breakfast when I saw him today earlier.    Discharge Exam: Vitals:   05/11/20 0810 05/11/20 1127  BP: (!) 142/66 (!) 145/80  Pulse: 60 67  Resp: 18 17  Temp: 97.8 F (36.6 C) 97.6 F (36.4 C)  SpO2: 98% 99%     General: Pt is alert, awake, not in acute distress Cardiovascular: RRR, S1/S2 +, no rubs, no gallops Respiratory: CTA bilaterally, no wheezing, no rhonchi Abdominal: Soft, NT, ND, bowel sounds + Extremities: no edema, no cyanosis Neuro; alert, say few words, at baseline not very conversant. Moves both upper extremities.     The results of significant diagnostics from this hospitalization (including imaging, microbiology, ancillary and laboratory) are listed below for reference.     Microbiology: Recent Results (from the past 240 hour(s))  Culture, blood (single)     Status: None (Preliminary result)   Collection Time: 05/09/20 12:04 PM   Specimen: Site Not Specified; Blood  Result Value Ref Range Status   Specimen Description SITE NOT SPECIFIED  Final   Special Requests   Final    BOTTLES DRAWN AEROBIC AND ANAEROBIC Blood Culture results may not be optimal due to an inadequate volume of blood received in culture bottles   Culture   Final    NO GROWTH 2 DAYS Performed at [facility], [address], [place], [place]  [address]    Report Status PENDING  Incomplete  SARS Coronavirus 2 by RT PCR (hospital order, performed in [facility] hospital lab) Nasopharyngeal Nasopharyngeal Swab     Status: None   Collection Time: 05/09/20 12:11 PM   Specimen: Nasopharyngeal Swab  Result Value Ref Range Status   SARS Coronavirus 2 NEGATIVE NEGATIVE Final    Comment: (NOTE) SARS-CoV-2 target nucleic acids are NOT DETECTED.  The SARS-CoV-2 RNA is generally detectable in upper and lower respiratory specimens during the acute phase of infection. The lowest concentration of SARS-CoV-2 viral copies this assay can detect is 250 copies / mL. A negative result does not preclude SARS-CoV-2 infection and should not be used as the sole basis for treatment or other patient management decisions.  A negative result may occur with improper specimen collection / handling, submission of specimen other than nasopharyngeal swab, presence of viral mutation(s) within the areas targeted by this assay, and inadequate number of viral copies (<250 copies / mL). A negative result must be combined with clinical observations, patient history, and epidemiological information.  Fact Sheet for Patients:   BoilerBrush.com.cy  Fact Sheet for Healthcare Providers: https://pope.com/  This test is not yet approved or  cleared by the [place] FDA and has been authorized for detection and/or diagnosis of SARS-CoV-2 by FDA under an Emergency Use Authorization (EUA).  This EUA will remain in effect (meaning this test can  be used) for the duration of the COVID-19 declaration under Section 564(b)(1) of the Act, 21 U.S.C. section 360bbb-3(b)(1), unless the authorization is terminated or revoked sooner.  Performed at [facility], [address] [place], [place] [address]   Urine culture     Status: Abnormal   Collection Time: 05/09/20 12:23 PM   Specimen: Urine, Random  Result Value Ref  Range Status   Specimen Description URINE, RANDOM  Final   Special Requests NONE  Final   Culture (A)  Final    <10,000 COLONIES/mL INSIGNIFICANT GROWTH Performed at [facility], [address] [place], [place] [address]    Report Status 05/10/2020 FINAL  Final  MRSA PCR Screening     Status: None   Collection Time: 05/10/20  3:30 AM   Specimen: Nasal Mucosa; Nasopharyngeal  Result Value Ref Range Status   MRSA by PCR NEGATIVE NEGATIVE Final    Comment:        The GeneXpert MRSA Assay (FDA approved for NASAL specimens only), is one component of a comprehensive MRSA colonization surveillance program. It is not intended to diagnose MRSA infection nor to guide or monitor treatment for MRSA infections. Performed at [facility], [address] [place], [place] [address]      Labs: BNP (last 3 results) No results for input(s): BNP in the last 8760 hours. Basic Metabolic Panel: Recent Labs  Lab 05/09/20 1147 05/09/20 1708 05/10/20 0437 05/10/20 2051  NA 138  --  139 138  K 4.2  --  4.2 4.1  CL 105  --  107 106  CO2 24  --  20* 22  GLUCOSE 256*  --  155* 142*  BUN 26*  --  24* 21  CREATININE 1.52* 1.27* 1.10 1.12  CALCIUM 8.5*  --  8.3* 8.6*  MG  --  1.9  --   --   PHOS  --  3.9  --   --    Liver Function Tests: Recent Labs  Lab 05/09/20 1147 05/10/20 0437  AST 16 18  ALT 19 15  ALKPHOS 63 56  BILITOT 1.4* 1.3*  PROT 6.0* 5.3*  ALBUMIN 3.2* 2.8*   No results for input(s): LIPASE, AMYLASE in the last 168 hours. Recent Labs  Lab 05/09/20 1722  AMMONIA 11   CBC: Recent Labs  Lab 05/09/20 1147 05/09/20 1708 05/10/20 0437 05/10/20 2051  WBC 7.9 6.2 6.2 6.6  NEUTROABS 6.5  --   --  4.5  HGB 11.7* 11.2* 11.4* 12.1*  HCT 36.6* 34.0* 34.6* 36.4*  MCV 91.3 89.9 90.3 88.8  PLT 114* 117* 92* 143*   Cardiac Enzymes: No results for input(s): CKTOTAL, CKMB, CKMBINDEX, TROPONINI in the last 168 hours. BNP: Invalid input(s):  POCBNP CBG: Recent Labs  Lab 05/10/20 0619 05/10/20 2012  GLUCAP 153* 137*   D-Dimer No results for input(s): DDIMER in the last 72 hours. Hgb A1c No results for input(s): HGBA1C in the last 72 hours. Lipid Profile No results for input(s): CHOL, HDL, LDLCALC, TRIG, CHOLHDL, LDLDIRECT in the last 72 hours. Thyroid function studies Recent Labs    05/09/20 1708  TSH 2.106   Anemia work up Recent Labs    05/09/20 1708  VITAMINB12 389  FOLATE 27.2   Urinalysis    Component Value Date/Time   COLORURINE YELLOW 05/09/2020 1223   APPEARANCEUR CLEAR 05/09/2020 1223   LABSPEC 1.012 05/09/2020 1223   PHURINE 5.0 05/09/2020 1223   GLUCOSEU NEGATIVE 05/09/2020  1223   HGBUR MODERATE (A) 05/09/2020 1223   BILIRUBINUR NEGATIVE 05/09/2020 1223   KETONESUR NEGATIVE 05/09/2020 1223   PROTEINUR NEGATIVE 05/09/2020 1223   NITRITE NEGATIVE 05/09/2020 1223   LEUKOCYTESUR TRACE (A) 05/09/2020 1223   Sepsis Labs Invalid input(s): PROCALCITONIN,  WBC,  LACTICIDVEN Microbiology Recent Results (from the past 240 hour(s))  Culture, blood (single)     Status: None (Preliminary result)   Collection Time: 05/09/20 12:04 PM   Specimen: Site Not Specified; Blood  Result Value Ref Range Status   Specimen Description SITE NOT SPECIFIED  Final   Special Requests   Final    BOTTLES DRAWN AEROBIC AND ANAEROBIC Blood Culture results may not be optimal due to an inadequate volume of blood received in culture bottles   Culture   Final    NO GROWTH 2 DAYS Performed at [facility], [address] [place], [place] [address]    Report Status PENDING  Incomplete  SARS Coronavirus 2 by RT PCR (hospital order, performed in [facility] hospital lab) Nasopharyngeal Nasopharyngeal Swab     Status: None   Collection Time: 05/09/20 12:11 PM   Specimen: Nasopharyngeal Swab  Result Value Ref Range Status   SARS Coronavirus 2 NEGATIVE NEGATIVE Final    Comment: (NOTE) SARS-CoV-2 target nucleic acids  are NOT DETECTED.  The SARS-CoV-2 RNA is generally detectable in upper and lower respiratory specimens during the acute phase of infection. The lowest concentration of SARS-CoV-2 viral copies this assay can detect is 250 copies / mL. A negative result does not preclude SARS-CoV-2 infection and should not be used as the sole basis for treatment or other patient management decisions.  A negative result may occur with improper specimen collection / handling, submission of specimen other than nasopharyngeal swab, presence of viral mutation(s) within the areas targeted by this assay, and inadequate number of viral copies (<250 copies / mL). A negative result must be combined with clinical observations, patient history, and epidemiological information.  Fact Sheet for Patients:   BoilerBrush.com.cy  Fact Sheet for Healthcare Providers: https://pope.com/  This test is not yet approved or  cleared by the [place] FDA and has been authorized for detection and/or diagnosis of SARS-CoV-2 by FDA under an Emergency Use Authorization (EUA).  This EUA will remain in effect (meaning this test can be used) for the duration of the COVID-19 declaration under Section 564(b)(1) of the Act, 21 U.S.C. section 360bbb-3(b)(1), unless the authorization is terminated or revoked sooner.  Performed at [facility], [address] [place], [place] [address]   Urine culture     Status: Abnormal   Collection Time: 05/09/20 12:23 PM   Specimen: Urine, Random  Result Value Ref Range Status   Specimen Description URINE, RANDOM  Final   Special Requests NONE  Final   Culture (A)  Final    <10,000 COLONIES/mL INSIGNIFICANT GROWTH Performed at [facility], [address] [place], [place] [address]    Report Status 05/10/2020 FINAL  Final  MRSA PCR Screening     Status: None   Collection Time: 05/10/20  3:30 AM   Specimen: Nasal Mucosa;  Nasopharyngeal  Result Value Ref Range Status   MRSA by PCR NEGATIVE NEGATIVE Final    Comment:        The GeneXpert MRSA Assay (FDA approved for NASAL specimens only), is one component of a comprehensive MRSA colonization surveillance program. It is not intended to diagnose MRSA infection nor to  guide or monitor treatment for MRSA infections. Performed at [facility], [address] [place], [place] [address]      Time coordinating discharge: 40 minutes  SIGNED:   Alba Cory, MD  [facility]

## 2020-05-11 NOTE — Discharge Instructions (Signed)
Head Injury, Adult There are many types of head injuries. They can be as minor as a bump. Some head injuries can be worse. Worse injuries include:  A strong hit to the head that shakes the brain back and forth causing damage (concussion).  A bruise (contusion) of the brain. This means there is bleeding in the brain that can cause swelling.  A cracked skull (skull fracture).  Bleeding in the brain that gathers, gets thick (makes a clot), and forms a bump (hematoma). Most problems from a head injury come in the first 24 hours. However, you may still have side effects up to 7-10 days after your injury. It is important to watch your condition for any changes. You may need to be watched in the emergency department or urgent care, or you may need to stay in the hospital. What are the causes? There are many possible causes of a head injury. A serious head injury may be caused by:  A car accident.  Bicycle or motorcycle accidents.  Sports injuries.  Falls. What are the signs or symptoms? Symptoms of a head injury include a bruise, bump, or bleeding where the injury happened. Other physical symptoms may include:  Headache.  Feeling sick to your stomach (nauseous) or vomiting.  Dizziness.  Feeling tired.  Being uncomfortable around bright lights or loud noises.  Shaking movements that you cannot control (seizures).  Trouble being woken up.  Passing out (fainting). Mental or emotional symptoms may include:  Feeling grumpy or cranky.  Confusion and memory problems.  Having trouble paying attention or concentrating.  Changes in eating or sleeping habits.  Feeling worried or nervous (anxious).  Feeling sad (depressed). How is this treated? Treatment for this condition depends on how severe the injury is and the type of injury you have. The main goal is to prevent complications and to allow the brain time to heal. Mild head injury If you have a mild head injury, you may be  sent home and treatment may include:  Being watched. A responsible adult should stay with you for 24 hours after your injury and check on you often.  Physical rest.  Brain rest.  Pain medicines. Severe head injury If you have a severe head injury, treatment may include:  Being watched closely. This includes hospitalization with frequent physical exams.  Medicines to: ? Help with pain. ? Prevent shaking movements that you cannot control. ? Help with brain swelling.  Using a machine that helps you breathe (ventilator).  Treatments to manage the swelling inside the brain.  Brain surgery. This may be needed to: ? Remove a blood clot. ? Stop the bleeding. ? Remove a part of the skull. This allows room for the brain to swell. Follow these instructions at home: Activity  Rest.  Avoid activities that are hard or tiring.  Make sure you get enough sleep.  Limit activities that need a lot of thought or attention, such as: ? Watching TV. ? Playing memory games and puzzles. ? Job-related work or homework. ? Working on the computer, social media, and texting.  Avoid activities that could cause another head injury until your doctor says it is okay. This includes playing sports. Having another head injury, especially before the first one has healed, can be dangerous.  Ask your doctor when it is safe for you to go back to your normal activities, such as work or school. Ask your doctor for a step-by-step plan for slowly going back to your normal activities.  Ask   your doctor when you can drive, ride a bicycle, or use heavy machinery. Do not do these activities if you are dizzy. Lifestyle   Do not drink alcohol until your doctor says it is okay.  Do not use drugs.  If it is harder than usual to remember things, write them down.  If you are easily distracted, try to do one thing at a time.  Talk with family members or close friends when making important decisions.  Tell your  friends, family, a trusted coworker, and work manager about your injury, symptoms, and limits (restrictions). Have them watch for any problems that are new or getting worse. General instructions  Take over-the-counter and prescription medicines only as told by your doctor.  Have someone stay with you for 24 hours after your head injury. This person should watch you for any changes in your symptoms and be ready to get help.  Keep all follow-up visits as told by your doctor. This is important. How is this prevented?  Work on your balance and strength. This can help you avoid falls.  Wear a seatbelt when you are in a moving vehicle.  Wear a helmet when you: ? Ride a bicycle. ? Ski. ? Do any other sport or activity that has a risk of injury.  If you drink alcohol: ? Limit how much you use to:  0-1 drink a day for women.  0-2 drinks a day for men. ? Be aware of how much alcohol is in your drink. In the [place] one drink equals one 12 oz bottle of beer (355 mL), one 5 oz glass of wine (148 mL), or one 1 oz glass of hard liquor (44 mL).  Make your home safer by: ? Getting rid of clutter from the floors and stairs. This includes things that can make you trip. ? Using grab bars in bathrooms and handrails by stairs. ? Placing non-slip mats on floors and in bathtubs. ? Putting more light in dim areas. Get help right away if:  You have: ? A very bad headache that is not helped by medicine. ? Trouble walking or weakness in your arms and legs. ? Clear or bloody fluid coming from your nose or ears. ? Changes in how you see (vision). ? Shaking movements that you cannot control.  You lose your balance.  You vomit.  The black centers of your eyes (pupils) change in size.  Your speech is slurred.  Your dizziness gets worse.  You pass out.  You are sleepier than normal and have trouble staying awake.  Your symptoms get worse. These symptoms may be an emergency. Do not wait to see  if the symptoms will go away. Get medical help right away. Call your local emergency services (911 in the [place] Do not drive yourself to the hospital. Summary  There are many types of head injuries. They can be as minor as a bump. Some head injuries can be worse  Treatment for this condition depends on how severe the injury is and the type of injury you have.  Ask your doctor when it is safe for you to go back to your normal activities, such as work or school.  To prevent a head injury, wear a seat belt in a car, wear a helmet when you use a a bicycle, limit your alcohol use, and make your home safer. This information is not intended to replace advice given to you by your health care provider. Make sure you discuss any questions you   have with your health care provider. Document Revised: 12/23/2018 Document Reviewed: 09/24/2018 Elsevier Patient Education  2020 [employer]  

## 2020-05-14 DIAGNOSIS — F0150 Vascular dementia without behavioral disturbance: Secondary | ICD-10-CM | POA: Diagnosis not present

## 2020-05-14 DIAGNOSIS — R296 Repeated falls: Secondary | ICD-10-CM | POA: Diagnosis not present

## 2020-05-14 DIAGNOSIS — R4182 Altered mental status, unspecified: Secondary | ICD-10-CM | POA: Diagnosis not present

## 2020-05-14 DIAGNOSIS — S0101XD Laceration without foreign body of scalp, subsequent encounter: Secondary | ICD-10-CM | POA: Diagnosis not present

## 2020-05-14 DIAGNOSIS — J449 Chronic obstructive pulmonary disease, unspecified: Secondary | ICD-10-CM | POA: Diagnosis not present

## 2020-05-14 DIAGNOSIS — G629 Polyneuropathy, unspecified: Secondary | ICD-10-CM | POA: Diagnosis not present

## 2020-05-14 DIAGNOSIS — R2681 Unsteadiness on feet: Secondary | ICD-10-CM | POA: Diagnosis not present

## 2020-05-14 DIAGNOSIS — G4730 Sleep apnea, unspecified: Secondary | ICD-10-CM | POA: Diagnosis not present

## 2020-05-14 DIAGNOSIS — I2510 Atherosclerotic heart disease of native coronary artery without angina pectoris: Secondary | ICD-10-CM | POA: Diagnosis not present

## 2020-05-14 DIAGNOSIS — I10 Essential (primary) hypertension: Secondary | ICD-10-CM | POA: Diagnosis not present

## 2020-05-14 DIAGNOSIS — R262 Difficulty in walking, not elsewhere classified: Secondary | ICD-10-CM | POA: Diagnosis not present

## 2020-05-14 DIAGNOSIS — J189 Pneumonia, unspecified organism: Secondary | ICD-10-CM | POA: Diagnosis not present

## 2020-05-14 DIAGNOSIS — E119 Type 2 diabetes mellitus without complications: Secondary | ICD-10-CM | POA: Diagnosis not present

## 2020-05-14 LAB — CULTURE, BLOOD (SINGLE): Culture: NO GROWTH

## 2020-05-18 ENCOUNTER — Telehealth: Payer: Self-pay | Admitting: Nurse Practitioner

## 2020-05-18 NOTE — Telephone Encounter (Signed)
Renee calling to request a verbal order for home health OT. 1 time a week for 5 weeks.

## 2020-05-18 NOTE — Telephone Encounter (Signed)
Verbal order given for OT one time per week x 5 weeks.

## 2020-05-22 DIAGNOSIS — F0150 Vascular dementia without behavioral disturbance: Secondary | ICD-10-CM | POA: Diagnosis not present

## 2020-05-22 DIAGNOSIS — S0101XD Laceration without foreign body of scalp, subsequent encounter: Secondary | ICD-10-CM | POA: Diagnosis not present

## 2020-05-22 DIAGNOSIS — R296 Repeated falls: Secondary | ICD-10-CM | POA: Diagnosis not present

## 2020-05-23 DIAGNOSIS — F419 Anxiety disorder, unspecified: Secondary | ICD-10-CM | POA: Diagnosis not present

## 2020-05-23 DIAGNOSIS — I10 Essential (primary) hypertension: Secondary | ICD-10-CM | POA: Diagnosis not present

## 2020-05-23 DIAGNOSIS — J449 Chronic obstructive pulmonary disease, unspecified: Secondary | ICD-10-CM | POA: Diagnosis not present

## 2020-05-23 DIAGNOSIS — E1142 Type 2 diabetes mellitus with diabetic polyneuropathy: Secondary | ICD-10-CM | POA: Diagnosis not present

## 2020-05-23 DIAGNOSIS — F0390 Unspecified dementia without behavioral disturbance: Secondary | ICD-10-CM | POA: Diagnosis not present

## 2020-05-23 DIAGNOSIS — E119 Type 2 diabetes mellitus without complications: Secondary | ICD-10-CM | POA: Diagnosis not present

## 2020-05-23 DIAGNOSIS — I2510 Atherosclerotic heart disease of native coronary artery without angina pectoris: Secondary | ICD-10-CM | POA: Diagnosis not present

## 2020-05-23 DIAGNOSIS — Z9181 History of falling: Secondary | ICD-10-CM | POA: Diagnosis not present

## 2020-05-23 DIAGNOSIS — G4730 Sleep apnea, unspecified: Secondary | ICD-10-CM | POA: Diagnosis not present

## 2020-05-29 DIAGNOSIS — J449 Chronic obstructive pulmonary disease, unspecified: Secondary | ICD-10-CM | POA: Diagnosis not present

## 2020-05-29 DIAGNOSIS — G4730 Sleep apnea, unspecified: Secondary | ICD-10-CM | POA: Diagnosis not present

## 2020-05-29 DIAGNOSIS — Z9181 History of falling: Secondary | ICD-10-CM | POA: Diagnosis not present

## 2020-05-29 DIAGNOSIS — I10 Essential (primary) hypertension: Secondary | ICD-10-CM | POA: Diagnosis not present

## 2020-05-29 DIAGNOSIS — E1142 Type 2 diabetes mellitus with diabetic polyneuropathy: Secondary | ICD-10-CM | POA: Diagnosis not present

## 2020-05-29 DIAGNOSIS — F419 Anxiety disorder, unspecified: Secondary | ICD-10-CM | POA: Diagnosis not present

## 2020-05-29 DIAGNOSIS — F0390 Unspecified dementia without behavioral disturbance: Secondary | ICD-10-CM | POA: Diagnosis not present

## 2020-05-29 DIAGNOSIS — I2510 Atherosclerotic heart disease of native coronary artery without angina pectoris: Secondary | ICD-10-CM | POA: Diagnosis not present

## 2020-05-30 DIAGNOSIS — Z9181 History of falling: Secondary | ICD-10-CM | POA: Diagnosis not present

## 2020-05-30 DIAGNOSIS — E1142 Type 2 diabetes mellitus with diabetic polyneuropathy: Secondary | ICD-10-CM | POA: Diagnosis not present

## 2020-05-30 DIAGNOSIS — F419 Anxiety disorder, unspecified: Secondary | ICD-10-CM | POA: Diagnosis not present

## 2020-05-30 DIAGNOSIS — F0390 Unspecified dementia without behavioral disturbance: Secondary | ICD-10-CM | POA: Diagnosis not present

## 2020-05-30 DIAGNOSIS — I2510 Atherosclerotic heart disease of native coronary artery without angina pectoris: Secondary | ICD-10-CM | POA: Diagnosis not present

## 2020-05-30 DIAGNOSIS — I10 Essential (primary) hypertension: Secondary | ICD-10-CM | POA: Diagnosis not present

## 2020-05-30 DIAGNOSIS — G4730 Sleep apnea, unspecified: Secondary | ICD-10-CM | POA: Diagnosis not present

## 2020-05-30 DIAGNOSIS — J449 Chronic obstructive pulmonary disease, unspecified: Secondary | ICD-10-CM | POA: Diagnosis not present

## 2020-06-06 DIAGNOSIS — I2510 Atherosclerotic heart disease of native coronary artery without angina pectoris: Secondary | ICD-10-CM | POA: Diagnosis not present

## 2020-06-06 DIAGNOSIS — E1142 Type 2 diabetes mellitus with diabetic polyneuropathy: Secondary | ICD-10-CM | POA: Diagnosis not present

## 2020-06-06 DIAGNOSIS — J449 Chronic obstructive pulmonary disease, unspecified: Secondary | ICD-10-CM | POA: Diagnosis not present

## 2020-06-06 DIAGNOSIS — F419 Anxiety disorder, unspecified: Secondary | ICD-10-CM | POA: Diagnosis not present

## 2020-06-06 DIAGNOSIS — G4730 Sleep apnea, unspecified: Secondary | ICD-10-CM | POA: Diagnosis not present

## 2020-06-06 DIAGNOSIS — Z9181 History of falling: Secondary | ICD-10-CM | POA: Diagnosis not present

## 2020-06-06 DIAGNOSIS — I10 Essential (primary) hypertension: Secondary | ICD-10-CM | POA: Diagnosis not present

## 2020-06-06 DIAGNOSIS — F0390 Unspecified dementia without behavioral disturbance: Secondary | ICD-10-CM | POA: Diagnosis not present

## 2020-06-07 ENCOUNTER — Ambulatory Visit (HOSPITAL_COMMUNITY): Admit: 2020-06-07 | Payer: Medicare PPO | Attending: Cardiology | Admitting: Cardiology

## 2020-06-11 DIAGNOSIS — G4730 Sleep apnea, unspecified: Secondary | ICD-10-CM | POA: Diagnosis not present

## 2020-06-11 DIAGNOSIS — I2510 Atherosclerotic heart disease of native coronary artery without angina pectoris: Secondary | ICD-10-CM | POA: Diagnosis not present

## 2020-06-11 DIAGNOSIS — F419 Anxiety disorder, unspecified: Secondary | ICD-10-CM | POA: Diagnosis not present

## 2020-06-11 DIAGNOSIS — J449 Chronic obstructive pulmonary disease, unspecified: Secondary | ICD-10-CM | POA: Diagnosis not present

## 2020-06-11 DIAGNOSIS — I10 Essential (primary) hypertension: Secondary | ICD-10-CM | POA: Diagnosis not present

## 2020-06-11 DIAGNOSIS — F0390 Unspecified dementia without behavioral disturbance: Secondary | ICD-10-CM | POA: Diagnosis not present

## 2020-06-11 DIAGNOSIS — Z9181 History of falling: Secondary | ICD-10-CM | POA: Diagnosis not present

## 2020-06-11 DIAGNOSIS — E1142 Type 2 diabetes mellitus with diabetic polyneuropathy: Secondary | ICD-10-CM | POA: Diagnosis not present

## 2020-06-14 DIAGNOSIS — R2681 Unsteadiness on feet: Secondary | ICD-10-CM | POA: Diagnosis not present

## 2020-06-14 DIAGNOSIS — J189 Pneumonia, unspecified organism: Secondary | ICD-10-CM | POA: Diagnosis not present

## 2020-06-14 DIAGNOSIS — R4182 Altered mental status, unspecified: Secondary | ICD-10-CM | POA: Diagnosis not present

## 2020-06-14 DIAGNOSIS — R262 Difficulty in walking, not elsewhere classified: Secondary | ICD-10-CM | POA: Diagnosis not present

## 2020-06-19 ENCOUNTER — Ambulatory Visit: Payer: Medicare PPO | Admitting: Podiatry

## 2020-06-19 DIAGNOSIS — Z20828 Contact with and (suspected) exposure to other viral communicable diseases: Secondary | ICD-10-CM | POA: Diagnosis not present

## 2020-06-20 DIAGNOSIS — F0390 Unspecified dementia without behavioral disturbance: Secondary | ICD-10-CM | POA: Diagnosis not present

## 2020-06-20 DIAGNOSIS — E1142 Type 2 diabetes mellitus with diabetic polyneuropathy: Secondary | ICD-10-CM | POA: Diagnosis not present

## 2020-06-20 DIAGNOSIS — J449 Chronic obstructive pulmonary disease, unspecified: Secondary | ICD-10-CM | POA: Diagnosis not present

## 2020-06-20 DIAGNOSIS — I10 Essential (primary) hypertension: Secondary | ICD-10-CM | POA: Diagnosis not present

## 2020-06-20 DIAGNOSIS — G4730 Sleep apnea, unspecified: Secondary | ICD-10-CM | POA: Diagnosis not present

## 2020-06-20 DIAGNOSIS — F419 Anxiety disorder, unspecified: Secondary | ICD-10-CM | POA: Diagnosis not present

## 2020-06-20 DIAGNOSIS — Z9181 History of falling: Secondary | ICD-10-CM | POA: Diagnosis not present

## 2020-06-20 DIAGNOSIS — I2510 Atherosclerotic heart disease of native coronary artery without angina pectoris: Secondary | ICD-10-CM | POA: Diagnosis not present

## 2020-06-22 DIAGNOSIS — F419 Anxiety disorder, unspecified: Secondary | ICD-10-CM | POA: Diagnosis not present

## 2020-06-22 DIAGNOSIS — S065X0D Traumatic subdural hemorrhage without loss of consciousness, subsequent encounter: Secondary | ICD-10-CM | POA: Diagnosis not present

## 2020-06-22 DIAGNOSIS — I10 Essential (primary) hypertension: Secondary | ICD-10-CM | POA: Diagnosis not present

## 2020-06-22 DIAGNOSIS — I2510 Atherosclerotic heart disease of native coronary artery without angina pectoris: Secondary | ICD-10-CM | POA: Diagnosis not present

## 2020-06-22 DIAGNOSIS — J449 Chronic obstructive pulmonary disease, unspecified: Secondary | ICD-10-CM | POA: Diagnosis not present

## 2020-06-22 DIAGNOSIS — G4730 Sleep apnea, unspecified: Secondary | ICD-10-CM | POA: Diagnosis not present

## 2020-06-22 DIAGNOSIS — Z9181 History of falling: Secondary | ICD-10-CM | POA: Diagnosis not present

## 2020-06-22 DIAGNOSIS — E1142 Type 2 diabetes mellitus with diabetic polyneuropathy: Secondary | ICD-10-CM | POA: Diagnosis not present

## 2020-06-22 DIAGNOSIS — F0150 Vascular dementia without behavioral disturbance: Secondary | ICD-10-CM | POA: Diagnosis not present

## 2020-06-26 DIAGNOSIS — Z9181 History of falling: Secondary | ICD-10-CM | POA: Diagnosis not present

## 2020-06-26 DIAGNOSIS — F0150 Vascular dementia without behavioral disturbance: Secondary | ICD-10-CM | POA: Diagnosis not present

## 2020-06-26 DIAGNOSIS — E1142 Type 2 diabetes mellitus with diabetic polyneuropathy: Secondary | ICD-10-CM | POA: Diagnosis not present

## 2020-06-26 DIAGNOSIS — G4730 Sleep apnea, unspecified: Secondary | ICD-10-CM | POA: Diagnosis not present

## 2020-06-26 DIAGNOSIS — I2510 Atherosclerotic heart disease of native coronary artery without angina pectoris: Secondary | ICD-10-CM | POA: Diagnosis not present

## 2020-06-26 DIAGNOSIS — F419 Anxiety disorder, unspecified: Secondary | ICD-10-CM | POA: Diagnosis not present

## 2020-06-26 DIAGNOSIS — J449 Chronic obstructive pulmonary disease, unspecified: Secondary | ICD-10-CM | POA: Diagnosis not present

## 2020-06-26 DIAGNOSIS — I10 Essential (primary) hypertension: Secondary | ICD-10-CM | POA: Diagnosis not present

## 2020-06-26 DIAGNOSIS — S065X0D Traumatic subdural hemorrhage without loss of consciousness, subsequent encounter: Secondary | ICD-10-CM | POA: Diagnosis not present

## 2020-06-28 ENCOUNTER — Ambulatory Visit: Payer: Medicare PPO | Admitting: Cardiology

## 2020-06-28 DIAGNOSIS — Z20828 Contact with and (suspected) exposure to other viral communicable diseases: Secondary | ICD-10-CM | POA: Diagnosis not present

## 2020-07-02 DIAGNOSIS — G629 Polyneuropathy, unspecified: Secondary | ICD-10-CM | POA: Diagnosis not present

## 2020-07-02 DIAGNOSIS — L89319 Pressure ulcer of right buttock, unspecified stage: Secondary | ICD-10-CM | POA: Diagnosis not present

## 2020-07-02 DIAGNOSIS — L89329 Pressure ulcer of left buttock, unspecified stage: Secondary | ICD-10-CM | POA: Diagnosis not present

## 2020-07-02 DIAGNOSIS — I10 Essential (primary) hypertension: Secondary | ICD-10-CM | POA: Diagnosis not present

## 2020-07-02 DIAGNOSIS — F0150 Vascular dementia without behavioral disturbance: Secondary | ICD-10-CM | POA: Diagnosis not present

## 2020-07-03 DIAGNOSIS — S065X0D Traumatic subdural hemorrhage without loss of consciousness, subsequent encounter: Secondary | ICD-10-CM | POA: Diagnosis not present

## 2020-07-03 DIAGNOSIS — I10 Essential (primary) hypertension: Secondary | ICD-10-CM | POA: Diagnosis not present

## 2020-07-03 DIAGNOSIS — F419 Anxiety disorder, unspecified: Secondary | ICD-10-CM | POA: Diagnosis not present

## 2020-07-03 DIAGNOSIS — I2510 Atherosclerotic heart disease of native coronary artery without angina pectoris: Secondary | ICD-10-CM | POA: Diagnosis not present

## 2020-07-03 DIAGNOSIS — Z9181 History of falling: Secondary | ICD-10-CM | POA: Diagnosis not present

## 2020-07-03 DIAGNOSIS — J449 Chronic obstructive pulmonary disease, unspecified: Secondary | ICD-10-CM | POA: Diagnosis not present

## 2020-07-03 DIAGNOSIS — E1142 Type 2 diabetes mellitus with diabetic polyneuropathy: Secondary | ICD-10-CM | POA: Diagnosis not present

## 2020-07-03 DIAGNOSIS — F0150 Vascular dementia without behavioral disturbance: Secondary | ICD-10-CM | POA: Diagnosis not present

## 2020-07-03 DIAGNOSIS — G4730 Sleep apnea, unspecified: Secondary | ICD-10-CM | POA: Diagnosis not present

## 2020-07-04 DIAGNOSIS — I10 Essential (primary) hypertension: Secondary | ICD-10-CM | POA: Diagnosis not present

## 2020-07-04 DIAGNOSIS — J449 Chronic obstructive pulmonary disease, unspecified: Secondary | ICD-10-CM | POA: Diagnosis not present

## 2020-07-04 DIAGNOSIS — F419 Anxiety disorder, unspecified: Secondary | ICD-10-CM | POA: Diagnosis not present

## 2020-07-04 DIAGNOSIS — E1142 Type 2 diabetes mellitus with diabetic polyneuropathy: Secondary | ICD-10-CM | POA: Diagnosis not present

## 2020-07-04 DIAGNOSIS — F0150 Vascular dementia without behavioral disturbance: Secondary | ICD-10-CM | POA: Diagnosis not present

## 2020-07-04 DIAGNOSIS — Z9181 History of falling: Secondary | ICD-10-CM | POA: Diagnosis not present

## 2020-07-04 DIAGNOSIS — G4730 Sleep apnea, unspecified: Secondary | ICD-10-CM | POA: Diagnosis not present

## 2020-07-04 DIAGNOSIS — I2510 Atherosclerotic heart disease of native coronary artery without angina pectoris: Secondary | ICD-10-CM | POA: Diagnosis not present

## 2020-07-04 DIAGNOSIS — S065X0D Traumatic subdural hemorrhage without loss of consciousness, subsequent encounter: Secondary | ICD-10-CM | POA: Diagnosis not present

## 2020-07-10 DIAGNOSIS — F0150 Vascular dementia without behavioral disturbance: Secondary | ICD-10-CM | POA: Diagnosis not present

## 2020-07-10 DIAGNOSIS — F419 Anxiety disorder, unspecified: Secondary | ICD-10-CM | POA: Diagnosis not present

## 2020-07-10 DIAGNOSIS — I2510 Atherosclerotic heart disease of native coronary artery without angina pectoris: Secondary | ICD-10-CM | POA: Diagnosis not present

## 2020-07-10 DIAGNOSIS — I10 Essential (primary) hypertension: Secondary | ICD-10-CM | POA: Diagnosis not present

## 2020-07-10 DIAGNOSIS — G4730 Sleep apnea, unspecified: Secondary | ICD-10-CM | POA: Diagnosis not present

## 2020-07-10 DIAGNOSIS — E1142 Type 2 diabetes mellitus with diabetic polyneuropathy: Secondary | ICD-10-CM | POA: Diagnosis not present

## 2020-07-10 DIAGNOSIS — J449 Chronic obstructive pulmonary disease, unspecified: Secondary | ICD-10-CM | POA: Diagnosis not present

## 2020-07-10 DIAGNOSIS — Z9181 History of falling: Secondary | ICD-10-CM | POA: Diagnosis not present

## 2020-07-10 DIAGNOSIS — S065X0D Traumatic subdural hemorrhage without loss of consciousness, subsequent encounter: Secondary | ICD-10-CM | POA: Diagnosis not present

## 2020-07-13 DIAGNOSIS — E1142 Type 2 diabetes mellitus with diabetic polyneuropathy: Secondary | ICD-10-CM | POA: Diagnosis not present

## 2020-07-13 DIAGNOSIS — I10 Essential (primary) hypertension: Secondary | ICD-10-CM | POA: Diagnosis not present

## 2020-07-13 DIAGNOSIS — G4730 Sleep apnea, unspecified: Secondary | ICD-10-CM | POA: Diagnosis not present

## 2020-07-13 DIAGNOSIS — J449 Chronic obstructive pulmonary disease, unspecified: Secondary | ICD-10-CM | POA: Diagnosis not present

## 2020-07-13 DIAGNOSIS — Z9181 History of falling: Secondary | ICD-10-CM | POA: Diagnosis not present

## 2020-07-13 DIAGNOSIS — I2510 Atherosclerotic heart disease of native coronary artery without angina pectoris: Secondary | ICD-10-CM | POA: Diagnosis not present

## 2020-07-13 DIAGNOSIS — S065X0D Traumatic subdural hemorrhage without loss of consciousness, subsequent encounter: Secondary | ICD-10-CM | POA: Diagnosis not present

## 2020-07-13 DIAGNOSIS — F419 Anxiety disorder, unspecified: Secondary | ICD-10-CM | POA: Diagnosis not present

## 2020-07-13 DIAGNOSIS — F0150 Vascular dementia without behavioral disturbance: Secondary | ICD-10-CM | POA: Diagnosis not present

## 2020-07-14 DIAGNOSIS — R2681 Unsteadiness on feet: Secondary | ICD-10-CM | POA: Diagnosis not present

## 2020-07-14 DIAGNOSIS — R4182 Altered mental status, unspecified: Secondary | ICD-10-CM | POA: Diagnosis not present

## 2020-07-14 DIAGNOSIS — R262 Difficulty in walking, not elsewhere classified: Secondary | ICD-10-CM | POA: Diagnosis not present

## 2020-07-14 DIAGNOSIS — J189 Pneumonia, unspecified organism: Secondary | ICD-10-CM | POA: Diagnosis not present

## 2020-07-15 ENCOUNTER — Emergency Department (HOSPITAL_COMMUNITY): Payer: Medicare PPO

## 2020-07-15 ENCOUNTER — Other Ambulatory Visit: Payer: Self-pay

## 2020-07-15 ENCOUNTER — Encounter (HOSPITAL_COMMUNITY): Payer: Self-pay | Admitting: Emergency Medicine

## 2020-07-15 ENCOUNTER — Emergency Department (HOSPITAL_COMMUNITY)
Admission: EM | Admit: 2020-07-15 | Discharge: 2020-07-16 | Disposition: A | Discharge status: Home / Self Care | Payer: Medicare PPO | Attending: Emergency Medicine | Admitting: Emergency Medicine

## 2020-07-15 DIAGNOSIS — E1165 Type 2 diabetes mellitus with hyperglycemia: Secondary | ICD-10-CM | POA: Insufficient documentation

## 2020-07-15 DIAGNOSIS — W19XXXA Unspecified fall, initial encounter: Secondary | ICD-10-CM

## 2020-07-15 DIAGNOSIS — I2510 Atherosclerotic heart disease of native coronary artery without angina pectoris: Secondary | ICD-10-CM | POA: Diagnosis not present

## 2020-07-15 DIAGNOSIS — R519 Headache, unspecified: Secondary | ICD-10-CM | POA: Diagnosis not present

## 2020-07-15 DIAGNOSIS — I110 Hypertensive heart disease with heart failure: Secondary | ICD-10-CM | POA: Diagnosis not present

## 2020-07-15 DIAGNOSIS — F0390 Unspecified dementia without behavioral disturbance: Secondary | ICD-10-CM | POA: Diagnosis not present

## 2020-07-15 DIAGNOSIS — I5030 Unspecified diastolic (congestive) heart failure: Secondary | ICD-10-CM | POA: Diagnosis not present

## 2020-07-15 DIAGNOSIS — W010XXA Fall on same level from slipping, tripping and stumbling without subsequent striking against object, initial encounter: Secondary | ICD-10-CM | POA: Insufficient documentation

## 2020-07-15 DIAGNOSIS — Z87891 Personal history of nicotine dependence: Secondary | ICD-10-CM | POA: Diagnosis not present

## 2020-07-15 DIAGNOSIS — Z79899 Other long term (current) drug therapy: Secondary | ICD-10-CM | POA: Diagnosis not present

## 2020-07-15 DIAGNOSIS — I10 Essential (primary) hypertension: Secondary | ICD-10-CM | POA: Diagnosis not present

## 2020-07-15 DIAGNOSIS — J449 Chronic obstructive pulmonary disease, unspecified: Secondary | ICD-10-CM | POA: Diagnosis not present

## 2020-07-15 DIAGNOSIS — S098XXA Other specified injuries of head, initial encounter: Secondary | ICD-10-CM | POA: Diagnosis not present

## 2020-07-15 DIAGNOSIS — S0990XA Unspecified injury of head, initial encounter: Secondary | ICD-10-CM | POA: Insufficient documentation

## 2020-07-15 DIAGNOSIS — Y92129 Unspecified place in nursing home as the place of occurrence of the external cause: Secondary | ICD-10-CM | POA: Insufficient documentation

## 2020-07-15 DIAGNOSIS — Z043 Encounter for examination and observation following other accident: Secondary | ICD-10-CM | POA: Diagnosis not present

## 2020-07-15 DIAGNOSIS — M542 Cervicalgia: Secondary | ICD-10-CM | POA: Diagnosis not present

## 2020-07-15 LAB — CBC
HCT: 40.6 % (ref 39.0–52.0)
Hemoglobin: 13 g/dL (ref 13.0–17.0)
MCH: 28.2 pg (ref 26.0–34.0)
MCHC: 32 g/dL (ref 30.0–36.0)
MCV: 88.1 fL (ref 80.0–100.0)
Platelets: 140 10*3/uL — ABNORMAL LOW (ref 150–400)
RBC: 4.61 MIL/uL (ref 4.22–5.81)
RDW: 13.9 % (ref 11.5–15.5)
WBC: 8.5 10*3/uL (ref 4.0–10.5)
nRBC: 0 % (ref 0.0–0.2)

## 2020-07-15 LAB — URINALYSIS, ROUTINE W REFLEX MICROSCOPIC
Bilirubin Urine: NEGATIVE
Glucose, UA: >=500 mg/dL — AB
Hgb urine dipstick: NEGATIVE
Ketones, ur: NEGATIVE mg/dL
Nitrite: NEGATIVE
Protein, ur: NEGATIVE mg/dL
Specific Gravity, Urine: 1.031 — ABNORMAL HIGH (ref 1.005–1.030)
pH: 5 (ref 5.0–8.0)

## 2020-07-15 LAB — BASIC METABOLIC PANEL
Anion gap: 11 (ref 5–15)
BUN: 26 mg/dL — ABNORMAL HIGH (ref 8–23)
CO2: 26 mmol/L (ref 22–32)
Calcium: 9.4 mg/dL (ref 8.9–10.3)
Chloride: 103 mmol/L (ref 98–111)
Creatinine, Ser: 1.11 mg/dL (ref 0.61–1.24)
GFR, Estimated: >60 mL/min (ref >60)
Glucose, Bld: 421 mg/dL — ABNORMAL HIGH (ref 70–99)
Potassium: 4.2 mmol/L (ref 3.5–5.1)
Sodium: 140 mmol/L (ref 135–145)

## 2020-07-15 LAB — CBG MONITORING, ED: Glucose-Capillary: 443 mg/dL — ABNORMAL HIGH (ref 70–99)

## 2020-07-15 NOTE — ED Notes (Signed)
Pt had episode of urinary incontinence.  Pt straight cath'd for urine sample

## 2020-07-15 NOTE — ED Notes (Signed)
Pt to be discharged back to [facility]. Report called to facility. Transport set up.

## 2020-07-15 NOTE — ED Notes (Signed)
Pt resting in stretcher. C-collar in place.

## 2020-07-15 NOTE — ED Notes (Signed)
Ptar called by Amiah Frohlich 

## 2020-07-15 NOTE — ED Provider Notes (Addendum)
[facility] EMERGENCY DEPARTMENT Provider Note   CSN: [id number] Arrival date & time: 07/15/20  1140     History Chief Complaint  Patient presents with  . Fall    Dean Harris is a 79 y.o. male with PMH of DM, CAD, HLD, HTN, and dementia on Aricept who presents the ED via [facility] from [facility] after patient sustained a mechanical fall.  CBG noted to be elevated to 579 by EMS.  Patient arrives in c-collar.  On my examination, patient is pulling at his c-collar and EKG leads.  He does not recall his fall.  Evidently he is alert and oriented per his baseline dementia.  Will call [facility] for further information.  In the interim, will obtain basic laboratory work-up.  Level 5 caveat due to dementia.  HPI     Past Medical History:  Diagnosis Date  . Chronic stable angina (HCC)   . 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 Depression   . Diabetes mellitus   . Essential hypertension   . Glaucoma   . Grade I diastolic dysfunction   . Hyperlipidemia with target LDL less than 70   . Memory difficulties   . 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 Pneumonia    x2  . PTSD (post-traumatic stress disorder) - with depression symptoms    Recently started on medication by the [place]. This has helped his memory issues.  . Thrombocytopenia (HCC) 04/29/2014  . Vitamin D deficiency     Patient Active Problem List   Diagnosis Date Noted  . Acute subdural hematoma (HCC) 05/10/2020  . Hyperlipidemia with target LDL less than 70   . Glaucoma   . Grade I diastolic dysfunction   . Acute on chronic kidney failure (HCC)   . Dementia (HCC)   . AMS (altered mental status) 01/11/2020  . CAP (community acquired pneumonia) 01/10/2020  . Weight loss, non-intentional  05/20/2019  . Depression, recurrent (HCC) 12/01/2017  . Memory disorder 12/01/2017  . Carotid artery plaque, bilateral; s/p Bilateral CEA 07/24/2016  . Syncope 01/28/2016  . Moderate aortic stenosis 01/28/2016  . Chronic stable angina (HCC) 01/28/2016  . BMI 30.0-30.9,adult 07/09/2015  . Thrombocytopenia (HCC) 04/29/2014  . Essential hypertension, benign 03/03/2014  . Coronary artery disease, non-occlusive   . Sleep apnea 11/30/2010  . Gout 11/30/2010  . Hyperlipidemia associated with type 2 diabetes mellitus (HCC) 11/30/2010  . Rosacea 11/30/2010  . COPD (chronic obstructive pulmonary disease) (HCC) 11/30/2010  . ED (erectile dysfunction) 11/30/2010  . Peripheral neuropathy 11/30/2010  . Type 2 diabetes mellitus (HCC) 09/16/1999    Past Surgical History:  Procedure Laterality Date  . CAROTID ENDARTERECTOMY    . LEFT HEART CATHETERIZATION WITH CORONARY ANGIOGRAM N/A 02/13/2012   Procedure: LEFT HEART CATHETERIZATION WITH CORONARY ANGIOGRAM;  Surgeon: Donato Schultz, MD;  Location: [facility] CATH LAB;  Service: Cardiovascular: Diffuse mid LAD 50%, 50%pOM1, 50%mRCA -> medical management  . TRANSTHORACIC ECHOCARDIOGRAM  01/2012   EF 55-60%. Mild LVH. No RWMA, mild Aortic Stenosis (mean/peak gradient 13 mmHg/21 mmHg)  . TRANSTHORACIC ECHOCARDIOGRAM  May 2017, May 2019   A) EF 60-65% w/o RWMA. Normal DF for age. Mod AD (mean/peak gradient 24/35 mmHg); b) EF 60-65%, mod LVH, Mod AS (Mean gradient (S): 28 mm Hg. Peak 43 mmHg  .  US CAROTID DOPPLER BILATERAL ([facility] HX) Bilateral 02/05/2016   Stable mild-to-moderate disease with bilateral heterogeneous plaque. RICA - 1-39%, LICA 40-59%. Bilateral subclavian and vertebral arteries normal.       Family History  Problem Relation Age of Onset  . Diabetes Mother   . Stroke Mother   . Hypertension Mother   . Hyperlipidemia Mother   . Cancer Father   . Hypertension Brother   . Cancer Other     Social History   Tobacco Use  . Smoking status:  Former Smoker    Types: Cigarettes    Quit date: 02/10/1988    Years since quitting: 32.4  . Smokeless tobacco: Never Used  Vaping Use  . Vaping Use: Never used  Substance Use Topics  . Alcohol use: No  . Drug use: No    Home Medications Prior to Admission medications   Medication Sig Start Date End Date Taking? Authorizing Provider  acetaminophen (TYLENOL) 500 MG tablet Take 500 mg by mouth every 6 (six) hours as needed (for fever 99.5-101 F, minor headaches or discomfort- report any fever >101 F, severe headache or discomfort).    [provider]  alum & mag hydroxide-simeth (MI-ACID) 200-200-20 MG/5ML suspension Take 30 mLs by mouth every 6 (six) hours as needed for indigestion or heartburn.    [provider]  clonazePAM (KLONOPIN) 0.5 MG tablet Take 0.5 mg by mouth 2 (two) times daily as needed for anxiety.    [provider]  donepezil (ARICEPT) 10 MG tablet TAKE 1/2 (ONE-HALF) TABLET BY MOUTH AT BEDTIME (Needs to be seen before next refill) Patient taking differently: Take 5 mg by mouth at bedtime.  03/22/20   Daphine Deutscher, Mary-Margaret, FNP  guaifenesin (ROBAFEN) 100 MG/5ML syrup Take 200 mg by mouth every 6 (six) hours as needed for cough.    [provider]  isosorbide mononitrate (IMDUR) 30 MG 24 hr tablet Take 1 tablet (30 mg total) by mouth daily. 03/22/20   Daphine Deutscher, Mary-Margaret, FNP  levETIRAcetam (KEPPRA) 500 MG tablet Take 1 tablet (500 mg total) by mouth 2 (two) times daily for 7 days. 05/11/20 05/18/20  Regalado, Belkys A, MD  lisinopril (ZESTRIL) 2.5 MG tablet Take 2 tablets (5 mg total) by mouth daily. 05/11/20   Regalado, Belkys A, MD  loperamide (IMODIUM) 2 MG capsule Take 2 mg by mouth as needed (with each loose stool/diarrhea- Max of 8 doses/24 hours- REPORT ANY BLOOD).    [provider]  magnesium hydroxide (MILK OF MAGNESIA) 400 MG/5ML suspension Take 30 mLs by mouth at bedtime as needed for mild constipation.    [provider]  memantine (NAMENDA) 10 MG tablet Take 1 tablet (10 mg total) by mouth 2 (two) times daily. 03/22/20   Bennie Pierini, FNP  Neomycin-Bacitracin-Polymyxin (TRIPLE ANTIBIOTIC) [phone] OINT Apply 1 application topically See admin instructions. Apply to minor skin tears or abrasions after cleansing with normal saline- cover with Band-Aids or gauze & tape    [provider]  nitroGLYCERIN (NITROSTAT) 0.4 MG SL tablet Use 1 tabket under tongue at onset and may repeat every 5 minutes x2 Patient taking differently: Place 0.4 mg under the tongue every 5 (five) minutes x 3 doses as needed for chest pain (CALL MD, IF NO RELIEF).  02/15/20   Daphine Deutscher, Mary-Margaret, FNP  nystatin (MYCOSTATIN) 100000 UNIT/ML suspension Take 5 mLs (500,000 Units total) by mouth 4 (four) times daily. 05/11/20   Regalado, Belkys A, MD  sertraline (ZOLOFT) 100 MG tablet Take 2  tablets (200 mg total) by mouth in the morning. 03/22/20   Daphine Deutscher, Mary-Margaret, FNP  Travoprost, BAK Free, (TRAVATAN) 0.004 % SOLN ophthalmic solution Place 1 drop into both eyes at bedtime. 03/22/20   Daphine Deutscher Mary-Margaret, FNP    Allergies    Dust mite extract, Mold extract [trichophyton], Pollen extract, and Tetracyclines & related  Review of Systems   Review of Systems Level 5 caveat due to dementia.  Physical Exam Updated Vital Signs BP 140/61   Pulse 67   Temp 98.8 F (37.1 C) (Axillary)   Resp 15   Ht 5\' 2"  (1.575 m)   Wt 66.7 kg   SpO2 93%   BMI 26.90 kg/m   Physical Exam Physical Exam Vitals and nursing note reviewed. Exam conducted with a chaperone present.  Constitutional:      Appearance: Normal appearance.  HENT:     Head: Normocephalic.  No obvious evidence of trauma.  No bruising.  No significant hematoma.  No lacerations.  No palpable skull defects. Eyes:     General: No scleral icterus.    Conjunctiva/sclera: Conjunctivae normal.  Neck:     ROM intact.  No midline cervical TTP.  Cervical  collar not removed. Cardiovascular:     Rate and Rhythm: Normal rate and regular rhythm.     Pulses: Normal pulses.     Heart sounds: Normal heart sounds.  Abdomen:    Soft, nondistended.  No focal TTP.  No guarding. Pulmonary:     Effort: Pulmonary effort is normal. No respiratory distress.     Breath sounds: Normal breath sounds.  Musculoskeletal:     Cervical back: Moving all extremities.  No significant bony TTP. Skin:    General: Skin is dry.  Neurological:     Mental Status: He is alert.  Psychiatric:        Mood and Affect: Mood normal.        Behavior: Behavior normal.        Thought Content: Thought content normal.   ED Results / Procedures / Treatments   Labs (all labs ordered are listed, but only abnormal results are displayed) Labs Reviewed  BASIC METABOLIC PANEL - Abnormal; Notable for the following components:      Result Value   Glucose, Bld 421 (*)    BUN 26 (*)    All other components within normal limits  CBC - Abnormal; Notable for the following components:   Platelets 140 (*)    All other components within normal limits  URINALYSIS, ROUTINE W REFLEX MICROSCOPIC - Abnormal; Notable for the following components:   APPearance HAZY (*)    Specific Gravity, Urine 1.031 (*)    Glucose, UA >=500 (*)    Leukocytes,Ua MODERATE (*)    Bacteria, UA RARE (*)    All other components within normal limits  CBG MONITORING, ED - Abnormal; Notable for the following components:   Glucose-Capillary 443 (*)    All other components within normal limits    EKG EKG Interpretation  Date/Time:  Sunday July 15 2020 11:49:14 EDT Ventricular Rate:  72 PR Interval:    QRS Duration: 92 QT Interval:  423 QTC Calculation: 463 R Axis:   76 Text Interpretation: Normal sinus rhythm Confirmed by 11-10-1990 [phone]) on 07/15/2020 1:39:24 PM   Radiology DG Chest 2 View  Result Date: 07/15/2020 CLINICAL DATA:  Fall EXAM: CHEST - 2 VIEW COMPARISON:  05/09/2020 FINDINGS:  Stable mild interstitial prominence. No effusion pneumothorax. Stable cardiomediastinal contours with normal  heart size. IMPRESSION: No acute process in the chest. Electronically Signed   By: Guadlupe Spanish M.D.   On: 07/15/2020 13:48   DG Pelvis 1-2 Views  Result Date: 07/15/2020 CLINICAL DATA:  Dementia.  Fell. EXAM: PELVIS - 1-2 VIEW COMPARISON:  CT scan 01/10/2020 FINDINGS: Both hips are normally located. No acute hip or pelvic fractures are identified. The pubic symphysis and SI joints are intact. Extensive vascular calcifications. IMPRESSION: No acute bony findings. Electronically Signed   By: Rudie Meyer M.D.   On: 07/15/2020 13:50   CT Head Wo Contrast  Result Date: 07/15/2020 CLINICAL DATA:  Fall from standing with head and neck pain. EXAM: CT HEAD WITHOUT CONTRAST CT CERVICAL SPINE WITHOUT CONTRAST TECHNIQUE: Multidetector CT imaging of the head and cervical spine was performed following the standard protocol without intravenous contrast. Multiplanar CT image reconstructions of the cervical spine were also generated. COMPARISON:  CT head and cervical spine dated 05/10/2020. FINDINGS: CT HEAD FINDINGS Brain: No evidence of acute infarction, hemorrhage, hydrocephalus, extra-axial collection or mass lesion/mass effect. There is moderate to severe cerebral volume loss with associated ex vacuo dilatation. Periventricular white matter hypoattenuation likely represents chronic small vessel ischemic disease. The previously seen subdural hematoma along the right cerebral convexity has since resolved. Vascular: There are vascular calcifications in the carotid siphons. Skull: Normal. Negative for fracture or focal lesion. Sinuses/Orbits: No acute finding. Other: None. CT CERVICAL SPINE FINDINGS Alignment: Normal. Skull base and vertebrae: No acute fracture. No primary bone lesion or focal pathologic process. Soft tissues and spinal canal: No prevertebral fluid or swelling. No visible canal hematoma. Disc  levels: Moderate multilevel degenerative disc and joint disease. Upper chest: Negative. Other: None. IMPRESSION: 1. No acute intracranial process. 2. No acute osseous injury in the cervical spine. Electronically Signed   By: Romona Curls M.D.   On: 07/15/2020 14:48   CT Cervical Spine Wo Contrast  Result Date: 07/15/2020 CLINICAL DATA:  Fall from standing with head and neck pain. EXAM: CT HEAD WITHOUT CONTRAST CT CERVICAL SPINE WITHOUT CONTRAST TECHNIQUE: Multidetector CT imaging of the head and cervical spine was performed following the standard protocol without intravenous contrast. Multiplanar CT image reconstructions of the cervical spine were also generated. COMPARISON:  CT head and cervical spine dated 05/10/2020. FINDINGS: CT HEAD FINDINGS Brain: No evidence of acute infarction, hemorrhage, hydrocephalus, extra-axial collection or mass lesion/mass effect. There is moderate to severe cerebral volume loss with associated ex vacuo dilatation. Periventricular white matter hypoattenuation likely represents chronic small vessel ischemic disease. The previously seen subdural hematoma along the right cerebral convexity has since resolved. Vascular: There are vascular calcifications in the carotid siphons. Skull: Normal. Negative for fracture or focal lesion. Sinuses/Orbits: No acute finding. Other: None. CT CERVICAL SPINE FINDINGS Alignment: Normal. Skull base and vertebrae: No acute fracture. No primary bone lesion or focal pathologic process. Soft tissues and spinal canal: No prevertebral fluid or swelling. No visible canal hematoma. Disc levels: Moderate multilevel degenerative disc and joint disease. Upper chest: Negative. Other: None. IMPRESSION: 1. No acute intracranial process. 2. No acute osseous injury in the cervical spine. Electronically Signed   By: Romona Curls M.D.   On: 07/15/2020 14:48    Procedures Procedures (including critical care time)  Medications Ordered in ED Medications - No  data to display  ED Course  I have reviewed the triage vital signs and the nursing notes.  Pertinent labs & imaging results that were available during my care of the patient were reviewed  by me and considered in my medical decision making (see chart for details).  Results for orders placed or performed during the hospital encounter of 07/15/20  Basic metabolic panel  Result Value Ref Range   Sodium 140 135 - 145 mmol/L   Potassium 4.2 3.5 - 5.1 mmol/L   Chloride 103 98 - 111 mmol/L   CO2 26 22 - 32 mmol/L   Glucose, Bld 421 (H) 70 - 99 mg/dL   BUN 26 (H) 8 - 23 mg/dL   Creatinine, Ser 1.61 0.61 - 1.24 mg/dL   Calcium 9.4 8.9 - 09.6 mg/dL   GFR, Estimated >04 >54 mL/min   Anion gap 11 5 - 15  CBC  Result Value Ref Range   WBC 8.5 4.0 - 10.5 K/uL   RBC 4.61 4.22 - 5.81 MIL/uL   Hemoglobin 13.0 13.0 - 17.0 g/dL   HCT 09.8 39 - 52 %   MCV 88.1 80.0 - 100.0 fL   MCH 28.2 26.0 - 34.0 pg   MCHC 32.0 30.0 - 36.0 g/dL   RDW 11.9 14.7 - 82.9 %   Platelets 140 (L) 150 - 400 K/uL   nRBC 0.0 0.0 - 0.2 %  Urinalysis, Routine w reflex microscopic Urine, Catheterized  Result Value Ref Range   Color, Urine YELLOW YELLOW   APPearance HAZY (A) CLEAR   Specific Gravity, Urine 1.031 (H) 1.005 - 1.030   pH 5.0 5.0 - 8.0   Glucose, UA >=500 (A) NEGATIVE mg/dL   Hgb urine dipstick NEGATIVE NEGATIVE   Bilirubin Urine NEGATIVE NEGATIVE   Ketones, ur NEGATIVE NEGATIVE mg/dL   Protein, ur NEGATIVE NEGATIVE mg/dL   Nitrite NEGATIVE NEGATIVE   Leukocytes,Ua MODERATE (A) NEGATIVE   RBC / HPF 0-5 0 - 5 RBC/hpf   WBC, UA 21-50 0 - 5 WBC/hpf   Bacteria, UA RARE (A) NONE SEEN  CBG monitoring, ED  Result Value Ref Range   Glucose-Capillary 443 (H) 70 - 99 mg/dL   DG Chest 2 View  Result Date: 07/15/2020 CLINICAL DATA:  Fall EXAM: CHEST - 2 VIEW COMPARISON:  05/09/2020 FINDINGS: Stable mild interstitial prominence. No effusion pneumothorax. Stable cardiomediastinal contours with normal heart size.  IMPRESSION: No acute process in the chest. Electronically Signed   By: Guadlupe Spanish M.D.   On: 07/15/2020 13:48   DG Pelvis 1-2 Views  Result Date: 07/15/2020 CLINICAL DATA:  Dementia.  Fell. EXAM: PELVIS - 1-2 VIEW COMPARISON:  CT scan 01/10/2020 FINDINGS: Both hips are normally located. No acute hip or pelvic fractures are identified. The pubic symphysis and SI joints are intact. Extensive vascular calcifications. IMPRESSION: No acute bony findings. Electronically Signed   By: Rudie Meyer M.D.   On: 07/15/2020 13:50   CT Head Wo Contrast  Result Date: 07/15/2020 CLINICAL DATA:  Fall from standing with head and neck pain. EXAM: CT HEAD WITHOUT CONTRAST CT CERVICAL SPINE WITHOUT CONTRAST TECHNIQUE: Multidetector CT imaging of the head and cervical spine was performed following the standard protocol without intravenous contrast. Multiplanar CT image reconstructions of the cervical spine were also generated. COMPARISON:  CT head and cervical spine dated 05/10/2020. FINDINGS: CT HEAD FINDINGS Brain: No evidence of acute infarction, hemorrhage, hydrocephalus, extra-axial collection or mass lesion/mass effect. There is moderate to severe cerebral volume loss with associated ex vacuo dilatation. Periventricular white matter hypoattenuation likely represents chronic small vessel ischemic disease. The previously seen subdural hematoma along the right cerebral convexity has since resolved. Vascular: There are vascular calcifications in the  carotid siphons. Skull: Normal. Negative for fracture or focal lesion. Sinuses/Orbits: No acute finding. Other: None. CT CERVICAL SPINE FINDINGS Alignment: Normal. Skull base and vertebrae: No acute fracture. No primary bone lesion or focal pathologic process. Soft tissues and spinal canal: No prevertebral fluid or swelling. No visible canal hematoma. Disc levels: Moderate multilevel degenerative disc and joint disease. Upper chest: Negative. Other: None. IMPRESSION: 1. No  acute intracranial process. 2. No acute osseous injury in the cervical spine. Electronically Signed   By: Romona Curls M.D.   On: 07/15/2020 14:48   CT Cervical Spine Wo Contrast  Result Date: 07/15/2020 CLINICAL DATA:  Fall from standing with head and neck pain. EXAM: CT HEAD WITHOUT CONTRAST CT CERVICAL SPINE WITHOUT CONTRAST TECHNIQUE: Multidetector CT imaging of the head and cervical spine was performed following the standard protocol without intravenous contrast. Multiplanar CT image reconstructions of the cervical spine were also generated. COMPARISON:  CT head and cervical spine dated 05/10/2020. FINDINGS: CT HEAD FINDINGS Brain: No evidence of acute infarction, hemorrhage, hydrocephalus, extra-axial collection or mass lesion/mass effect. There is moderate to severe cerebral volume loss with associated ex vacuo dilatation. Periventricular white matter hypoattenuation likely represents chronic small vessel ischemic disease. The previously seen subdural hematoma along the right cerebral convexity has since resolved. Vascular: There are vascular calcifications in the carotid siphons. Skull: Normal. Negative for fracture or focal lesion. Sinuses/Orbits: No acute finding. Other: None. CT CERVICAL SPINE FINDINGS Alignment: Normal. Skull base and vertebrae: No acute fracture. No primary bone lesion or focal pathologic process. Soft tissues and spinal canal: No prevertebral fluid or swelling. No visible canal hematoma. Disc levels: Moderate multilevel degenerative disc and joint disease. Upper chest: Negative. Other: None. IMPRESSION: 1. No acute intracranial process. 2. No acute osseous injury in the cervical spine. Electronically Signed   By: Romona Curls M.D.   On: 07/15/2020 14:48        MDM Rules/Calculators/A&P                          I obtained history from Katie at the facility reports that it was an observed fall, personally by her.  She states that he was standing up out of his wheelchair  and he is typically a 2 person assist.  Before they could get to him, he fell and struck his head on the door.  He reports that he was at his cognitive baseline prior to his fall.  They then immediately transferred him to a wheelchair and called EMS.  She reports that he has recently been treated with Keflex for a UTI.  Otherwise, he has been at his baseline health.  No new changes.  She notes that he does have a small sacral region ulcer that is being managed.  Comprehensive imaging including CT head and cervical spine in addition to plain films of chest and pelvis were obtained, personally reviewed, and without any acute or emergent pathology.  He is not complaining of any significant pain during my examination and his cervical collar has been removed.  We will discuss findings with family and then arrange for transport back to his care facility.  Encouraged them to discuss his hyperglycemia with a primary care provider.  Discussed case with Dr. Rosalia Hammers who agrees with plan.  Final Clinical Impression(s) / ED Diagnoses Final diagnoses:  Fall, initial encounter    Rx / DC Orders ED Discharge Orders    None  Lorelee NewGreen, Jaice Lague L, PA-C 07/15/20 1617    Lorelee NewGreen, Shaun Runyon L, PA-C 07/15/20 1621    Margarita Grizzleay, Danielle, MD 07/16/20 1517

## 2020-07-15 NOTE — Discharge Instructions (Addendum)
Patient is a significant fall risk.  CT was obtained of his head and cervical spine and without any acute findings.  There were also plain films obtained of your chest x-ray which were negative for fracture or other bony abnormalities  He was notably hyperglycemic here in the ED, however no metabolic acidosis.  Please follow-up regarding his elevated sugars with primary care provider.  Additionally, please ensure that he is receiving adequate oral hydration as I suspect that there is an element of dehydration.  Return to the ED or seek immediate medical attention for any new or worsening symptoms.

## 2020-07-15 NOTE — ED Notes (Signed)
[phone]. Poa/ grand daughter- [name]- would like an update

## 2020-07-15 NOTE — ED Triage Notes (Signed)
Pt brought to ED by [facility] for [facility] after pt fell from standing. Pt has dementia at base line, no blood thinners, AO x 2 as normal. BP 104/76, HR 88, R 20, SPO2 98% RA cbg 579.

## 2020-07-16 DIAGNOSIS — Z9181 History of falling: Secondary | ICD-10-CM | POA: Diagnosis not present

## 2020-07-16 DIAGNOSIS — F0150 Vascular dementia without behavioral disturbance: Secondary | ICD-10-CM | POA: Diagnosis not present

## 2020-07-16 DIAGNOSIS — I10 Essential (primary) hypertension: Secondary | ICD-10-CM | POA: Diagnosis not present

## 2020-07-16 DIAGNOSIS — S065X0D Traumatic subdural hemorrhage without loss of consciousness, subsequent encounter: Secondary | ICD-10-CM | POA: Diagnosis not present

## 2020-07-16 DIAGNOSIS — G4730 Sleep apnea, unspecified: Secondary | ICD-10-CM | POA: Diagnosis not present

## 2020-07-16 DIAGNOSIS — J449 Chronic obstructive pulmonary disease, unspecified: Secondary | ICD-10-CM | POA: Diagnosis not present

## 2020-07-16 DIAGNOSIS — F419 Anxiety disorder, unspecified: Secondary | ICD-10-CM | POA: Diagnosis not present

## 2020-07-16 DIAGNOSIS — M2550 Pain in unspecified joint: Secondary | ICD-10-CM | POA: Diagnosis not present

## 2020-07-16 DIAGNOSIS — Z7401 Bed confinement status: Secondary | ICD-10-CM | POA: Diagnosis not present

## 2020-07-16 DIAGNOSIS — I2510 Atherosclerotic heart disease of native coronary artery without angina pectoris: Secondary | ICD-10-CM | POA: Diagnosis not present

## 2020-07-16 DIAGNOSIS — R4182 Altered mental status, unspecified: Secondary | ICD-10-CM | POA: Diagnosis not present

## 2020-07-16 DIAGNOSIS — E1142 Type 2 diabetes mellitus with diabetic polyneuropathy: Secondary | ICD-10-CM | POA: Diagnosis not present

## 2020-07-17 DIAGNOSIS — S065X0D Traumatic subdural hemorrhage without loss of consciousness, subsequent encounter: Secondary | ICD-10-CM | POA: Diagnosis not present

## 2020-07-17 DIAGNOSIS — Z9181 History of falling: Secondary | ICD-10-CM | POA: Diagnosis not present

## 2020-07-17 DIAGNOSIS — I2510 Atherosclerotic heart disease of native coronary artery without angina pectoris: Secondary | ICD-10-CM | POA: Diagnosis not present

## 2020-07-17 DIAGNOSIS — J449 Chronic obstructive pulmonary disease, unspecified: Secondary | ICD-10-CM | POA: Diagnosis not present

## 2020-07-17 DIAGNOSIS — R296 Repeated falls: Secondary | ICD-10-CM | POA: Diagnosis not present

## 2020-07-17 DIAGNOSIS — I10 Essential (primary) hypertension: Secondary | ICD-10-CM | POA: Diagnosis not present

## 2020-07-17 DIAGNOSIS — G4730 Sleep apnea, unspecified: Secondary | ICD-10-CM | POA: Diagnosis not present

## 2020-07-17 DIAGNOSIS — F419 Anxiety disorder, unspecified: Secondary | ICD-10-CM | POA: Diagnosis not present

## 2020-07-17 DIAGNOSIS — E1142 Type 2 diabetes mellitus with diabetic polyneuropathy: Secondary | ICD-10-CM | POA: Diagnosis not present

## 2020-07-17 DIAGNOSIS — F0150 Vascular dementia without behavioral disturbance: Secondary | ICD-10-CM | POA: Diagnosis not present

## 2020-07-18 ENCOUNTER — Ambulatory Visit: Payer: Medicare PPO | Admitting: Podiatry

## 2020-07-19 DIAGNOSIS — F0150 Vascular dementia without behavioral disturbance: Secondary | ICD-10-CM | POA: Diagnosis not present

## 2020-07-19 DIAGNOSIS — F419 Anxiety disorder, unspecified: Secondary | ICD-10-CM | POA: Diagnosis not present

## 2020-07-19 DIAGNOSIS — G4730 Sleep apnea, unspecified: Secondary | ICD-10-CM | POA: Diagnosis not present

## 2020-07-19 DIAGNOSIS — J449 Chronic obstructive pulmonary disease, unspecified: Secondary | ICD-10-CM | POA: Diagnosis not present

## 2020-07-19 DIAGNOSIS — Z9181 History of falling: Secondary | ICD-10-CM | POA: Diagnosis not present

## 2020-07-19 DIAGNOSIS — S065X0D Traumatic subdural hemorrhage without loss of consciousness, subsequent encounter: Secondary | ICD-10-CM | POA: Diagnosis not present

## 2020-07-19 DIAGNOSIS — I10 Essential (primary) hypertension: Secondary | ICD-10-CM | POA: Diagnosis not present

## 2020-07-19 DIAGNOSIS — I2510 Atherosclerotic heart disease of native coronary artery without angina pectoris: Secondary | ICD-10-CM | POA: Diagnosis not present

## 2020-07-19 DIAGNOSIS — E1142 Type 2 diabetes mellitus with diabetic polyneuropathy: Secondary | ICD-10-CM | POA: Diagnosis not present

## 2020-07-22 DIAGNOSIS — I10 Essential (primary) hypertension: Secondary | ICD-10-CM | POA: Diagnosis not present

## 2020-07-22 DIAGNOSIS — Z9181 History of falling: Secondary | ICD-10-CM | POA: Diagnosis not present

## 2020-07-22 DIAGNOSIS — G4730 Sleep apnea, unspecified: Secondary | ICD-10-CM | POA: Diagnosis not present

## 2020-07-22 DIAGNOSIS — J449 Chronic obstructive pulmonary disease, unspecified: Secondary | ICD-10-CM | POA: Diagnosis not present

## 2020-07-22 DIAGNOSIS — F0150 Vascular dementia without behavioral disturbance: Secondary | ICD-10-CM | POA: Diagnosis not present

## 2020-07-22 DIAGNOSIS — E1142 Type 2 diabetes mellitus with diabetic polyneuropathy: Secondary | ICD-10-CM | POA: Diagnosis not present

## 2020-07-22 DIAGNOSIS — I2510 Atherosclerotic heart disease of native coronary artery without angina pectoris: Secondary | ICD-10-CM | POA: Diagnosis not present

## 2020-07-22 DIAGNOSIS — S065X0D Traumatic subdural hemorrhage without loss of consciousness, subsequent encounter: Secondary | ICD-10-CM | POA: Diagnosis not present

## 2020-07-22 DIAGNOSIS — F419 Anxiety disorder, unspecified: Secondary | ICD-10-CM | POA: Diagnosis not present

## 2020-07-24 DIAGNOSIS — Z9181 History of falling: Secondary | ICD-10-CM | POA: Diagnosis not present

## 2020-07-24 DIAGNOSIS — I2510 Atherosclerotic heart disease of native coronary artery without angina pectoris: Secondary | ICD-10-CM | POA: Diagnosis not present

## 2020-07-24 DIAGNOSIS — J449 Chronic obstructive pulmonary disease, unspecified: Secondary | ICD-10-CM | POA: Diagnosis not present

## 2020-07-24 DIAGNOSIS — I10 Essential (primary) hypertension: Secondary | ICD-10-CM | POA: Diagnosis not present

## 2020-07-24 DIAGNOSIS — G4730 Sleep apnea, unspecified: Secondary | ICD-10-CM | POA: Diagnosis not present

## 2020-07-24 DIAGNOSIS — S065X0D Traumatic subdural hemorrhage without loss of consciousness, subsequent encounter: Secondary | ICD-10-CM | POA: Diagnosis not present

## 2020-07-24 DIAGNOSIS — E1142 Type 2 diabetes mellitus with diabetic polyneuropathy: Secondary | ICD-10-CM | POA: Diagnosis not present

## 2020-07-24 DIAGNOSIS — F419 Anxiety disorder, unspecified: Secondary | ICD-10-CM | POA: Diagnosis not present

## 2020-07-24 DIAGNOSIS — F0150 Vascular dementia without behavioral disturbance: Secondary | ICD-10-CM | POA: Diagnosis not present

## 2020-07-26 DIAGNOSIS — F0150 Vascular dementia without behavioral disturbance: Secondary | ICD-10-CM | POA: Diagnosis not present

## 2020-07-26 DIAGNOSIS — I10 Essential (primary) hypertension: Secondary | ICD-10-CM | POA: Diagnosis not present

## 2020-07-26 DIAGNOSIS — G4730 Sleep apnea, unspecified: Secondary | ICD-10-CM | POA: Diagnosis not present

## 2020-07-26 DIAGNOSIS — S065X0D Traumatic subdural hemorrhage without loss of consciousness, subsequent encounter: Secondary | ICD-10-CM | POA: Diagnosis not present

## 2020-07-26 DIAGNOSIS — Z9181 History of falling: Secondary | ICD-10-CM | POA: Diagnosis not present

## 2020-07-26 DIAGNOSIS — J449 Chronic obstructive pulmonary disease, unspecified: Secondary | ICD-10-CM | POA: Diagnosis not present

## 2020-07-26 DIAGNOSIS — E1142 Type 2 diabetes mellitus with diabetic polyneuropathy: Secondary | ICD-10-CM | POA: Diagnosis not present

## 2020-07-26 DIAGNOSIS — I2510 Atherosclerotic heart disease of native coronary artery without angina pectoris: Secondary | ICD-10-CM | POA: Diagnosis not present

## 2020-07-26 DIAGNOSIS — F419 Anxiety disorder, unspecified: Secondary | ICD-10-CM | POA: Diagnosis not present

## 2020-07-30 DIAGNOSIS — J449 Chronic obstructive pulmonary disease, unspecified: Secondary | ICD-10-CM | POA: Diagnosis not present

## 2020-07-30 DIAGNOSIS — S065X0D Traumatic subdural hemorrhage without loss of consciousness, subsequent encounter: Secondary | ICD-10-CM | POA: Diagnosis not present

## 2020-07-30 DIAGNOSIS — I2510 Atherosclerotic heart disease of native coronary artery without angina pectoris: Secondary | ICD-10-CM | POA: Diagnosis not present

## 2020-07-30 DIAGNOSIS — E119 Type 2 diabetes mellitus without complications: Secondary | ICD-10-CM | POA: Diagnosis not present

## 2020-07-30 DIAGNOSIS — Z9181 History of falling: Secondary | ICD-10-CM | POA: Diagnosis not present

## 2020-07-30 DIAGNOSIS — R3589 Other polyuria: Secondary | ICD-10-CM | POA: Diagnosis not present

## 2020-07-30 DIAGNOSIS — E1142 Type 2 diabetes mellitus with diabetic polyneuropathy: Secondary | ICD-10-CM | POA: Diagnosis not present

## 2020-07-30 DIAGNOSIS — R296 Repeated falls: Secondary | ICD-10-CM | POA: Diagnosis not present

## 2020-07-30 DIAGNOSIS — F419 Anxiety disorder, unspecified: Secondary | ICD-10-CM | POA: Diagnosis not present

## 2020-07-30 DIAGNOSIS — I10 Essential (primary) hypertension: Secondary | ICD-10-CM | POA: Diagnosis not present

## 2020-07-30 DIAGNOSIS — F0150 Vascular dementia without behavioral disturbance: Secondary | ICD-10-CM | POA: Diagnosis not present

## 2020-07-30 DIAGNOSIS — G4730 Sleep apnea, unspecified: Secondary | ICD-10-CM | POA: Diagnosis not present

## 2020-07-31 ENCOUNTER — Emergency Department (HOSPITAL_COMMUNITY): Payer: Medicare PPO

## 2020-07-31 ENCOUNTER — Encounter (HOSPITAL_COMMUNITY): Payer: Self-pay

## 2020-07-31 ENCOUNTER — Inpatient Hospital Stay (HOSPITAL_COMMUNITY)
Admission: EM | Admit: 2020-07-31 | Discharge: 2020-08-06 | DRG: 638 | Disposition: A | Discharge status: Nursing Home (Includes ICF) | Payer: Medicare PPO | Attending: Internal Medicine | Admitting: Internal Medicine

## 2020-07-31 DIAGNOSIS — F32A Depression, unspecified: Secondary | ICD-10-CM | POA: Diagnosis present

## 2020-07-31 DIAGNOSIS — R0902 Hypoxemia: Secondary | ICD-10-CM | POA: Diagnosis not present

## 2020-07-31 DIAGNOSIS — W19XXXS Unspecified fall, sequela: Secondary | ICD-10-CM | POA: Diagnosis present

## 2020-07-31 DIAGNOSIS — R6889 Other general symptoms and signs: Secondary | ICD-10-CM | POA: Diagnosis not present

## 2020-07-31 DIAGNOSIS — E785 Hyperlipidemia, unspecified: Secondary | ICD-10-CM | POA: Diagnosis present

## 2020-07-31 DIAGNOSIS — N179 Acute kidney failure, unspecified: Secondary | ICD-10-CM | POA: Diagnosis present

## 2020-07-31 DIAGNOSIS — Z823 Family history of stroke: Secondary | ICD-10-CM | POA: Diagnosis not present

## 2020-07-31 DIAGNOSIS — S065X0D Traumatic subdural hemorrhage without loss of consciousness, subsequent encounter: Secondary | ICD-10-CM | POA: Diagnosis not present

## 2020-07-31 DIAGNOSIS — L8990 Pressure ulcer of unspecified site, unspecified stage: Secondary | ICD-10-CM | POA: Insufficient documentation

## 2020-07-31 DIAGNOSIS — E875 Hyperkalemia: Secondary | ICD-10-CM | POA: Diagnosis present

## 2020-07-31 DIAGNOSIS — E1165 Type 2 diabetes mellitus with hyperglycemia: Secondary | ICD-10-CM | POA: Diagnosis not present

## 2020-07-31 DIAGNOSIS — Z833 Family history of diabetes mellitus: Secondary | ICD-10-CM | POA: Diagnosis not present

## 2020-07-31 DIAGNOSIS — F0391 Unspecified dementia with behavioral disturbance: Secondary | ICD-10-CM | POA: Diagnosis present

## 2020-07-31 DIAGNOSIS — Z87440 Personal history of urinary (tract) infections: Secondary | ICD-10-CM | POA: Diagnosis not present

## 2020-07-31 DIAGNOSIS — R404 Transient alteration of awareness: Secondary | ICD-10-CM | POA: Diagnosis not present

## 2020-07-31 DIAGNOSIS — I10 Essential (primary) hypertension: Secondary | ICD-10-CM | POA: Diagnosis not present

## 2020-07-31 DIAGNOSIS — Z8249 Family history of ischemic heart disease and other diseases of the circulatory system: Secondary | ICD-10-CM

## 2020-07-31 DIAGNOSIS — Z87891 Personal history of nicotine dependence: Secondary | ICD-10-CM

## 2020-07-31 DIAGNOSIS — F03918 Unspecified dementia, unspecified severity, with other behavioral disturbance: Secondary | ICD-10-CM | POA: Diagnosis present

## 2020-07-31 DIAGNOSIS — Z20822 Contact with and (suspected) exposure to covid-19: Secondary | ICD-10-CM | POA: Diagnosis present

## 2020-07-31 DIAGNOSIS — Z83438 Family history of other disorder of lipoprotein metabolism and other lipidemia: Secondary | ICD-10-CM

## 2020-07-31 DIAGNOSIS — E860 Dehydration: Secondary | ICD-10-CM | POA: Diagnosis present

## 2020-07-31 DIAGNOSIS — I208 Other forms of angina pectoris: Secondary | ICD-10-CM | POA: Diagnosis not present

## 2020-07-31 DIAGNOSIS — F4310 Post-traumatic stress disorder, unspecified: Secondary | ICD-10-CM | POA: Diagnosis present

## 2020-07-31 DIAGNOSIS — R456 Violent behavior: Secondary | ICD-10-CM | POA: Diagnosis present

## 2020-07-31 DIAGNOSIS — E1100 Type 2 diabetes mellitus with hyperosmolarity without nonketotic hyperglycemic-hyperosmolar coma (NKHHC): Secondary | ICD-10-CM | POA: Diagnosis present

## 2020-07-31 DIAGNOSIS — F419 Anxiety disorder, unspecified: Secondary | ICD-10-CM | POA: Diagnosis not present

## 2020-07-31 DIAGNOSIS — I25118 Atherosclerotic heart disease of native coronary artery with other forms of angina pectoris: Secondary | ICD-10-CM | POA: Diagnosis present

## 2020-07-31 DIAGNOSIS — Z743 Need for continuous supervision: Secondary | ICD-10-CM | POA: Diagnosis not present

## 2020-07-31 DIAGNOSIS — E870 Hyperosmolality and hypernatremia: Secondary | ICD-10-CM | POA: Diagnosis present

## 2020-07-31 DIAGNOSIS — R410 Disorientation, unspecified: Secondary | ICD-10-CM | POA: Diagnosis not present

## 2020-07-31 DIAGNOSIS — I152 Hypertension secondary to endocrine disorders: Secondary | ICD-10-CM | POA: Diagnosis present

## 2020-07-31 DIAGNOSIS — E1142 Type 2 diabetes mellitus with diabetic polyneuropathy: Secondary | ICD-10-CM | POA: Diagnosis present

## 2020-07-31 DIAGNOSIS — F0281 Dementia in other diseases classified elsewhere with behavioral disturbance: Secondary | ICD-10-CM | POA: Diagnosis not present

## 2020-07-31 DIAGNOSIS — I2510 Atherosclerotic heart disease of native coronary artery without angina pectoris: Secondary | ICD-10-CM | POA: Diagnosis not present

## 2020-07-31 DIAGNOSIS — E1169 Type 2 diabetes mellitus with other specified complication: Secondary | ICD-10-CM | POA: Diagnosis present

## 2020-07-31 DIAGNOSIS — G309 Alzheimer's disease, unspecified: Secondary | ICD-10-CM | POA: Diagnosis not present

## 2020-07-31 DIAGNOSIS — J449 Chronic obstructive pulmonary disease, unspecified: Secondary | ICD-10-CM | POA: Diagnosis present

## 2020-07-31 DIAGNOSIS — E1159 Type 2 diabetes mellitus with other circulatory complications: Secondary | ICD-10-CM | POA: Diagnosis present

## 2020-07-31 DIAGNOSIS — H409 Unspecified glaucoma: Secondary | ICD-10-CM | POA: Diagnosis present

## 2020-07-31 DIAGNOSIS — R000 Tachycardia, unspecified: Secondary | ICD-10-CM | POA: Diagnosis not present

## 2020-07-31 DIAGNOSIS — S065X9S Traumatic subdural hemorrhage with loss of consciousness of unspecified duration, sequela: Secondary | ICD-10-CM

## 2020-07-31 DIAGNOSIS — Z79899 Other long term (current) drug therapy: Secondary | ICD-10-CM

## 2020-07-31 DIAGNOSIS — R4182 Altered mental status, unspecified: Secondary | ICD-10-CM | POA: Diagnosis not present

## 2020-07-31 DIAGNOSIS — L89151 Pressure ulcer of sacral region, stage 1: Secondary | ICD-10-CM | POA: Diagnosis present

## 2020-07-31 DIAGNOSIS — I959 Hypotension, unspecified: Secondary | ICD-10-CM | POA: Diagnosis present

## 2020-07-31 DIAGNOSIS — R279 Unspecified lack of coordination: Secondary | ICD-10-CM | POA: Diagnosis not present

## 2020-07-31 DIAGNOSIS — R81 Glycosuria: Secondary | ICD-10-CM | POA: Diagnosis present

## 2020-07-31 DIAGNOSIS — Z9181 History of falling: Secondary | ICD-10-CM | POA: Diagnosis not present

## 2020-07-31 DIAGNOSIS — F0150 Vascular dementia without behavioral disturbance: Secondary | ICD-10-CM | POA: Diagnosis not present

## 2020-07-31 DIAGNOSIS — Z888 Allergy status to other drugs, medicaments and biological substances status: Secondary | ICD-10-CM

## 2020-07-31 DIAGNOSIS — R739 Hyperglycemia, unspecified: Secondary | ICD-10-CM

## 2020-07-31 DIAGNOSIS — G4730 Sleep apnea, unspecified: Secondary | ICD-10-CM | POA: Diagnosis not present

## 2020-07-31 LAB — CBC WITH DIFFERENTIAL/PLATELET
Abs Immature Granulocytes: 0.03 10*3/uL (ref 0.00–0.07)
Basophils Absolute: 0.1 10*3/uL (ref 0.0–0.1)
Basophils Relative: 1 %
Eosinophils Absolute: 0 10*3/uL (ref 0.0–0.5)
Eosinophils Relative: 0 %
HCT: 50.3 % (ref 39.0–52.0)
Hemoglobin: 15.6 g/dL (ref 13.0–17.0)
Immature Granulocytes: 0 %
Lymphocytes Relative: 20 %
Lymphs Abs: 2.1 10*3/uL (ref 0.7–4.0)
MCH: 28.2 pg (ref 26.0–34.0)
MCHC: 31 g/dL (ref 30.0–36.0)
MCV: 90.8 fL (ref 80.0–100.0)
Monocytes Absolute: 0.6 10*3/uL (ref 0.1–1.0)
Monocytes Relative: 6 %
Neutro Abs: 7.6 10*3/uL (ref 1.7–7.7)
Neutrophils Relative %: 73 %
Platelets: 174 10*3/uL (ref 150–400)
RBC: 5.54 MIL/uL (ref 4.22–5.81)
RDW: 14.1 % (ref 11.5–15.5)
WBC: 10.6 10*3/uL — ABNORMAL HIGH (ref 4.0–10.5)
nRBC: 0 % (ref 0.0–0.2)

## 2020-07-31 LAB — URINALYSIS, ROUTINE W REFLEX MICROSCOPIC
Bilirubin Urine: NEGATIVE
Glucose, UA: >=500 mg/dL — AB
Ketones, ur: NEGATIVE mg/dL
Nitrite: NEGATIVE
Protein, ur: NEGATIVE mg/dL
Specific Gravity, Urine: 1.028 (ref 1.005–1.030)
pH: 5 (ref 5.0–8.0)

## 2020-07-31 LAB — COMPREHENSIVE METABOLIC PANEL
ALT: 40 U/L (ref 0–44)
AST: 24 U/L (ref 15–41)
Albumin: 4 g/dL (ref 3.5–5.0)
Alkaline Phosphatase: 98 U/L (ref 38–126)
Anion gap: 16 — ABNORMAL HIGH (ref 5–15)
BUN: 50 mg/dL — ABNORMAL HIGH (ref 8–23)
CO2: 24 mmol/L (ref 22–32)
Calcium: 10 mg/dL (ref 8.9–10.3)
Chloride: 109 mmol/L (ref 98–111)
Creatinine, Ser: 1.87 mg/dL — ABNORMAL HIGH (ref 0.61–1.24)
GFR, Estimated: 37 mL/min — ABNORMAL LOW (ref >60)
Glucose, Bld: 779 mg/dL (ref 70–99)
Potassium: 5.2 mmol/L — ABNORMAL HIGH (ref 3.5–5.1)
Sodium: 149 mmol/L — ABNORMAL HIGH (ref 135–145)
Total Bilirubin: 1.6 mg/dL — ABNORMAL HIGH (ref 0.3–1.2)
Total Protein: 7.1 g/dL (ref 6.5–8.1)

## 2020-07-31 LAB — CBG MONITORING, ED
Glucose-Capillary: 311 mg/dL — ABNORMAL HIGH (ref 70–99)
Glucose-Capillary: 412 mg/dL — ABNORMAL HIGH (ref 70–99)
Glucose-Capillary: 513 mg/dL (ref 70–99)
Glucose-Capillary: 523 mg/dL (ref 70–99)
Glucose-Capillary: 596 mg/dL (ref 70–99)

## 2020-07-31 LAB — RESPIRATORY PANEL BY RT PCR (FLU A&B, COVID)
Influenza A by PCR: NEGATIVE
Influenza B by PCR: NEGATIVE
SARS Coronavirus 2 by RT PCR: NEGATIVE

## 2020-07-31 LAB — AMMONIA: Ammonia: <9 umol/L — ABNORMAL LOW (ref 9–35)

## 2020-07-31 LAB — ETHANOL: Alcohol, Ethyl (B): <10 mg/dL (ref <10)

## 2020-07-31 MED ORDER — SERTRALINE HCL 100 MG PO TABS
200.0000 mg | ORAL_TABLET | Freq: Every morning | ORAL | Status: DC
  Administered 2020-08-04 – 2020-08-05 (×2): 200 mg via ORAL
  Filled 2020-07-31 (×2): qty 2

## 2020-07-31 MED ORDER — DEXTROSE 50 % IV SOLN: 0.0000 mL | INTRAVENOUS | Status: DC | PRN

## 2020-07-31 MED ORDER — BUSPIRONE HCL 5 MG PO TABS
10.0000 mg | ORAL_TABLET | Freq: Two times a day (BID) | ORAL | Status: DC | PRN
  Administered 2020-08-02 – 2020-08-05 (×5): 10 mg via ORAL
  Filled 2020-07-31 (×5): qty 2

## 2020-07-31 MED ORDER — LACTATED RINGERS IV SOLN: INTRAVENOUS | Status: DC

## 2020-07-31 MED ORDER — DONEPEZIL HCL 10 MG PO TABS
5.0000 mg | ORAL_TABLET | Freq: Every day | ORAL | Status: DC
  Administered 2020-08-02 – 2020-08-05 (×4): 5 mg via ORAL
  Filled 2020-07-31 (×5): qty 1

## 2020-07-31 MED ORDER — INSULIN REGULAR(HUMAN) IN NACL 100-0.9 UT/100ML-% IV SOLN
INTRAVENOUS | Status: DC
  Administered 2020-07-31: 10 [IU]/h via INTRAVENOUS
  Filled 2020-07-31: qty 100

## 2020-07-31 MED ORDER — MEMANTINE HCL 5 MG PO TABS
10.0000 mg | ORAL_TABLET | Freq: Two times a day (BID) | ORAL | Status: DC
  Administered 2020-08-02 – 2020-08-05 (×7): 10 mg via ORAL
  Filled 2020-07-31 (×10): qty 2

## 2020-07-31 MED ORDER — ACETAMINOPHEN 500 MG PO TABS
500.0000 mg | ORAL_TABLET | Freq: Four times a day (QID) | ORAL | Status: DC | PRN
  Administered 2020-08-03 – 2020-08-04 (×3): 500 mg via ORAL
  Filled 2020-07-31 (×3): qty 1

## 2020-07-31 MED ORDER — DEXTROSE IN LACTATED RINGERS 5 % IV SOLN: INTRAVENOUS | Status: DC

## 2020-07-31 MED ORDER — SODIUM CHLORIDE 0.9 % IV BOLUS
500.0000 mL | Freq: Once | INTRAVENOUS | Status: AC
  Administered 2020-07-31: 500 mL via INTRAVENOUS

## 2020-07-31 MED ORDER — SODIUM CHLORIDE 0.9 % IV BOLUS
1000.0000 mL | Freq: Once | INTRAVENOUS | Status: AC
  Administered 2020-07-31: 1000 mL via INTRAVENOUS

## 2020-07-31 MED ORDER — HEPARIN SODIUM (PORCINE) 5000 UNIT/ML IJ SOLN
5000.0000 [IU] | Freq: Three times a day (TID) | INTRAMUSCULAR | Status: DC
  Administered 2020-07-31 – 2020-08-05 (×13): 5000 [IU] via SUBCUTANEOUS
  Filled 2020-07-31 (×13): qty 1

## 2020-07-31 NOTE — ED Notes (Signed)
Date and time results received: 07/31/20 1859 (use smartphrase ".now" to insert current time)  Test: Blood Glucose Critical Value: 779  Name of Provider Notified: Dr. Madilyn Hook  Orders Received? Or Actions Taken?: Orders Received - See Orders for details

## 2020-07-31 NOTE — ED Triage Notes (Signed)
Patient brought in by ems from [facility] with reports of ams and increased lethargy since today at lunch. Hx of dementia. CBG 343. Family reports patient is at baseline mental status.

## 2020-07-31 NOTE — ED Provider Notes (Signed)
[facility] EMERGENCY DEPARTMENT Provider Note   CSN: [id number] Arrival date & time: 07/31/20  1639     History Chief Complaint  Patient presents with  . Altered Mental Status    Dean Harris is a 79 y.o. male.  The history is provided by the EMS personnel and medical records. No language interpreter was used.  Altered Mental Status  Dean Harris is a 79 y.o. male who presents to the Emergency Department complaining of altered mental status. Level V caveat due to altered mental status. History is provided but EMS. He presents the emergency department for altered mental status and increased lethargy that started today. At baseline he will respond when you talk to him. He is a resident at [facility]. He was recently treated for a UTI with Cipro October 29 through November 5.    Past Medical History:  Diagnosis Date  . Chronic stable angina (HCC)   . 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 Depression   . Diabetes mellitus   . Essential hypertension   . Glaucoma   . Grade I diastolic dysfunction   . Hyperlipidemia with target LDL less than 70   . Memory difficulties   . 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 Pneumonia    x2  . PTSD (post-traumatic stress disorder) - with depression symptoms    Recently started on medication by the [place]. This has helped his memory issues.  . Thrombocytopenia (HCC) 04/29/2014  . Vitamin D deficiency     Patient Active Problem List   Diagnosis Date Noted  . Type 2 diabetes mellitus with hyperosmolar hyperglycemic state (HHS) (HCC) 07/31/2020  . Hypertension associated with diabetes (HCC) 07/31/2020  . Hypernatremia 07/31/2020  . Hyperkalemia 07/31/2020  . Acute subdural hematoma (HCC) 05/10/2020  . Hyperlipidemia with target LDL less than  70   . Glaucoma   . Grade I diastolic dysfunction   . AKI (acute kidney injury) (HCC)   . Dementia with behavioral disturbance (HCC)   . AMS (altered mental status) 01/11/2020  . CAP (community acquired pneumonia) 01/10/2020  . Weight loss, non-intentional 05/20/2019  . Depression, recurrent (HCC) 12/01/2017  . Memory disorder 12/01/2017  . Carotid artery plaque, bilateral; s/p Bilateral CEA 07/24/2016  . Syncope 01/28/2016  . Moderate aortic stenosis 01/28/2016  . Chronic stable angina (HCC) 01/28/2016  . BMI 30.0-30.9,adult 07/09/2015  . Thrombocytopenia (HCC) 04/29/2014  . Essential hypertension, benign 03/03/2014  . Coronary artery disease, non-occlusive   . Sleep apnea 11/30/2010  . Gout 11/30/2010  . Hyperlipidemia associated with type 2 diabetes mellitus (HCC) 11/30/2010  . Rosacea 11/30/2010  . COPD (chronic obstructive pulmonary disease) (HCC) 11/30/2010  . ED (erectile dysfunction) 11/30/2010  . Peripheral neuropathy 11/30/2010  . Type 2 diabetes mellitus (HCC) 09/16/1999    Past Surgical History:  Procedure Laterality Date  . CAROTID ENDARTERECTOMY    . LEFT HEART CATHETERIZATION WITH CORONARY ANGIOGRAM N/A 02/13/2012   Procedure: LEFT HEART CATHETERIZATION WITH CORONARY ANGIOGRAM;  Surgeon: Donato Schultz, MD;  Location: [facility] CATH LAB;  Service: Cardiovascular: Diffuse mid LAD 50%, 50%pOM1, 50%mRCA -> medical management  . TRANSTHORACIC ECHOCARDIOGRAM  01/2012   EF 55-60%. Mild LVH. No RWMA, mild Aortic Stenosis (mean/peak gradient 13 mmHg/21 mmHg)  . TRANSTHORACIC ECHOCARDIOGRAM  May 2017, May 2019   A) EF 60-65%  w/o RWMA. Normal DF for age. Mod AD (mean/peak gradient 24/35 mmHg); b) EF 60-65%, mod LVH, Mod AS (Mean gradient (S): 28 mm Hg. Peak 43 mmHg  . US CAROTID DOPPLER BILATERAL ([facility] HX) Bilateral 02/05/2016   Stable mild-to-moderate disease with bilateral heterogeneous plaque. RICA - 1-39%, LICA 40-59%. Bilateral subclavian and vertebral arteries normal.        Family History  Problem Relation Age of Onset  . Diabetes Mother   . Stroke Mother   . Hypertension Mother   . Hyperlipidemia Mother   . Cancer Father   . Hypertension Brother   . Cancer Other     Social History   Tobacco Use  . Smoking status: Former Smoker    Types: Cigarettes    Quit date: 02/10/1988    Years since quitting: 32.4  . Smokeless tobacco: Never Used  Vaping Use  . Vaping Use: Never used  Substance Use Topics  . Alcohol use: No  . Drug use: No    Home Medications Prior to Admission medications   Medication Sig Start Date End Date Taking? Authorizing Provider  acetaminophen (TYLENOL) 500 MG tablet Take 500 mg by mouth every 6 (six) hours as needed (for fever 99.5-101 F, minor headaches or discomfort- report any fever >101 F, severe headache or discomfort).   Yes [provider]  alum & mag hydroxide-simeth (MI-ACID) 200-200-20 MG/5ML suspension Take 30 mLs by mouth every 6 (six) hours as needed for indigestion or heartburn.   Yes [provider]  busPIRone (BUSPAR) 10 MG tablet Take 10 mg by mouth 2 (two) times daily as needed (for anxiety).   Yes [provider]  donepezil (ARICEPT) 10 MG tablet TAKE 1/2 (ONE-HALF) TABLET BY MOUTH AT BEDTIME (Needs to be seen before next refill) Patient taking differently: Take 5 mg by mouth at bedtime.  03/22/20  Yes Martin, Mary-Margaret, FNP  guaifenesin (ROBAFEN) 100 MG/5ML syrup Take 200 mg by mouth every 6 (six) hours as needed for cough.   Yes [provider]  Infant Care Products [facility]) OINT Apply 1 application topically See admin instructions. Apply topically every 3 hours- with every incontinent change   Yes [provider]  isosorbide mononitrate (IMDUR) 30 MG 24 hr tablet Take 1 tablet (30 mg total) by mouth daily. 03/22/20  Yes Martin, Mary-Margaret, FNP  lisinopril (ZESTRIL) 2.5 MG tablet Take 2 tablets (5 mg total) by mouth daily. 05/11/20  Yes Regalado, Belkys  A, MD  loperamide (IMODIUM) 2 MG capsule Take 2 mg by mouth as needed (with each loose stool/diarrhea- Max of 8 doses/24 hours- REPORT ANY BLOOD).   Yes [provider]  magnesium hydroxide (MILK OF MAGNESIA) 400 MG/5ML suspension Take 30 mLs by mouth at bedtime as needed for mild constipation.   Yes [provider]  memantine (NAMENDA) 10 MG tablet Take 1 tablet (10 mg total) by mouth 2 (two) times daily. 03/22/20  Yes Daphine Deutscher, Mary-Margaret, FNP  Menthol-Zinc Oxide (REMEDY CALAZIME) 0.4-20.5 % PSTE Apply 1 application topically See admin instructions. Apply to the sacrum 3 times a day AND three times a day as needed after incontinence   Yes [provider]  Neomycin-Bacitracin-Polymyxin (TRIPLE ANTIBIOTIC) 3.5-4340208921 OINT Apply 1 application topically See admin instructions. Apply to minor skin tears or abrasions after cleansing with normal saline- cover with Band-Aids or gauze & tape   Yes [provider]  nitroGLYCERIN (NITROSTAT) 0.4 MG SL tablet Use 1 tabket under tongue at onset and may repeat every 5  minutes x2 Patient taking differently: Place 0.4 mg under the tongue every 5 (five) minutes x 3 doses as needed for chest pain (CALL MD, IF NO RELIEF).  02/15/20  Yes Daphine Deutscher, Mary-Margaret, FNP  sertraline (ZOLOFT) 100 MG tablet Take 2 tablets (200 mg total) by mouth in the morning. 03/22/20  Yes Martin, Mary-Margaret, FNP  Travoprost, BAK Free, (TRAVATAN) 0.004 % SOLN ophthalmic solution Place 1 drop into both eyes at bedtime. 03/22/20  Yes Martin, Mary-Margaret, FNP    Allergies    Dust mite extract, Mold extract [trichophyton], Pollen extract, and Tetracyclines & related  Review of Systems   Review of Systems  All other systems reviewed and are negative.   Physical Exam Updated Vital Signs BP (!) 119/102   Pulse 86   Temp 98.4 F (36.9 C) (Oral)   Resp 16   SpO2 98%   Physical Exam Vitals and nursing note reviewed.  Constitutional:      Appearance:  He is well-developed.  HENT:     Head: Normocephalic and atraumatic.     Comments: Dry mucous membranes Cardiovascular:     Rate and Rhythm: Normal rate and regular rhythm.     Heart sounds: No murmur heard.   Pulmonary:     Effort: Pulmonary effort is normal. No respiratory distress.     Breath sounds: Normal breath sounds.  Abdominal:     Palpations: Abdomen is soft.     Tenderness: There is no abdominal tenderness. There is no guarding or rebound.  Musculoskeletal:        General: No tenderness.  Skin:    General: Skin is warm and dry.  Neurological:     Mental Status: He is alert.     Comments: Awake and alert. Mumbles to questions. Does not follow commands. Moves all extremities symmetrically and strongly.  Psychiatric:        Behavior: Behavior normal.     ED Results / Procedures / Treatments   Labs (all labs ordered are listed, but only abnormal results are displayed) Labs Reviewed  COMPREHENSIVE METABOLIC PANEL - Abnormal; Notable for the following components:      Result Value   Sodium 149 (*)    Potassium 5.2 (*)    Glucose, Bld 779 (*)    BUN 50 (*)    Creatinine, Ser 1.87 (*)    Total Bilirubin 1.6 (*)    GFR, Estimated 37 (*)    Anion gap 16 (*)    All other components within normal limits  URINALYSIS, ROUTINE W REFLEX MICROSCOPIC - Abnormal; Notable for the following components:   APPearance HAZY (*)    Glucose, UA >=500 (*)    Hgb urine dipstick MODERATE (*)    Leukocytes,Ua MODERATE (*)    Bacteria, UA RARE (*)    All other components within normal limits  CBC WITH DIFFERENTIAL/PLATELET - Abnormal; Notable for the following components:   WBC 10.6 (*)    All other components within normal limits  AMMONIA - Abnormal; Notable for the following components:   Ammonia <9 (*)    All other components within normal limits  CBG MONITORING, ED - Abnormal; Notable for the following components:   Glucose-Capillary 596 (*)    All other components within  normal limits  CBG MONITORING, ED - Abnormal; Notable for the following components:   Glucose-Capillary 523 (*)    All other components within normal limits  CBG MONITORING, ED - Abnormal; Notable for the following components:   Glucose-Capillary 513 (*)  All other components within normal limits  RESPIRATORY PANEL BY RT PCR (FLU A&B, COVID)  URINE CULTURE  ETHANOL  SODIUM, URINE, RANDOM  CREATININE, URINE, RANDOM  OSMOLALITY  BASIC METABOLIC PANEL  BASIC METABOLIC PANEL  BASIC METABOLIC PANEL  BASIC METABOLIC PANEL  HEMOGLOBIN A1C  CBC  I-STAT CHEM 8, ED    EKG None  Radiology CT Head Wo Contrast  Result Date: 07/31/2020 CLINICAL DATA:  Change in mental status EXAM: CT HEAD WITHOUT CONTRAST TECHNIQUE: Contiguous axial images were obtained from the base of the skull through the vertex without intravenous contrast. COMPARISON:  None. FINDINGS: Brain: No evidence of acute territorial infarction, hemorrhage, hydrocephalus,extra-axial collection or mass lesion/mass effect. There is dilatation the ventricles and sulci consistent with age-related atrophy. Low-attenuation changes in the deep white matter consistent with small vessel ischemia. Vascular: No hyperdense vessel or unexpected calcification. Skull: The skull is intact. No fracture or focal lesion identified. Sinuses/Orbits: The visualized paranasal sinuses and mastoid air cells are clear. The orbits and globes intact. Other: None IMPRESSION: No acute intracranial abnormality. Findings consistent with age related atrophy and chronic small vessel ischemia Electronically Signed   By: Jonna ClarkBindu  Avutu M.D.   On: 07/31/2020 17:43   DG Chest Port 1 View  Result Date: 07/31/2020 CLINICAL DATA:  Altered mental status. EXAM: PORTABLE CHEST 1 VIEW COMPARISON:  07/15/2020 FINDINGS: The cardiac silhouette, mediastinal and hilar contours are within normal limits and stable. Stable tortuosity and calcification of the thoracic aorta. No acute  pulmonary findings. No infiltrates, edema or effusions. No pulmonary lesions. The bony thorax is intact. IMPRESSION: No acute cardiopulmonary findings. Electronically Signed   By: Rudie MeyerP.  Gallerani M.D.   On: 07/31/2020 18:46    Procedures Procedures (including critical care time)  Medications Ordered in ED Medications  heparin injection 5,000 Units (5,000 Units Subcutaneous Given 07/31/20 2148)  insulin regular, human (MYXREDLIN) 100 units/ 100 mL infusion (11.5 Units/hr Intravenous Rate/Dose Change 07/31/20 2216)  lactated ringers infusion (has no administration in time range)  dextrose 5 % in lactated ringers infusion (has no administration in time range)  dextrose 50 % solution 0-50 mL (has no administration in time range)  acetaminophen (TYLENOL) tablet 500 mg (has no administration in time range)  busPIRone (BUSPAR) tablet 10 mg (has no administration in time range)  donepezil (ARICEPT) tablet 5 mg (5 mg Oral Refused 07/31/20 2133)  memantine (NAMENDA) tablet 10 mg (10 mg Oral Refused 07/31/20 2133)  sertraline (ZOLOFT) tablet 200 mg (has no administration in time range)  sodium chloride 0.9 % bolus 500 mL (0 mLs Intravenous Stopped 07/31/20 1900)  sodium chloride 0.9 % bolus 1,000 mL (0 mLs Intravenous Stopped 07/31/20 2134)    ED Course  I have reviewed the triage vital signs and the nursing notes.  Pertinent labs & imaging results that were available during my care of the patient were reviewed by me and considered in my medical decision making (see chart for details).    MDM Rules/Calculators/A&P                         patient with history of dementia here from facility for altered mental status, lethargy. He is dehydrated appearing on evaluation. Labs with significant hyperglycemia, acute kidney injury. He was treated with aggressive IV fluid hydration. Granddaughter and power of attorney updated of findings of studies and plan to admit for ongoing treatment. Medicine consulted  for admission.  Final Clinical Impression(s) / ED Diagnoses Final  diagnoses:  AKI (acute kidney injury) (HCC)  Dehydration  Hyperglycemia    Rx / DC Orders ED Discharge Orders    None       Tilden Fossa, MD 07/31/20 2254

## 2020-07-31 NOTE — H&P (Signed)
History and Physical    Dean Harris JXB:[id number] DOB: 10/26/1940 DOA: 07/31/2020  PCP: Chevis Pretty, FNP  Patient coming from: [facility]  I have personally briefly reviewed patient's old medical records in [place]  Chief Complaint: Lethargy, hyperglycemia  HPI: Dean Harris is a 79 y.o. male with medical history significant for CAD with chronic stable angina, type 2 diabetes, hypertension, PTSD/depression, and dementia with behavioral disturbance who presents to the ED from [place] for evaluation of lethargy and hyperglycemia. Patient is unable to provide history due to baseline dementia and is otherwise obtained from EDP, chart review, and his granddaughter Dean Harris by phone.  Granddaughter states that at baseline, patient has dementia and is essentially nonverbal. He is minimally interactive and will occasionally have mumbled speech. He has been noted to have angry and violent behavior. He has been residing at the [facility] care unit for the last 4 months. INR is husband went to see him today and noticed that he seemed to be more lethargic than usual. He is reported to have been recently treated for UTI with ciprofloxacin from 10/29 >> 11/5. [facility] staff reported that he was having shouting outbursts over the weekend. He has been noted to have increased urinary frequency. Blood sugar was checked and was noted to be significantly elevated. He was sent to the ED for further evaluation.  Prior to moving to [facility] care unit, patient was residing with granddaughter. He had been prediabetic with blood sugars well controlled in the 120s for a long period of time while adhering to a diabetic diet. He did not require medication for management of diabetes.  ED Course:  Initial vitals showed BP 127/59, pulse 103, RR 16, temp 98.4 Fahrenheit, SPO2 97% on room air.  Labs notable for serum glucose 779, sodium 149 (165 when  corrected for hyperglycemia), potassium 5.2, bicarb 24, BUN 50, creatinine 1.87 (1.11 on 07/15/2020), anion gap 16, AST 24, ALT 40, alk phos 98, total bilirubin 1.6, WBC 10.6, hemoglobin 15.6, platelets 174,000, ammonia <9, serum ethanol <10.  Urinalysis shows >500 glucose, negative ketones, negative protein, negative nitrites, moderate leukocytes, 11-20 RBC/hpf, 21-50 WBC/hpf, rare bacteria on microscopy.  Urine culture was obtained and pending.  SARS-CoV-2 PCR panel is collected and pending.  CT head without contrast was negative for acute intracranial abnormality.  Changes consistent with age-related atrophy and chronic small vessel ischemia noted.  Portable chest x-ray is negative for focal consolidation, edema, or effusion.  Patient was given 1.5 L normal saline and the hospitalist service was consulted to admit for further evaluation and management.  Review of Systems:  Unable to obtain full review of systems due to patient's dementia/cooperation.   Past Medical History:  Diagnosis Date  . Chronic stable angina ([place])   . 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 Depression   . Diabetes mellitus   . Essential hypertension   . Glaucoma   . Grade I diastolic dysfunction   . Hyperlipidemia with target LDL less than 70   . Memory difficulties   . 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 Pneumonia    x2  . 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 This has helped his memory issues.  . Thrombocytopenia (  [place]) 04/29/2014  . Vitamin D deficiency     Past Surgical History:  Procedure Laterality Date  . CAROTID ENDARTERECTOMY    . LEFT HEART CATHETERIZATION WITH CORONARY ANGIOGRAM N/A 02/13/2012   Procedure: LEFT HEART CATHETERIZATION WITH  CORONARY ANGIOGRAM;  Surgeon: Candee Furbish, MD;  Location: [facility] CATH LAB;  Service: Cardiovascular: Diffuse mid LAD 50%, 50%pOM1, 50%mRCA -> medical management  . TRANSTHORACIC ECHOCARDIOGRAM  01/2012   EF 55-60%. Mild LVH. No RWMA, mild Aortic Stenosis (mean/peak gradient 13 mmHg/21 mmHg)  . TRANSTHORACIC ECHOCARDIOGRAM  May 2017, May 2019   A) EF 60-65% w/o RWMA. Normal DF for age. Mod AD (mean/peak gradient 24/35 mmHg); b) EF 60-65%, mod LVH, Mod AS (Mean gradient (S): 28 mm Hg. Peak 43 mmHg  . US CAROTID DOPPLER BILATERAL ([facility] HX) Bilateral 02/05/2016   Stable mild-to-moderate disease with bilateral heterogeneous plaque. RICA - 9-45%, LICA 85-92%. Bilateral subclavian and vertebral arteries normal.    Social History:  reports that he quit smoking about 32 years ago. His smoking use included cigarettes. He has never used smokeless tobacco. He reports that he does not drink alcohol and does not use drugs.  Allergies  Allergen Reactions  . Dust Mite Extract Other (See Comments) and Cough    Sneezing, also  . Mold Extract [Trichophyton] Other (See Comments)    Sneezing, also  . Pollen Extract Other (See Comments)    Sneezing, also  . Tetracyclines & Related Other (See Comments)    Generic only (??)    Family History  Problem Relation Age of Onset  . Diabetes Mother   . Stroke Mother   . Hypertension Mother   . Hyperlipidemia Mother   . Cancer Father   . Hypertension Brother   . Cancer Other      Prior to Admission medications   Medication Sig Start Date End Date Taking? Authorizing Provider  acetaminophen (TYLENOL) 500 MG tablet Take 500 mg by mouth every 6 (six) hours as needed (for fever 99.5-101 F, minor headaches or discomfort- report any fever >101 F, severe headache or discomfort).   Yes [provider]  alum & mag hydroxide-simeth (MI-ACID) 200-200-20 MG/5ML suspension Take 30 mLs by mouth every 6 (six) hours as needed for indigestion or heartburn.   Yes [provider]  busPIRone (BUSPAR) 10 MG tablet Take 10 mg by mouth 2 (two) times daily as needed (for anxiety).   Yes [provider]  donepezil (ARICEPT) 10 MG tablet TAKE 1/2 (ONE-HALF) TABLET BY MOUTH AT BEDTIME (Needs to be seen before next refill) Patient taking differently: Take 5 mg by mouth at bedtime.  03/22/20  Yes Martin, Mary-Margaret, FNP  guaifenesin (ROBAFEN) 100 MG/5ML syrup Take 200 mg by mouth every 6 (six) hours as needed for cough.   Yes [provider]  Infant Care Products [facility]) OINT Apply 1 application topically See admin instructions. Apply topically every 3 hours- with every incontinent change   Yes [provider]  isosorbide mononitrate (IMDUR) 30 MG 24 hr tablet Take 1 tablet (30 mg total) by mouth daily. 03/22/20  Yes Martin, Mary-Margaret, FNP  lisinopril (ZESTRIL) 2.5 MG tablet Take 2 tablets (5 mg total) by mouth daily. 05/11/20  Yes Regalado, Belkys A, MD  loperamide (IMODIUM) 2 MG capsule Take 2 mg by mouth as needed (with each loose stool/diarrhea- Max of 8 doses/24 hours- REPORT ANY BLOOD).   Yes [provider]  magnesium hydroxide (MILK OF MAGNESIA) 400 MG/5ML suspension Take 30 mLs by  mouth at bedtime as needed for mild constipation.   Yes [provider]  memantine (NAMENDA) 10 MG tablet Take 1 tablet (10 mg total) by mouth 2 (two) times daily. 03/22/20  Yes Hassell Done, Mary-Margaret, FNP  Menthol-Zinc Oxide (REMEDY CALAZIME) 0.4-20.5 % PSTE Apply 1 application topically See admin instructions. Apply to the sacrum 3 times a day AND three times a day as needed after incontinence   Yes [provider]  Neomycin-Bacitracin-Polymyxin (TRIPLE ANTIBIOTIC) [phone] OINT Apply 1 application topically See admin instructions. Apply to minor skin tears or abrasions after cleansing with normal saline- cover with Band-Aids or gauze & tape   Yes [provider]  nitroGLYCERIN (NITROSTAT) 0.4 MG SL tablet Use 1  tabket under tongue at onset and may repeat every 5 minutes x2 Patient taking differently: Place 0.4 mg under the tongue every 5 (five) minutes x 3 doses as needed for chest pain (CALL MD, IF NO RELIEF).  02/15/20  Yes Hassell Done, Mary-Margaret, FNP  sertraline (ZOLOFT) 100 MG tablet Take 2 tablets (200 mg total) by mouth in the morning. 03/22/20  Yes Martin, Mary-Margaret, FNP  Travoprost, BAK Free, (TRAVATAN) 0.004 % SOLN ophthalmic solution Place 1 drop into both eyes at bedtime. 03/22/20  Yes Hassell Done, Mary-Margaret, FNP  levETIRAcetam (KEPPRA) 500 MG tablet Take 1 tablet (500 mg total) by mouth 2 (two) times daily for 7 days. Patient not taking: Reported on 07/31/2020 05/11/20 07/31/20  Regalado, Jerald Kief A, MD  nystatin (MYCOSTATIN) 100000 UNIT/ML suspension Take 5 mLs (500,000 Units total) by mouth 4 (four) times daily. Patient not taking: Reported on 07/31/2020 05/11/20   Elmarie Shiley, MD    Physical Exam: Vitals:   07/31/20 1845 07/31/20 1930 07/31/20 2000 07/31/20 2030  BP: (!) 128/52 (!) 115/54 (!) 117/43 111/77  Pulse: 98 85 81 77  Resp: (!) $RemoveB'25 20 15 14  'bDWSBqGI$ Temp:      TempSrc:      SpO2: 100% 97% 97% 97%   Exam limited due to dementia/cooperation. Constitutional: Elderly male resting in bed in the left lateral decubitus position, keeps pulling the blanket over his body Eyes: Keeps eyes closed ENMT: Unable to assess due to cooperation..  Neck: normal, supple, no masses. Respiratory: clear to auscultation bilaterally, no wheezing, no crackles. Normal respiratory effort. No accessory muscle use.  Cardiovascular: Regular rate and rhythm, no murmurs / rubs / gallops. No extremity edema. 2+ pedal pulses. Abdomen: no tenderness, no masses palpated. No hepatosplenomegaly. Musculoskeletal: Moving all extremities equally, not cooperating for exam otherwise Skin: Diaphoretic, no rashes, lesions, ulcers. No induration Neurologic: Limited due to cooperation, moves all extremities equally, sensation  appears intact.  Not participating or answering questions. Psychiatric: Awake but keeps eyes closed and keeps pulling blankets over self.  Not answering questions or cooperating with examination.  Labs on Admission: I have personally reviewed following labs and imaging studies  CBC: Recent Labs  Lab 07/31/20 1733  WBC 10.6*  NEUTROABS 7.6  HGB 15.6  HCT 50.3  MCV 90.8  PLT 774   Basic Metabolic Panel: Recent Labs  Lab 07/31/20 1733  NA 149*  K 5.2*  CL 109  CO2 24  GLUCOSE 779*  BUN 50*  CREATININE 1.87*  CALCIUM 10.0   GFR: CrCl cannot be calculated (Unknown ideal weight.). Liver Function Tests: Recent Labs  Lab 07/31/20 1733  AST 24  ALT 40  ALKPHOS 98  BILITOT 1.6*  PROT 7.1  ALBUMIN 4.0   No results for input(s): LIPASE, AMYLASE in the  last 168 hours. Recent Labs  Lab 07/31/20 1733  AMMONIA <9*   Coagulation Profile: No results for input(s): INR, PROTIME in the last 168 hours. Cardiac Enzymes: No results for input(s): CKTOTAL, CKMB, CKMBINDEX, TROPONINI in the last 168 hours. BNP (last 3 results) No results for input(s): PROBNP in the last 8760 hours. HbA1C: No results for input(s): HGBA1C in the last 72 hours. CBG: No results for input(s): GLUCAP in the last 168 hours. Lipid Profile: No results for input(s): CHOL, HDL, LDLCALC, TRIG, CHOLHDL, LDLDIRECT in the last 72 hours. Thyroid Function Tests: No results for input(s): TSH, T4TOTAL, FREET4, T3FREE, THYROIDAB in the last 72 hours. Anemia Panel: No results for input(s): VITAMINB12, FOLATE, FERRITIN, TIBC, IRON, RETICCTPCT in the last 72 hours. Urine analysis:    Component Value Date/Time   COLORURINE YELLOW 07/31/2020 1953   APPEARANCEUR HAZY (A) 07/31/2020 1953   LABSPEC 1.028 07/31/2020 1953   PHURINE 5.0 07/31/2020 1953   GLUCOSEU >=500 (A) 07/31/2020 1953   HGBUR MODERATE (A) 07/31/2020 1953   BILIRUBINUR NEGATIVE 07/31/2020 1953   KETONESUR NEGATIVE 07/31/2020 1953   PROTEINUR  NEGATIVE 07/31/2020 1953   NITRITE NEGATIVE 07/31/2020 1953   LEUKOCYTESUR MODERATE (A) 07/31/2020 1953    Radiological Exams on Admission: CT Head Wo Contrast  Result Date: 07/31/2020 CLINICAL DATA:  Change in mental status EXAM: CT HEAD WITHOUT CONTRAST TECHNIQUE: Contiguous axial images were obtained from the base of the skull through the vertex without intravenous contrast. COMPARISON:  None. FINDINGS: Brain: No evidence of acute territorial infarction, hemorrhage, hydrocephalus,extra-axial collection or mass lesion/mass effect. There is dilatation the ventricles and sulci consistent with age-related atrophy. Low-attenuation changes in the deep white matter consistent with small vessel ischemia. Vascular: No hyperdense vessel or unexpected calcification. Skull: The skull is intact. No fracture or focal lesion identified. Sinuses/Orbits: The visualized paranasal sinuses and mastoid air cells are clear. The orbits and globes intact. Other: None IMPRESSION: No acute intracranial abnormality. Findings consistent with age related atrophy and chronic small vessel ischemia Electronically Signed   By: Prudencio Pair M.D.   On: 07/31/2020 17:43   DG Chest Port 1 View  Result Date: 07/31/2020 CLINICAL DATA:  Altered mental status. EXAM: PORTABLE CHEST 1 VIEW COMPARISON:  07/15/2020 FINDINGS: The cardiac silhouette, mediastinal and hilar contours are within normal limits and stable. Stable tortuosity and calcification of the thoracic aorta. No acute pulmonary findings. No infiltrates, edema or effusions. No pulmonary lesions. The bony thorax is intact. IMPRESSION: No acute cardiopulmonary findings. Electronically Signed   By: Marijo Sanes M.D.   On: 07/31/2020 18:46    EKG: Not performed.  Assessment/Plan Principal Problem:   Type 2 diabetes mellitus with hyperosmolar hyperglycemic state (HHS) ([place]) Active Problems:   Coronary artery disease, non-occlusive   Chronic stable angina (HCC)   AKI (acute  kidney injury) ([place])   Dementia with behavioral disturbance (HCC)   Hypertension associated with diabetes ([place])   Hypernatremia   Hyperkalemia  AVIAN KONIGSBERG is a 79 y.o. male with medical history significant for CAD with chronic stable angina, type 2 diabetes, hypertension, PTSD/depression, and dementia with behavioral disturbance who is admitted with HHS in setting of type 2 diabetes.  Type 2 diabetes with hyperosmolar hyperglycemic state: Serum glucose 779 without acidosis or ketonuria. He appears dehydrated with hypernatremia and associated AKI. -Start insulin infusion per protocol -Continue IV fluids with LR$RemoveBefor'@125'ABybClPBlRqY$  mL/hour and transition to the D5 in LR when CBG <250 -Monitor BMP q4h and transition to subcutaneous insulin when  able -Check A1c  Acute kidney injury: Likely prerenal from dehydration in setting of HHS. S/p 1.5 L NS. Continue IV fluids as above and follow labs.  Hypernatremia: Corrected sodium is 165 in setting of dehydration. Continue IV fluids as above and monitor labs.  Hyperkalemia: Mild with K 5.2. Anticipate improvement with insulin and IV fluids.  Hypertension: BP currently stable transient hypotension while in the ED. Holding lisinopril due to AKI/hyperkalemia and Imdur due to hypotensive episode.  CAD with chronic stable angina: Resume Imdur when BP stable. Not on antiplatelet or statin therapy.  History of subdural hematoma: 3-4 mm right-sided subdural hematoma noted on last admission 8/26 after a fall. He did not require neurosurgical intervention. He completed 7 days of Keppra for seizure prophylaxis. Repeat CT head this admission does not show further acute hemorrhage/hematoma.  Dementia with behavioral disturbance/depression/PTSD: Resides at [place] care unit. Per patient's granddaughter, he appears to be at baseline mental status which is nonverbal and noncooperative. He has been noted to have angry/violent outburst previously. -Resume  home Aricept, Namenda, BuSpar, Zoloft -Delirium precautions  DVT prophylaxis: Subcutaneous heparin Code Status: Full code, confirmed with patient's granddaughter Family Communication: Discussed with patient's granddaughter Dean Harris by phone [phone] Disposition Plan: From [place] memory care unit and likely discharge to same environment pending improvement in HHS Consults called: None Admission status:  Status is: Observation  The patient remains OBS appropriate and will d/c before 2 midnights.  Dispo: The patient is from: [place] memory care unit              Anticipated d/c is to: [facility] memory care unit              Anticipated d/c date is: 1 day              Patient currently is not medically stable to d/c.   Dean Finders MD [facility]  If 7PM-7AM, please contact night-coverage [email or web address]  07/31/2020, 8:46 PM

## 2020-07-31 NOTE — ED Notes (Signed)
Unable to obtain 2nd IV access. IV team consulted.

## 2020-07-31 NOTE — ED Notes (Signed)
Updated patient's grand daughter with plan of care.

## 2020-08-01 DIAGNOSIS — E1159 Type 2 diabetes mellitus with other circulatory complications: Secondary | ICD-10-CM

## 2020-08-01 DIAGNOSIS — N179 Acute kidney failure, unspecified: Secondary | ICD-10-CM

## 2020-08-01 DIAGNOSIS — G309 Alzheimer's disease, unspecified: Secondary | ICD-10-CM

## 2020-08-01 DIAGNOSIS — I152 Hypertension secondary to endocrine disorders: Secondary | ICD-10-CM

## 2020-08-01 DIAGNOSIS — I2510 Atherosclerotic heart disease of native coronary artery without angina pectoris: Secondary | ICD-10-CM | POA: Diagnosis not present

## 2020-08-01 DIAGNOSIS — I208 Other forms of angina pectoris: Secondary | ICD-10-CM

## 2020-08-01 DIAGNOSIS — E870 Hyperosmolality and hypernatremia: Secondary | ICD-10-CM

## 2020-08-01 DIAGNOSIS — E875 Hyperkalemia: Secondary | ICD-10-CM

## 2020-08-01 DIAGNOSIS — E1100 Type 2 diabetes mellitus with hyperosmolarity without nonketotic hyperglycemic-hyperosmolar coma (NKHHC): Secondary | ICD-10-CM | POA: Diagnosis not present

## 2020-08-01 DIAGNOSIS — F0281 Dementia in other diseases classified elsewhere with behavioral disturbance: Secondary | ICD-10-CM

## 2020-08-01 DIAGNOSIS — L8990 Pressure ulcer of unspecified site, unspecified stage: Secondary | ICD-10-CM | POA: Insufficient documentation

## 2020-08-01 LAB — BASIC METABOLIC PANEL
Anion gap: 11 (ref 5–15)
Anion gap: 9 (ref 5–15)
Anion gap: 12 (ref 5–15)
Anion gap: 8 (ref 5–15)
Anion gap: 8 (ref 5–15)
BUN: 49 mg/dL — ABNORMAL HIGH (ref 8–23)
BUN: 47 mg/dL — ABNORMAL HIGH (ref 8–23)
BUN: 46 mg/dL — ABNORMAL HIGH (ref 8–23)
BUN: 40 mg/dL — ABNORMAL HIGH (ref 8–23)
BUN: 39 mg/dL — ABNORMAL HIGH (ref 8–23)
CO2: 24 mmol/L (ref 22–32)
CO2: 27 mmol/L (ref 22–32)
CO2: 28 mmol/L (ref 22–32)
CO2: 24 mmol/L (ref 22–32)
CO2: 27 mmol/L (ref 22–32)
Calcium: 9.1 mg/dL (ref 8.9–10.3)
Calcium: 10 mg/dL (ref 8.9–10.3)
Calcium: 10.2 mg/dL (ref 8.9–10.3)
Calcium: 9.1 mg/dL (ref 8.9–10.3)
Calcium: 9.2 mg/dL (ref 8.9–10.3)
Chloride: 115 mmol/L — ABNORMAL HIGH (ref 98–111)
Chloride: 122 mmol/L — ABNORMAL HIGH (ref 98–111)
Chloride: 118 mmol/L — ABNORMAL HIGH (ref 98–111)
Chloride: 121 mmol/L — ABNORMAL HIGH (ref 98–111)
Chloride: 117 mmol/L — ABNORMAL HIGH (ref 98–111)
Creatinine, Ser: 1.73 mg/dL — ABNORMAL HIGH (ref 0.61–1.24)
Creatinine, Ser: 1.62 mg/dL — ABNORMAL HIGH (ref 0.61–1.24)
Creatinine, Ser: 1.46 mg/dL — ABNORMAL HIGH (ref 0.61–1.24)
Creatinine, Ser: 1.23 mg/dL (ref 0.61–1.24)
Creatinine, Ser: 1.23 mg/dL (ref 0.61–1.24)
GFR, Estimated: 40 mL/min — ABNORMAL LOW (ref >60)
GFR, Estimated: 43 mL/min — ABNORMAL LOW (ref >60)
GFR, Estimated: 49 mL/min — ABNORMAL LOW (ref >60)
GFR, Estimated: >60 mL/min (ref >60)
GFR, Estimated: >60 mL/min (ref >60)
Glucose, Bld: 385 mg/dL — ABNORMAL HIGH (ref 70–99)
Glucose, Bld: 111 mg/dL — ABNORMAL HIGH (ref 70–99)
Glucose, Bld: 93 mg/dL (ref 70–99)
Glucose, Bld: 144 mg/dL — ABNORMAL HIGH (ref 70–99)
Glucose, Bld: 178 mg/dL — ABNORMAL HIGH (ref 70–99)
Potassium: 3.1 mmol/L — ABNORMAL LOW (ref 3.5–5.1)
Potassium: 3.5 mmol/L (ref 3.5–5.1)
Potassium: 3.5 mmol/L (ref 3.5–5.1)
Potassium: 3.8 mmol/L (ref 3.5–5.1)
Potassium: 3.8 mmol/L (ref 3.5–5.1)
Sodium: 150 mmol/L — ABNORMAL HIGH (ref 135–145)
Sodium: 158 mmol/L — ABNORMAL HIGH (ref 135–145)
Sodium: 158 mmol/L — ABNORMAL HIGH (ref 135–145)
Sodium: 153 mmol/L — ABNORMAL HIGH (ref 135–145)
Sodium: 152 mmol/L — ABNORMAL HIGH (ref 135–145)

## 2020-08-01 LAB — CBG MONITORING, ED
Glucose-Capillary: 103 mg/dL — ABNORMAL HIGH (ref 70–99)
Glucose-Capillary: 111 mg/dL — ABNORMAL HIGH (ref 70–99)
Glucose-Capillary: 123 mg/dL — ABNORMAL HIGH (ref 70–99)
Glucose-Capillary: 134 mg/dL — ABNORMAL HIGH (ref 70–99)
Glucose-Capillary: 135 mg/dL — ABNORMAL HIGH (ref 70–99)
Glucose-Capillary: 135 mg/dL — ABNORMAL HIGH (ref 70–99)
Glucose-Capillary: 154 mg/dL — ABNORMAL HIGH (ref 70–99)
Glucose-Capillary: 223 mg/dL — ABNORMAL HIGH (ref 70–99)
Glucose-Capillary: 73 mg/dL (ref 70–99)

## 2020-08-01 LAB — OSMOLALITY: Osmolality: 348 mOsm/kg (ref 275–295)

## 2020-08-01 LAB — CBC
HCT: 50.8 % (ref 39.0–52.0)
Hemoglobin: 15.8 g/dL (ref 13.0–17.0)
MCH: 28.2 pg (ref 26.0–34.0)
MCHC: 31.1 g/dL (ref 30.0–36.0)
MCV: 90.6 fL (ref 80.0–100.0)
Platelets: 174 10*3/uL (ref 150–400)
RBC: 5.61 MIL/uL (ref 4.22–5.81)
RDW: 14.4 % (ref 11.5–15.5)
WBC: 10.6 10*3/uL — ABNORMAL HIGH (ref 4.0–10.5)
nRBC: 0 % (ref 0.0–0.2)

## 2020-08-01 LAB — I-STAT CHEM 8, ED
BUN: 47 mg/dL — ABNORMAL HIGH (ref 8–23)
Calcium, Ion: 1.33 mmol/L (ref 1.15–1.40)
Chloride: 124 mmol/L — ABNORMAL HIGH (ref 98–111)
Creatinine, Ser: 1.3 mg/dL — ABNORMAL HIGH (ref 0.61–1.24)
Glucose, Bld: 106 mg/dL — ABNORMAL HIGH (ref 70–99)
HCT: 48 % (ref 39.0–52.0)
Hemoglobin: 16.3 g/dL (ref 13.0–17.0)
Potassium: 4 mmol/L (ref 3.5–5.1)
Sodium: 162 mmol/L (ref 135–145)
TCO2: 25 mmol/L (ref 22–32)

## 2020-08-01 LAB — HEMOGLOBIN A1C
Hgb A1c MFr Bld: 11.6 % — ABNORMAL HIGH (ref 4.8–5.6)
Mean Plasma Glucose: 286.22 mg/dL

## 2020-08-01 LAB — GLUCOSE, CAPILLARY
Glucose-Capillary: 80 mg/dL (ref 70–99)
Glucose-Capillary: 107 mg/dL — ABNORMAL HIGH (ref 70–99)
Glucose-Capillary: 134 mg/dL — ABNORMAL HIGH (ref 70–99)
Glucose-Capillary: 180 mg/dL — ABNORMAL HIGH (ref 70–99)

## 2020-08-01 LAB — MRSA PCR SCREENING: MRSA by PCR: NEGATIVE

## 2020-08-01 LAB — MAGNESIUM: Magnesium: 2.6 mg/dL — ABNORMAL HIGH (ref 1.7–2.4)

## 2020-08-01 MED ORDER — INSULIN ASPART 100 UNIT/ML ~~LOC~~ SOLN
0.0000 [IU] | Freq: Three times a day (TID) | SUBCUTANEOUS | Status: DC
  Administered 2020-08-01 (×2): 1 [IU] via SUBCUTANEOUS
  Administered 2020-08-02 (×3): 2 [IU] via SUBCUTANEOUS
  Administered 2020-08-03: 3 [IU] via SUBCUTANEOUS
  Administered 2020-08-04: 1 [IU] via SUBCUTANEOUS
  Administered 2020-08-04: 2 [IU] via SUBCUTANEOUS
  Administered 2020-08-04: 5 [IU] via SUBCUTANEOUS
  Administered 2020-08-05: 1 [IU] via SUBCUTANEOUS
  Administered 2020-08-05: 5 [IU] via SUBCUTANEOUS

## 2020-08-01 MED ORDER — SODIUM CHLORIDE 0.9 % IV BOLUS
1000.0000 mL | Freq: Once | INTRAVENOUS | Status: AC
  Administered 2020-08-01: 1000 mL via INTRAVENOUS

## 2020-08-01 MED ORDER — POTASSIUM CHLORIDE 10 MEQ/100ML IV SOLN
10.0000 meq | INTRAVENOUS | Status: DC
  Administered 2020-08-01: 10 meq via INTRAVENOUS
  Filled 2020-08-01: qty 100

## 2020-08-01 MED ORDER — HYDRALAZINE HCL 20 MG/ML IJ SOLN: 10.0000 mg | Freq: Four times a day (QID) | INTRAMUSCULAR | Status: DC | PRN

## 2020-08-01 MED ORDER — DEXTROSE 5 % IV SOLN: INTRAVENOUS | Status: AC

## 2020-08-01 MED ORDER — ALUM & MAG HYDROXIDE-SIMETH 200-200-20 MG/5ML PO SUSP: 30.0000 mL | Freq: Four times a day (QID) | ORAL | Status: DC | PRN

## 2020-08-01 MED ORDER — INSULIN ASPART 100 UNIT/ML ~~LOC~~ SOLN: 0.0000 [IU] | Freq: Every day | SUBCUTANEOUS | Status: DC

## 2020-08-01 MED ORDER — ISOSORBIDE MONONITRATE ER 30 MG PO TB24
30.0000 mg | ORAL_TABLET | Freq: Every day | ORAL | Status: DC
  Administered 2020-08-02 – 2020-08-05 (×3): 30 mg via ORAL
  Filled 2020-08-01 (×4): qty 1

## 2020-08-01 MED ORDER — INSULIN GLARGINE 100 UNIT/ML ~~LOC~~ SOLN
10.0000 [IU] | SUBCUTANEOUS | Status: DC
  Administered 2020-08-01 – 2020-08-05 (×5): 10 [IU] via SUBCUTANEOUS
  Filled 2020-08-01 (×5): qty 0.1

## 2020-08-01 NOTE — Progress Notes (Signed)
Inpatient Diabetes Program Recommendations  AACE/ADA: New Consensus Statement on Inpatient Glycemic Control (2015)  Target Ranges:  Prepandial:   less than 140 mg/dL      Peak postprandial:   less than 180 mg/dL (1-2 hours)      Critically ill patients:  140 - 180 mg/dL   Lab Results  Component Value Date   GLUCAP 135 (H) 08/01/2020   HGBA1C 11.6 (H) 08/01/2020    Review of Glycemic Control  Diabetes history: DM2 Outpatient Diabetes medications: None Current orders for Inpatient glycemic control: Lantus 10 units + Novolog sensitive tid correction + hs 0-5 units  Inpatient Diabetes Program Recommendations:   Received consult. Agree with current insulin regimen. Will follow during hospitalization.  Thank you, Billy Fischer. Noam Franzen, RN, MSN, CDE  Diabetes Coordinator Inpatient Glycemic Control Team Team Pager [phone] (8am-5pm) 08/01/2020 9:47 AM

## 2020-08-01 NOTE — ED Notes (Signed)
Staffing called for sitter. None available at this time. 

## 2020-08-01 NOTE — Progress Notes (Signed)
PROGRESS NOTE  Dean Harris:[id number] DOB: 06/11/1941 DOA: 07/31/2020 PCP: Chevis Pretty, FNP   LOS: 0 days   Brief narrative:  Dean Harris is a 79 y.o. male with medical history significant for CAD with chronic stable angina, type 2 diabetes mellitus, hypertension, PTSD/depression, and dementia with behavioral disturbances who presented to the ED from [facility] care unit for evaluation of lethargy and hyperglycemia. Patient was unable to provide history due to baseline dementia. Granddaughter stated that at baseline, patient has dementia and is essentially nonverbal. He is minimally interactive and will occasionally have mumbled speech. He has been noted to have angry and violent behavior. He has been residing at the [facility] care unit for the last 4 months. INR is husband went to see him today and noticed that he seemed to be more lethargic than usual. He is reported to have been recently treated for UTI with ciprofloxacin from 10/29 >> 11/5. [facility] staff reported that he was having shouting outbursts over the weekend. He has been noted to have increased urinary frequency. Blood sugar was checked and was noted to be significantly elevated. He was sent to the ED for further evaluation.  In the ED, his labs were notable for severe hyperglycemia with serum glucose level of  779, sodium 149 (165 when corrected for hyperglycemia), potassium 5.2, bicarb 24, BUN 50, creatinine 1.87 (1.11 on 07/15/2020), anion gap 16, AST 24, ALT 40, alk phos 98, total bilirubin 1.6, WBC 10.6, hemoglobin 15.6, platelets 174,000, ammonia <9, serum ethanol <10.  Urinalysis showed negative ketones but glycosuria with 21-50 white cells.CT head without contrast was negative for acute intracranial abnormality.  Changes consistent with age-related atrophy and chronic small vessel ischemia noted.  Portable chest x-ray was negative for focal consolidation, edema, or effusion.  Patient was given 1.5 L normal saline and the hospitalist service was consulted to admit for further evaluation and management.   Assessment/Plan:  Principal Problem:   Type 2 diabetes mellitus with hyperosmolar hyperglycemic state (HHS) ([place]) Active Problems:   Coronary artery disease, non-occlusive   Chronic stable angina (HCC)   AKI (acute kidney injury) ([place])   Dementia with behavioral disturbance (HCC)   Hypertension associated with diabetes ([place])   Hypernatremia   Hyperkalemia   Pressure injury of skin  Type 2 diabetes mellitus with hyperosmolar hyperglycemic state: Patient recent serum glucose was 779 without acidosis or ketonuria.  Patient was started on IV insulin drip and fluids.  Patient still appeared to be volume depleted this morning so will give 1 L of normal saline again.  Followed by D5 secondary to hypernatremia.  Continue insulin long-acting and sliding scale insulin at this time since her blood glucose levels have come down.  Check BMP in a.m.  Latest sodium of 152.  Acute kidney injury: Likely secondary to volume depletion.  Creatinine of 1.2 at this time.  This has slightly improved.  Continue with IV fluid hydration.  Hypernatremia: Corrected sodium presentation was 165 in setting of dehydration.  Currently sodium level of 152.  Continue with D5 water for now.  Hyperkalemia: Improved after insulin and fluid administration.  Latest potassium of 3.8.  Essential hypertension: Patient was on lisinopril at home.  Lisinopril currently on hold due to AKI and volume depletion.  We will add hydralazine as needed for antihypertensive effect for now.Marland Kitchen  CAD with chronic stable angina: Will resume Imdur.  Not on antiplatelet or statin at home.  History of subdural hematoma: 3-4 mm  right-sided subdural hematoma noted on last admission 8/26 after a fall. He did not require neurosurgical intervention. He completed 7 days of Keppra for seizure prophylaxis.  PT CT this  admission without any hemorrhage or hematoma.  Dementia with behavioral disturbance/depression/PTSD: Currently at [facility] care unit.  At baseline, patient is nonverbal and noncooperative with some behavioral outbursts. Resume home Aricept, Namenda, BuSpar, Zoloft.  DVT prophylaxis: heparin injection 5,000 Units Start: 07/31/20 2200   Code Status: Full code  Family Communication: I spoke with the patient's granddaughter and updated her about the clinical condition of the patient.   Status is: Observation  The patient will require care spanning > 2 midnights and should be moved to inpatient because: IV treatments appropriate due to intensity of illness or inability to take PO and Inpatient level of care appropriate due to severity of illness  Dispo: The patient is from: [facility] assisted living facility.              Anticipated d/c is to: Assisted living facility              Anticipated d/c date is: 2 days              Patient currently is not medically stable to d/c.  Consultants:  None  Procedures:  None  Antibiotics:  . None  Anti-infectives (From admission, onward)   None     Subjective: Today, patient was seen and examined at bedside.  Patient is nonverbal.  Has dementia.  Appears to be dehydrated.  Objective: Vitals:   08/01/20 1029 08/01/20 1141  BP: (!) 146/46 (!) 127/45  Pulse: 65 65  Resp: 14 15  Temp: (!) 97.4 F (36.3 C) 98.5 F (36.9 C)  SpO2:  93%    Intake/Output Summary (Last 24 hours) at 08/01/2020 1522 Last data filed at 08/01/2020 0947 Gross per 24 hour  Intake 3032.05 ml  Output --  Net 3032.05 ml   There were no vitals filed for this visit. There is no height or weight on file to calculate BMI.   Physical Exam: GENERAL: Patient is possibly nonverbal,. Not in obvious distress.  Underlying dementia. HENT: No scleral pallor or icterus. Pupils equally reactive to light. Oral mucosa is dry NECK: is supple, no gross  swelling noted. CHEST: Clear to auscultation. No crackles or wheezes.  Diminished breath sounds bilaterally. CVS: S1 and S2 heard, no murmur. Regular rate and rhythm.  ABDOMEN: Soft, non-tender, bowel sounds are present. EXTREMITIES: No edema. CNS: Moving extremities.  Mostly nonverbal. SKIN: warm and dry with decreased skin turgor  Data Review: I have personally reviewed the following laboratory data and studies,  CBC: Recent Labs  Lab 07/31/20 1733 08/01/20 0430 08/01/20 0443  WBC 10.6* 10.6*  --   NEUTROABS 7.6  --   --   HGB 15.6 15.8 16.3  HCT 50.3 50.8 48.0  MCV 90.8 90.6  --   PLT 174 174  --    Basic Metabolic Panel: Recent Labs  Lab 07/31/20 1733 07/31/20 1733 07/31/20 2321 08/01/20 0108 08/01/20 0430 08/01/20 0443 08/01/20 1242  NA 149*   < > 150* 158* 158* 162* 153*  K 5.2*   < > 3.1* 3.5 3.5 4.0 3.8  CL 109   < > 115* 122* 118* 124* 121*  CO2 24  --  _0 --  24  GLUCOSE 779*   < > 385* 111* 93 106* 144*  BUN 50*   < >  49* 47* 46* 47* 40*  CREATININE 1.87*   < > 1.73* 1.62* 1.46* 1.30* 1.23  CALCIUM 10.0  --  9.1 10.0 10.2  --  9.1  MG  --   --   --   --  2.6*  --   --    < > = values in this interval not displayed.   Liver Function Tests: Recent Labs  Lab 07/31/20 1733  AST 24  ALT 40  ALKPHOS 98  BILITOT 1.6*  PROT 7.1  ALBUMIN 4.0   No results for input(s): LIPASE, AMYLASE in the last 168 hours. Recent Labs  Lab 07/31/20 1733  AMMONIA <9*   Cardiac Enzymes: No results for input(s): CKTOTAL, CKMB, CKMBINDEX, TROPONINI in the last 168 hours. BNP (last 3 results) No results for input(s): BNP in the last 8760 hours.  ProBNP (last 3 results) No results for input(s): PROBNP in the last 8760 hours.  CBG: Recent Labs  Lab 08/01/20 0626 08/01/20 0731 08/01/20 0952 08/01/20 1025 08/01/20 1140  GLUCAP 134* 135* 73 80 107*   Recent Results (from the past 240 hour(s))  Respiratory Panel by RT PCR (Flu A&B, Covid) - Nasopharyngeal  Swab     Status: None   Collection Time: 07/31/20  7:21 PM   Specimen: Nasopharyngeal Swab  Result Value Ref Range Status   SARS Coronavirus 2 by RT PCR NEGATIVE NEGATIVE Final    Comment: (NOTE) SARS-CoV-2 target nucleic acids are NOT DETECTED.  The SARS-CoV-2 RNA is generally detectable in upper respiratoy specimens during the acute phase of infection. The lowest concentration of SARS-CoV-2 viral copies this assay can detect is 131 copies/mL. A negative result does not preclude SARS-Cov-2 infection and should not be used as the sole basis for treatment or other patient management decisions. A negative result may occur with  improper specimen collection/handling, submission of specimen other than nasopharyngeal swab, presence of viral mutation(s) within the areas targeted by this assay, and inadequate number of viral copies (<131 copies/mL). A negative result must be combined with clinical observations, patient history, and epidemiological information. The expected result is Negative.  Fact Sheet for Patients:  PinkCheek.be  Fact Sheet for Healthcare Providers:  GravelBags.it  This test is no t yet approved or cleared by the [place] FDA and  has been authorized for detection and/or diagnosis of SARS-CoV-2 by FDA under an Emergency Use Authorization (EUA). This EUA will remain  in effect (meaning this test can be used) for the duration of the COVID-19 declaration under Section 564(b)(1) of the Act, 21 U.S.C. section 360bbb-3(b)(1), unless the authorization is terminated or revoked sooner.     Influenza A by PCR NEGATIVE NEGATIVE Final   Influenza B by PCR NEGATIVE NEGATIVE Final    Comment: (NOTE) The Xpert Xpress SARS-CoV-2/FLU/RSV assay is intended as an aid in  the diagnosis of influenza from Nasopharyngeal swab specimens and  should not be used as a sole basis for treatment. Nasal washings and  aspirates are  unacceptable for Xpert Xpress SARS-CoV-2/FLU/RSV  testing.  Fact Sheet for Patients: PinkCheek.be  Fact Sheet for Healthcare Providers: GravelBags.it  This test is not yet approved or cleared by the [place] FDA and  has been authorized for detection and/or diagnosis of SARS-CoV-2 by  FDA under an Emergency Use Authorization (EUA). This EUA will remain  in effect (meaning this test can be used) for the duration of the  Covid-19 declaration under Section 564(b)(1) of the Act, 21  U.S.C. section 360bbb-3(b)(1), unless  the authorization is  terminated or revoked. Performed at [facility], [place] [address] [place], [place] [address]   MRSA PCR Screening     Status: None   Collection Time: 08/01/20 10:32 AM   Specimen: Nasal Mucosa; Nasopharyngeal  Result Value Ref Range Status   MRSA by PCR NEGATIVE NEGATIVE Final    Comment:        The GeneXpert MRSA Assay (FDA approved for NASAL specimens only), is one component of a comprehensive MRSA colonization surveillance program. It is not intended to diagnose MRSA infection nor to guide or monitor treatment for MRSA infections. Performed at [facility], [place] [address] [place], [place] [address]      Studies: CT Head Wo Contrast  Result Date: 07/31/2020 CLINICAL DATA:  Change in mental status EXAM: CT HEAD WITHOUT CONTRAST TECHNIQUE: Contiguous axial images were obtained from the base of the skull through the vertex without intravenous contrast. COMPARISON:  None. FINDINGS: Brain: No evidence of acute territorial infarction, hemorrhage, hydrocephalus,extra-axial collection or mass lesion/mass effect. There is dilatation the ventricles and sulci consistent with age-related atrophy. Low-attenuation changes in the deep white matter consistent with small vessel ischemia. Vascular: No hyperdense vessel or unexpected calcification. Skull: The skull is intact. No  fracture or focal lesion identified. Sinuses/Orbits: The visualized paranasal sinuses and mastoid air cells are clear. The orbits and globes intact. Other: None IMPRESSION: No acute intracranial abnormality. Findings consistent with age related atrophy and chronic small vessel ischemia Electronically Signed   By: Prudencio Pair M.D.   On: 07/31/2020 17:43   DG Chest Port 1 View  Result Date: 07/31/2020 CLINICAL DATA:  Altered mental status. EXAM: PORTABLE CHEST 1 VIEW COMPARISON:  07/15/2020 FINDINGS: The cardiac silhouette, mediastinal and hilar contours are within normal limits and stable. Stable tortuosity and calcification of the thoracic aorta. No acute pulmonary findings. No infiltrates, edema or effusions. No pulmonary lesions. The bony thorax is intact. IMPRESSION: No acute cardiopulmonary findings. Electronically Signed   By: Marijo Sanes M.D.   On: 07/31/2020 18:46     Flora Lipps, MD  [facility] 08/01/2020

## 2020-08-01 NOTE — ED Notes (Signed)
MD Opyd aware of 162 Na via I-stat

## 2020-08-01 NOTE — ED Notes (Signed)
Vital Signs stable

## 2020-08-01 NOTE — TOC Initial Note (Signed)
Transition of Care [facility]) - Initial/Assessment Note    Patient Details  Name: Dean Harris MRN: [id number] Date of Birth: March 27, 1941  [facility] CM/SW Contact:    Eduard Roux, LCSWA Phone Number: 08/01/2020, 2:24 PM  Clinical Narrative:                  CSW spoke with patient's granddaughter,Ashley. CSW introduced self and explained role. [name] states patient has ben at the [facility] ALF/Memory unit for about 4 months and expects the patient will return at discharge. No questions or concern noted at this time.   CSW wil continue to follow and assist with discharge planning.  Antony Blackbird, MSW, LCSWA Clinical Social Worker   Expected Discharge Plan: Assisted Living Barriers to Discharge: Continued Medical Work up   Patient Goals and CMS Choice        Expected Discharge Plan and Services Expected Discharge Plan: Assisted Living In-house Referral: Clinical Social Work     Living arrangements for the past 2 months: Assisted Living Facility ([facility])                                      Prior Living Arrangements/Services Living arrangements for the past 2 months: Assisted Living Facility ([facility]) Lives with:: Self, Facility Resident Patient language and need for interpreter reviewed:: No        Need for Family Participation in Patient Care: Yes (Comment) Care giver support system in place?: Yes (comment)   Criminal Activity/Legal Involvement Pertinent to Current Situation/Hospitalization: No - Comment as needed  Activities of Daily Living      Permission Sought/Granted Permission sought to share information with : Family Supports    Share Information with NAME: Truddie Coco     Permission granted to share info w Relationship: granddaughter  Permission granted to share info w Contact Information: [phone]  Emotional Assessment   Attitude/Demeanor/Rapport: Unable to Assess Affect (typically  observed): Unable to Assess Orientation: : Oriented to Self Alcohol / Substance Use: Not Applicable Psych Involvement: No (comment)  Admission diagnosis:  Dehydration [E86.0] Hyperglycemia [R73.9] AKI (acute kidney injury) (HCC) [N17.9] Type 2 diabetes mellitus with hyperosmolar hyperglycemic state (HHS) (HCC) [E11.00, E11.65] Patient Active Problem List   Diagnosis Date Noted  . Type 2 diabetes mellitus with hyperosmolar hyperglycemic state (HHS) (HCC) 07/31/2020  . Hypertension associated with diabetes (HCC) 07/31/2020  . Hypernatremia 07/31/2020  . Hyperkalemia 07/31/2020  . Acute subdural hematoma (HCC) 05/10/2020  . Hyperlipidemia with target LDL less than 70   . Glaucoma   . Grade I diastolic dysfunction   . AKI (acute kidney injury) (HCC)   . Dementia with behavioral disturbance (HCC)   . AMS (altered mental status) 01/11/2020  . CAP (community acquired pneumonia) 01/10/2020  . Weight loss, non-intentional 05/20/2019  . Depression, recurrent (HCC) 12/01/2017  . Memory disorder 12/01/2017  . Carotid artery plaque, bilateral; s/p Bilateral CEA 07/24/2016  . Syncope 01/28/2016  . Moderate aortic stenosis 01/28/2016  . Chronic stable angina (HCC) 01/28/2016  . BMI 30.0-30.9,adult 07/09/2015  . Thrombocytopenia (HCC) 04/29/2014  . Essential hypertension, benign 03/03/2014  . Coronary artery disease, non-occlusive   . Sleep apnea 11/30/2010  . Gout 11/30/2010  . Hyperlipidemia associated with type 2 diabetes mellitus (HCC) 11/30/2010  . Rosacea 11/30/2010  . COPD (chronic obstructive pulmonary disease) (HCC) 11/30/2010  . ED (erectile dysfunction) 11/30/2010  .  Peripheral neuropathy 11/30/2010  . Type 2 diabetes mellitus (HCC) 09/16/1999   PCP:  Bennie Pierini, FNP Pharmacy:  No Pharmacies Listed    Social Determinants of Health (SDOH) Interventions    Readmission Risk Interventions No flowsheet data found.

## 2020-08-01 NOTE — ED Notes (Signed)
Dr. Antionette Char made aware of K 3.1 and pending chemistry. Will place orders. Will continue to monitor.

## 2020-08-01 NOTE — ED Notes (Signed)
Ordered breakfast 

## 2020-08-01 NOTE — ED Notes (Signed)
Nurse attempted to adjust Pulse 0x. PT due to AMS continues to pull of lines. Tech Inetta Fermo also attempted to get 02 several times before pt pulled off lines to no success. Nurse will increase rounding and inquire about sitters. Nurse also attempted to feed patient but pt would grunt and say "no". Pt depends changed.

## 2020-08-01 NOTE — Progress Notes (Signed)
Patient arrived to unit from ED. Vital signs WDL. CHG bath and MRSA swab complete. Patient resting in bed, call bell within reach, and bed alarm on. Floor mats placed on both sides of bed.

## 2020-08-01 NOTE — ED Notes (Signed)
Called pharm again about Lantus. Per Vernona Rieger will tube now.

## 2020-08-01 NOTE — ED Notes (Signed)
Attempted report x1. 

## 2020-08-01 NOTE — ED Notes (Signed)
Lunch Tray Orders @ 1007. °

## 2020-08-01 NOTE — ED Notes (Signed)
Pt off endotool/insulin drip post 2 hours after basal dose. MD Pokhrel paged to ascertain whether to continue D5 fluids or switch. Per MD pokhrel nurse to give bolus of NS followed by D5 drip.

## 2020-08-01 NOTE — Progress Notes (Signed)
This RN attempted to give pt oral medications. Pt wouldn't open his mouth. This RN crushed medications in applesauce. Pt pocketed medication in mouth and spit it back out.

## 2020-08-01 NOTE — ED Notes (Addendum)
Called pharm. After sending message requesting Lantus.Lantus still not received and administration time had changed.  Per Vernona Rieger in [place]. They are working on sending it now and had just changed the time for pt comfort since it's daily. Nurse to recheck tube systems.

## 2020-08-02 DIAGNOSIS — Z83438 Family history of other disorder of lipoprotein metabolism and other lipidemia: Secondary | ICD-10-CM | POA: Diagnosis not present

## 2020-08-02 DIAGNOSIS — S065X9S Traumatic subdural hemorrhage with loss of consciousness of unspecified duration, sequela: Secondary | ICD-10-CM | POA: Diagnosis not present

## 2020-08-02 DIAGNOSIS — R456 Violent behavior: Secondary | ICD-10-CM | POA: Diagnosis present

## 2020-08-02 DIAGNOSIS — Z823 Family history of stroke: Secondary | ICD-10-CM | POA: Diagnosis not present

## 2020-08-02 DIAGNOSIS — Z8249 Family history of ischemic heart disease and other diseases of the circulatory system: Secondary | ICD-10-CM | POA: Diagnosis not present

## 2020-08-02 DIAGNOSIS — E1169 Type 2 diabetes mellitus with other specified complication: Secondary | ICD-10-CM | POA: Diagnosis present

## 2020-08-02 DIAGNOSIS — I2510 Atherosclerotic heart disease of native coronary artery without angina pectoris: Secondary | ICD-10-CM | POA: Diagnosis not present

## 2020-08-02 DIAGNOSIS — E1165 Type 2 diabetes mellitus with hyperglycemia: Secondary | ICD-10-CM | POA: Diagnosis present

## 2020-08-02 DIAGNOSIS — F32A Depression, unspecified: Secondary | ICD-10-CM | POA: Diagnosis present

## 2020-08-02 DIAGNOSIS — W19XXXS Unspecified fall, sequela: Secondary | ICD-10-CM | POA: Diagnosis present

## 2020-08-02 DIAGNOSIS — I208 Other forms of angina pectoris: Secondary | ICD-10-CM | POA: Diagnosis not present

## 2020-08-02 DIAGNOSIS — Z87440 Personal history of urinary (tract) infections: Secondary | ICD-10-CM | POA: Diagnosis not present

## 2020-08-02 DIAGNOSIS — E1100 Type 2 diabetes mellitus with hyperosmolarity without nonketotic hyperglycemic-hyperosmolar coma (NKHHC): Secondary | ICD-10-CM | POA: Diagnosis present

## 2020-08-02 DIAGNOSIS — N179 Acute kidney failure, unspecified: Secondary | ICD-10-CM | POA: Diagnosis present

## 2020-08-02 DIAGNOSIS — E785 Hyperlipidemia, unspecified: Secondary | ICD-10-CM | POA: Diagnosis present

## 2020-08-02 DIAGNOSIS — E1142 Type 2 diabetes mellitus with diabetic polyneuropathy: Secondary | ICD-10-CM | POA: Diagnosis present

## 2020-08-02 DIAGNOSIS — F4310 Post-traumatic stress disorder, unspecified: Secondary | ICD-10-CM | POA: Diagnosis present

## 2020-08-02 DIAGNOSIS — E860 Dehydration: Secondary | ICD-10-CM | POA: Diagnosis present

## 2020-08-02 DIAGNOSIS — E875 Hyperkalemia: Secondary | ICD-10-CM | POA: Diagnosis present

## 2020-08-02 DIAGNOSIS — I152 Hypertension secondary to endocrine disorders: Secondary | ICD-10-CM | POA: Diagnosis present

## 2020-08-02 DIAGNOSIS — J449 Chronic obstructive pulmonary disease, unspecified: Secondary | ICD-10-CM | POA: Diagnosis present

## 2020-08-02 DIAGNOSIS — I25118 Atherosclerotic heart disease of native coronary artery with other forms of angina pectoris: Secondary | ICD-10-CM | POA: Diagnosis present

## 2020-08-02 DIAGNOSIS — H409 Unspecified glaucoma: Secondary | ICD-10-CM | POA: Diagnosis present

## 2020-08-02 DIAGNOSIS — Z20822 Contact with and (suspected) exposure to covid-19: Secondary | ICD-10-CM | POA: Diagnosis present

## 2020-08-02 DIAGNOSIS — Z833 Family history of diabetes mellitus: Secondary | ICD-10-CM | POA: Diagnosis not present

## 2020-08-02 DIAGNOSIS — F0391 Unspecified dementia with behavioral disturbance: Secondary | ICD-10-CM | POA: Diagnosis present

## 2020-08-02 DIAGNOSIS — E870 Hyperosmolality and hypernatremia: Secondary | ICD-10-CM | POA: Diagnosis present

## 2020-08-02 DIAGNOSIS — L89151 Pressure ulcer of sacral region, stage 1: Secondary | ICD-10-CM | POA: Diagnosis present

## 2020-08-02 LAB — COMPREHENSIVE METABOLIC PANEL
ALT: 25 U/L (ref 0–44)
AST: 27 U/L (ref 15–41)
Albumin: 2.8 g/dL — ABNORMAL LOW (ref 3.5–5.0)
Alkaline Phosphatase: 65 U/L (ref 38–126)
Anion gap: 14 (ref 5–15)
BUN: 36 mg/dL — ABNORMAL HIGH (ref 8–23)
CO2: 18 mmol/L — ABNORMAL LOW (ref 22–32)
Calcium: 8.8 mg/dL — ABNORMAL LOW (ref 8.9–10.3)
Chloride: 119 mmol/L — ABNORMAL HIGH (ref 98–111)
Creatinine, Ser: 1.12 mg/dL (ref 0.61–1.24)
GFR, Estimated: >60 mL/min (ref >60)
Glucose, Bld: 197 mg/dL — ABNORMAL HIGH (ref 70–99)
Potassium: 3.9 mmol/L (ref 3.5–5.1)
Sodium: 151 mmol/L — ABNORMAL HIGH (ref 135–145)
Total Bilirubin: 1.3 mg/dL — ABNORMAL HIGH (ref 0.3–1.2)
Total Protein: 5.3 g/dL — ABNORMAL LOW (ref 6.5–8.1)

## 2020-08-02 LAB — CBC
HCT: 41.8 % (ref 39.0–52.0)
Hemoglobin: 13.4 g/dL (ref 13.0–17.0)
MCH: 28.1 pg (ref 26.0–34.0)
MCHC: 32.1 g/dL (ref 30.0–36.0)
MCV: 87.6 fL (ref 80.0–100.0)
Platelets: 112 10*3/uL — ABNORMAL LOW (ref 150–400)
RBC: 4.77 MIL/uL (ref 4.22–5.81)
RDW: 14.4 % (ref 11.5–15.5)
WBC: 7.5 10*3/uL (ref 4.0–10.5)
nRBC: 0 % (ref 0.0–0.2)

## 2020-08-02 LAB — GLUCOSE, CAPILLARY
Glucose-Capillary: 171 mg/dL — ABNORMAL HIGH (ref 70–99)
Glucose-Capillary: 197 mg/dL — ABNORMAL HIGH (ref 70–99)
Glucose-Capillary: 192 mg/dL — ABNORMAL HIGH (ref 70–99)
Glucose-Capillary: 154 mg/dL — ABNORMAL HIGH (ref 70–99)

## 2020-08-02 LAB — PHOSPHORUS: Phosphorus: 3.7 mg/dL (ref 2.5–4.6)

## 2020-08-02 LAB — MAGNESIUM: Magnesium: 2.1 mg/dL (ref 1.7–2.4)

## 2020-08-02 NOTE — Progress Notes (Addendum)
PROGRESS NOTE  Dean Harris RJJ:[id number] DOB: [birth year] DOA: 07/31/2020 PCP: Chevis Pretty, FNP   LOS: 0 days   Brief narrative:  Dean Harris is a 79 y.o. male with medical history significant for chronic stable angina, type 2 diabetes mellitus, hypertension, PTSD/depression, and dementia with behavioral disturbances presented to the ED from [place] care unit for evaluation of lethargy and hyperglycemia. Patient was unable to provide history due to baseline dementia. Granddaughter stated that at baseline, patient has dementia and is essentially nonverbal. He is minimally interactive and will occasionally have mumbled speech. He has been noted to have angry and violent behavior. He has been residing at the [facility] care unit for the last 4 months. Patient was reported to have been recently treated for UTI with ciprofloxacin from 10/29 >> 11/5. [facility] staff reported that he was having shouting outbursts over the weekend. He has been noted to have increased urinary frequency. Blood sugar was checked and was noted to be significantly elevated. He was sent to the ED for further evaluation.   In the ED, his labs were notable for severe hyperglycemia with serum glucose level of   779, sodium 149 (165 when corrected for hyperglycemia), potassium 5.2, bicarb 24, BUN 50, creatinine 1.87 (1.11 on 07/15/2020), anion gap 16, AST 24, ALT 40, alk phos 98, total bilirubin 1.6, WBC 10.6, hemoglobin 15.6, platelets 174,000, ammonia <9, serum ethanol <10.  Urinalysis showed negative ketones but glycosuria with 21-50 white cells.CT head without contrast was negative for acute intracranial abnormality, changes consistent with age-related atrophy and chronic small vessel ischemia noted.  Portable chest x-ray was negative for focal consolidation, edema, or effusion. Patient was given 1.5 L normal saline and the hospitalist service was consulted to admit for further evaluation  and management.   Assessment/Plan:  Principal Problem:   Type 2 diabetes mellitus with hyperosmolar hyperglycemic state (HHS) ([place]) Active Problems:   Coronary artery disease, non-occlusive   Chronic stable angina (HCC)   AKI (acute kidney injury) ([place])   Dementia with behavioral disturbance (HCC)   Hypertension associated with diabetes ([place])   Hypernatremia   Hyperkalemia   Pressure injury of skin  Uncontrolled type 2 diabetes mellitus with hyperosmolar hyperglycemic state: On presentation serum glucose was 779 without acidosis or ketonuria.  Patient was started on IV insulin drip and fluids.Currently on D5W secondary to hypernatremia.  Continue  long-acting and sliding scale insulin at this time. Latest sodium of 151.  Patient will need insulin on discharge.  Hemoglobin A1c of 11.6 on 08/01/2020.   Acute kidney injury: Likely secondary to volume depletion.  Creatinine of 1.1 at this time.  Improved with IV hydration.  Continue hydration for now.  Hypernatremia: Corrected sodium on presentation was 165 in setting of dehydration.  Currently sodium level of 151.  Continue with D5 water for now.  Still dehydrated.  Hyperkalemia: Improved after insulin and fluid administration.  Latest potassium of 3.9.   Essential hypertension: Patient was on lisinopril at home.  Lisinopril currently on hold due to AKI and volume depletion.  On hydralazine as needed for now.   CAD with chronic stable angina: Resumed Imdur.  Not on antiplatelet or statin at home.   History of subdural hematoma: Status post fall -patient had hematoma on 826 of 3 to 4 mm.  It was treated conservatively.  He completed 7-day course of Keppra for seizure prophylaxis at that time.  CT scan this time without any hemorrhage or hematoma.  Dementia with behavioral disturbance/depression/PTSD: Currently at [facility] memory care unit.  At baseline, patient is nonverbal and noncooperative with some behavioral outbursts.   Continue Aricept, Namenda, BuSpar, Zoloft.  Spoke with the patient's granddaughter at length yesterday regarding dementia with cognitive decline, hypernatremia, dysphagia and poor outcome in the future.  Pressure injury to coccyx, present on admission.  Stage I.  Continue pressure ulcer prevention protocol. Pressure Injury 08/01/20 Coccyx Medial Stage 1 -  Intact skin with non-blanchable redness of a localized area usually over a bony prominence. (Active)  08/01/20 1029  Location: Coccyx  Location Orientation: Medial  Staging: Stage 1 -  Intact skin with non-blanchable redness of a localized area usually over a bony prominence.  Wound Description (Comments):   Present on Admission: Yes      DVT prophylaxis: heparin injection 5,000 Units Start: 07/31/20 2200   Code Status: Full code  Family Communication: None today. I spoke with the patient's granddaughter and updated her about the clinical condition of the patient yesterday.   Status is: Observation  The patient will require care spanning > 2 midnights and should be moved to inpatient because: IV treatments appropriate due to intensity of illness or inability to take PO and Inpatient level of care appropriate due to severity of illness  Dispo: The patient is from: [facility] assisted [facility] facility.              Anticipated d/c is to: Assisted living facility              Anticipated d/c date is: 2 days              Patient currently is not medically stable to d/c.  Consultants: None  Procedures: None  Antibiotics:  None  Anti-infectives (From admission, onward)    None      Subjective: Today, seen and examined at bedside.  Mostly nonverbal.  No interval complaints reported by the nursing staff.  Objective: Vitals:   08/02/20 0420 08/02/20 0450  BP: (!) 100/38 (!) 143/47  Pulse: 60 65  Resp: 11 15  Temp: 98.6 F (37 C)   SpO2: 95%     Intake/Output Summary (Last 24 hours) at 08/02/2020 0801 Last data  filed at 08/02/2020 0602 Gross per 24 hour  Intake 1729.3 ml  Output 475 ml  Net 1254.3 ml   There were no vitals filed for this visit. There is no height or weight on file to calculate BMI.   Physical Exam: General: Mostly nonverbal, not in obvious distress, underlying dementia HENT:   No scleral pallor or icterus noted. Oral mucosa is dry Chest:  Clear breath sounds.  Diminished breath sounds bilaterally. No crackles or wheezes.  CVS: S1 &S2 heard. No murmur.  Regular rate and rhythm. Abdomen: Soft, nontender, nondistended.  Bowel sounds are heard.   Extremities: No cyanosis, clubbing or edema.  Peripheral pulses are palpable. Psych: Alert, awake and oriented, normal mood CNS:  No cranial nerve deficits.  Power equal in all extremities.   Skin: Warm and dry.  No rashes noted.  Diminished skin turgor  Data Review: I have personally reviewed the following laboratory data and studies,  CBC: Recent Labs  Lab 07/31/20 1733 08/01/20 0430 08/01/20 0443 08/02/20 0216  WBC 10.6* 10.6*  --  7.5  NEUTROABS 7.6  --   --   --   HGB 15.6 15.8 16.3 13.4  HCT 50.3 50.8 48.0 41.8  MCV 90.8 90.6  --  87.6  PLT  174 174  --  112*   Basic Metabolic Panel: Recent Labs  Lab 08/01/20 0108 08/01/20 0108 08/01/20 0430 08/01/20 0443 08/01/20 1242 08/01/20 1658 08/02/20 0216  NA 158*   < > 158* 162* 153* 152* 151*  K 3.5   < > 3.5 4.0 3.8 3.8 3.9  CL 122*   < > 118* 124* 121* 117* 119*  CO2 27  --  28  --  24 27 18*  GLUCOSE 111*   < > 93 106* 144* 178* 197*  BUN 47*   < > 46* 47* 40* 39* 36*  CREATININE 1.62*   < > 1.46* 1.30* 1.23 1.23 1.12  CALCIUM 10.0  --  10.2  --  9.1 9.2 8.8*  MG  --   --  2.6*  --   --   --  2.1  PHOS  --   --   --   --   --   --  3.7   < > = values in this interval not displayed.   Liver Function Tests: Recent Labs  Lab 07/31/20 1733 08/02/20 0216  AST 24 27  ALT 40 25  ALKPHOS 98 65  BILITOT 1.6* 1.3*  PROT 7.1 5.3*  ALBUMIN 4.0 2.8*   No  results for input(s): LIPASE, AMYLASE in the last 168 hours. Recent Labs  Lab 07/31/20 1733  AMMONIA <9*   Cardiac Enzymes: No results for input(s): CKTOTAL, CKMB, CKMBINDEX, TROPONINI in the last 168 hours. BNP (last 3 results) No results for input(s): BNP in the last 8760 hours.  ProBNP (last 3 results) No results for input(s): PROBNP in the last 8760 hours.  CBG: Recent Labs  Lab 08/01/20 0952 08/01/20 1025 08/01/20 1140 08/01/20 1608 08/01/20 2210  GLUCAP 73 80 107* 134* 180*   Recent Results (from the past 240 hour(s))  Respiratory Panel by RT PCR (Flu A&B, Covid) - Nasopharyngeal Swab     Status: None   Collection Time: 07/31/20  7:21 PM   Specimen: Nasopharyngeal Swab  Result Value Ref Range Status   SARS Coronavirus 2 by RT PCR NEGATIVE NEGATIVE Final    Comment: (NOTE) SARS-CoV-2 target nucleic acids are NOT DETECTED.  The SARS-CoV-2 RNA is generally detectable in upper respiratoy specimens during the acute phase of infection. The lowest concentration of SARS-CoV-2 viral copies this assay can detect is 131 copies/mL. A negative result does not preclude SARS-Cov-2 infection and should not be used as the sole basis for treatment or other patient management decisions. A negative result may occur with  improper specimen collection/handling, submission of specimen other than nasopharyngeal swab, presence of viral mutation(s) within the areas targeted by this assay, and inadequate number of viral copies (<131 copies/mL). A negative result must be combined with clinical observations, patient history, and epidemiological information. The expected result is Negative.  Fact Sheet for Patients:  https://www.moore.com/  Fact Sheet for Healthcare Providers:  https://www.young.biz/  This test is no t yet approved or cleared by the [place] FDA and  has been authorized for detection and/or diagnosis of SARS-CoV-2 by FDA under  an Emergency Use Authorization (EUA). This EUA will remain  in effect (meaning this test can be used) for the duration of the COVID-19 declaration under Section 564(b)(1) of the Act, 21 U.S.C. section 360bbb-3(b)(1), unless the authorization is terminated or revoked sooner.     Influenza A by PCR NEGATIVE NEGATIVE Final   Influenza B by PCR NEGATIVE NEGATIVE Final    Comment: (NOTE) The  Xpert Xpress SARS-CoV-2/FLU/RSV assay is intended as an aid in  the diagnosis of influenza from Nasopharyngeal swab specimens and  should not be used as a sole basis for treatment. Nasal washings and  aspirates are unacceptable for Xpert Xpress SARS-CoV-2/FLU/RSV  testing.  Fact Sheet for Patients: PinkCheek.be  Fact Sheet for Healthcare Providers: GravelBags.it  This test is not yet approved or cleared by the [place] FDA and  has been authorized for detection and/or diagnosis of SARS-CoV-2 by  FDA under an Emergency Use Authorization (EUA). This EUA will remain  in effect (meaning this test can be used) for the duration of the  Covid-19 declaration under Section 564(b)(1) of the Act, 21  U.S.C. section 360bbb-3(b)(1), unless the authorization is  terminated or revoked. Performed at [facility], [place] [address] [place], [place] [address]   MRSA PCR Screening     Status: None   Collection Time: 08/01/20 10:32 AM   Specimen: Nasal Mucosa; Nasopharyngeal  Result Value Ref Range Status   MRSA by PCR NEGATIVE NEGATIVE Final    Comment:        The GeneXpert MRSA Assay (FDA approved for NASAL specimens only), is one component of a comprehensive MRSA colonization surveillance program. It is not intended to diagnose MRSA infection nor to guide or monitor treatment for MRSA infections. Performed at [facility], [place] [address] [place], [place] [address]      Studies: CT Head Wo Contrast  Result Date:  07/31/2020 CLINICAL DATA:  Change in mental status EXAM: CT HEAD WITHOUT CONTRAST TECHNIQUE: Contiguous axial images were obtained from the base of the skull through the vertex without intravenous contrast. COMPARISON:  None. FINDINGS: Brain: No evidence of acute territorial infarction, hemorrhage, hydrocephalus,extra-axial collection or mass lesion/mass effect. There is dilatation the ventricles and sulci consistent with age-related atrophy. Low-attenuation changes in the deep white matter consistent with small vessel ischemia. Vascular: No hyperdense vessel or unexpected calcification. Skull: The skull is intact. No fracture or focal lesion identified. Sinuses/Orbits: The visualized paranasal sinuses and mastoid air cells are clear. The orbits and globes intact. Other: None IMPRESSION: No acute intracranial abnormality. Findings consistent with age related atrophy and chronic small vessel ischemia Electronically Signed   By: Prudencio Pair M.D.   On: 07/31/2020 17:43   DG Chest Port 1 View  Result Date: 07/31/2020 CLINICAL DATA:  Altered mental status. EXAM: PORTABLE CHEST 1 VIEW COMPARISON:  07/15/2020 FINDINGS: The cardiac silhouette, mediastinal and hilar contours are within normal limits and stable. Stable tortuosity and calcification of the thoracic aorta. No acute pulmonary findings. No infiltrates, edema or effusions. No pulmonary lesions. The bony thorax is intact. IMPRESSION: No acute cardiopulmonary findings. Electronically Signed   By: Marijo Sanes M.D.   On: 07/31/2020 18:46     Flora Lipps, MD  [facility] 08/02/2020

## 2020-08-02 NOTE — Progress Notes (Signed)
Notified by central telemetry, patient had a 7 beat run vtach.  Patient had 7 beats vtach this morning also.  Vitals taken, stable at this time.  Physician notified, no new orders, says will keep an eye on this. Marland Kitchen

## 2020-08-03 DIAGNOSIS — I208 Other forms of angina pectoris: Secondary | ICD-10-CM | POA: Diagnosis not present

## 2020-08-03 DIAGNOSIS — I2510 Atherosclerotic heart disease of native coronary artery without angina pectoris: Secondary | ICD-10-CM | POA: Diagnosis not present

## 2020-08-03 DIAGNOSIS — N179 Acute kidney failure, unspecified: Secondary | ICD-10-CM | POA: Diagnosis not present

## 2020-08-03 DIAGNOSIS — E1100 Type 2 diabetes mellitus with hyperosmolarity without nonketotic hyperglycemic-hyperosmolar coma (NKHHC): Secondary | ICD-10-CM | POA: Diagnosis not present

## 2020-08-03 LAB — COMPREHENSIVE METABOLIC PANEL
ALT: 24 U/L (ref 0–44)
AST: 18 U/L (ref 15–41)
Albumin: 3.2 g/dL — ABNORMAL LOW (ref 3.5–5.0)
Alkaline Phosphatase: 85 U/L (ref 38–126)
Anion gap: 8 (ref 5–15)
BUN: 29 mg/dL — ABNORMAL HIGH (ref 8–23)
CO2: 28 mmol/L (ref 22–32)
Calcium: 9.2 mg/dL (ref 8.9–10.3)
Chloride: 111 mmol/L (ref 98–111)
Creatinine, Ser: 1.18 mg/dL (ref 0.61–1.24)
GFR, Estimated: >60 mL/min (ref >60)
Glucose, Bld: 198 mg/dL — ABNORMAL HIGH (ref 70–99)
Potassium: 4.2 mmol/L (ref 3.5–5.1)
Sodium: 147 mmol/L — ABNORMAL HIGH (ref 135–145)
Total Bilirubin: 1.7 mg/dL — ABNORMAL HIGH (ref 0.3–1.2)
Total Protein: 6 g/dL — ABNORMAL LOW (ref 6.5–8.1)

## 2020-08-03 LAB — GLUCOSE, CAPILLARY
Glucose-Capillary: 189 mg/dL — ABNORMAL HIGH (ref 70–99)
Glucose-Capillary: 162 mg/dL — ABNORMAL HIGH (ref 70–99)
Glucose-Capillary: 177 mg/dL — ABNORMAL HIGH (ref 70–99)
Glucose-Capillary: 219 mg/dL — ABNORMAL HIGH (ref 70–99)
Glucose-Capillary: 147 mg/dL — ABNORMAL HIGH (ref 70–99)

## 2020-08-03 LAB — MAGNESIUM: Magnesium: 2.1 mg/dL (ref 1.7–2.4)

## 2020-08-03 LAB — URINE CULTURE: Culture: >=100000 — AB

## 2020-08-03 LAB — CBC
HCT: 45.7 % (ref 39.0–52.0)
Hemoglobin: 14.7 g/dL (ref 13.0–17.0)
MCH: 28.1 pg (ref 26.0–34.0)
MCHC: 32.2 g/dL (ref 30.0–36.0)
MCV: 87.2 fL (ref 80.0–100.0)
Platelets: 114 10*3/uL — ABNORMAL LOW (ref 150–400)
RBC: 5.24 MIL/uL (ref 4.22–5.81)
RDW: 13.9 % (ref 11.5–15.5)
WBC: 7.6 10*3/uL (ref 4.0–10.5)
nRBC: 0 % (ref 0.0–0.2)

## 2020-08-03 LAB — PHOSPHORUS: Phosphorus: 4.1 mg/dL (ref 2.5–4.6)

## 2020-08-03 NOTE — Evaluation (Signed)
Physical Therapy Evaluation Patient Details Name: Dean Harris MRN: [id number] DOB: [birth year] Today's Date: 08/03/2020   History of Present Illness  Patient is a 79 y/o male who presents with AMS and lethargy. Admitted with hyperglycemia. PMH includes Rt SDH, UTI, dementia, CAD, bilateral carotid endarterectomy, HLD, depression, anxiety, HTN, PTSD, PNA, DM.  Clinical Impression  Patient presents with hx of dementia, likely generalized weakness and impaired mobility. Pt able to state his name and some yes/no responses but not always accurate/appropriate. Pt follows <15% of commands this session. Requires Mod A for bed mobility with multi modal cues with LEs and trunk to get to EOB; Able to sit EOB for a few seconds and then pt resisting and returning self to supine x2. Pt not able to provide any history but per granddaughter ~2 months ago (via chart), pt was a limited ambulator with assist with RW and dependent for ADLs at baseline. Pt would benefit from return to [facility] ALF/memory unit (familiar environment with familiar faces/routine) where all further needs can be met at the facility. Discharge from therapy.    Follow Up Recommendations Other (comment) (return to memory unit at [facility])    Equipment Recommendations  None recommended by PT    Recommendations for Other Services       Precautions / Restrictions Precautions Precautions: Fall Restrictions Weight Bearing Restrictions: No      Mobility  Bed Mobility Overal bed mobility: Needs Assistance Bed Mobility: Supine to Sit;Sit to Supine     Supine to sit: Mod assist;HOB elevated Sit to supine: Min guard   General bed mobility comments: Assist with LEs and trunk to get to EOB, not following commands well even multi modal cues. once sitting EOB, pt resisting and returning self to supine x2.    Transfers                 General transfer comment: Unable to assess.  Ambulation/Gait              General Gait Details: Unable to assess  Stairs            Wheelchair Mobility    Modified Rankin (Stroke Patients Only)       Balance Overall balance assessment: Needs assistance Sitting-balance support: Feet unsupported;No upper extremity supported Sitting balance-Leahy Scale: Fair Sitting balance - Comments: Able to sit EOB for a few seconds without support but quickly returned self to supine                                     Pertinent Vitals/Pain Pain Assessment: Faces Faces Pain Scale: No hurt    Home Living Family/patient expects to be discharged to:: Other (Comment) ([facility]/memory care unit)                 Additional Comments: Memory Care Unit: [facility]    Prior Function Level of Independence: Needs assistance   Gait / Transfers Assistance Needed: walking with assist with RW for limited distances. He is also pushed in [facility] at memory care unit  ADL's / Homemaking Assistance Needed: dependent for ADLs, can feed self with supervision  Comments: History obtained from grandaughter [place] ~2 months ago, not sure of accuracy now.     Hand Dominance        Extremity/Trunk Assessment   Upper Extremity Assessment Upper Extremity Assessment: Defer to OT evaluation    Lower Extremity Assessment  Lower Extremity Assessment: Difficult to assess due to impaired cognition;Generalized weakness (Able to lift minimally off bed to donn socks.)       Communication   Communication: Expressive difficulties  Cognition Arousal/Alertness: Awake/alert Behavior During Therapy: Flat affect Overall Cognitive Status: History of cognitive impairments - at baseline                                 General Comments: Pt with hx of dementia. Able to state name. Occasionally answering yes/no but not always appropriately. Follows <25% of commands. Fidgeting with lines/covers/foley.      General Comments General comments (skin  integrity, edema, etc.): VSS on RA.    Exercises     Assessment/Plan    PT Assessment All further PT needs can be met in the next venue of care  PT Problem List Decreased strength;Decreased mobility;Decreased safety awareness;Decreased cognition;Decreased balance       PT Treatment Interventions      PT Goals (Current goals can be found in the Care Plan section)  Acute Rehab PT Goals Patient Stated Goal: none stated as pt unable however per chart, family wants him to return to memory unit at [facility] PT Goal Formulation: Patient unable to participate in goal setting    Frequency     Barriers to discharge        Co-evaluation               AM-PAC PT "6 Clicks" Mobility  Outcome Measure Help needed turning from your back to your side while in a flat bed without using bedrails?: A Little Help needed moving from lying on your back to sitting on the side of a flat bed without using bedrails?: A Lot Help needed moving to and from a bed to a chair (including a wheelchair)?: A Lot Help needed standing up from a chair using your arms (e.g., wheelchair or bedside chair)?: A Lot Help needed to walk in hospital room?: A Lot Help needed climbing 3-5 steps with a railing? : A Lot 6 Click Score: 13    End of Session   Activity Tolerance: Other (comment) (limited by cognitive deficits) Patient left: in bed;with call bell/phone within reach;with bed alarm set Nurse Communication: Mobility status PT Visit Diagnosis: Muscle weakness (generalized) (M62.81);Difficulty in walking, not elsewhere classified (R26.2)    Time: 2233-6122 PT Time Calculation (min) (ACUTE ONLY): 11 min   Charges:   PT Evaluation $PT Eval Moderate Complexity: 1 Mod          Marisa Severin, PT, DPT Acute Rehabilitation Services Pager [phone] Office [place] 08/03/2020, 1:30 PM

## 2020-08-03 NOTE — TOC Progression Note (Signed)
Transition of Care [facility]) - Progression Note    Patient Details  Name: Dean Harris MRN: [id number] Date of Birth: Jul 14, 1941  Transition of Care [facility]) CM/SW Contact  Eduard Roux, [place] Phone Number: 08/03/2020, 4:01 PM  Clinical Narrative:     CSW called [facility] - informed of expected to d/c tomorrow per MD- facility states they can not admit over the weekend because they are unable to get his medications. They can not admit until Monday.   Antony Blackbird, MSW, LCSWA Clinical Social Worker   Expected Discharge Plan: Assisted Living Barriers to Discharge: Continued Medical Work up  Expected Discharge Plan and Services Expected Discharge Plan: Assisted Living In-house Referral: Clinical Social Work     Living arrangements for the past 2 months: Assisted Living Facility ([facility])                                       Social Determinants of Health (SDOH) Interventions    Readmission Risk Interventions No flowsheet data found.

## 2020-08-03 NOTE — Progress Notes (Addendum)
PROGRESS NOTE  Dean Harris GYB:[id number] DOB: 22-Oct-1940 DOA: 07/31/2020 PCP: Chevis Pretty, FNP   LOS: 1 day   Brief narrative:  Dean Harris is a 79 y.o. male with medical history significant for chronic stable angina, type 2 diabetes mellitus, hypertension, PTSD/depression, and dementia with behavioral disturbances presented to the ED from [facility] care unit for evaluation of lethargy and hyperglycemia. Patient was unable to provide history due to baseline dementia. Granddaughter stated that at baseline, patient has dementia and is essentially nonverbal. He is minimally interactive and will occasionally have mumbled speech. He has been noted to have angry and violent behavior. He has been residing at the [facility] care unit for the last 4 months. Patient was reported to have been recently treated for UTI with ciprofloxacin from 10/29 >> 11/5. [facility] staff reported that he was having shouting outbursts over the weekend. He has been noted to have increased urinary frequency. Blood sugar was checked and was noted to be significantly elevated. He was sent to the ED for further evaluation.   In the ED, his labs were notable for severe hyperglycemia with serum glucose level of   779, sodium 149 (165 when corrected for hyperglycemia), potassium 5.2, bicarb 24, BUN 50, creatinine 1.87 (1.11 on 07/15/2020), anion gap 16, AST 24, ALT 40, alk phos 98, total bilirubin 1.6, WBC 10.6, hemoglobin 15.6, platelets 174,000, ammonia <9, serum ethanol <10.  Urinalysis showed negative ketones but glycosuria with 21-50 white cells.CT head without contrast was negative for acute intracranial abnormality, changes consistent with age-related atrophy and chronic small vessel ischemia noted.  Portable chest x-ray was negative for focal consolidation, edema, or effusion. Patient was given 1.5 L normal saline and the hospitalist service was consulted to admit for further evaluation  and management.   Assessment/Plan:  Principal Problem:   Type 2 diabetes mellitus with hyperosmolar hyperglycemic state (HHS) ([place]) Active Problems:   Coronary artery disease, non-occlusive   Chronic stable angina (HCC)   AKI (acute kidney injury) ([place])   Dementia with behavioral disturbance (HCC)   Hypertension associated with diabetes ([place])   Hypernatremia   Hyperkalemia   Pressure injury of skin   Hyperosmolar hyperglycemic state (HHS) ([place])  Uncontrolled type 2 diabetes mellitus with hyperosmolar hyperglycemic state: On presentation, serum glucose was 779 without acidosis or ketonuria.  Patient was started on IV insulin drip and fluids.  Patient received D5 water due to hypernatremia.  Hypernatremia is starting to improve..  Continue  long-acting and sliding scale insulin at this time. Latest sodium of 147.  Patient will need insulin on discharge.  Hemoglobin A1c of 11.6 on 08/01/2020.   Acute kidney injury: secondary to volume depletion.  Creatinine of 1.1 at this time.  Improved with IV hydration.  Encourage oral intake at this time.  Discontinue IV fluids.  Hypernatremia: Corrected sodium on presentation was 165 in setting of dehydration.  Hydration status improving.  Discontinue D5 water.  Encourage oral hydration.  Check BMP again in a.m.  Still borderline high sodium levels.  Hyperkalemia: Improved after insulin and fluid administration.  Latest potassium >4.0   Essential hypertension: Patient was on lisinopril at home.  Lisinopril currently on hold due to AKI and volume depletion.  On hydralazine as needed for now.   CAD with chronic stable angina: Resumed Imdur.  Not on antiplatelet or statin at home.   History of subdural hematoma: Status post fall -patient had hematoma on 8/26 of 3 to 4 mm.  It was treated conservatively.  He completed 7-day course of Keppra for seizure prophylaxis at that time.  CT scan at this time without any hemorrhage or hematoma.       Dementia with behavioral disturbance/depression/PTSD: Currently at [facility] care unit.  At baseline, patient is nonverbal and noncooperative with some behavioral outbursts.  Continue Aricept, Namenda, BuSpar, Zoloft.  Guarded prognosis in the future.   Pressure injury to coccyx, present on admission.  Stage I.  Continue pressure ulcer prevention protocol. Pressure Injury 08/01/20 Coccyx Medial Stage 1 -  Intact skin with non-blanchable redness of a localized area usually over a bony prominence. (Active)  08/01/20 1029  Location: Coccyx  Location Orientation: Medial  Staging: Stage 1 -  Intact skin with non-blanchable redness of a localized area usually over a bony prominence.  Wound Description (Comments):   Present on Admission: Yes     DVT prophylaxis: heparin injection 5,000 Units Start: 07/31/20 2200   Code Status: Full code  Family Communication: I spoke with the patient's granddaughter and updated her about the clinical condition of the patient and the plan for this patient likely tomorrow.  Status is: Inpatient  The patient is  inpatient because: IV treatments appropriate due to intensity of illness or inability to take PO and Inpatient level of care appropriate due to severity of illness  Dispo: The patient is from: [facility] assisted living facility.              Anticipated d/c is to: Assisted living facility.  Seen by physical therapy during hospitalization.              Anticipated d/c date is: 1 to 2 days.  Follow BMP in a.m.  If remains improved, consider disposition by a.m.              Patient currently is not medically stable to d/c.  Consultants:  None  Procedures:  None  Antibiotics:  . None  Anti-infectives (From admission, onward)   None     Subjective: Today, patient was seen and examined at bedside.  Verbalizing some.  Denies any pain nausea vomiting.  Objective: Vitals:   08/03/20 0858 08/03/20 1155  BP: (!) 127/51 115/72   Pulse: (!) 56 (!) 53  Resp: 20 12  Temp: 98 F (36.7 C) 98 F (36.7 C)  SpO2: 98% 95%    Intake/Output Summary (Last 24 hours) at 08/03/2020 1525 Last data filed at 08/03/2020 1345 Gross per 24 hour  Intake 960 ml  Output 1250 ml  Net -290 ml   There were no vitals filed for this visit. There is no height or weight on file to calculate BMI.   Physical Exam:  General: Mostly nonverbal, not in obvious distress, underlying dementia HENT:   No scleral pallor or icterus noted. Oral mucosa is moist Chest:  Clear breath sounds.  Diminished breath sounds bilaterally. No crackles or wheezes.  CVS: S1 &S2 heard. No murmur.  Regular rate and rhythm. Abdomen: Soft, nontender, nondistended.  Bowel sounds are heard.   Extremities: No cyanosis, clubbing or edema.  Peripheral pulses are palpable. Psych: Alert, awake and communicative, normal mood CNS:  No cranial nerve deficits.  Power equal in all extremities.   Skin: Warm and dry.  No rashes noted.    Data Review: I have personally reviewed the following laboratory data and studies,  CBC: Recent Labs  Lab 07/31/20 1733 08/01/20 0430 08/01/20 0443 08/02/20 0216 08/03/20 0915  WBC 10.6* 10.6*  --  7.5 7.6  NEUTROABS 7.6  --   --   --   --   HGB 15.6 15.8 16.3 13.4 14.7  HCT 50.3 50.8 48.0 41.8 45.7  MCV 90.8 90.6  --  87.6 87.2  PLT 174 174  --  112* 003*   Basic Metabolic Panel: Recent Labs  Lab 08/01/20 0430 08/01/20 0430 08/01/20 0443 08/01/20 1242 08/01/20 1658 08/02/20 0216 08/03/20 0915  NA 158*   < > 162* 153* 152* 151* 147*  K 3.5   < > 4.0 3.8 3.8 3.9 4.2  CL 118*   < > 124* 121* 117* 119* 111  CO2 28  --   --  24 27 18* 28  GLUCOSE 93   < > 106* 144* 178* 197* 198*  BUN 46*   < > 47* 40* 39* 36* 29*  CREATININE 1.46*   < > 1.30* 1.23 1.23 1.12 1.18  CALCIUM 10.2  --   --  9.1 9.2 8.8* 9.2  MG 2.6*  --   --   --   --  2.1 2.1  PHOS  --   --   --   --   --  3.7 4.1   < > = values in this interval not  displayed.   Liver Function Tests: Recent Labs  Lab 07/31/20 1733 08/02/20 0216 08/03/20 0915  AST _0 ALT 40 25 24  ALKPHOS 98 65 85  BILITOT 1.6* 1.3* 1.7*  PROT 7.1 5.3* 6.0*  ALBUMIN 4.0 2.8* 3.2*   No results for input(s): LIPASE, AMYLASE in the last 168 hours. Recent Labs  Lab 07/31/20 1733  AMMONIA <9*   Cardiac Enzymes: No results for input(s): CKTOTAL, CKMB, CKMBINDEX, TROPONINI in the last 168 hours. BNP (last 3 results) No results for input(s): BNP in the last 8760 hours.  ProBNP (last 3 results) No results for input(s): PROBNP in the last 8760 hours.  CBG: Recent Labs  Lab 08/02/20 1640 08/02/20 2127 08/03/20 0640 08/03/20 0855 08/03/20 1153  GLUCAP 192* 154* 189* 162* 177*   Recent Results (from the past 240 hour(s))  Urine culture     Status: Abnormal   Collection Time: 07/31/20  4:51 PM   Specimen: Urine, Random  Result Value Ref Range Status   Specimen Description URINE, RANDOM  Final   Special Requests   Final    NONE Performed at [facility], [place] [address], [place], [place] [address]    Culture >=100,000 COLONIES/mL ENTEROCOCCUS FAECALIS (A)  Final   Report Status 08/03/2020 FINAL  Final   Organism ID, Bacteria ENTEROCOCCUS FAECALIS (A)  Final      Susceptibility   Enterococcus faecalis - MIC*    AMPICILLIN <=2 SENSITIVE Sensitive     NITROFURANTOIN 64 INTERMEDIATE Intermediate     VANCOMYCIN 1 SENSITIVE Sensitive     * >=100,000 COLONIES/mL ENTEROCOCCUS FAECALIS  Respiratory Panel by RT PCR (Flu A&B, Covid) - Nasopharyngeal Swab     Status: None   Collection Time: 07/31/20  7:21 PM   Specimen: Nasopharyngeal Swab  Result Value Ref Range Status   SARS Coronavirus 2 by RT PCR NEGATIVE NEGATIVE Final    Comment: (NOTE) SARS-CoV-2 target nucleic acids are NOT DETECTED.  The SARS-CoV-2 RNA is generally detectable in upper respiratoy specimens during the acute phase of infection. The lowest concentration of SARS-CoV-2  viral copies this assay can detect is 131 copies/mL. A negative result does not preclude SARS-Cov-2 infection and should not be  used as the sole basis for treatment or other patient management decisions. A negative result may occur with  improper specimen collection/handling, submission of specimen other than nasopharyngeal swab, presence of viral mutation(s) within the areas targeted by this assay, and inadequate number of viral copies (<131 copies/mL). A negative result must be combined with clinical observations, patient history, and epidemiological information. The expected result is Negative.  Fact Sheet for Patients:  PinkCheek.be  Fact Sheet for Healthcare Providers:  GravelBags.it  This test is no t yet approved or cleared by the [place] FDA and  has been authorized for detection and/or diagnosis of SARS-CoV-2 by FDA under an Emergency Use Authorization (EUA). This EUA will remain  in effect (meaning this test can be used) for the duration of the COVID-19 declaration under Section 564(b)(1) of the Act, 21 U.S.C. section 360bbb-3(b)(1), unless the authorization is terminated or revoked sooner.     Influenza A by PCR NEGATIVE NEGATIVE Final   Influenza B by PCR NEGATIVE NEGATIVE Final    Comment: (NOTE) The Xpert Xpress SARS-CoV-2/FLU/RSV assay is intended as an aid in  the diagnosis of influenza from Nasopharyngeal swab specimens and  should not be used as a sole basis for treatment. Nasal washings and  aspirates are unacceptable for Xpert Xpress SARS-CoV-2/FLU/RSV  testing.  Fact Sheet for Patients: PinkCheek.be  Fact Sheet for Healthcare Providers: GravelBags.it  This test is not yet approved or cleared by the [place] FDA and  has been authorized for detection and/or diagnosis of SARS-CoV-2 by  FDA under an Emergency Use Authorization (EUA).  This EUA will remain  in effect (meaning this test can be used) for the duration of the  Covid-19 declaration under Section 564(b)(1) of the Act, 21  U.S.C. section 360bbb-3(b)(1), unless the authorization is  terminated or revoked. Performed at [facility], [place] [address] [place], [place] [address]   MRSA PCR Screening     Status: None   Collection Time: 08/01/20 10:32 AM   Specimen: Nasal Mucosa; Nasopharyngeal  Result Value Ref Range Status   MRSA by PCR NEGATIVE NEGATIVE Final    Comment:        The GeneXpert MRSA Assay (FDA approved for NASAL specimens only), is one component of a comprehensive MRSA colonization surveillance program. It is not intended to diagnose MRSA infection nor to guide or monitor treatment for MRSA infections. Performed at [facility], [place] [address], [place], [place] [address]      Studies: No results found.   Flora Lipps, MD  [facility] 08/03/2020

## 2020-08-04 DIAGNOSIS — E1100 Type 2 diabetes mellitus with hyperosmolarity without nonketotic hyperglycemic-hyperosmolar coma (NKHHC): Secondary | ICD-10-CM | POA: Diagnosis not present

## 2020-08-04 DIAGNOSIS — I208 Other forms of angina pectoris: Secondary | ICD-10-CM | POA: Diagnosis not present

## 2020-08-04 DIAGNOSIS — I2510 Atherosclerotic heart disease of native coronary artery without angina pectoris: Secondary | ICD-10-CM | POA: Diagnosis not present

## 2020-08-04 DIAGNOSIS — N179 Acute kidney failure, unspecified: Secondary | ICD-10-CM | POA: Diagnosis not present

## 2020-08-04 LAB — CBC
HCT: 39.3 % (ref 39.0–52.0)
Hemoglobin: 13.1 g/dL (ref 13.0–17.0)
MCH: 28.8 pg (ref 26.0–34.0)
MCHC: 33.3 g/dL (ref 30.0–36.0)
MCV: 86.4 fL (ref 80.0–100.0)
Platelets: 96 10*3/uL — ABNORMAL LOW (ref 150–400)
RBC: 4.55 MIL/uL (ref 4.22–5.81)
RDW: 13.7 % (ref 11.5–15.5)
WBC: 6.7 10*3/uL (ref 4.0–10.5)
nRBC: 0 % (ref 0.0–0.2)

## 2020-08-04 LAB — COMPREHENSIVE METABOLIC PANEL
ALT: 17 U/L (ref 0–44)
AST: 13 U/L — ABNORMAL LOW (ref 15–41)
Albumin: 2.9 g/dL — ABNORMAL LOW (ref 3.5–5.0)
Alkaline Phosphatase: 69 U/L (ref 38–126)
Anion gap: 9 (ref 5–15)
BUN: 28 mg/dL — ABNORMAL HIGH (ref 8–23)
CO2: 23 mmol/L (ref 22–32)
Calcium: 8.7 mg/dL — ABNORMAL LOW (ref 8.9–10.3)
Chloride: 110 mmol/L (ref 98–111)
Creatinine, Ser: 1 mg/dL (ref 0.61–1.24)
GFR, Estimated: >60 mL/min (ref >60)
Glucose, Bld: 195 mg/dL — ABNORMAL HIGH (ref 70–99)
Potassium: 3.6 mmol/L (ref 3.5–5.1)
Sodium: 142 mmol/L (ref 135–145)
Total Bilirubin: 1 mg/dL (ref 0.3–1.2)
Total Protein: 5.2 g/dL — ABNORMAL LOW (ref 6.5–8.1)

## 2020-08-04 LAB — PHOSPHORUS: Phosphorus: 3.9 mg/dL (ref 2.5–4.6)

## 2020-08-04 LAB — GLUCOSE, CAPILLARY
Glucose-Capillary: 172 mg/dL — ABNORMAL HIGH (ref 70–99)
Glucose-Capillary: 160 mg/dL — ABNORMAL HIGH (ref 70–99)
Glucose-Capillary: 213 mg/dL — ABNORMAL HIGH (ref 70–99)
Glucose-Capillary: 137 mg/dL — ABNORMAL HIGH (ref 70–99)
Glucose-Capillary: 83 mg/dL (ref 70–99)

## 2020-08-04 LAB — MAGNESIUM: Magnesium: 1.9 mg/dL (ref 1.7–2.4)

## 2020-08-04 NOTE — Progress Notes (Signed)
Notification per telemetry that patient's HR dropped to 44 nonsustained with PAC's present.  Strip saved for review.

## 2020-08-04 NOTE — Progress Notes (Addendum)
PROGRESS NOTE  Dean Harris:[id number] DOB: 1941-05-07 DOA: 07/31/2020 PCP: Chevis Pretty, FNP   LOS: 2 days   Brief narrative:  Dean Harris is a 79 y.o. male with medical history significant for chronic stable angina, type 2 diabetes mellitus, hypertension, PTSD/depression, and dementia with behavioral disturbances presented to the ED from [facility] care unit for evaluation of lethargy and hyperglycemia. Patient was unable to provide history due to baseline dementia. Granddaughter stated that at baseline, patient has dementia and is essentially nonverbal. He is minimally interactive and will occasionally has mumbled speech. He has been noted to have angry and violent behavior. He has been residing at the [facility] care unit for the last 4 months. Patient was reported to have been recently treated for UTI with ciprofloxacin from 10/29 >> 11/5. [facility] staff reported that he was having shouting outbursts over the weekend. He has been noted to have increased urinary frequency. Blood sugar was checked and was noted to be significantly elevated. He was sent to the ED for further evaluation.   In the ED, his labs were notable for severe hyperglycemia with serum glucose level of   779, sodium 149 (165 when corrected for hyperglycemia), potassium 5.2, bicarb 24, BUN 50, creatinine 1.87 (1.11 on 07/15/2020), anion gap 16, AST 24, ALT 40, alk phos 98, total bilirubin 1.6, WBC 10.6, hemoglobin 15.6, platelets 174,000, ammonia <9, serum ethanol <10.  Urinalysis showed negative ketones but glycosuria with 21-50 white cells.CT head without contrast was negative for acute intracranial abnormality, changes consistent with age-related atrophy and chronic small vessel ischemia noted.  Portable chest x-ray was negative for focal consolidation, edema, or effusion. Patient was given 1.5 L normal saline and the hospitalist service was consulted to admit for further evaluation  and management.   Assessment/Plan:  Principal Problem:   Type 2 diabetes mellitus with hyperosmolar hyperglycemic state (HHS) ([place]) Active Problems:   Coronary artery disease, non-occlusive   Chronic stable angina (HCC)   AKI (acute kidney injury) ([place])   Dementia with behavioral disturbance (HCC)   Hypertension associated with diabetes ([place])   Hypernatremia   Hyperkalemia   Pressure injury of skin   Hyperosmolar hyperglycemic state (HHS) ([place])  Uncontrolled type 2 diabetes mellitus with hyperosmolar hyperglycemic state: On presentation, serum glucose was 779 without acidosis or ketonuria.  Patient was started on IV insulin drip and fluids.  Patient also received D5 water due to hypernatremia.  Improving hypernatremia at this time.  Sodium of 142.  Off IV fluids at this time.  Encourage oral intake.  Continue  long-acting and sliding scale insulin at this time. Hemoglobin A1c of 11.6 on 08/01/2020.   Acute kidney injury: secondary to volume depletion.  Creatinine of 1.0 at this time.  Improved with IV hydration.  Encourage oral intake at this time.  Off IV fluids.  Hypernatremia: Corrected sodium on presentation was 165 in setting of dehydration.  Improved sodium levels at this time.  Increase oral fluids.  Hyperkalemia: Improved after insulin and fluid administration.  Latest potassium 3.6.   Essential hypertension: Patient was on lisinopril at home.  Lisinopril currently on hold due to AKI and volume depletion.  On hydralazine as needed for now.  Blood pressure is 115/43 at this time.  Resume lisinopril when appropriate.   CAD with chronic stable angina: Resumed Imdur.  Not on antiplatelet or statin at home.   History of subdural hematoma: Status post fall -patient had hematoma on 05/10/20 with 3  to 4 mm subdural hematoma.  It was treated conservatively.  He also completed 7-day course of Keppra for seizure prophylaxis at that time.  CT he did scan at this time without any  hemorrhage or hematoma.     Dementia with behavioral disturbance/depression/PTSD: Currently at [facility] care unit.  At baseline, patient is nonverbal and noncooperative with some behavioral outbursts.  Continue Aricept, Namenda, BuSpar, Zoloft.  Guarded prognosis in the future.  This has been explained to the patient's granddaughter in detail.   Pressure injury to coccyx, present on admission.  Stage I.  Continue pressure ulcer prevention protocol.  Pressure Injury 08/01/20 Coccyx Medial Stage 1 -  Intact skin with non-blanchable redness of a localized area usually over a bony prominence. (Active)  08/01/20 1029  Location: Coccyx  Location Orientation: Medial  Staging: Stage 1 -  Intact skin with non-blanchable redness of a localized area usually over a bony prominence.  Wound Description (Comments):   Present on Admission: Yes     DVT prophylaxis: heparin injection 5,000 Units Start: 07/31/20 2200   Code Status: Full code  Family Communication: None today.  Spoke with the patient's granddaughter Caryl Pina yesterday  Status is: Inpatient  The patient is  inpatient because: IV treatments appropriate due to intensity of illness or inability to take PO and Inpatient level of care appropriate due to severity of illness  Dispo: The patient is from: [facility] assisted living facility.              Anticipated d/c is to: Assisted living facility.                Anticipated d/c date is: 1 to 2 days , [facility] unable to take patients over the weekend as per transition of care           Patient currently is medically stable for discharge  Consultants:  None  Procedures:  None  Antibiotics:  . None  Anti-infectives (From admission, onward)   None     Subjective: Today, patient was seen and examined at bedside.  Only answered good morning to me.  Poor historian with underlying dementia..  Objective: Vitals:   08/04/20 0400 08/04/20 0403  BP:  (!) 115/43    Pulse: (!) 55 (!) 52  Resp: 14 15  Temp:  98 F (36.7 C)  SpO2:  95%    Intake/Output Summary (Last 24 hours) at 08/04/2020 0820 Last data filed at 08/04/2020 0404 Gross per 24 hour  Intake 960 ml  Output 850 ml  Net 110 ml   There were no vitals filed for this visit. There is no height or weight on file to calculate BMI.   Physical Exam:  General: Alert awake with underlying dementia,, not in obvious distress, spoke with a single word HENT:   No scleral pallor or icterus noted. Oral mucosa is moist.  Chest:  Clear breath sounds.  Diminished breath sounds bilaterally. No crackles or wheezes.  CVS: S1 &S2 heard. No murmur.  Regular rate and rhythm. Abdomen: Soft, nontender, nondistended.  Bowel sounds are heard.   Extremities: No cyanosis, clubbing or edema.  Peripheral pulses are palpable. Psych: Alert, awake and underlying dementia. CNS:  No cranial nerve deficits.  Moving extremities Skin: Warm and dry.  No rashes noted.  Skin turgor has improved  Data Review: I have personally reviewed the following laboratory data and studies,  CBC: Recent Labs  Lab 07/31/20 1733 07/31/20 1733 08/01/20 0430 08/01/20 0443 08/02/20  0216 08/03/20 0915 08/04/20 0322  WBC 10.6*  --  10.6*  --  7.5 7.6 6.7  NEUTROABS 7.6  --   --   --   --   --   --   HGB 15.6   < > 15.8 16.3 13.4 14.7 13.1  HCT 50.3   < > 50.8 48.0 41.8 45.7 39.3  MCV 90.8  --  90.6  --  87.6 87.2 86.4  PLT 174  --  174  --  112* 114* 96*   < > = values in this interval not displayed.   Basic Metabolic Panel: Recent Labs  Lab 08/01/20 0430 08/01/20 0443 08/01/20 1242 08/01/20 1658 08/02/20 0216 08/03/20 0915 08/04/20 0322  NA 158*   < > 153* 152* 151* 147* 142  K 3.5   < > 3.8 3.8 3.9 4.2 3.6  CL 118*   < > 121* 117* 119* 111 110  CO2 28   < > 24 27 18* 28 23  GLUCOSE 93   < > 144* 178* 197* 198* 195*  BUN 46*   < > 40* 39* 36* 29* 28*  CREATININE 1.46*   < > 1.23 1.23 1.12 1.18 1.00  CALCIUM 10.2    < > 9.1 9.2 8.8* 9.2 8.7*  MG 2.6*  --   --   --  2.1 2.1 1.9  PHOS  --   --   --   --  3.7 4.1 3.9   < > = values in this interval not displayed.   Liver Function Tests: Recent Labs  Lab 07/31/20 1733 08/02/20 0216 08/03/20 0915 08/04/20 0322  AST _0 13*  ALT 40 _1 ALKPHOS 98 65 85 69  BILITOT 1.6* 1.3* 1.7* 1.0  PROT 7.1 5.3* 6.0* 5.2*  ALBUMIN 4.0 2.8* 3.2* 2.9*   No results for input(s): LIPASE, AMYLASE in the last 168 hours. Recent Labs  Lab 07/31/20 1733  AMMONIA <9*   Cardiac Enzymes: No results for input(s): CKTOTAL, CKMB, CKMBINDEX, TROPONINI in the last 168 hours. BNP (last 3 results) No results for input(s): BNP in the last 8760 hours.  ProBNP (last 3 results) No results for input(s): PROBNP in the last 8760 hours.  CBG: Recent Labs  Lab 08/03/20 0855 08/03/20 1153 08/03/20 1614 08/03/20 2222 08/04/20 0554  GLUCAP 162* 177* 219* 147* 172*   Recent Results (from the past 240 hour(s))  Urine culture     Status: Abnormal   Collection Time: 07/31/20  4:51 PM   Specimen: Urine, Random  Result Value Ref Range Status   Specimen Description URINE, RANDOM  Final   Special Requests   Final    NONE Performed at [facility], [place] [address] [place], [place] [address]    Culture >=100,000 COLONIES/mL ENTEROCOCCUS FAECALIS (A)  Final   Report Status 08/03/2020 FINAL  Final   Organism ID, Bacteria ENTEROCOCCUS FAECALIS (A)  Final      Susceptibility   Enterococcus faecalis - MIC*    AMPICILLIN <=2 SENSITIVE Sensitive     NITROFURANTOIN 64 INTERMEDIATE Intermediate     VANCOMYCIN 1 SENSITIVE Sensitive     * >=100,000 COLONIES/mL ENTEROCOCCUS FAECALIS  Respiratory Panel by RT PCR (Flu A&B, Covid) - Nasopharyngeal Swab     Status: None   Collection Time: 07/31/20  7:21 PM   Specimen: Nasopharyngeal Swab  Result Value Ref Range Status   SARS Coronavirus 2 by RT PCR NEGATIVE NEGATIVE Final  Comment: (NOTE) SARS-CoV-2 target nucleic  acids are NOT DETECTED.  The SARS-CoV-2 RNA is generally detectable in upper respiratoy specimens during the acute phase of infection. The lowest concentration of SARS-CoV-2 viral copies this assay can detect is 131 copies/mL. A negative result does not preclude SARS-Cov-2 infection and should not be used as the sole basis for treatment or other patient management decisions. A negative result may occur with  improper specimen collection/handling, submission of specimen other than nasopharyngeal swab, presence of viral mutation(s) within the areas targeted by this assay, and inadequate number of viral copies (<131 copies/mL). A negative result must be combined with clinical observations, patient history, and epidemiological information. The expected result is Negative.  Fact Sheet for Patients:  PinkCheek.be  Fact Sheet for Healthcare Providers:  GravelBags.it  This test is no t yet approved or cleared by the [place] FDA and  has been authorized for detection and/or diagnosis of SARS-CoV-2 by FDA under an Emergency Use Authorization (EUA). This EUA will remain  in effect (meaning this test can be used) for the duration of the COVID-19 declaration under Section 564(b)(1) of the Act, 21 U.S.C. section 360bbb-3(b)(1), unless the authorization is terminated or revoked sooner.     Influenza A by PCR NEGATIVE NEGATIVE Final   Influenza B by PCR NEGATIVE NEGATIVE Final    Comment: (NOTE) The Xpert Xpress SARS-CoV-2/FLU/RSV assay is intended as an aid in  the diagnosis of influenza from Nasopharyngeal swab specimens and  should not be used as a sole basis for treatment. Nasal washings and  aspirates are unacceptable for Xpert Xpress SARS-CoV-2/FLU/RSV  testing.  Fact Sheet for Patients: PinkCheek.be  Fact Sheet for Healthcare Providers: GravelBags.it  This test  is not yet approved or cleared by the [place] FDA and  has been authorized for detection and/or diagnosis of SARS-CoV-2 by  FDA under an Emergency Use Authorization (EUA). This EUA will remain  in effect (meaning this test can be used) for the duration of the  Covid-19 declaration under Section 564(b)(1) of the Act, 21  U.S.C. section 360bbb-3(b)(1), unless the authorization is  terminated or revoked. Performed at [facility], [place] [address] [place], [place] [address]   MRSA PCR Screening     Status: None   Collection Time: 08/01/20 10:32 AM   Specimen: Nasal Mucosa; Nasopharyngeal  Result Value Ref Range Status   MRSA by PCR NEGATIVE NEGATIVE Final    Comment:        The GeneXpert MRSA Assay (FDA approved for NASAL specimens only), is one component of a comprehensive MRSA colonization surveillance program. It is not intended to diagnose MRSA infection nor to guide or monitor treatment for MRSA infections. Performed at [facility], [place] [address] [place], [place] [address]      Studies: No results found.   Flora Lipps, MD  [facility] 08/04/2020

## 2020-08-05 DIAGNOSIS — E1100 Type 2 diabetes mellitus with hyperosmolarity without nonketotic hyperglycemic-hyperosmolar coma (NKHHC): Secondary | ICD-10-CM | POA: Diagnosis not present

## 2020-08-05 DIAGNOSIS — E1165 Type 2 diabetes mellitus with hyperglycemia: Secondary | ICD-10-CM | POA: Diagnosis not present

## 2020-08-05 LAB — GLUCOSE, CAPILLARY
Glucose-Capillary: 144 mg/dL — ABNORMAL HIGH (ref 70–99)
Glucose-Capillary: 299 mg/dL — ABNORMAL HIGH (ref 70–99)
Glucose-Capillary: 128 mg/dL — ABNORMAL HIGH (ref 70–99)

## 2020-08-05 LAB — BASIC METABOLIC PANEL
Anion gap: 7 (ref 5–15)
BUN: 24 mg/dL — ABNORMAL HIGH (ref 8–23)
CO2: 23 mmol/L (ref 22–32)
Calcium: 8.6 mg/dL — ABNORMAL LOW (ref 8.9–10.3)
Chloride: 111 mmol/L (ref 98–111)
Creatinine, Ser: 1.02 mg/dL (ref 0.61–1.24)
GFR, Estimated: >60 mL/min (ref >60)
Glucose, Bld: 146 mg/dL — ABNORMAL HIGH (ref 70–99)
Potassium: 3.7 mmol/L (ref 3.5–5.1)
Sodium: 141 mmol/L (ref 135–145)

## 2020-08-05 LAB — CBC
HCT: 38.6 % — ABNORMAL LOW (ref 39.0–52.0)
Hemoglobin: 12.8 g/dL — ABNORMAL LOW (ref 13.0–17.0)
MCH: 28.1 pg (ref 26.0–34.0)
MCHC: 33.2 g/dL (ref 30.0–36.0)
MCV: 84.6 fL (ref 80.0–100.0)
Platelets: 107 10*3/uL — ABNORMAL LOW (ref 150–400)
RBC: 4.56 MIL/uL (ref 4.22–5.81)
RDW: 13.7 % (ref 11.5–15.5)
WBC: 7.1 10*3/uL (ref 4.0–10.5)
nRBC: 0 % (ref 0.0–0.2)

## 2020-08-05 MED ORDER — INSULIN GLARGINE 100 UNIT/ML ~~LOC~~ SOLN: 10.0000 [IU] | Freq: Every day | SUBCUTANEOUS | Status: DC

## 2020-08-05 MED ORDER — INSULIN GLARGINE 100 UNIT/ML ~~LOC~~ SOLN
10.0000 [IU] | Freq: Every day | SUBCUTANEOUS | Status: DC
  Administered 2020-08-06: 10 [IU] via SUBCUTANEOUS
  Filled 2020-08-05 (×2): qty 0.1

## 2020-08-05 MED ORDER — INSULIN ASPART 100 UNIT/ML ~~LOC~~ SOLN: 0.0000 [IU] | Freq: Every day | SUBCUTANEOUS | Status: DC

## 2020-08-05 MED ORDER — INSULIN ASPART 100 UNIT/ML ~~LOC~~ SOLN
0.0000 [IU] | Freq: Three times a day (TID) | SUBCUTANEOUS | Status: DC
  Administered 2020-08-06: 5 [IU] via SUBCUTANEOUS
  Administered 2020-08-06: 3 [IU] via SUBCUTANEOUS
  Administered 2020-08-06: 2 [IU] via SUBCUTANEOUS

## 2020-08-05 MED ORDER — HALOPERIDOL 1 MG PO TABS
2.0000 mg | ORAL_TABLET | Freq: Three times a day (TID) | ORAL | Status: DC | PRN
  Administered 2020-08-05: 2 mg via ORAL
  Filled 2020-08-05: qty 2

## 2020-08-05 NOTE — Plan of Care (Signed)
  Problem: Pain Managment: Goal: General experience of comfort will improve Outcome: Progressing   Problem: Safety: Goal: Ability to remain free from injury will improve Outcome: Progressing   Problem: Skin Integrity: Goal: Risk for impaired skin integrity will decrease Outcome: Progressing   

## 2020-08-05 NOTE — Plan of Care (Signed)
  Problem: Education: Goal: Knowledge of General Education information will improve Description: Including pain rating scale, medication(s)/side effects and non-pharmacologic comfort measures Outcome: Not Progressing Note: Patient with advanced dementia

## 2020-08-05 NOTE — Progress Notes (Signed)
PROGRESS NOTE  Dean Harris:[id number] DOB: June 02, 1941 DOA: 07/31/2020 PCP: Chevis Pretty, FNP   LOS: 3 days   Brief narrative:  Dean Harris is a 79 y.o. male with medical history significant for chronic stable angina, type 2 diabetes mellitus, hypertension, PTSD/depression, and dementia with behavioral disturbances presented to the ED from [facility] care unit for evaluation of lethargy and hyperglycemia. Patient was unable to provide history due to baseline dementia. Granddaughter stated that at baseline, patient has dementia and is essentially nonverbal. He is minimally interactive and will occasionally has mumbled speech. He has been noted to have angry and violent behavior. He has been residing at the [facility] care unit for the last 4 months. Patient was reported to have been recently treated for UTI with ciprofloxacin from 10/29 >> 11/5. [facility] staff reported that he was having shouting outbursts over the weekend. He has been noted to have increased urinary frequency. Blood sugar was checked and was noted to be significantly elevated. He was sent to the ED for further evaluation.   In the ED, his labs were notable for severe hyperglycemia with serum glucose level of   779, sodium 149 (165 when corrected for hyperglycemia), potassium 5.2, bicarb 24, BUN 50, creatinine 1.87 (1.11 on 07/15/2020), anion gap 16, AST 24, ALT 40, alk phos 98, total bilirubin 1.6, WBC 10.6, hemoglobin 15.6, platelets 174,000, ammonia <9, serum ethanol <10.  Urinalysis showed negative ketones but glycosuria with 21-50 white cells.CT head without contrast was negative for acute intracranial abnormality, changes consistent with age-related atrophy and chronic small vessel ischemia noted.  Portable chest x-ray was negative for focal consolidation, edema, or effusion. Patient was given 1.5 L normal saline and the hospitalist service was consulted to admit for further evaluation  and management.   Assessment/Plan:  Principal Problem:   Type 2 diabetes mellitus with hyperosmolar hyperglycemic state (HHS) ([place]) Active Problems:   Coronary artery disease, non-occlusive   Chronic stable angina (HCC)   AKI (acute kidney injury) ([place])   Dementia with behavioral disturbance (HCC)   Hypertension associated with diabetes ([place])   Hypernatremia   Hyperkalemia   Pressure injury of skin   Hyperosmolar hyperglycemic state (HHS) ([place])  Uncontrolled type 2 diabetes mellitus with hyperosmolar hyperglycemic state: On presentation, serum glucose was 779 without acidosis or ketonuria.  Patient was started on IV insulin drip and fluids.  Patient also received D5 water due to hypernatremia.  Improving hypernatremia at this time.  Sodium of 142.  Off IV fluids at this time.  Encourage oral intake.  Continue  long-acting and sliding scale insulin at this time. Hemoglobin A1c of 11.6 on 08/01/2020.   Acute kidney injury: secondary to volume depletion.  Creatinine of 1.0 at this time.  Improved with IV hydration.  Encourage oral intake at this time.  Off IV fluids.  Hypernatremia: Corrected sodium on presentation was 165 in setting of dehydration.  Improved sodium levels at this time.  Increase oral fluids.  Hyperkalemia: Improved after insulin and fluid administration.    Essential hypertension: Patient was on lisinopril at home.  Lisinopril currently on hold due to AKI and volume depletion.  On hydralazine as needed for now.  Blood pressure is 115/43 at this time.  Resume lisinopril when appropriate.   CAD with chronic stable angina: Resumed Imdur.  Not on antiplatelet or statin at home.   History of subdural hematoma: Status post fall -patient had hematoma on 05/10/20 with 3 to 4 mm  subdural hematoma.  It was treated conservatively.  He also completed 7-day course of Keppra for seizure prophylaxis at that time.  CT he did scan at this time without any hemorrhage or hematoma.      Dementia with behavioral disturbance/depression/PTSD: Currently at [facility] care unit.  At baseline, patient is nonverbal and noncooperative with some behavioral outbursts.  Continue Aricept, Namenda, BuSpar, Zoloft.  Guarded prognosis in the future.  This has been explained to the patient's granddaughter in detail. As needed Haldol.   Pressure injury to coccyx, present on admission.  Stage I.  Continue pressure ulcer prevention protocol.  Pressure Injury 08/01/20 Coccyx Medial Stage 1 -  Intact skin with non-blanchable redness of a localized area usually over a bony prominence. (Active)  08/01/20 1029  Location: Coccyx  Location Orientation: Medial  Staging: Stage 1 -  Intact skin with non-blanchable redness of a localized area usually over a bony prominence.  Wound Description (Comments):   Present on Admission: Yes     DVT prophylaxis:    Code Status: Full code  Family Communication: No family at bedside.  Status is: Inpatient  The patient is  inpatient because: Unsafe discharge.  Dispo: The patient is from: [facility] assisted living facility.              Anticipated d/c is to: Assisted living facility.                Anticipated d/c date is: 1 to 2 days , [facility] unable to take patients over the weekend as per transition of care           Patient currently is medically stable for discharge  Consultants:  None  Procedures:  None  Antibiotics:  . None  Anti-infectives (From admission, onward)   None     Subjective: Alert and awake.  Appears agitated.  No nausea no vomiting.  Later in the day spit all the food when RN was trying to feed him.  Objective: Vitals:   08/05/20 1100 08/05/20 1648  BP: 95/60 110/67  Pulse: 76   Resp: 10 15  Temp: 98.6 F (37 C) 97.8 F (36.6 C)  SpO2:      Intake/Output Summary (Last 24 hours) at 08/05/2020 2048 Last data filed at 08/05/2020 1030 Gross per 24 hour  Intake 760 ml  Output 350 ml   Net 410 ml   There were no vitals filed for this visit. There is no height or weight on file to calculate BMI.   Physical Exam:  General: Appear in mild distress, no Rash; Oral Mucosa Clear, moist. no Abnormal Neck Mass Or lumps, Conjunctiva normal  Cardiovascular: S1 and S2 Present, no Murmur, Respiratory: good respiratory effort, Bilateral Air entry present and CTA, no Crackles, no wheezes Abdomen: Bowel Sound present, Soft and no tenderness Extremities: no Pedal edema Neurology: alert and not oriented to time, place, and person affect emotionally labile. no new focal deficit Gait not checked due to patient safety concerns   Data Review: I have personally reviewed the following laboratory data and studies,  CBC: Recent Labs  Lab 07/31/20 1733 07/31/20 1733 08/01/20 0430 08/01/20 0430 08/01/20 0443 08/02/20 0216 08/03/20 0915 08/04/20 0322 08/05/20 0336  WBC 10.6*   < > 10.6*  --   --  7.5 7.6 6.7 7.1  NEUTROABS 7.6  --   --   --   --   --   --   --   --  HGB 15.6   < > 15.8   < > 16.3 13.4 14.7 13.1 12.8*  HCT 50.3   < > 50.8   < > 48.0 41.8 45.7 39.3 38.6*  MCV 90.8   < > 90.6  --   --  87.6 87.2 86.4 84.6  PLT 174   < > 174  --   --  112* 114* 96* 107*   < > = values in this interval not displayed.   Basic Metabolic Panel: Recent Labs  Lab 08/01/20 0430 08/01/20 0443 08/01/20 1658 08/02/20 0216 08/03/20 0915 08/04/20 0322 08/05/20 0336  NA 158*   < > 152* 151* 147* 142 141  K 3.5   < > 3.8 3.9 4.2 3.6 3.7  CL 118*   < > 117* 119* 111 110 111  CO2 28   < > 27 18* $Remo'28 23 23  'inVZn$ GLUCOSE 93   < > 178* 197* 198* 195* 146*  BUN 46*   < > 39* 36* 29* 28* 24*  CREATININE 1.46*   < > 1.23 1.12 1.18 1.00 1.02  CALCIUM 10.2   < > 9.2 8.8* 9.2 8.7* 8.6*  MG 2.6*  --   --  2.1 2.1 1.9  --   PHOS  --   --   --  3.7 4.1 3.9  --    < > = values in this interval not displayed.   Liver Function Tests: Recent Labs  Lab 07/31/20 1733 08/02/20 0216 08/03/20 0915  08/04/20 0322  AST $Re'24 27 18 'wkB$ 13*  ALT 40 $Remo'25 24 17  'vWNYW$ ALKPHOS 98 65 85 69  BILITOT 1.6* 1.3* 1.7* 1.0  PROT 7.1 5.3* 6.0* 5.2*  ALBUMIN 4.0 2.8* 3.2* 2.9*   No results for input(s): LIPASE, AMYLASE in the last 168 hours. Recent Labs  Lab 07/31/20 1733  AMMONIA <9*   Cardiac Enzymes: No results for input(s): CKTOTAL, CKMB, CKMBINDEX, TROPONINI in the last 168 hours. BNP (last 3 results) No results for input(s): BNP in the last 8760 hours.  ProBNP (last 3 results) No results for input(s): PROBNP in the last 8760 hours.  CBG: Recent Labs  Lab 08/04/20 1212 08/04/20 1631 08/04/20 2114 08/05/20 0656 08/05/20 1154  GLUCAP 213* 137* 83 144* 299*   Recent Results (from the past 240 hour(s))  Urine culture     Status: Abnormal   Collection Time: 07/31/20  4:51 PM   Specimen: Urine, Random  Result Value Ref Range Status   Specimen Description URINE, RANDOM  Final   Special Requests   Final    NONE Performed at [facility], [place] [address] [place], [place] [address]    Culture >=100,000 COLONIES/mL ENTEROCOCCUS FAECALIS (A)  Final   Report Status 08/03/2020 FINAL  Final   Organism ID, Bacteria ENTEROCOCCUS FAECALIS (A)  Final      Susceptibility   Enterococcus faecalis - MIC*    AMPICILLIN <=2 SENSITIVE Sensitive     NITROFURANTOIN 64 INTERMEDIATE Intermediate     VANCOMYCIN 1 SENSITIVE Sensitive     * >=100,000 COLONIES/mL ENTEROCOCCUS FAECALIS  Respiratory Panel by RT PCR (Flu A&B, Covid) - Nasopharyngeal Swab     Status: None   Collection Time: 07/31/20  7:21 PM   Specimen: Nasopharyngeal Swab  Result Value Ref Range Status   SARS Coronavirus 2 by RT PCR NEGATIVE NEGATIVE Final    Comment: (NOTE) SARS-CoV-2 target nucleic acids are NOT DETECTED.  The SARS-CoV-2 RNA is generally detectable in upper respiratoy specimens  during the acute phase of infection. The lowest concentration of SARS-CoV-2 viral copies this assay can detect is 131 copies/mL. A negative  result does not preclude SARS-Cov-2 infection and should not be used as the sole basis for treatment or other patient management decisions. A negative result may occur with  improper specimen collection/handling, submission of specimen other than nasopharyngeal swab, presence of viral mutation(s) within the areas targeted by this assay, and inadequate number of viral copies (<131 copies/mL). A negative result must be combined with clinical observations, patient history, and epidemiological information. The expected result is Negative.  Fact Sheet for Patients:  PinkCheek.be  Fact Sheet for Healthcare Providers:  GravelBags.it  This test is no t yet approved or cleared by the [place] FDA and  has been authorized for detection and/or diagnosis of SARS-CoV-2 by FDA under an Emergency Use Authorization (EUA). This EUA will remain  in effect (meaning this test can be used) for the duration of the COVID-19 declaration under Section 564(b)(1) of the Act, 21 U.S.C. section 360bbb-3(b)(1), unless the authorization is terminated or revoked sooner.     Influenza A by PCR NEGATIVE NEGATIVE Final   Influenza B by PCR NEGATIVE NEGATIVE Final    Comment: (NOTE) The Xpert Xpress SARS-CoV-2/FLU/RSV assay is intended as an aid in  the diagnosis of influenza from Nasopharyngeal swab specimens and  should not be used as a sole basis for treatment. Nasal washings and  aspirates are unacceptable for Xpert Xpress SARS-CoV-2/FLU/RSV  testing.  Fact Sheet for Patients: PinkCheek.be  Fact Sheet for Healthcare Providers: GravelBags.it  This test is not yet approved or cleared by the [place] FDA and  has been authorized for detection and/or diagnosis of SARS-CoV-2 by  FDA under an Emergency Use Authorization (EUA). This EUA will remain  in effect (meaning this test can be used)  for the duration of the  Covid-19 declaration under Section 564(b)(1) of the Act, 21  U.S.C. section 360bbb-3(b)(1), unless the authorization is  terminated or revoked. Performed at [facility], [place] [address], [place], [place] [address]   MRSA PCR Screening     Status: None   Collection Time: 08/01/20 10:32 AM   Specimen: Nasal Mucosa; Nasopharyngeal  Result Value Ref Range Status   MRSA by PCR NEGATIVE NEGATIVE Final    Comment:        The GeneXpert MRSA Assay (FDA approved for NASAL specimens only), is one component of a comprehensive MRSA colonization surveillance program. It is not intended to diagnose MRSA infection nor to guide or monitor treatment for MRSA infections. Performed at [facility], [place] [address] [place],  [address]      Studies: No results found.   Berle Mull, MD  [facility] 08/05/2020

## 2020-08-05 NOTE — Progress Notes (Signed)
RN attempting to feed patient lunch, after approx 10 bites patient fviolently spit all mouthful of food into writer's face hair and about the room.  Patient blanket changed for a clean on and feeding session ended

## 2020-08-06 DIAGNOSIS — E1100 Type 2 diabetes mellitus with hyperosmolarity without nonketotic hyperglycemic-hyperosmolar coma (NKHHC): Secondary | ICD-10-CM | POA: Diagnosis not present

## 2020-08-06 DIAGNOSIS — E1165 Type 2 diabetes mellitus with hyperglycemia: Secondary | ICD-10-CM | POA: Diagnosis not present

## 2020-08-06 LAB — RESPIRATORY PANEL BY RT PCR (FLU A&B, COVID)
Influenza A by PCR: NEGATIVE
Influenza B by PCR: NEGATIVE
SARS Coronavirus 2 by RT PCR: NEGATIVE

## 2020-08-06 LAB — GLUCOSE, CAPILLARY
Glucose-Capillary: 160 mg/dL — ABNORMAL HIGH (ref 70–99)
Glucose-Capillary: 140 mg/dL — ABNORMAL HIGH (ref 70–99)
Glucose-Capillary: 234 mg/dL — ABNORMAL HIGH (ref 70–99)

## 2020-08-06 MED ORDER — INSULIN GLARGINE 100 UNIT/ML ~~LOC~~ SOLN: 10.0000 [IU] | Freq: Every day | SUBCUTANEOUS | 0 refills | Status: AC

## 2020-08-06 MED ORDER — "INSULIN SYRINGE 31G X 5/16"" 0.3 ML MISC": 1.0000 | Freq: Every day | 0 refills | Status: AC

## 2020-08-06 NOTE — Progress Notes (Signed)
Pt transferred from 4NP11 via [facility]. VS stable, IV removed. Belongings and paperwork taken with pt.

## 2020-08-06 NOTE — TOC Transition Note (Signed)
Transition of Care [facility]) - CM/SW Discharge Note   Patient Details  Name: Dean Harris MRN: [id number] Date of Birth: 09-08-1941  [facility] CM/SW Contact:  Eduard Roux, LCSWA Phone Number: 08/06/2020, 1:42 PM   Clinical Narrative:     Patient will DC to: [facility] DC Date: 08/06/2020 Family Notified: Ashley,granddaughter Transport [id number]   Per MD patient is ready for discharge. RN, patient, and facility notified of DC. Discharge Summary sent to facility. RN given number for [phone]. Ambulance transport requested for patient.   Clinical Social Worker signing off.  Antony Blackbird, MSW, LCSWA Clinical Social Worker   Final next level of care: Assisted Living Barriers to Discharge: Barriers Resolved   Patient Goals and CMS Choice        Discharge Placement              Patient chooses bed at: [facility] Patient to be transferred to facility by: [facility] Name of family member notified: Ashelky,granddaughter Patient and family notified of of transfer: 08/06/20  Discharge Plan and Services In-house Referral: Clinical Social Work                                   Social Determinants of Health (SDOH) Interventions     Readmission Risk Interventions No flowsheet data found.

## 2020-08-06 NOTE — Discharge Summary (Signed)
[facility] Discharge Summary   Patient: Dean Harris SKA:[id number]  PCP: Chevis Pretty, FNP  Date of admission: 07/31/2020   Date of discharge:  08/06/2020     Discharge Diagnoses:  Principal Problem:   Type 2 diabetes mellitus with hyperosmolar hyperglycemic state (HHS) ([place]) Active Problems:   Coronary artery disease, non-occlusive   Chronic stable angina ([place])   AKI (acute kidney injury) ([place])   Dementia with behavioral disturbance ([place])   Hypertension associated with diabetes ([place])   Hypernatremia   Hyperkalemia   Pressure injury of skin   Hyperosmolar hyperglycemic state (HHS) ([place])  Admitted From: ALF Disposition:  ALF/ILF   Recommendations for Outpatient Follow-up:  1. PCP: follow up with PCP in 1 week, decide between insulin therapy vs oral hypoglycemic agents 2. Follow up LABS/TEST:  none   Follow-up Information    Chevis Pretty, FNP. Schedule an appointment as soon as possible for a visit in 1 week(s).   Specialty: Family Medicine Contact information: [place] [facility] [address] [phone]              Diet recommendation: Carb modified diet  Activity: The patient is advised to gradually reintroduce usual activities, as tolerated  Discharge Condition: stable  Code Status: Full code   History of present illness: As per the H and P dictated on admission, "Dean Harris is a 79 y.o. male with medical history significant for CAD with chronic stable angina, type 2 diabetes, hypertension, PTSD/depression, and dementia with behavioral disturbance who presents to the ED from [facility] for evaluation of lethargy and hyperglycemia. Patient is unable to provide history due to baseline dementia and is otherwise obtained from EDP, chart review, and his granddaughter Gretta Cool by phone.  Granddaughter states that at baseline, patient has dementia and is essentially nonverbal. He is minimally interactive and will  occasionally have mumbled speech. He has been noted to have angry and violent behavior. He has been residing at the [facility] care unit for the last 4 months. INR is husband went to see him today and noticed that he seemed to be more lethargic than usual. He is reported to have been recently treated for UTI with ciprofloxacin from 10/29 >> 11/5. [facility] staff reported that he was having shouting outbursts over the weekend. He has been noted to have increased urinary frequency. Blood sugar was checked and was noted to be significantly elevated. He was sent to the ED for further evaluation.  Prior to moving to [facility] care unit, patient was residing with granddaughter. He had been prediabetic with blood sugars well controlled in the 120s for a long period of time while adhering to a diabetic diet. He did not require medication for management of diabetes.  ED Course:  Initial vitals showed BP 127/59, pulse 103, RR 16, temp 98.4 Fahrenheit, SPO2 97% on room air.  Labs notable for serum glucose 779, sodium 149 (165 when corrected for hyperglycemia), potassium 5.2, bicarb 24, BUN 50, creatinine 1.87 (1.11 on 07/15/2020), anion gap 16, AST 24, ALT 40, alk phos 98, total bilirubin 1.6, WBC 10.6, hemoglobin 15.6, platelets 174,000, ammonia <9, serum ethanol <10.  Urinalysis shows >500 glucose, negative ketones, negative protein, negative nitrites, moderate leukocytes, 11-20 RBC/hpf, 21-50 WBC/hpf, rare bacteria on microscopy.  Urine culture was obtained and pending.  SARS-CoV-2 PCR panel is collected and pending.  CT head without contrast was negative for acute intracranial abnormality.  Changes consistent with age-related atrophy and chronic  small vessel ischemia noted.  Portable chest x-ray is negative for focal consolidation, edema, or effusion.  Patient was given 1.5 L normal saline and the hospitalist service was consulted to admit for further evaluation and  management."  Hospital Course:  Summary of his active problems in the hospital is as following.  Uncontrolled type 2 diabetes mellitus with hyperosmolar hyperglycemic state: On presentation, serum glucose was 779 without acidosis or ketonuria.   Patient was started on IV insulin drip and fluids.   Improving hypernatremia at this time.   Continue  long-acting lantus for now although oral hypoglycemic may be better in long term due to pt dementia. Monitor.   Hemoglobin A1c of 11.6 on 08/01/2020.  Acute kidney injury: secondary to volume depletion.  Creatinine of 1.0 at this time. Improved with IV hydration.   Encourage oral intake at this time.  Off IV fluids.  Hypernatremia: Corrected sodium on presentation was 165 in setting of dehydration. Improved sodium levels at this time.  Increase oral fluids.  Hyperkalemia: Improved after insulin and fluid administration.   Essential hypertension: Patient was on lisinopril at home.   Lisinopril currently on hold due to AKI and volume depletion.   Resume lisinopril when appropriate.  CAD with chronic stable angina: On Imdur. Hold due to normal BP Not on antiplatelet or statin at home due to fall risk.  History of subdural hematoma: Status post fall  -patient had hematoma on 05/10/20 with 3 to 4 mm subdural hematoma.   treated conservatively.   He also completed 7-day course of Keppra for seizure prophylaxis at that time.   CT he did scan at this time without any hemorrhage or hematoma.    Dementia with behavioral disturbance/depression/PTSD: Currently at [facility] care unit.  At baseline, patient is nonverbal and noncooperative with some behavioral outbursts.  Continue Aricept, Namenda, BuSpar, Zoloft.  Guarded prognosis in the future.  This has been explained to the patient's granddaughter in detail. As needed Haldol.   Pressure injury to coccyx, present on admission.  Stage I.  Continue pressure ulcer prevention  protocol.  Pressure Injury 08/01/20 Coccyx Medial Stage 1 -  Intact skin with non-blanchable redness of a localized area usually over a bony prominence. (Active)  08/01/20 1029  Location: Coccyx  Location Orientation: Medial  Staging: Stage 1 -  Intact skin with non-blanchable redness of a localized area usually over a bony prominence.  Wound Description (Comments):   Present on Admission: Yes    Patient was seen by physical therapy, who recommended no therapy needed on discharge. On the day of the discharge the patient's vitals were stable, and no other acute medical condition were reported by patient. The patient was felt safe to be discharge at ALF/ILF with no therapy needed on discharge.  Consultants: none Procedures: none  Discharge Exam: General: Appear in no distress, no Rash; Oral Mucosa Clear, moist. no Abnormal Neck Mass Or lumps, Conjunctiva normal  Cardiovascular: S1 and S2 Present, no Murmur Respiratory: good respiratory effort, Bilateral Air entry present and CTA, no Crackles, no wheezes Abdomen: Bowel Sound present, Soft and no tenderness Extremities: no Pedal edema Neurology: alert and not oriented to time, place, and person affect emotionally labile. no new focal deficit  There were no vitals filed for this visit. Vitals:   08/06/20 1000 08/06/20 1100  BP: (!) 100/43 (!) 103/41  Pulse: (!) 56 60  Resp: 19 11  Temp:  97.7 F (36.5 C)  SpO2:  99%  DISCHARGE MEDICATION: Allergies as of 08/06/2020      Reactions   Dust Mite Extract Other (See Comments), Cough   Sneezing, also   Mold Extract [trichophyton] Other (See Comments)   Sneezing, also   Pollen Extract Other (See Comments)   Sneezing, also   Tetracyclines & Related Other (See Comments)   Generic only (??)      Medication List    STOP taking these medications   isosorbide mononitrate 30 MG 24 hr tablet Commonly known as: IMDUR   lisinopril 2.5 MG tablet Commonly known as: ZESTRIL       TAKE these medications   acetaminophen 500 MG tablet Commonly known as: TYLENOL Take 500 mg by mouth every 6 (six) hours as needed (for fever 99.5-101 F, minor headaches or discomfort- report any fever >101 F, severe headache or discomfort).   busPIRone 10 MG tablet Commonly known as: BUSPAR Take 10 mg by mouth 2 (two) times daily as needed (for anxiety).   Dermacloud Oint Apply 1 application topically See admin instructions. Apply topically every 3 hours- with every incontinent change   donepezil 10 MG tablet Commonly known as: ARICEPT TAKE 1/2 (ONE-HALF) TABLET BY MOUTH AT BEDTIME (Needs to be seen before next refill) What changed:   how much to take  how to take this  when to take this  additional instructions   insulin glargine 100 UNIT/ML injection Commonly known as: LANTUS Inject 0.1 mLs (10 Units total) into the skin daily. Start taking on: August 07, 2020   INSULIN SYRINGE .3CC/31GX5/16" 31G X 5/16" 0.3 ML Misc 1 Act by Does not apply route daily.   loperamide 2 MG capsule Commonly known as: IMODIUM Take 2 mg by mouth as needed (with each loose stool/diarrhea- Max of 8 doses/24 hours- REPORT ANY BLOOD).   memantine 10 MG tablet Commonly known as: NAMENDA Take 1 tablet (10 mg total) by mouth 2 (two) times daily.   Mi-Acid 200-200-20 MG/5ML suspension Generic drug: alum & mag hydroxide-simeth Take 30 mLs by mouth every 6 (six) hours as needed for indigestion or heartburn.   Milk of Magnesia 400 MG/5ML suspension Generic drug: magnesium hydroxide Take 30 mLs by mouth at bedtime as needed for mild constipation.   nitroGLYCERIN 0.4 MG SL tablet Commonly known as: NITROSTAT Use 1 tabket under tongue at onset and may repeat every 5 minutes x2 What changed:   how much to take  how to take this  when to take this  reasons to take this  additional instructions   Remedy Calazime 0.4-20.5 % Pste Generic drug: Menthol-Zinc Oxide Apply 1 application  topically See admin instructions. Apply to the sacrum 3 times a day AND three times a day as needed after incontinence   Robafen 100 MG/5ML syrup Generic drug: guaifenesin Take 200 mg by mouth every 6 (six) hours as needed for cough.   sertraline 100 MG tablet Commonly known as: ZOLOFT Take 2 tablets (200 mg total) by mouth in the morning.   Travoprost (BAK Free) 0.004 % Soln ophthalmic solution Commonly known as: TRAVATAN Place 1 drop into both eyes at bedtime.   Triple Antibiotic [phone] Oint Apply 1 application topically See admin instructions. Apply to minor skin tears or abrasions after cleansing with normal saline- cover with Band-Aids or gauze & tape            Discharge Care Instructions  (From admission, onward)         Start     Ordered  08/06/20 0000  Discharge wound care:       Comments: Foam dressing   08/06/20 1144         Allergies  Allergen Reactions  . Dust Mite Extract Other (See Comments) and Cough    Sneezing, also  . Mold Extract [Trichophyton] Other (See Comments)    Sneezing, also  . Pollen Extract Other (See Comments)    Sneezing, also  . Tetracyclines & Related Other (See Comments)    Generic only (??)   Discharge Instructions    Diet - low sodium heart healthy   Complete by: As directed    Discharge wound care:   Complete by: As directed    Foam dressing   Increase activity slowly   Complete by: As directed       The results of significant diagnostics from this hospitalization (including imaging, microbiology, ancillary and laboratory) are listed below for reference.    Significant Diagnostic Studies: DG Chest 2 View  Result Date: 07/15/2020 CLINICAL DATA:  Fall EXAM: CHEST - 2 VIEW COMPARISON:  05/09/2020 FINDINGS: Stable mild interstitial prominence. No effusion pneumothorax. Stable cardiomediastinal contours with normal heart size. IMPRESSION: No acute process in the chest. Electronically Signed   By: Macy Mis  M.D.   On: 07/15/2020 13:48   DG Pelvis 1-2 Views  Result Date: 07/15/2020 CLINICAL DATA:  Dementia.  Fell. EXAM: PELVIS - 1-2 VIEW COMPARISON:  CT scan 01/10/2020 FINDINGS: Both hips are normally located. No acute hip or pelvic fractures are identified. The pubic symphysis and SI joints are intact. Extensive vascular calcifications. IMPRESSION: No acute bony findings. Electronically Signed   By: Marijo Sanes M.D.   On: 07/15/2020 13:50   CT Head Wo Contrast  Result Date: 07/31/2020 CLINICAL DATA:  Change in mental status EXAM: CT HEAD WITHOUT CONTRAST TECHNIQUE: Contiguous axial images were obtained from the base of the skull through the vertex without intravenous contrast. COMPARISON:  None. FINDINGS: Brain: No evidence of acute territorial infarction, hemorrhage, hydrocephalus,extra-axial collection or mass lesion/mass effect. There is dilatation the ventricles and sulci consistent with age-related atrophy. Low-attenuation changes in the deep white matter consistent with small vessel ischemia. Vascular: No hyperdense vessel or unexpected calcification. Skull: The skull is intact. No fracture or focal lesion identified. Sinuses/Orbits: The visualized paranasal sinuses and mastoid air cells are clear. The orbits and globes intact. Other: None IMPRESSION: No acute intracranial abnormality. Findings consistent with age related atrophy and chronic small vessel ischemia Electronically Signed   By: Prudencio Pair M.D.   On: 07/31/2020 17:43   CT Head Wo Contrast  Result Date: 07/15/2020 CLINICAL DATA:  Fall from standing with head and neck pain. EXAM: CT HEAD WITHOUT CONTRAST CT CERVICAL SPINE WITHOUT CONTRAST TECHNIQUE: Multidetector CT imaging of the head and cervical spine was performed following the standard protocol without intravenous contrast. Multiplanar CT image reconstructions of the cervical spine were also generated. COMPARISON:  CT head and cervical spine dated 05/10/2020. FINDINGS: CT HEAD  FINDINGS Brain: No evidence of acute infarction, hemorrhage, hydrocephalus, extra-axial collection or mass lesion/mass effect. There is moderate to severe cerebral volume loss with associated ex vacuo dilatation. Periventricular white matter hypoattenuation likely represents chronic small vessel ischemic disease. The previously seen subdural hematoma along the right cerebral convexity has since resolved. Vascular: There are vascular calcifications in the carotid siphons. Skull: Normal. Negative for fracture or focal lesion. Sinuses/Orbits: No acute finding. Other: None. CT CERVICAL SPINE FINDINGS Alignment: Normal. Skull base and vertebrae: No acute fracture. No  primary bone lesion or focal pathologic process. Soft tissues and spinal canal: No prevertebral fluid or swelling. No visible canal hematoma. Disc levels: Moderate multilevel degenerative disc and joint disease. Upper chest: Negative. Other: None. IMPRESSION: 1. No acute intracranial process. 2. No acute osseous injury in the cervical spine. Electronically Signed   By: Zerita Boers M.D.   On: 07/15/2020 14:48   CT Cervical Spine Wo Contrast  Result Date: 07/15/2020 CLINICAL DATA:  Fall from standing with head and neck pain. EXAM: CT HEAD WITHOUT CONTRAST CT CERVICAL SPINE WITHOUT CONTRAST TECHNIQUE: Multidetector CT imaging of the head and cervical spine was performed following the standard protocol without intravenous contrast. Multiplanar CT image reconstructions of the cervical spine were also generated. COMPARISON:  CT head and cervical spine dated 05/10/2020. FINDINGS: CT HEAD FINDINGS Brain: No evidence of acute infarction, hemorrhage, hydrocephalus, extra-axial collection or mass lesion/mass effect. There is moderate to severe cerebral volume loss with associated ex vacuo dilatation. Periventricular white matter hypoattenuation likely represents chronic small vessel ischemic disease. The previously seen subdural hematoma along the right cerebral  convexity has since resolved. Vascular: There are vascular calcifications in the carotid siphons. Skull: Normal. Negative for fracture or focal lesion. Sinuses/Orbits: No acute finding. Other: None. CT CERVICAL SPINE FINDINGS Alignment: Normal. Skull base and vertebrae: No acute fracture. No primary bone lesion or focal pathologic process. Soft tissues and spinal canal: No prevertebral fluid or swelling. No visible canal hematoma. Disc levels: Moderate multilevel degenerative disc and joint disease. Upper chest: Negative. Other: None. IMPRESSION: 1. No acute intracranial process. 2. No acute osseous injury in the cervical spine. Electronically Signed   By: Zerita Boers M.D.   On: 07/15/2020 14:48   DG Chest Port 1 View  Result Date: 07/31/2020 CLINICAL DATA:  Altered mental status. EXAM: PORTABLE CHEST 1 VIEW COMPARISON:  07/15/2020 FINDINGS: The cardiac silhouette, mediastinal and hilar contours are within normal limits and stable. Stable tortuosity and calcification of the thoracic aorta. No acute pulmonary findings. No infiltrates, edema or effusions. No pulmonary lesions. The bony thorax is intact. IMPRESSION: No acute cardiopulmonary findings. Electronically Signed   By: Marijo Sanes M.D.   On: 07/31/2020 18:46    Microbiology: Recent Results (from the past 240 hour(s))  Urine culture     Status: Abnormal   Collection Time: 07/31/20  4:51 PM   Specimen: Urine, Random  Result Value Ref Range Status   Specimen Description URINE, RANDOM  Final   Special Requests   Final    NONE Performed at [facility], [address], [place], [place] [address]    Culture >=100,000 COLONIES/mL ENTEROCOCCUS FAECALIS (A)  Final   Report Status 08/03/2020 FINAL  Final   Organism ID, Bacteria ENTEROCOCCUS FAECALIS (A)  Final      Susceptibility   Enterococcus faecalis - MIC*    AMPICILLIN <=2 SENSITIVE Sensitive     NITROFURANTOIN 64 INTERMEDIATE Intermediate     VANCOMYCIN 1 SENSITIVE Sensitive      * >=100,000 COLONIES/mL ENTEROCOCCUS FAECALIS  Respiratory Panel by RT PCR (Flu A&B, Covid) - Nasopharyngeal Swab     Status: None   Collection Time: 07/31/20  7:21 PM   Specimen: Nasopharyngeal Swab  Result Value Ref Range Status   SARS Coronavirus 2 by RT PCR NEGATIVE NEGATIVE Final    Comment: (NOTE) SARS-CoV-2 target nucleic acids are NOT DETECTED.  The SARS-CoV-2 RNA is generally detectable in upper respiratoy specimens during the acute phase of infection. The lowest concentration  of SARS-CoV-2 viral copies this assay can detect is 131 copies/mL. A negative result does not preclude SARS-Cov-2 infection and should not be used as the sole basis for treatment or other patient management decisions. A negative result may occur with  improper specimen collection/handling, submission of specimen other than nasopharyngeal swab, presence of viral mutation(s) within the areas targeted by this assay, and inadequate number of viral copies (<131 copies/mL). A negative result must be combined with clinical observations, patient history, and epidemiological information. The expected result is Negative.  Fact Sheet for Patients:  PinkCheek.be  Fact Sheet for Healthcare Providers:  GravelBags.it  This test is no t yet approved or cleared by the [place] FDA and  has been authorized for detection and/or diagnosis of SARS-CoV-2 by FDA under an Emergency Use Authorization (EUA). This EUA will remain  in effect (meaning this test can be used) for the duration of the COVID-19 declaration under Section 564(b)(1) of the Act, 21 U.S.C. section 360bbb-3(b)(1), unless the authorization is terminated or revoked sooner.     Influenza A by PCR NEGATIVE NEGATIVE Final   Influenza B by PCR NEGATIVE NEGATIVE Final    Comment: (NOTE) The Xpert Xpress SARS-CoV-2/FLU/RSV assay is intended as an aid in  the diagnosis of influenza from  Nasopharyngeal swab specimens and  should not be used as a sole basis for treatment. Nasal washings and  aspirates are unacceptable for Xpert Xpress SARS-CoV-2/FLU/RSV  testing.  Fact Sheet for Patients: PinkCheek.be  Fact Sheet for Healthcare Providers: GravelBags.it  This test is not yet approved or cleared by the [place] FDA and  has been authorized for detection and/or diagnosis of SARS-CoV-2 by  FDA under an Emergency Use Authorization (EUA). This EUA will remain  in effect (meaning this test can be used) for the duration of the  Covid-19 declaration under Section 564(b)(1) of the Act, 21  U.S.C. section 360bbb-3(b)(1), unless the authorization is  terminated or revoked. Performed at [facility], [place] [address] [place], [place] [address]   MRSA PCR Screening     Status: None   Collection Time: 08/01/20 10:32 AM   Specimen: Nasal Mucosa; Nasopharyngeal  Result Value Ref Range Status   MRSA by PCR NEGATIVE NEGATIVE Final    Comment:        The GeneXpert MRSA Assay (FDA approved for NASAL specimens only), is one component of a comprehensive MRSA colonization surveillance program. It is not intended to diagnose MRSA infection nor to guide or monitor treatment for MRSA infections. Performed at [facility], [place] [address], [place], [place] [address]   Respiratory Panel by RT PCR (Flu A&B, Covid) - Nasopharyngeal Swab     Status: None   Collection Time: 08/06/20  7:56 AM   Specimen: Nasopharyngeal Swab; Nasopharyngeal(NP) swabs in vial transport medium  Result Value Ref Range Status   SARS Coronavirus 2 by RT PCR NEGATIVE NEGATIVE Final    Comment: (NOTE) SARS-CoV-2 target nucleic acids are NOT DETECTED.  The SARS-CoV-2 RNA is generally detectable in upper respiratoy specimens during the acute phase of infection. The lowest concentration of SARS-CoV-2 viral copies this assay can detect is 131  copies/mL. A negative result does not preclude SARS-Cov-2 infection and should not be used as the sole basis for treatment or other patient management decisions. A negative result may occur with  improper specimen collection/handling, submission of specimen other than nasopharyngeal swab, presence of viral mutation(s) within the areas targeted by this assay, and inadequate  number of viral copies (<131 copies/mL). A negative result must be combined with clinical observations, patient history, and epidemiological information. The expected result is Negative.  Fact Sheet for Patients:  PinkCheek.be  Fact Sheet for Healthcare Providers:  GravelBags.it  This test is no t yet approved or cleared by the [place] FDA and  has been authorized for detection and/or diagnosis of SARS-CoV-2 by FDA under an Emergency Use Authorization (EUA). This EUA will remain  in effect (meaning this test can be used) for the duration of the COVID-19 declaration under Section 564(b)(1) of the Act, 21 U.S.C. section 360bbb-3(b)(1), unless the authorization is terminated or revoked sooner.     Influenza A by PCR NEGATIVE NEGATIVE Final   Influenza B by PCR NEGATIVE NEGATIVE Final    Comment: (NOTE) The Xpert Xpress SARS-CoV-2/FLU/RSV assay is intended as an aid in  the diagnosis of influenza from Nasopharyngeal swab specimens and  should not be used as a sole basis for treatment. Nasal washings and  aspirates are unacceptable for Xpert Xpress SARS-CoV-2/FLU/RSV  testing.  Fact Sheet for Patients: PinkCheek.be  Fact Sheet for Healthcare Providers: GravelBags.it  This test is not yet approved or cleared by the [place] FDA and  has been authorized for detection and/or diagnosis of SARS-CoV-2 by  FDA under an Emergency Use Authorization (EUA). This EUA will remain  in effect (meaning  this test can be used) for the duration of the  Covid-19 declaration under Section 564(b)(1) of the Act, 21  U.S.C. section 360bbb-3(b)(1), unless the authorization is  terminated or revoked. Performed at [facility], [place] [address], [place], [place] [address]      Labs: CBC: Recent Labs  Lab 07/31/20 1733 07/31/20 1733 08/01/20 0430 08/01/20 0430 08/01/20 0443 08/02/20 0216 08/03/20 0915 08/04/20 0322 08/05/20 0336  WBC 10.6*   < > 10.6*  --   --  7.5 7.6 6.7 7.1  NEUTROABS 7.6  --   --   --   --   --   --   --   --   HGB 15.6   < > 15.8   < > 16.3 13.4 14.7 13.1 12.8*  HCT 50.3   < > 50.8   < > 48.0 41.8 45.7 39.3 38.6*  MCV 90.8   < > 90.6  --   --  87.6 87.2 86.4 84.6  PLT 174   < > 174  --   --  112* 114* 96* 107*   < > = values in this interval not displayed.   Basic Metabolic Panel: Recent Labs  Lab 08/01/20 0430 08/01/20 0443 08/01/20 1658 08/02/20 0216 08/03/20 0915 08/04/20 0322 08/05/20 0336  NA 158*   < > 152* 151* 147* 142 141  K 3.5   < > 3.8 3.9 4.2 3.6 3.7  CL 118*   < > 117* 119* 111 110 111  CO2 28   < > 27 18* $Remo'28 23 23  'ElLXA$ GLUCOSE 93   < > 178* 197* 198* 195* 146*  BUN 46*   < > 39* 36* 29* 28* 24*  CREATININE 1.46*   < > 1.23 1.12 1.18 1.00 1.02  CALCIUM 10.2   < > 9.2 8.8* 9.2 8.7* 8.6*  MG 2.6*  --   --  2.1 2.1 1.9  --   PHOS  --   --   --  3.7 4.1 3.9  --    < > = values in this interval not displayed.  Liver Function Tests: Recent Labs  Lab 07/31/20 1733 08/02/20 0216 08/03/20 0915 08/04/20 0322  AST $Re'24 27 18 'fOJ$ 13*  ALT 40 $Remo'25 24 17  'ymfQP$ ALKPHOS 98 65 85 69  BILITOT 1.6* 1.3* 1.7* 1.0  PROT 7.1 5.3* 6.0* 5.2*  ALBUMIN 4.0 2.8* 3.2* 2.9*   CBG: Recent Labs  Lab 08/05/20 0656 08/05/20 1154 08/05/20 2254 08/06/20 0758 08/06/20 1123  GLUCAP 144* 299* 128* 160* 140*    Time spent: 35 minutes  Signed:  Berle Mull  [facility]  08/06/2020 11:50 AM

## 2020-08-06 NOTE — NC FL2 (Addendum)
[place] MEDICAID FL2 LEVEL OF CARE SCREENING TOOL     IDENTIFICATION  Patient Name: Dean Harris Birthdate: 1941-02-22 Sex: male Admission Date (Current Location): 07/31/2020  [place] [facility] and [place] Number:  Producer, television/film/video and Address:  [facility], [address] [place], [place] [address]      Provider Number: [id number]  Attending Physician Name and Address:  Rolly Salter, MD  Relative Name and Phone Number:  Stephannie Peters, [phone]    Current Level of Care: Hospital Recommended Level of Care: Memory Care, Assisted Living Facility Prior Approval Number:    Date Approved/Denied:   PASRR Number:    Discharge Plan: Other (Comment) (Memory care-ALF)    Current Diagnoses: Patient Active Problem List   Diagnosis Date Noted   Hyperosmolar hyperglycemic state (HHS) (HCC) 08/02/2020   Pressure injury of skin 08/01/2020   Type 2 diabetes mellitus with hyperosmolar hyperglycemic state (HHS) (HCC) 07/31/2020   Hypertension associated with diabetes (HCC) 07/31/2020   Hypernatremia 07/31/2020   Hyperkalemia 07/31/2020   Acute subdural hematoma (HCC) 05/10/2020   Hyperlipidemia with target LDL less than 70    Glaucoma    Grade I diastolic dysfunction    AKI (acute kidney injury) (HCC)    Dementia with behavioral disturbance (HCC)    AMS (altered mental status) 01/11/2020   CAP (community acquired pneumonia) 01/10/2020   Weight loss, non-intentional 05/20/2019   Depression, recurrent (HCC) 12/01/2017   Memory disorder 12/01/2017   Carotid artery plaque, bilateral; s/p Bilateral CEA 07/24/2016   Syncope 01/28/2016   Moderate aortic stenosis 01/28/2016   Chronic stable angina (HCC) 01/28/2016   BMI 30.0-30.9,adult 07/09/2015   Thrombocytopenia (HCC) 04/29/2014   Essential hypertension, benign 03/03/2014   Coronary artery disease, non-occlusive    Sleep apnea 11/30/2010   Gout 11/30/2010    Hyperlipidemia associated with type 2 diabetes mellitus (HCC) 11/30/2010   Rosacea 11/30/2010   COPD (chronic obstructive pulmonary disease) (HCC) 11/30/2010   ED (erectile dysfunction) 11/30/2010   Peripheral neuropathy 11/30/2010   Type 2 diabetes mellitus (HCC) 09/16/1999    Orientation RESPIRATION BLADDER Height & Weight      (Pt non-verbal)  Normal Incontinent Weight:   Height:     BEHAVIORAL SYMPTOMS/MOOD NEUROLOGICAL BOWEL NUTRITION STATUS      Continent Diet  AMBULATORY STATUS COMMUNICATION OF NEEDS Skin   Extensive Assist Non-Verbally PU Stage and Appropriate Care (Stage I on coccyx)                       Personal Care Assistance Level of Assistance  Bathing, Feeding, Dressing Bathing Assistance: Maximum assistance Feeding assistance: Limited assistance Dressing Assistance: Maximum assistance     Functional Limitations Info             SPECIAL CARE FACTORS FREQUENCY                       Contractures Contractures Info: Not present    Additional Factors Info  Code Status, Allergies, Insulin Sliding Scale, Psychotropic Code Status Info: Full Allergies Info: Dust Mite Extract, Mold Extract (Trichophyton), Pollen Extract, Tetracyclines & Related Psychotropic Info: Zoloft Insulin Sliding Scale Info: See dc summary for dose       Current Medications (08/06/2020):  This is the current hospital active medication list Current Facility-Administered Medications  Medication Dose Route Frequency Provider Last Rate Last Admin   acetaminophen (TYLENOL) tablet 500 mg  500 mg Oral Q6H PRN  Charlsie Quest, MD   500 mg at 08/04/20 2118   alum & mag hydroxide-simeth (MAALOX/MYLANTA) 200-200-20 MG/5ML suspension 30 mL  30 mL Oral Q6H PRN Pokhrel, Laxman, MD       busPIRone (BUSPAR) tablet 10 mg  10 mg Oral BID PRN Charlsie Quest, MD   10 mg at 08/05/20 1605   dextrose 50 % solution 0-50 mL  0-50 mL Intravenous PRN Charlsie Quest, MD       donepezil  (ARICEPT) tablet 5 mg  5 mg Oral QHS Darreld Mclean R, MD   5 mg at 08/05/20 2035   haloperidol (HALDOL) tablet 2 mg  2 mg Oral Q8H PRN Rolly Salter, MD   2 mg at 08/05/20 2035   hydrALAZINE (APRESOLINE) injection 10 mg  10 mg Intravenous Q6H PRN Pokhrel, Laxman, MD       insulin aspart (novoLOG) injection 0-15 Units  0-15 Units Subcutaneous TID WC Rolly Salter, MD   2 Units at 08/06/20 1136   insulin aspart (novoLOG) injection 0-5 Units  0-5 Units Subcutaneous QHS Rolly Salter, MD       insulin glargine (LANTUS) injection 10 Units  10 Units Subcutaneous Daily Rolly Salter, MD   10 Units at 08/06/20 1007   isosorbide mononitrate (IMDUR) 24 hr tablet 30 mg  30 mg Oral Daily Pokhrel, Laxman, MD   30 mg at 08/05/20 0943   memantine (NAMENDA) tablet 10 mg  10 mg Oral BID Charlsie Quest, MD   10 mg at 08/05/20 2035   sertraline (ZOLOFT) tablet 200 mg  200 mg Oral q AM Charlsie Quest, MD   200 mg at 08/05/20 5643     Discharge Medications: TAKE these medications       acetaminophen 500 MG tablet Commonly known as: TYLENOL Take 500 mg by mouth every 6 (six) hours as needed (for fever 99.5-101 F, minor headaches or discomfort- report any fever >101 F, severe headache or discomfort).   busPIRone 10 MG tablet Commonly known as: BUSPAR Take 10 mg by mouth 2 (two) times daily as needed (for anxiety).   Dermacloud Oint Apply 1 application topically See admin instructions. Apply topically every 3 hours- with every incontinent change   donepezil 10 MG tablet Commonly known as: ARICEPT TAKE 1/2 (ONE-HALF) TABLET BY MOUTH AT BEDTIME (Needs to be seen before next refill) What changed:   how much to take  how to take this  when to take this  additional instructions   insulin glargine 100 UNIT/ML injection Commonly known as: LANTUS Inject 0.1 mLs (10 Units total) into the skin daily. Start taking on: August 07, 2020   INSULIN SYRINGE .3CC/31GX5/16" 31G X 5/16" 0.3  ML Misc 1 Act by Does not apply route daily.   loperamide 2 MG capsule Commonly known as: IMODIUM Take 2 mg by mouth as needed (with each loose stool/diarrhea- Max of 8 doses/24 hours- REPORT ANY BLOOD).   memantine 10 MG tablet Commonly known as: NAMENDA Take 1 tablet (10 mg total) by mouth 2 (two) times daily.   Mi-Acid 200-200-20 MG/5ML suspension Generic drug: alum & mag hydroxide-simeth Take 30 mLs by mouth every 6 (six) hours as needed for indigestion or heartburn.   Milk of Magnesia 400 MG/5ML suspension Generic drug: magnesium hydroxide Take 30 mLs by mouth at bedtime as needed for mild constipation.   nitroGLYCERIN 0.4 MG SL tablet Commonly known as: NITROSTAT Use 1 tabket under tongue at onset and may  repeat every 5 minutes x2 What changed:   how much to take  how to take this  when to take this  reasons to take this  additional instructions   Remedy Calazime 0.4-20.5 % Pste Generic drug: Menthol-Zinc Oxide Apply 1 application topically See admin instructions. Apply to the sacrum 3 times a day AND three times a day as needed after incontinence   Robafen 100 MG/5ML syrup Generic drug: guaifenesin Take 200 mg by mouth every 6 (six) hours as needed for cough.   sertraline 100 MG tablet Commonly known as: ZOLOFT Take 2 tablets (200 mg total) by mouth in the morning.   Travoprost (BAK Free) 0.004 % Soln ophthalmic solution Commonly known as: TRAVATAN Place 1 drop into both eyes at bedtime.   Triple Antibiotic [phone] Oint Apply 1 application topically See admin instructions. Apply to minor skin tears or abrasions after cleansing with normal saline- cover with Band-Aids or gauze & tape     Relevant Imaging Results:  Relevant Lab Results:   Additional Information SS#: [id number]  Eduard Roux, LCSWA

## 2020-08-07 ENCOUNTER — Telehealth: Payer: Self-pay | Admitting: *Deleted

## 2020-08-07 NOTE — Telephone Encounter (Signed)
Transition Care Management Unsuccessful Follow-up Telephone Call  Date of discharge and from where:  08/06/20 from [name]  Attempts:  LMTCB with granddaughter. He is in a Assisted Living facility in [place].  Reason for unsuccessful TCM follow-up call:  Pt needs TOC visit with in a week per Discharge Summerie

## 2020-08-07 NOTE — Consult Note (Signed)
   [facility] Inpatient Consult   08/07/2020  [name] 02/28/1941 [id number]   [facility] [[facility]]  Accountable Care Organization [ACO] Patient:  Dean Harris PPO  Patient is in Embedded practice at [facility] and will alert the Embedded staff of patient transitioned back to [facility] ALF.  Charlesetta Shanks, RN BSN CCM [facility]  [phone] business mobile phone Toll free office [phone]  Fax number: [phone] [email or web address]

## 2020-08-08 DIAGNOSIS — Z20828 Contact with and (suspected) exposure to other viral communicable diseases: Secondary | ICD-10-CM | POA: Diagnosis not present

## 2020-08-08 DIAGNOSIS — E119 Type 2 diabetes mellitus without complications: Secondary | ICD-10-CM | POA: Diagnosis not present

## 2020-08-08 DIAGNOSIS — I10 Essential (primary) hypertension: Secondary | ICD-10-CM | POA: Diagnosis not present

## 2020-08-08 DIAGNOSIS — F0150 Vascular dementia without behavioral disturbance: Secondary | ICD-10-CM | POA: Diagnosis not present

## 2020-08-08 NOTE — Telephone Encounter (Signed)
   Transitional Care Note Hospital Discharge to Skilled Nursing Facility 08/08/2020 Reviewed [id number] [facility] LPN  Pieter Fooks was admitted to [facility] on 07/31/2020 with AKI, dehydration, and hyperglycemia.  he was transferred from [name] to an Alzheimer's unit in [place] on 08/06/2020  he will be followed by the facility's staff physicians while there and will need to follow-up with Bennie Pierini, FNP (PCP) within two weeks of discharge from SNF for Transitional Care Management.   Plan . Clinical staff will monitor discharge reports and contact patient to initiate TCM within 2 business days of SNF discharge.  . After discharge CCM team will collaborate with PCP regarding potential CCM eligibility and the benefits CCM services provide the patient. The primary goal would be to increase patient self-management of chronic medical conditions resulting in improved patient health outcomes, decreased exacerbations of chronic medical conditions, and decreased ED visits/hospital admissions.

## 2020-08-10 DIAGNOSIS — S065X0D Traumatic subdural hemorrhage without loss of consciousness, subsequent encounter: Secondary | ICD-10-CM | POA: Diagnosis not present

## 2020-08-10 DIAGNOSIS — F0150 Vascular dementia without behavioral disturbance: Secondary | ICD-10-CM | POA: Diagnosis not present

## 2020-08-10 DIAGNOSIS — E1142 Type 2 diabetes mellitus with diabetic polyneuropathy: Secondary | ICD-10-CM | POA: Diagnosis not present

## 2020-08-10 DIAGNOSIS — G4730 Sleep apnea, unspecified: Secondary | ICD-10-CM | POA: Diagnosis not present

## 2020-08-10 DIAGNOSIS — Z9181 History of falling: Secondary | ICD-10-CM | POA: Diagnosis not present

## 2020-08-10 DIAGNOSIS — I10 Essential (primary) hypertension: Secondary | ICD-10-CM | POA: Diagnosis not present

## 2020-08-10 DIAGNOSIS — J449 Chronic obstructive pulmonary disease, unspecified: Secondary | ICD-10-CM | POA: Diagnosis not present

## 2020-08-10 DIAGNOSIS — F419 Anxiety disorder, unspecified: Secondary | ICD-10-CM | POA: Diagnosis not present

## 2020-08-10 DIAGNOSIS — I2510 Atherosclerotic heart disease of native coronary artery without angina pectoris: Secondary | ICD-10-CM | POA: Diagnosis not present

## 2020-08-13 DIAGNOSIS — Z9181 History of falling: Secondary | ICD-10-CM | POA: Diagnosis not present

## 2020-08-13 DIAGNOSIS — I10 Essential (primary) hypertension: Secondary | ICD-10-CM | POA: Diagnosis not present

## 2020-08-13 DIAGNOSIS — S065X0D Traumatic subdural hemorrhage without loss of consciousness, subsequent encounter: Secondary | ICD-10-CM | POA: Diagnosis not present

## 2020-08-13 DIAGNOSIS — F419 Anxiety disorder, unspecified: Secondary | ICD-10-CM | POA: Diagnosis not present

## 2020-08-13 DIAGNOSIS — F0150 Vascular dementia without behavioral disturbance: Secondary | ICD-10-CM | POA: Diagnosis not present

## 2020-08-13 DIAGNOSIS — I2510 Atherosclerotic heart disease of native coronary artery without angina pectoris: Secondary | ICD-10-CM | POA: Diagnosis not present

## 2020-08-13 DIAGNOSIS — J449 Chronic obstructive pulmonary disease, unspecified: Secondary | ICD-10-CM | POA: Diagnosis not present

## 2020-08-13 DIAGNOSIS — E1142 Type 2 diabetes mellitus with diabetic polyneuropathy: Secondary | ICD-10-CM | POA: Diagnosis not present

## 2020-08-13 DIAGNOSIS — G4730 Sleep apnea, unspecified: Secondary | ICD-10-CM | POA: Diagnosis not present

## 2020-08-14 DIAGNOSIS — R2681 Unsteadiness on feet: Secondary | ICD-10-CM | POA: Diagnosis not present

## 2020-08-14 DIAGNOSIS — M62561 Muscle wasting and atrophy, not elsewhere classified, right lower leg: Secondary | ICD-10-CM | POA: Diagnosis not present

## 2020-08-14 DIAGNOSIS — I10 Essential (primary) hypertension: Secondary | ICD-10-CM | POA: Diagnosis not present

## 2020-08-14 DIAGNOSIS — F0150 Vascular dementia without behavioral disturbance: Secondary | ICD-10-CM | POA: Diagnosis not present

## 2020-08-14 DIAGNOSIS — Z9181 History of falling: Secondary | ICD-10-CM | POA: Diagnosis not present

## 2020-08-14 DIAGNOSIS — I2510 Atherosclerotic heart disease of native coronary artery without angina pectoris: Secondary | ICD-10-CM | POA: Diagnosis not present

## 2020-08-14 DIAGNOSIS — M62562 Muscle wasting and atrophy, not elsewhere classified, left lower leg: Secondary | ICD-10-CM | POA: Diagnosis not present

## 2020-08-14 DIAGNOSIS — G629 Polyneuropathy, unspecified: Secondary | ICD-10-CM | POA: Diagnosis not present

## 2020-08-14 DIAGNOSIS — S065X0D Traumatic subdural hemorrhage without loss of consciousness, subsequent encounter: Secondary | ICD-10-CM | POA: Diagnosis not present

## 2020-08-14 DIAGNOSIS — E1142 Type 2 diabetes mellitus with diabetic polyneuropathy: Secondary | ICD-10-CM | POA: Diagnosis not present

## 2020-08-14 DIAGNOSIS — J449 Chronic obstructive pulmonary disease, unspecified: Secondary | ICD-10-CM | POA: Diagnosis not present

## 2020-08-14 DIAGNOSIS — J189 Pneumonia, unspecified organism: Secondary | ICD-10-CM | POA: Diagnosis not present

## 2020-08-14 DIAGNOSIS — R262 Difficulty in walking, not elsewhere classified: Secondary | ICD-10-CM | POA: Diagnosis not present

## 2020-08-14 DIAGNOSIS — F419 Anxiety disorder, unspecified: Secondary | ICD-10-CM | POA: Diagnosis not present

## 2020-08-14 DIAGNOSIS — M6281 Muscle weakness (generalized): Secondary | ICD-10-CM | POA: Diagnosis not present

## 2020-08-14 DIAGNOSIS — R4182 Altered mental status, unspecified: Secondary | ICD-10-CM | POA: Diagnosis not present

## 2020-08-14 DIAGNOSIS — G4730 Sleep apnea, unspecified: Secondary | ICD-10-CM | POA: Diagnosis not present

## 2020-08-15 DIAGNOSIS — G4730 Sleep apnea, unspecified: Secondary | ICD-10-CM | POA: Diagnosis not present

## 2020-08-15 DIAGNOSIS — I10 Essential (primary) hypertension: Secondary | ICD-10-CM | POA: Diagnosis not present

## 2020-08-15 DIAGNOSIS — F419 Anxiety disorder, unspecified: Secondary | ICD-10-CM | POA: Diagnosis not present

## 2020-08-15 DIAGNOSIS — Z9181 History of falling: Secondary | ICD-10-CM | POA: Diagnosis not present

## 2020-08-15 DIAGNOSIS — I2510 Atherosclerotic heart disease of native coronary artery without angina pectoris: Secondary | ICD-10-CM | POA: Diagnosis not present

## 2020-08-15 DIAGNOSIS — S065X0D Traumatic subdural hemorrhage without loss of consciousness, subsequent encounter: Secondary | ICD-10-CM | POA: Diagnosis not present

## 2020-08-15 DIAGNOSIS — J449 Chronic obstructive pulmonary disease, unspecified: Secondary | ICD-10-CM | POA: Diagnosis not present

## 2020-08-15 DIAGNOSIS — F0150 Vascular dementia without behavioral disturbance: Secondary | ICD-10-CM | POA: Diagnosis not present

## 2020-08-15 DIAGNOSIS — E1142 Type 2 diabetes mellitus with diabetic polyneuropathy: Secondary | ICD-10-CM | POA: Diagnosis not present

## 2020-08-16 DIAGNOSIS — G4730 Sleep apnea, unspecified: Secondary | ICD-10-CM | POA: Diagnosis not present

## 2020-08-16 DIAGNOSIS — S065X0D Traumatic subdural hemorrhage without loss of consciousness, subsequent encounter: Secondary | ICD-10-CM | POA: Diagnosis not present

## 2020-08-16 DIAGNOSIS — Z9181 History of falling: Secondary | ICD-10-CM | POA: Diagnosis not present

## 2020-08-16 DIAGNOSIS — J449 Chronic obstructive pulmonary disease, unspecified: Secondary | ICD-10-CM | POA: Diagnosis not present

## 2020-08-16 DIAGNOSIS — F0150 Vascular dementia without behavioral disturbance: Secondary | ICD-10-CM | POA: Diagnosis not present

## 2020-08-16 DIAGNOSIS — E1142 Type 2 diabetes mellitus with diabetic polyneuropathy: Secondary | ICD-10-CM | POA: Diagnosis not present

## 2020-08-16 DIAGNOSIS — F419 Anxiety disorder, unspecified: Secondary | ICD-10-CM | POA: Diagnosis not present

## 2020-08-16 DIAGNOSIS — I10 Essential (primary) hypertension: Secondary | ICD-10-CM | POA: Diagnosis not present

## 2020-08-16 DIAGNOSIS — I2510 Atherosclerotic heart disease of native coronary artery without angina pectoris: Secondary | ICD-10-CM | POA: Diagnosis not present

## 2020-08-20 DIAGNOSIS — G4730 Sleep apnea, unspecified: Secondary | ICD-10-CM | POA: Diagnosis not present

## 2020-08-20 DIAGNOSIS — F0150 Vascular dementia without behavioral disturbance: Secondary | ICD-10-CM | POA: Diagnosis not present

## 2020-08-20 DIAGNOSIS — S065X0D Traumatic subdural hemorrhage without loss of consciousness, subsequent encounter: Secondary | ICD-10-CM | POA: Diagnosis not present

## 2020-08-20 DIAGNOSIS — Z9181 History of falling: Secondary | ICD-10-CM | POA: Diagnosis not present

## 2020-08-20 DIAGNOSIS — E1142 Type 2 diabetes mellitus with diabetic polyneuropathy: Secondary | ICD-10-CM | POA: Diagnosis not present

## 2020-08-20 DIAGNOSIS — F419 Anxiety disorder, unspecified: Secondary | ICD-10-CM | POA: Diagnosis not present

## 2020-08-20 DIAGNOSIS — M62561 Muscle wasting and atrophy, not elsewhere classified, right lower leg: Secondary | ICD-10-CM | POA: Diagnosis not present

## 2020-08-20 DIAGNOSIS — I10 Essential (primary) hypertension: Secondary | ICD-10-CM | POA: Diagnosis not present

## 2020-08-20 DIAGNOSIS — R3589 Other polyuria: Secondary | ICD-10-CM | POA: Diagnosis not present

## 2020-08-20 DIAGNOSIS — J449 Chronic obstructive pulmonary disease, unspecified: Secondary | ICD-10-CM | POA: Diagnosis not present

## 2020-08-20 DIAGNOSIS — G629 Polyneuropathy, unspecified: Secondary | ICD-10-CM | POA: Diagnosis not present

## 2020-08-20 DIAGNOSIS — E1165 Type 2 diabetes mellitus with hyperglycemia: Secondary | ICD-10-CM | POA: Diagnosis not present

## 2020-08-20 DIAGNOSIS — R2681 Unsteadiness on feet: Secondary | ICD-10-CM | POA: Diagnosis not present

## 2020-08-20 DIAGNOSIS — M62562 Muscle wasting and atrophy, not elsewhere classified, left lower leg: Secondary | ICD-10-CM | POA: Diagnosis not present

## 2020-08-20 DIAGNOSIS — I2510 Atherosclerotic heart disease of native coronary artery without angina pectoris: Secondary | ICD-10-CM | POA: Diagnosis not present

## 2020-08-21 DIAGNOSIS — I351 Nonrheumatic aortic (valve) insufficiency: Secondary | ICD-10-CM | POA: Diagnosis not present

## 2020-08-21 DIAGNOSIS — E1100 Type 2 diabetes mellitus with hyperosmolarity without nonketotic hyperglycemic-hyperosmolar coma (NKHHC): Secondary | ICD-10-CM | POA: Diagnosis not present

## 2020-08-21 DIAGNOSIS — E1142 Type 2 diabetes mellitus with diabetic polyneuropathy: Secondary | ICD-10-CM | POA: Diagnosis not present

## 2020-08-21 DIAGNOSIS — H409 Unspecified glaucoma: Secondary | ICD-10-CM | POA: Diagnosis not present

## 2020-08-21 DIAGNOSIS — E785 Hyperlipidemia, unspecified: Secondary | ICD-10-CM | POA: Diagnosis not present

## 2020-08-21 DIAGNOSIS — I119 Hypertensive heart disease without heart failure: Secondary | ICD-10-CM | POA: Diagnosis not present

## 2020-08-21 DIAGNOSIS — J449 Chronic obstructive pulmonary disease, unspecified: Secondary | ICD-10-CM | POA: Diagnosis not present

## 2020-08-21 DIAGNOSIS — I25118 Atherosclerotic heart disease of native coronary artery with other forms of angina pectoris: Secondary | ICD-10-CM | POA: Diagnosis not present

## 2020-08-21 DIAGNOSIS — G4730 Sleep apnea, unspecified: Secondary | ICD-10-CM | POA: Diagnosis not present

## 2020-08-22 DIAGNOSIS — E1100 Type 2 diabetes mellitus with hyperosmolarity without nonketotic hyperglycemic-hyperosmolar coma (NKHHC): Secondary | ICD-10-CM | POA: Diagnosis not present

## 2020-08-22 DIAGNOSIS — M62562 Muscle wasting and atrophy, not elsewhere classified, left lower leg: Secondary | ICD-10-CM | POA: Diagnosis not present

## 2020-08-22 DIAGNOSIS — R2681 Unsteadiness on feet: Secondary | ICD-10-CM | POA: Diagnosis not present

## 2020-08-22 DIAGNOSIS — M62561 Muscle wasting and atrophy, not elsewhere classified, right lower leg: Secondary | ICD-10-CM | POA: Diagnosis not present

## 2020-08-22 DIAGNOSIS — E1142 Type 2 diabetes mellitus with diabetic polyneuropathy: Secondary | ICD-10-CM | POA: Diagnosis not present

## 2020-08-22 DIAGNOSIS — E785 Hyperlipidemia, unspecified: Secondary | ICD-10-CM | POA: Diagnosis not present

## 2020-08-22 DIAGNOSIS — I351 Nonrheumatic aortic (valve) insufficiency: Secondary | ICD-10-CM | POA: Diagnosis not present

## 2020-08-22 DIAGNOSIS — H409 Unspecified glaucoma: Secondary | ICD-10-CM | POA: Diagnosis not present

## 2020-08-22 DIAGNOSIS — I25118 Atherosclerotic heart disease of native coronary artery with other forms of angina pectoris: Secondary | ICD-10-CM | POA: Diagnosis not present

## 2020-08-22 DIAGNOSIS — I119 Hypertensive heart disease without heart failure: Secondary | ICD-10-CM | POA: Diagnosis not present

## 2020-08-22 DIAGNOSIS — J449 Chronic obstructive pulmonary disease, unspecified: Secondary | ICD-10-CM | POA: Diagnosis not present

## 2020-08-22 DIAGNOSIS — G4730 Sleep apnea, unspecified: Secondary | ICD-10-CM | POA: Diagnosis not present

## 2020-08-23 DIAGNOSIS — R2681 Unsteadiness on feet: Secondary | ICD-10-CM | POA: Diagnosis not present

## 2020-08-23 DIAGNOSIS — M62562 Muscle wasting and atrophy, not elsewhere classified, left lower leg: Secondary | ICD-10-CM | POA: Diagnosis not present

## 2020-08-23 DIAGNOSIS — M62561 Muscle wasting and atrophy, not elsewhere classified, right lower leg: Secondary | ICD-10-CM | POA: Diagnosis not present

## 2020-08-27 DIAGNOSIS — E1165 Type 2 diabetes mellitus with hyperglycemia: Secondary | ICD-10-CM | POA: Diagnosis not present

## 2020-08-28 DIAGNOSIS — F0150 Vascular dementia without behavioral disturbance: Secondary | ICD-10-CM | POA: Diagnosis not present

## 2020-08-28 DIAGNOSIS — J449 Chronic obstructive pulmonary disease, unspecified: Secondary | ICD-10-CM | POA: Diagnosis not present

## 2020-08-28 DIAGNOSIS — I119 Hypertensive heart disease without heart failure: Secondary | ICD-10-CM | POA: Diagnosis not present

## 2020-08-28 DIAGNOSIS — I10 Essential (primary) hypertension: Secondary | ICD-10-CM | POA: Diagnosis not present

## 2020-08-28 DIAGNOSIS — E1100 Type 2 diabetes mellitus with hyperosmolarity without nonketotic hyperglycemic-hyperosmolar coma (NKHHC): Secondary | ICD-10-CM | POA: Diagnosis not present

## 2020-08-28 DIAGNOSIS — E785 Hyperlipidemia, unspecified: Secondary | ICD-10-CM | POA: Diagnosis not present

## 2020-08-28 DIAGNOSIS — I351 Nonrheumatic aortic (valve) insufficiency: Secondary | ICD-10-CM | POA: Diagnosis not present

## 2020-08-28 DIAGNOSIS — H409 Unspecified glaucoma: Secondary | ICD-10-CM | POA: Diagnosis not present

## 2020-08-28 DIAGNOSIS — G4730 Sleep apnea, unspecified: Secondary | ICD-10-CM | POA: Diagnosis not present

## 2020-08-28 DIAGNOSIS — E1142 Type 2 diabetes mellitus with diabetic polyneuropathy: Secondary | ICD-10-CM | POA: Diagnosis not present

## 2020-08-28 DIAGNOSIS — I25118 Atherosclerotic heart disease of native coronary artery with other forms of angina pectoris: Secondary | ICD-10-CM | POA: Diagnosis not present

## 2020-08-29 DIAGNOSIS — M62561 Muscle wasting and atrophy, not elsewhere classified, right lower leg: Secondary | ICD-10-CM | POA: Diagnosis not present

## 2020-08-29 DIAGNOSIS — R2681 Unsteadiness on feet: Secondary | ICD-10-CM | POA: Diagnosis not present

## 2020-08-29 DIAGNOSIS — M62562 Muscle wasting and atrophy, not elsewhere classified, left lower leg: Secondary | ICD-10-CM | POA: Diagnosis not present

## 2020-08-31 DIAGNOSIS — H409 Unspecified glaucoma: Secondary | ICD-10-CM | POA: Diagnosis not present

## 2020-08-31 DIAGNOSIS — E1142 Type 2 diabetes mellitus with diabetic polyneuropathy: Secondary | ICD-10-CM | POA: Diagnosis not present

## 2020-08-31 DIAGNOSIS — I25118 Atherosclerotic heart disease of native coronary artery with other forms of angina pectoris: Secondary | ICD-10-CM | POA: Diagnosis not present

## 2020-08-31 DIAGNOSIS — J449 Chronic obstructive pulmonary disease, unspecified: Secondary | ICD-10-CM | POA: Diagnosis not present

## 2020-08-31 DIAGNOSIS — I351 Nonrheumatic aortic (valve) insufficiency: Secondary | ICD-10-CM | POA: Diagnosis not present

## 2020-08-31 DIAGNOSIS — G4730 Sleep apnea, unspecified: Secondary | ICD-10-CM | POA: Diagnosis not present

## 2020-08-31 DIAGNOSIS — E1100 Type 2 diabetes mellitus with hyperosmolarity without nonketotic hyperglycemic-hyperosmolar coma (NKHHC): Secondary | ICD-10-CM | POA: Diagnosis not present

## 2020-08-31 DIAGNOSIS — E785 Hyperlipidemia, unspecified: Secondary | ICD-10-CM | POA: Diagnosis not present

## 2020-08-31 DIAGNOSIS — I119 Hypertensive heart disease without heart failure: Secondary | ICD-10-CM | POA: Diagnosis not present

## 2020-09-03 DIAGNOSIS — L89329 Pressure ulcer of left buttock, unspecified stage: Secondary | ICD-10-CM | POA: Diagnosis not present

## 2020-09-03 DIAGNOSIS — G629 Polyneuropathy, unspecified: Secondary | ICD-10-CM | POA: Diagnosis not present

## 2020-09-03 DIAGNOSIS — M62561 Muscle wasting and atrophy, not elsewhere classified, right lower leg: Secondary | ICD-10-CM | POA: Diagnosis not present

## 2020-09-03 DIAGNOSIS — R627 Adult failure to thrive: Secondary | ICD-10-CM | POA: Diagnosis not present

## 2020-09-03 DIAGNOSIS — M62562 Muscle wasting and atrophy, not elsewhere classified, left lower leg: Secondary | ICD-10-CM | POA: Diagnosis not present

## 2020-09-03 DIAGNOSIS — L89319 Pressure ulcer of right buttock, unspecified stage: Secondary | ICD-10-CM | POA: Diagnosis not present

## 2020-09-03 DIAGNOSIS — J449 Chronic obstructive pulmonary disease, unspecified: Secondary | ICD-10-CM | POA: Diagnosis not present

## 2020-09-03 DIAGNOSIS — E1165 Type 2 diabetes mellitus with hyperglycemia: Secondary | ICD-10-CM | POA: Diagnosis not present

## 2020-09-03 DIAGNOSIS — R2681 Unsteadiness on feet: Secondary | ICD-10-CM | POA: Diagnosis not present

## 2020-09-04 DIAGNOSIS — E1142 Type 2 diabetes mellitus with diabetic polyneuropathy: Secondary | ICD-10-CM | POA: Diagnosis not present

## 2020-09-04 DIAGNOSIS — E1100 Type 2 diabetes mellitus with hyperosmolarity without nonketotic hyperglycemic-hyperosmolar coma (NKHHC): Secondary | ICD-10-CM | POA: Diagnosis not present

## 2020-09-04 DIAGNOSIS — G4730 Sleep apnea, unspecified: Secondary | ICD-10-CM | POA: Diagnosis not present

## 2020-09-04 DIAGNOSIS — J449 Chronic obstructive pulmonary disease, unspecified: Secondary | ICD-10-CM | POA: Diagnosis not present

## 2020-09-04 DIAGNOSIS — E785 Hyperlipidemia, unspecified: Secondary | ICD-10-CM | POA: Diagnosis not present

## 2020-09-04 DIAGNOSIS — H409 Unspecified glaucoma: Secondary | ICD-10-CM | POA: Diagnosis not present

## 2020-09-04 DIAGNOSIS — I25118 Atherosclerotic heart disease of native coronary artery with other forms of angina pectoris: Secondary | ICD-10-CM | POA: Diagnosis not present

## 2020-09-04 DIAGNOSIS — I351 Nonrheumatic aortic (valve) insufficiency: Secondary | ICD-10-CM | POA: Diagnosis not present

## 2020-09-04 DIAGNOSIS — I119 Hypertensive heart disease without heart failure: Secondary | ICD-10-CM | POA: Diagnosis not present

## 2020-09-06 DIAGNOSIS — R2681 Unsteadiness on feet: Secondary | ICD-10-CM | POA: Diagnosis not present

## 2020-09-06 DIAGNOSIS — M62562 Muscle wasting and atrophy, not elsewhere classified, left lower leg: Secondary | ICD-10-CM | POA: Diagnosis not present

## 2020-09-06 DIAGNOSIS — M62561 Muscle wasting and atrophy, not elsewhere classified, right lower leg: Secondary | ICD-10-CM | POA: Diagnosis not present

## 2020-09-07 DIAGNOSIS — R2681 Unsteadiness on feet: Secondary | ICD-10-CM | POA: Diagnosis not present

## 2020-09-07 DIAGNOSIS — M62562 Muscle wasting and atrophy, not elsewhere classified, left lower leg: Secondary | ICD-10-CM | POA: Diagnosis not present

## 2020-09-07 DIAGNOSIS — M62561 Muscle wasting and atrophy, not elsewhere classified, right lower leg: Secondary | ICD-10-CM | POA: Diagnosis not present

## 2020-09-11 DIAGNOSIS — M62562 Muscle wasting and atrophy, not elsewhere classified, left lower leg: Secondary | ICD-10-CM | POA: Diagnosis not present

## 2020-09-11 DIAGNOSIS — R2681 Unsteadiness on feet: Secondary | ICD-10-CM | POA: Diagnosis not present

## 2020-09-11 DIAGNOSIS — M62561 Muscle wasting and atrophy, not elsewhere classified, right lower leg: Secondary | ICD-10-CM | POA: Diagnosis not present

## 2020-09-13 DIAGNOSIS — R4182 Altered mental status, unspecified: Secondary | ICD-10-CM | POA: Diagnosis not present

## 2020-09-13 DIAGNOSIS — J189 Pneumonia, unspecified organism: Secondary | ICD-10-CM | POA: Diagnosis not present

## 2020-09-13 DIAGNOSIS — R262 Difficulty in walking, not elsewhere classified: Secondary | ICD-10-CM | POA: Diagnosis not present

## 2020-09-13 DIAGNOSIS — R2681 Unsteadiness on feet: Secondary | ICD-10-CM | POA: Diagnosis not present

## 2020-09-17 ENCOUNTER — Ambulatory Visit: Payer: Medicare PPO | Admitting: Cardiology

## 2020-09-17 DIAGNOSIS — R627 Adult failure to thrive: Secondary | ICD-10-CM | POA: Diagnosis not present

## 2020-09-17 DIAGNOSIS — F0150 Vascular dementia without behavioral disturbance: Secondary | ICD-10-CM | POA: Diagnosis not present

## 2020-09-17 DIAGNOSIS — E1165 Type 2 diabetes mellitus with hyperglycemia: Secondary | ICD-10-CM | POA: Diagnosis not present

## 2021-01-22 ENCOUNTER — Encounter (HOSPITAL_COMMUNITY): Payer: Self-pay

## 2021-01-22 ENCOUNTER — Emergency Department (HOSPITAL_COMMUNITY)

## 2021-01-22 ENCOUNTER — Emergency Department (HOSPITAL_COMMUNITY)
Admission: EM | Admit: 2021-01-22 | Discharge: 2021-01-23 | Disposition: A | Discharge status: Skilled Nursing Facility | Attending: Emergency Medicine | Admitting: Emergency Medicine

## 2021-01-22 DIAGNOSIS — F0391 Unspecified dementia with behavioral disturbance: Secondary | ICD-10-CM | POA: Diagnosis not present

## 2021-01-22 DIAGNOSIS — J449 Chronic obstructive pulmonary disease, unspecified: Secondary | ICD-10-CM | POA: Diagnosis not present

## 2021-01-22 DIAGNOSIS — Z794 Long term (current) use of insulin: Secondary | ICD-10-CM | POA: Insufficient documentation

## 2021-01-22 DIAGNOSIS — I10 Essential (primary) hypertension: Secondary | ICD-10-CM | POA: Diagnosis not present

## 2021-01-22 DIAGNOSIS — W19XXXA Unspecified fall, initial encounter: Secondary | ICD-10-CM

## 2021-01-22 DIAGNOSIS — E119 Type 2 diabetes mellitus without complications: Secondary | ICD-10-CM | POA: Insufficient documentation

## 2021-01-22 DIAGNOSIS — Z043 Encounter for examination and observation following other accident: Secondary | ICD-10-CM | POA: Diagnosis present

## 2021-01-22 DIAGNOSIS — Z87891 Personal history of nicotine dependence: Secondary | ICD-10-CM | POA: Insufficient documentation

## 2021-01-22 DIAGNOSIS — W050XXA Fall from non-moving wheelchair, initial encounter: Secondary | ICD-10-CM | POA: Diagnosis not present

## 2021-01-22 DIAGNOSIS — I2510 Atherosclerotic heart disease of native coronary artery without angina pectoris: Secondary | ICD-10-CM | POA: Insufficient documentation

## 2021-01-22 MED ORDER — HALOPERIDOL LACTATE 5 MG/ML IJ SOLN
2.0000 mg | Freq: Once | INTRAMUSCULAR | Status: AC
  Administered 2021-01-22: 2 mg via INTRAMUSCULAR
  Filled 2021-01-22: qty 1

## 2021-01-22 NOTE — ED Notes (Signed)
Called [facility] for patient to go to [facility], only 2 patients ahead.
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
## 2021-01-22 NOTE — ED Notes (Signed)
Continues to wait on transport at this time

## 2021-01-22 NOTE — ED Notes (Signed)
This RN updated family on patient.
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
## 2021-01-22 NOTE — ED Notes (Signed)
Report given to [facility] at [facility]

## 2021-01-22 NOTE — ED Notes (Signed)
Received verbal report from Daija s RN at this time. Pt moved to room 42.

## 2021-01-22 NOTE — Discharge Instructions (Signed)
The CT scan did not show any traumatic finding in the head or neck.  If he develops any other new symptoms such as chest pain, difficulty breathing, abdominal pain or other new concerning symptom, come back to ER for reassessment.

## 2021-01-23 NOTE — ED Notes (Signed)
[facility] arrived for transport back to facility

## 2021-03-15 ENCOUNTER — Ambulatory Visit: Payer: Medicare PPO | Admitting: Cardiology

## 2021-03-15 NOTE — Progress Notes (Deleted)
Primary Care Provider: Bennie Pierini, FNP Cardiologist: None Electrophysiologist: None  Clinic Note: No chief complaint on file.   ===================================  ASSESSMENT/PLAN   Problem List Items Addressed This Visit     Moderate aortic stenosis (Chronic)   Hyperlipidemia associated with type 2 diabetes mellitus (HCC) - Primary (Chronic)   Carotid artery plaque, bilateral; s/p Bilateral CEA (Chronic)   Coronary artery disease, non-occlusive (Chronic)   Chronic stable angina (HCC) (Chronic)   Hypertension associated with diabetes (HCC) (Chronic)    ===================================  HPI:    Dean Harris is a 80 y.o. male with a PMH below who presents today for ***. Dean Harris is a 80 y.o. male who is being seen today for the evaluation of *** at the request of Dean Harris, Mary-Margaret, *.  Safal Halderman was last seen on ***  Recent Hospitalizations:  05/09/2020: ER visit near syncope => admitted 05/10/2020 subdural hematoma 07/15/2020: ER visit for fall 07/31/2020 ER visit for AKI/dehydration 01/22/2021 ER visit for fall  Reviewed  CV studies:    The following studies were reviewed today: (if available, images/films reviewed: From Epic Chart or Care Everywhere) Echo 05/09/2020: EF 60 to 65%.  GR 1 DD.  No R WMA.  Normal RV.  Moderate aortic stenosis.  Mean gradient 32 mmHg.  No change   Interval History:   Dean Harris   CV Review of Symptoms (Summary) Cardiovascular ROS: {roscv:310661}  REVIEWED OF SYSTEMS   ROS  I have reviewed and (if needed) personally updated the patient's problem list, medications, allergies, past medical and surgical history, social and family history.   PAST MEDICAL HISTORY   Past Medical History:  Diagnosis Date   Chronic stable angina (HCC)    Coronary artery disease, non-occlusive 01/2012   Diffuse percent LAD, OM1 and mid RCA 50% lesions   Depression    Diabetes mellitus    Essential  hypertension    Glaucoma    Grade I diastolic dysfunction    Hyperlipidemia with target LDL less than 70    Memory difficulties    Moderate calcific aortic stenosis 01/2012   01/2012: Echo - mean/peak gradient12/21 mmHg; 01/2016: Moderate aortic stenosis (mean/peak gradient 24/35 mmHg)    Pneumonia    x2   PTSD (post-traumatic stress disorder) - with depression symptoms    [facility] This has helped his memory issues.   Thrombocytopenia (HCC) 04/29/2014   Vitamin D deficiency     PAST SURGICAL HISTORY   Past Surgical History:  Procedure Laterality Date   CAROTID ENDARTERECTOMY     LEFT HEART CATHETERIZATION WITH CORONARY ANGIOGRAM N/A 02/13/2012   Procedure: LEFT HEART CATHETERIZATION WITH CORONARY ANGIOGRAM;  Surgeon: Donato Schultz, MD;  Location: [facility] CATH LAB;  Service: Cardiovascular: Diffuse mid LAD 50%, 50%pOM1, 50%mRCA -> medical management   TRANSTHORACIC ECHOCARDIOGRAM  01/2012   EF 55-60%. Mild LVH. No RWMA, mild Aortic Stenosis (mean/peak gradient 13 mmHg/21 mmHg)   TRANSTHORACIC ECHOCARDIOGRAM  May 2017, May 2019   A) EF 60-65% w/o RWMA. Normal DF for age. Mod AD (mean/peak gradient 24/35 mmHg); b) EF 60-65%, mod LVH, Mod AS (Mean gradient (S): 28 mm Hg. Peak 43 mmHg   US CAROTID DOPPLER BILATERAL ([facility] HX) Bilateral 02/05/2016   Stable mild-to-moderate disease with bilateral heterogeneous plaque. RICA - 1-39%, LICA 40-59%. Bilateral subclavian and vertebral arteries normal.    Immunization History  Administered Date(s) Administered   Fluad Quad(high Dose 65+) 06/23/2019   Influenza  Whole 07/20/2008   Influenza, High Dose Seasonal PF 07/13/2017, 06/11/2018   Influenza,inj,Quad PF,6+ Mos 08/24/2013, 07/26/2014, 06/18/2015, 07/14/2016   Pneumococcal Conjugate-13 05/10/2015   Pneumococcal Polysaccharide-23 10/16/2009   Zoster, Live 12/02/2013    MEDICATIONS/ALLERGIES   No outpatient medications have been marked as taking for the 03/15/21  encounter (Appointment) with Marykay Lex, MD.    Allergies  Allergen Reactions   Dust Mite Extract Other (See Comments) and Cough    Sneezing, also   Mold Extract [Trichophyton] Other (See Comments)    Sneezing, also   Pollen Extract Other (See Comments)    Sneezing, also   Tetracyclines & Related Other (See Comments)    Generic only (??)    SOCIAL HISTORY/FAMILY HISTORY   Reviewed in Epic:  Pertinent findings:  Social History   Tobacco Use   Smoking status: Former    Pack years: 0.00    Types: Cigarettes    Quit date: 02/10/1988    Years since quitting: 33.1   Smokeless tobacco: Never  Vaping Use   Vaping Use: Never used  Substance Use Topics   Alcohol use: No   Drug use: No   Social History   Social History Narrative   Lives at home w/ wife   Right-handed   Caffeine: 5-6 cups per day    OBJCTIVE -PE, EKG, labs   Wt Readings from Last 3 Encounters:  07/15/20 147 lb 0.8 oz (66.7 kg)  05/10/20 147 lb 0.8 oz (66.7 kg)  01/31/20 142 lb (64.4 kg)    Physical Exam: There were no vitals taken for this visit. Physical Exam   Adult ECG Report  Rate: *** ;  Rhythm: {rhythm:17366};   Narrative Interpretation: ***  Recent Labs:  ***  Lab Results  Component Value Date   CHOL 206 (H) 09/10/2018   HDL 44 09/10/2018   LDLCALC 121 (H) 09/10/2018   TRIG 205 (H) 09/10/2018   CHOLHDL 4.7 09/10/2018   Lab Results  Component Value Date   CREATININE 1.02 08/05/2020   BUN 24 (H) 08/05/2020   NA 141 08/05/2020   K 3.7 08/05/2020   CL 111 08/05/2020   CO2 23 08/05/2020   CBC Latest Ref Rng & Units 08/05/2020 08/04/2020 08/03/2020  WBC 4.0 - 10.5 K/uL 7.1 6.7 7.6  Hemoglobin 13.0 - 17.0 g/dL 12.8(L) 13.1 14.7  Hematocrit 39.0 - 52.0 % 38.6(L) 39.3 45.7  Platelets 150 - 400 K/uL 107(L) 96(L) 114(L)    Lab Results  Component Value Date   TSH 2.106 05/09/2020    ==================================================  COVID-19 Education: The signs and  symptoms of COVID-19 were discussed with the patient and how to seek care for testing (follow up with PCP or arrange E-visit).    I spent a total of ***minutes with the patient spent in direct patient consultation.  Additional time spent with chart review  / charting (studies, outside notes, etc): *** min Total Time: *** min  Current medicines are reviewed at length with the patient today.  (+/- concerns) ***  This visit occurred during the SARS-CoV-2 public health emergency.  Safety protocols were in place, including screening questions prior to the visit, additional usage of staff PPE, and extensive cleaning of exam room while observing appropriate contact time as indicated for disinfecting solutions.  Notice: This dictation was prepared with Dragon dictation along with smaller phrase technology. Any transcriptional errors that result from this process are unintentional and may not be corrected upon review.  Patient Instructions / Medication Changes &  Studies & Tests Ordered   There are no Patient Instructions on file for this visit.   Studies Ordered:   No orders of the defined types were placed in this encounter.    Bryan Lemma, M.D., M.S. Interventional Cardiologist   Pager # [phone] Phone # [phone] [address] [place], [place] [address]   Thank you for choosing [facility]!!

## 2021-05-16 ENCOUNTER — Encounter (HOSPITAL_COMMUNITY): Payer: Self-pay

## 2021-05-16 ENCOUNTER — Inpatient Hospital Stay (HOSPITAL_COMMUNITY)
Admission: EM | Admit: 2021-05-16 | Discharge: 2021-05-21 | DRG: 871 | Disposition: A | Discharge status: Skilled Nursing Facility | Source: Skilled Nursing Facility | Attending: Internal Medicine | Admitting: Internal Medicine

## 2021-05-16 ENCOUNTER — Emergency Department (HOSPITAL_COMMUNITY)

## 2021-05-16 DIAGNOSIS — L89620 Pressure ulcer of left heel, unstageable: Secondary | ICD-10-CM | POA: Diagnosis present

## 2021-05-16 DIAGNOSIS — Z66 Do not resuscitate: Secondary | ICD-10-CM | POA: Diagnosis present

## 2021-05-16 DIAGNOSIS — G9341 Metabolic encephalopathy: Secondary | ICD-10-CM | POA: Diagnosis present

## 2021-05-16 DIAGNOSIS — I10 Essential (primary) hypertension: Secondary | ICD-10-CM | POA: Diagnosis present

## 2021-05-16 DIAGNOSIS — N281 Cyst of kidney, acquired: Secondary | ICD-10-CM | POA: Diagnosis present

## 2021-05-16 DIAGNOSIS — N179 Acute kidney failure, unspecified: Secondary | ICD-10-CM | POA: Diagnosis not present

## 2021-05-16 DIAGNOSIS — F0281 Dementia in other diseases classified elsewhere with behavioral disturbance: Secondary | ICD-10-CM | POA: Diagnosis present

## 2021-05-16 DIAGNOSIS — Z823 Family history of stroke: Secondary | ICD-10-CM | POA: Diagnosis not present

## 2021-05-16 DIAGNOSIS — Z833 Family history of diabetes mellitus: Secondary | ICD-10-CM

## 2021-05-16 DIAGNOSIS — E1165 Type 2 diabetes mellitus with hyperglycemia: Secondary | ICD-10-CM | POA: Diagnosis present

## 2021-05-16 DIAGNOSIS — A419 Sepsis, unspecified organism: Secondary | ICD-10-CM | POA: Diagnosis present

## 2021-05-16 DIAGNOSIS — Z87891 Personal history of nicotine dependence: Secondary | ICD-10-CM | POA: Diagnosis not present

## 2021-05-16 DIAGNOSIS — R739 Hyperglycemia, unspecified: Secondary | ICD-10-CM | POA: Diagnosis present

## 2021-05-16 DIAGNOSIS — R0902 Hypoxemia: Secondary | ICD-10-CM | POA: Diagnosis present

## 2021-05-16 DIAGNOSIS — Z83438 Family history of other disorder of lipoprotein metabolism and other lipidemia: Secondary | ICD-10-CM

## 2021-05-16 DIAGNOSIS — Z8249 Family history of ischemic heart disease and other diseases of the circulatory system: Secondary | ICD-10-CM

## 2021-05-16 DIAGNOSIS — Z8616 Personal history of COVID-19: Secondary | ICD-10-CM

## 2021-05-16 DIAGNOSIS — I2510 Atherosclerotic heart disease of native coronary artery without angina pectoris: Secondary | ICD-10-CM | POA: Diagnosis present

## 2021-05-16 DIAGNOSIS — J690 Pneumonitis due to inhalation of food and vomit: Secondary | ICD-10-CM | POA: Diagnosis present

## 2021-05-16 DIAGNOSIS — G309 Alzheimer's disease, unspecified: Secondary | ICD-10-CM | POA: Diagnosis present

## 2021-05-16 DIAGNOSIS — Z993 Dependence on wheelchair: Secondary | ICD-10-CM

## 2021-05-16 DIAGNOSIS — N151 Renal and perinephric abscess: Secondary | ICD-10-CM | POA: Diagnosis present

## 2021-05-16 DIAGNOSIS — Z794 Long term (current) use of insulin: Secondary | ICD-10-CM | POA: Diagnosis not present

## 2021-05-16 DIAGNOSIS — J189 Pneumonia, unspecified organism: Secondary | ICD-10-CM

## 2021-05-16 DIAGNOSIS — R6520 Severe sepsis without septic shock: Secondary | ICD-10-CM | POA: Diagnosis present

## 2021-05-16 DIAGNOSIS — R778 Other specified abnormalities of plasma proteins: Secondary | ICD-10-CM

## 2021-05-16 DIAGNOSIS — J188 Other pneumonia, unspecified organism: Secondary | ICD-10-CM

## 2021-05-16 DIAGNOSIS — Z515 Encounter for palliative care: Secondary | ICD-10-CM

## 2021-05-16 DIAGNOSIS — R7989 Other specified abnormal findings of blood chemistry: Secondary | ICD-10-CM

## 2021-05-16 LAB — COMPREHENSIVE METABOLIC PANEL
ALT: 22 U/L (ref 0–44)
AST: 29 U/L (ref 15–41)
Albumin: 2.4 g/dL — ABNORMAL LOW (ref 3.5–5.0)
Alkaline Phosphatase: 60 U/L (ref 38–126)
Anion gap: 9 (ref 5–15)
BUN: 30 mg/dL — ABNORMAL HIGH (ref 8–23)
CO2: 23 mmol/L (ref 22–32)
Calcium: 8.2 mg/dL — ABNORMAL LOW (ref 8.9–10.3)
Chloride: 111 mmol/L (ref 98–111)
Creatinine, Ser: 1.4 mg/dL — ABNORMAL HIGH (ref 0.61–1.24)
GFR, Estimated: 52 mL/min — ABNORMAL LOW (ref >60)
Glucose, Bld: 267 mg/dL — ABNORMAL HIGH (ref 70–99)
Potassium: 3.9 mmol/L (ref 3.5–5.1)
Sodium: 143 mmol/L (ref 135–145)
Total Bilirubin: 1.9 mg/dL — ABNORMAL HIGH (ref 0.3–1.2)
Total Protein: 6.2 g/dL — ABNORMAL LOW (ref 6.5–8.1)

## 2021-05-16 LAB — CBC WITH DIFFERENTIAL/PLATELET
Abs Immature Granulocytes: 0.03 10*3/uL (ref 0.00–0.07)
Basophils Absolute: 0 10*3/uL (ref 0.0–0.1)
Basophils Relative: 0 %
Eosinophils Absolute: 0 10*3/uL (ref 0.0–0.5)
Eosinophils Relative: 0 %
HCT: 39.2 % (ref 39.0–52.0)
Hemoglobin: 12.6 g/dL — ABNORMAL LOW (ref 13.0–17.0)
Immature Granulocytes: 0 %
Lymphocytes Relative: 9 %
Lymphs Abs: 0.8 10*3/uL (ref 0.7–4.0)
MCH: 28.5 pg (ref 26.0–34.0)
MCHC: 32.1 g/dL (ref 30.0–36.0)
MCV: 88.7 fL (ref 80.0–100.0)
Monocytes Absolute: 0.6 10*3/uL (ref 0.1–1.0)
Monocytes Relative: 7 %
Neutro Abs: 7.3 10*3/uL (ref 1.7–7.7)
Neutrophils Relative %: 84 %
Platelets: 164 10*3/uL (ref 150–400)
RBC: 4.42 MIL/uL (ref 4.22–5.81)
RDW: 14.5 % (ref 11.5–15.5)
WBC: 8.7 10*3/uL (ref 4.0–10.5)
nRBC: 0 % (ref 0.0–0.2)

## 2021-05-16 LAB — CBG MONITORING, ED
Glucose-Capillary: 263 mg/dL — ABNORMAL HIGH (ref 70–99)
Glucose-Capillary: 181 mg/dL — ABNORMAL HIGH (ref 70–99)
Glucose-Capillary: 161 mg/dL — ABNORMAL HIGH (ref 70–99)

## 2021-05-16 LAB — I-STAT VENOUS BLOOD GAS, ED
Acid-base deficit: 2 mmol/L (ref 0.0–2.0)
Bicarbonate: 22 mmol/L (ref 20.0–28.0)
Calcium, Ion: 1.07 mmol/L — ABNORMAL LOW (ref 1.15–1.40)
HCT: 34 % — ABNORMAL LOW (ref 39.0–52.0)
Hemoglobin: 11.6 g/dL — ABNORMAL LOW (ref 13.0–17.0)
O2 Saturation: 93 %
Potassium: 4.1 mmol/L (ref 3.5–5.1)
Sodium: 144 mmol/L (ref 135–145)
TCO2: 23 mmol/L (ref 22–32)
pCO2, Ven: 35.5 mmHg — ABNORMAL LOW (ref 44.0–60.0)
pH, Ven: 7.4 (ref 7.250–7.430)
pO2, Ven: 65 mmHg — ABNORMAL HIGH (ref 32.0–45.0)

## 2021-05-16 LAB — PROTIME-INR
INR: 1.4 — ABNORMAL HIGH (ref 0.8–1.2)
Prothrombin Time: 16.9 seconds — ABNORMAL HIGH (ref 11.4–15.2)

## 2021-05-16 LAB — HEMOGLOBIN A1C
Hgb A1c MFr Bld: 8 % — ABNORMAL HIGH (ref 4.8–5.6)
Mean Plasma Glucose: 182.9 mg/dL

## 2021-05-16 LAB — URINALYSIS, ROUTINE W REFLEX MICROSCOPIC
Bilirubin Urine: NEGATIVE
Glucose, UA: 50 mg/dL — AB
Ketones, ur: 5 mg/dL — AB
Nitrite: POSITIVE — AB
Protein, ur: 100 mg/dL — AB
RBC / HPF: >50 RBC/hpf — ABNORMAL HIGH (ref 0–5)
Specific Gravity, Urine: 1.036 — ABNORMAL HIGH (ref 1.005–1.030)
pH: 5 (ref 5.0–8.0)

## 2021-05-16 LAB — CBC
HCT: 36.2 % — ABNORMAL LOW (ref 39.0–52.0)
Hemoglobin: 11.4 g/dL — ABNORMAL LOW (ref 13.0–17.0)
MCH: 28.5 pg (ref 26.0–34.0)
MCHC: 31.5 g/dL (ref 30.0–36.0)
MCV: 90.5 fL (ref 80.0–100.0)
Platelets: 162 10*3/uL (ref 150–400)
RBC: 4 MIL/uL — ABNORMAL LOW (ref 4.22–5.81)
RDW: 14.6 % (ref 11.5–15.5)
WBC: 8.5 10*3/uL (ref 4.0–10.5)
nRBC: 0 % (ref 0.0–0.2)

## 2021-05-16 LAB — RESP PANEL BY RT-PCR (FLU A&B, COVID) ARPGX2
Influenza A by PCR: NEGATIVE
Influenza B by PCR: NEGATIVE
SARS Coronavirus 2 by RT PCR: POSITIVE — AB

## 2021-05-16 LAB — CREATININE, SERUM
Creatinine, Ser: 1.24 mg/dL (ref 0.61–1.24)
GFR, Estimated: 60 mL/min — ABNORMAL LOW (ref >60)

## 2021-05-16 LAB — APTT: aPTT: 37 seconds — ABNORMAL HIGH (ref 24–36)

## 2021-05-16 LAB — TROPONIN I (HIGH SENSITIVITY)
Troponin I (High Sensitivity): 58 ng/L — ABNORMAL HIGH (ref <18)
Troponin I (High Sensitivity): 54 ng/L — ABNORMAL HIGH (ref <18)

## 2021-05-16 LAB — LACTIC ACID, PLASMA: Lactic Acid, Venous: 1.3 mmol/L (ref 0.5–1.9)

## 2021-05-16 LAB — OSMOLALITY: Osmolality: 317 mOsm/kg — ABNORMAL HIGH (ref 275–295)

## 2021-05-16 MED ORDER — ENOXAPARIN SODIUM 40 MG/0.4ML IJ SOSY
40.0000 mg | PREFILLED_SYRINGE | INTRAMUSCULAR | Status: DC
  Administered 2021-05-16 – 2021-05-20 (×5): 40 mg via SUBCUTANEOUS
  Filled 2021-05-16 (×5): qty 0.4

## 2021-05-16 MED ORDER — MEMANTINE HCL 10 MG PO TABS
10.0000 mg | ORAL_TABLET | Freq: Two times a day (BID) | ORAL | Status: DC
  Administered 2021-05-17 – 2021-05-21 (×5): 10 mg via ORAL
  Filled 2021-05-16 (×8): qty 1

## 2021-05-16 MED ORDER — SODIUM CHLORIDE 0.9 % IV SOLN
2.0000 g | Freq: Once | INTRAVENOUS | Status: AC
  Administered 2021-05-16: 2 g via INTRAVENOUS
  Filled 2021-05-16: qty 2

## 2021-05-16 MED ORDER — VANCOMYCIN HCL 500 MG/100ML IV SOLN
500.0000 mg | Freq: Two times a day (BID) | INTRAVENOUS | Status: DC
  Administered 2021-05-17 – 2021-05-21 (×9): 500 mg via INTRAVENOUS
  Filled 2021-05-16 (×11): qty 100

## 2021-05-16 MED ORDER — ONDANSETRON HCL 4 MG/2ML IJ SOLN: 4.0000 mg | Freq: Four times a day (QID) | INTRAMUSCULAR | Status: DC | PRN

## 2021-05-16 MED ORDER — LACTATED RINGERS IV SOLN: INTRAVENOUS | Status: DC

## 2021-05-16 MED ORDER — LACTATED RINGERS IV SOLN: INTRAVENOUS | Status: AC

## 2021-05-16 MED ORDER — INSULIN ASPART 100 UNIT/ML IJ SOLN
3.0000 [IU] | Freq: Once | INTRAMUSCULAR | Status: AC
  Administered 2021-05-16: 3 [IU] via SUBCUTANEOUS

## 2021-05-16 MED ORDER — ACETAMINOPHEN 650 MG RE SUPP
650.0000 mg | Freq: Once | RECTAL | Status: AC
  Administered 2021-05-16: 650 mg via RECTAL
  Filled 2021-05-16: qty 1

## 2021-05-16 MED ORDER — ACETAMINOPHEN 650 MG RE SUPP: 650.0000 mg | Freq: Four times a day (QID) | RECTAL | Status: DC | PRN

## 2021-05-16 MED ORDER — INSULIN ASPART 100 UNIT/ML IJ SOLN
0.0000 [IU] | INTRAMUSCULAR | Status: DC
  Administered 2021-05-16 – 2021-05-18 (×11): 2 [IU] via SUBCUTANEOUS
  Administered 2021-05-18: 1 [IU] via SUBCUTANEOUS
  Administered 2021-05-19 (×2): 5 [IU] via SUBCUTANEOUS
  Administered 2021-05-19: 1 [IU] via SUBCUTANEOUS
  Administered 2021-05-19: 3 [IU] via SUBCUTANEOUS
  Administered 2021-05-19 (×2): 2 [IU] via SUBCUTANEOUS
  Administered 2021-05-20 (×3): 3 [IU] via SUBCUTANEOUS
  Administered 2021-05-20: 5 [IU] via SUBCUTANEOUS
  Administered 2021-05-20: 7 [IU] via SUBCUTANEOUS
  Administered 2021-05-20 – 2021-05-21 (×2): 3 [IU] via SUBCUTANEOUS
  Administered 2021-05-21: 2 [IU] via SUBCUTANEOUS
  Administered 2021-05-21: 3 [IU] via SUBCUTANEOUS
  Administered 2021-05-21: 5 [IU] via SUBCUTANEOUS

## 2021-05-16 MED ORDER — METRONIDAZOLE 500 MG/100ML IV SOLN
500.0000 mg | Freq: Once | INTRAVENOUS | Status: AC
  Administered 2021-05-16: 500 mg via INTRAVENOUS
  Filled 2021-05-16: qty 100

## 2021-05-16 MED ORDER — ONDANSETRON HCL 4 MG PO TABS: 4.0000 mg | ORAL_TABLET | Freq: Four times a day (QID) | ORAL | Status: DC | PRN

## 2021-05-16 MED ORDER — VANCOMYCIN HCL 1250 MG/250ML IV SOLN
1250.0000 mg | Freq: Once | INTRAVENOUS | Status: AC
  Administered 2021-05-16: 1250 mg via INTRAVENOUS
  Filled 2021-05-16: qty 250

## 2021-05-16 MED ORDER — LACTATED RINGERS IV BOLUS (SEPSIS)
1000.0000 mL | Freq: Once | INTRAVENOUS | Status: AC
  Administered 2021-05-16: 1000 mL via INTRAVENOUS

## 2021-05-16 MED ORDER — ACETAMINOPHEN 325 MG PO TABS: 650.0000 mg | ORAL_TABLET | Freq: Four times a day (QID) | ORAL | Status: DC | PRN

## 2021-05-16 MED ORDER — HALOPERIDOL LACTATE 5 MG/ML IJ SOLN: 2.0000 mg | Freq: Four times a day (QID) | INTRAMUSCULAR | Status: DC | PRN

## 2021-05-16 MED ORDER — SODIUM CHLORIDE 0.9 % IV SOLN
2.0000 g | Freq: Two times a day (BID) | INTRAVENOUS | Status: DC
  Administered 2021-05-16 – 2021-05-21 (×10): 2 g via INTRAVENOUS
  Filled 2021-05-16 (×10): qty 2

## 2021-05-16 MED ORDER — IOHEXOL 350 MG/ML SOLN
65.0000 mL | Freq: Once | INTRAVENOUS | Status: AC | PRN
  Administered 2021-05-16: 65 mL via INTRAVENOUS

## 2021-05-16 NOTE — Progress Notes (Addendum)
Brief Palliative Medicine Progress Note:  PMT consult received and chart reviewed.   Noted Dean Harris is a current hospice patient with [employer] [facility] I spoke with [facility] after hours liaison, [name], to notify the patient is in the ED and will be admitted. An [facility] representative will be coming to the hospital to discuss GOC as they have an established relationship with Dean Harris and family. They do not feel PMT involvement is needed at this time.  Per previous hospice notes, patient is DNR.   [facility] will reach out to PMT directly if any needs arise.   Thank you for allowing PMT to assist in the care of this patient.  [name] M. Katrinka Blazing [facility] Palliative Medicine Team Team Phone: [phone] NO CHARGE

## 2021-05-16 NOTE — ED Notes (Signed)
Patient transported to CT 

## 2021-05-16 NOTE — Progress Notes (Signed)
Pharmacy Antibiotic Note  Dean Harris is a 80 y.o. male admitted on 05/16/2021 with sepsis secondary to unknown source.  Pharmacy has been consulted for Cefepime and vancomycin dosing x 7 day course.  SCr 1.4 (BL ~ 1), WBC wnl, LA 1.3   Plan: -Cefepime 2 gm IV Q 12 hours -Vancomycin 1250 mg IV load followed by vancomycin 500 mg IV Q 12 hours per traditional nomogram  -Monitor CBC, renal fx, cultures and clinical progress -Vanc levels as indicated      Temp (24hrs), Avg:101.2 F (38.4 C), Min:101.2 F (38.4 C), Max:101.2 F (38.4 C)  No results for input(s): WBC, CREATININE, LATICACIDVEN, VANCOTROUGH, VANCOPEAK, VANCORANDOM, GENTTROUGH, GENTPEAK, GENTRANDOM, TOBRATROUGH, TOBRAPEAK, TOBRARND, AMIKACINPEAK, AMIKACINTROU, AMIKACIN in the last 168 hours.  CrCl cannot be calculated (Patient's most recent lab result is older than the maximum 21 days allowed.).    Allergies  Allergen Reactions   Dust Mite Extract Other (See Comments) and Cough    Sneezing, also   Mold Extract [Trichophyton] Other (See Comments)    Sneezing, also   Pollen Extract Other (See Comments)    Sneezing, also   Tetracyclines & Related Other (See Comments)    Generic only (??)    Antimicrobials this admission: Cefepime 9/1 >>  Vancomycin 9/1 >>   Dose adjustments this admission:  Microbiology results: 9/1 BCx:  9/1 UCx:     Thank you for allowing pharmacy to be a part of this patient's care.  Vinnie Level, PharmD., BCPS, BCCCP Clinical Pharmacist Please refer to [facility] for unit-specific pharmacist

## 2021-05-16 NOTE — ED Notes (Signed)
Pt continues to sleep. NAD noted. 

## 2021-05-16 NOTE — ED Notes (Signed)
Unable to obtain red top blood culture from second site, full set obtained from hospital placed IV site using Kurin device, blue top obtained from second site using kurin device and both sets sent to lab.

## 2021-05-16 NOTE — ED Triage Notes (Signed)
Pt BIB [facility] after call for pt needing CXR, EMS reports pt was minimally responsive on arrival, tachypnic, diaphoretic, warm to touch.   EMS administered 1L NS en route, pt more awake at this time. GCS 13.  Rectal temp 101.2 at this time, 89% on 4L, HR 110

## 2021-05-16 NOTE — ED Notes (Signed)
Family removed BP cuff due to patients increasing agitation and history of combative behavior.

## 2021-05-16 NOTE — ED Provider Notes (Signed)
[facility] EMERGENCY DEPARTMENT Provider Note   CSN: [id number] Arrival date & time: 05/16/21  1213     History Chief Complaint  Patient presents with   Altered Mental Status   Fever    Dean Harris is a 80 y.o. male with past medical history significant for hypertension, diabetes, CAD, dementia with behavioral disturbances who presents for evaluation of fever.  Apparently patient diagnosed with COVID approximately 2 weeks ago.  Was at his baseline up until 3 days ago.  At that time patient was made DNR by his granddaughters, medical POA. Has been on hospice for quite some time per family.   Apparently hospice nurse told family patient my be "transitioning" Patient's daughters stated that he had an episode of spitting up and choking per facility and they thought he had possibly aspirated.  He was started on azithromycin.  Family states patient has become progressively more lethargic.  He is not conversational at baseline due to his dementia.  Does have frequent outbursts of behavior as well.  Family had requested IV fluids and a chest x-ray with patient.  On EMS arrival patient was noted to be febrile, tachycardic, tachypneic and hypoxic.  He was subsequently brought here to the emergency department  DNR at bedside  Family at bedside.  They state they would want IV fluids, IV antibiotics and intubation however would not want chest compressions.    EMS>> 1 L IVF, 4 L [place], not following commands, course lung sounds, no emesis. At baseline per facility only occasionally follows commands   Update >>>Granddaughter Dean Harris>> Patient made DNR/DNI   LEVEL 5 CAVEAT- Dementia/ AMS  PCP- [facility]    HPI     Past Medical History:  Diagnosis Date   Chronic stable angina (HCC)    Coronary artery disease, non-occlusive 01/2012   Diffuse percent LAD, OM1 and mid RCA 50% lesions   Depression    Diabetes mellitus    Essential  hypertension    Glaucoma    Grade I diastolic dysfunction    Hyperlipidemia with target LDL less than 70    Memory difficulties    Moderate calcific aortic stenosis 01/2012   01/2012: Echo - mean/peak gradient12/21 mmHg; 01/2016: Moderate aortic stenosis (mean/peak gradient 24/35 mmHg)    Pneumonia    x2   PTSD (post-traumatic stress disorder) - with depression symptoms    [facility] This has helped his memory issues.   Thrombocytopenia (HCC) 04/29/2014   Vitamin D deficiency     Patient Active Problem List   Diagnosis Date Noted   Hyperosmolar hyperglycemic state (HHS) (HCC) 08/02/2020   Pressure injury of skin 08/01/2020   Type 2 diabetes mellitus with hyperosmolar hyperglycemic state (HHS) (HCC) 07/31/2020   Hypertension associated with diabetes (HCC) 07/31/2020   Hypernatremia 07/31/2020   Hyperkalemia 07/31/2020   Acute subdural hematoma (HCC) 05/10/2020   Hyperlipidemia with target LDL less than 70    Glaucoma    Grade I diastolic dysfunction    AKI (acute kidney injury) (HCC)    Dementia with behavioral disturbance (HCC)    AMS (altered mental status) 01/11/2020   CAP (community acquired pneumonia) 01/10/2020   Weight loss, non-intentional 05/20/2019   Depression, recurrent (HCC) 12/01/2017   Memory disorder 12/01/2017   Carotid artery plaque, bilateral; s/p Bilateral CEA 07/24/2016   Syncope 01/28/2016   Moderate aortic stenosis 01/28/2016   Chronic stable angina (HCC) 01/28/2016   BMI 30.0-30.9,adult  07/09/2015   Thrombocytopenia (HCC) 04/29/2014   Essential hypertension, benign 03/03/2014   Coronary artery disease, non-occlusive    Sleep apnea 11/30/2010   Gout 11/30/2010   Hyperlipidemia associated with type 2 diabetes mellitus (HCC) 11/30/2010   Rosacea 11/30/2010   COPD (chronic obstructive pulmonary disease) (HCC) 11/30/2010   ED (erectile dysfunction) 11/30/2010   Peripheral neuropathy 11/30/2010   Type 2 diabetes mellitus  (HCC) 09/16/1999    Past Surgical History:  Procedure Laterality Date   CAROTID ENDARTERECTOMY     LEFT HEART CATHETERIZATION WITH CORONARY ANGIOGRAM N/A 02/13/2012   Procedure: LEFT HEART CATHETERIZATION WITH CORONARY ANGIOGRAM;  Surgeon: Donato Schultz, MD;  Location: [facility] CATH LAB;  Service: Cardiovascular: Diffuse mid LAD 50%, 50%pOM1, 50%mRCA -> medical management   TRANSTHORACIC ECHOCARDIOGRAM  01/2012   EF 55-60%. Mild LVH. No RWMA, mild Aortic Stenosis (mean/peak gradient 13 mmHg/21 mmHg)   TRANSTHORACIC ECHOCARDIOGRAM  May 2017, May 2019   A) EF 60-65% w/o RWMA. Normal DF for age. Mod AD (mean/peak gradient 24/35 mmHg); b) EF 60-65%, mod LVH, Mod AS (Mean gradient (S): 28 mm Hg. Peak 43 mmHg   US CAROTID DOPPLER BILATERAL ([facility] HX) Bilateral 02/05/2016   Stable mild-to-moderate disease with bilateral heterogeneous plaque. RICA - 1-39%, LICA 40-59%. Bilateral subclavian and vertebral arteries normal.       Family History  Problem Relation Age of Onset   Diabetes Mother    Stroke Mother    Hypertension Mother    Hyperlipidemia Mother    Cancer Father    Hypertension Brother    Cancer Other     Social History   Tobacco Use   Smoking status: Former    Types: Cigarettes    Quit date: 02/10/1988    Years since quitting: 33.2   Smokeless tobacco: Never  Vaping Use   Vaping Use: Never used  Substance Use Topics   Alcohol use: No   Drug use: No    Home Medications Prior to Admission medications   Medication Sig Start Date End Date Taking? Authorizing Provider  donepezil (ARICEPT) 10 MG tablet TAKE 1/2 (ONE-HALF) TABLET BY MOUTH AT BEDTIME (Needs to be seen before next refill) Patient taking differently: Take 5 mg by mouth at bedtime. 03/22/20  Yes Martin, Mary-Margaret, FNP  guaifenesin (ROBITUSSIN) 100 MG/5ML syrup Take 200 mg by mouth every 6 (six) hours as needed for cough.   Yes [provider]  haloperidol (HALDOL) 0.5 MG tablet Take 0.5 mg by mouth 2 (two)  times daily as needed for agitation (agitation).   Yes [provider]  insulin glargine (LANTUS) 100 UNIT/ML injection Inject 0.1 mLs (10 Units total) into the skin daily. Patient taking differently: Inject 20 Units into the skin daily. Inject 20 units subcutaneously at 5pm 08/07/20  Yes Rolly Salter, MD  isosorbide dinitrate (ISORDIL) 30 MG tablet Take 30 mg by mouth daily. 03/03/21  Yes [provider]  lisinopril (ZESTRIL) 2.5 MG tablet Take 2.5 mg by mouth daily. 03/04/21  Yes [provider]  loperamide (IMODIUM) 2 MG capsule Take 2 mg by mouth as needed (with each loose stool/diarrhea- Max of 8 doses/24 hours- REPORT ANY BLOOD).   Yes [provider]  magnesium hydroxide (MILK OF MAGNESIA) 400 MG/5ML suspension Take 30 mLs by mouth at bedtime as needed for mild constipation.   Yes [provider]  memantine (NAMENDA) 10 MG tablet Take 1 tablet (10 mg total) by mouth 2 (two) times daily. 03/22/20  Yes Bennie Pierini, FNP  Menthol-Zinc Oxide (REMEDY CALAZIME) 0.4-20.5 % PSTE Apply 1 application topically See admin instructions. Apply to the sacrum 3 times a day AND three times a day as needed after incontinence   Yes [provider]  sertraline (ZOLOFT) 100 MG tablet Take 2 tablets (200 mg total) by mouth in the morning. 03/22/20  Yes Daphine Deutscher, Mary-Margaret, FNP  acetaminophen (TYLENOL) 500 MG tablet Take 500 mg by mouth every 6 (six) hours as needed (for fever 99.5-101 F, minor headaches or discomfort- report any fever >101 F, severe headache or discomfort).    [provider]  [employer] [facility]) OINT Apply 1 application topically See admin instructions. Apply topically every 3 hours- with every incontinent change    [provider]  Insulin Syringe-Needle U-100 (INSULIN SYRINGE .3CC/31GX5/16") 31G X 5/16" 0.3 ML MISC 1 Act by Does not apply route daily. 08/06/20   Rolly Salter, MD  nitroGLYCERIN  (NITROSTAT) 0.4 MG SL tablet Use 1 tabket under tongue at onset and may repeat every 5 minutes x2 Patient taking differently: Place 0.4 mg under the tongue every 5 (five) minutes x 3 doses as needed for chest pain (CALL MD, IF NO RELIEF).  02/15/20   Daphine Deutscher, Mary-Margaret, FNP  Travoprost, BAK Free, (TRAVATAN) 0.004 % SOLN ophthalmic solution Place 1 drop into both eyes at bedtime. 03/22/20   Daphine Deutscher Mary-Margaret, FNP    Allergies    Dust mite extract, Mold extract [trichophyton], Pollen extract, and Tetracyclines & related  Review of Systems   Review of Systems  Unable to perform ROS: Dementia  All other systems reviewed and are negative.  Physical Exam Updated Vital Signs BP (!) 150/83   Pulse 90   Temp (!) 101.2 F (38.4 C) (Rectal)   Resp 20   SpO2 94%   Physical Exam Constitutional:      General: He is in acute distress.     Appearance: He is ill-appearing and toxic-appearing.  HENT:     Head: Normocephalic.     Nose: Nose normal.     Mouth/Throat:     Mouth: Mucous membranes are dry.     Comments: Dry mucous membrane Cardiovascular:     Rate and Rhythm: Tachycardia present.     Pulses: Normal pulses.     Heart sounds: Normal heart sounds.  Pulmonary:     Effort: Tachypnea present.     Breath sounds: Decreased air movement present. Rhonchi present.     Comments: Course lung sounds, decreased on right. 4 L [place], tachypnic Chest:     Comments: Equal rise and fall to chest wall Abdominal:     General: Bowel sounds are normal.     Palpations: Abdomen is soft.     Tenderness: There is no abdominal tenderness. There is no guarding or rebound.     Comments: Soft non tender  Musculoskeletal:     Cervical back: Normal range of motion.     Comments: No bony tenderness.   Skin:    General: Skin is warm.     Capillary Refill: Capillary refill takes more than 3 seconds.     Comments: Sacral ulcer present without drainage   Neurological:     Mental Status: He is disoriented  and confused.     Comments: Responsive to painful stimuli. Does not follow commands. No obvious facial droop    ED Results / Procedures / Treatments   Labs (all labs ordered are listed, but only abnormal results are displayed) Labs Reviewed  COMPREHENSIVE METABOLIC  PANEL - Abnormal; Notable for the following components:      Result Value   Glucose, Bld 267 (*)    BUN 30 (*)    Creatinine, Ser 1.40 (*)    Calcium 8.2 (*)    Total Protein 6.2 (*)    Albumin 2.4 (*)    Total Bilirubin 1.9 (*)    GFR, Estimated 52 (*)    All other components within normal limits  CBC WITH DIFFERENTIAL/PLATELET - Abnormal; Notable for the following components:   Hemoglobin 12.6 (*)    All other components within normal limits  PROTIME-INR - Abnormal; Notable for the following components:   Prothrombin Time 16.9 (*)    INR 1.4 (*)    All other components within normal limits  APTT - Abnormal; Notable for the following components:   aPTT 37 (*)    All other components within normal limits  CBG MONITORING, ED - Abnormal; Notable for the following components:   Glucose-Capillary 263 (*)    All other components within normal limits  TROPONIN I (HIGH SENSITIVITY) - Abnormal; Notable for the following components:   Troponin I (High Sensitivity) 58 (*)    All other components within normal limits  RESP PANEL BY RT-PCR (FLU A&B, COVID) ARPGX2  CULTURE, BLOOD (ROUTINE X 2)  CULTURE, BLOOD (ROUTINE X 2)  URINE CULTURE  LACTIC ACID, PLASMA  LACTIC ACID, PLASMA  URINALYSIS, ROUTINE W REFLEX MICROSCOPIC  MISCELLANEOUS GENETIC TEST  OSMOLALITY  I-STAT VENOUS BLOOD GAS, ED  TROPONIN I (HIGH SENSITIVITY)    EKG EKG Interpretation  Date/Time:  Thursday May 16 2021 12:23:38 EDT Ventricular Rate:  110 PR Interval:  150 QRS Duration: 97 QT Interval:  357 QTC Calculation: 483 R Axis:   120 Text Interpretation: Sinus tachycardia Right axis deviation Borderline prolonged QT interval Abnormal ECG  Confirmed by Gerhard MunchLockwood, Robert [phone]([id number]) on 05/16/2021 12:26:54 PM  Radiology CT HEAD WO CONTRAST (5MM)  Result Date: 05/16/2021 CLINICAL DATA:  Delirium EXAM: CT HEAD WITHOUT CONTRAST TECHNIQUE: Contiguous axial images were obtained from the base of the skull through the vertex without intravenous contrast. COMPARISON:  CT head 01/22/2021, brain MRI 01/11/2020 FINDINGS: Brain: There is no evidence of acute intracranial hemorrhage, extra-axial fluid collection, or infarct. There is moderate to severe global parenchymal volume loss with ex vacuo dilatation of the ventricular system, not significantly changed. Hypodensity in the subcortical and periventricular white matter is also not significantly changed, likely reflecting sequela of chronic white matter microangiopathy. There is no mass lesion. There is no midline shift. Vascular: There is calcification of the bilateral cavernous ICAs. Skull: Normal. Negative for fracture or focal lesion. Sinuses/Orbits: The paranasal sinuses are clear. The globes and orbits are unremarkable. Other: There is a left mastoid effusion, unchanged. IMPRESSION: 1. No acute intracranial pathology. 2. Unchanged global parenchymal volume loss and chronic white matter microangiopathy. 3. Unchanged left mastoid effusion. Electronically Signed   By: Lesia HausenPeter  Noone M.D.   On: 05/16/2021 15:04   CT Angio Chest PE W/Cm &/Or Wo Cm  Result Date: 05/16/2021 CLINICAL DATA:  PE suspected, high probability. EXAM: CT ANGIOGRAPHY CHEST WITH CONTRAST TECHNIQUE: Multidetector CT imaging of the chest was performed using the standard protocol during bolus administration of intravenous contrast. Multiplanar CT image reconstructions and MIPs were obtained to evaluate the vascular anatomy. CONTRAST:  65mL OMNIPAQUE IOHEXOL 350 MG/ML SOLN COMPARISON:  CT abdomen/pelvis 01/10/2020 FINDINGS: Cardiovascular: Satisfactory opacification of the pulmonary arteries to the segmental level. No evidence of pulmonary  embolism. Normal  heart size. No pericardial effusion. Atherosclerotic calcifications present throughout the thoracic aorta, branch arteries and coronary arteries. Calcifications also present involving the aortic valve. Mediastinum/Nodes: Calcified hilar lymph nodes. No mediastinal mass. Unremarkable thoracic esophagus. Lungs/Pleura: New gas within the previously identified large right upper pole renal cyst. Additionally, the margin appears slightly thickened. Calcified granuloma in the periphery of the lingula and within the medial left lower lobe. Extensive bronchial wall thickening throughout the right lung. Upper Abdomen: New gas and wall thickening involving the known large right upper pole renal cyst. Punctate calcifications throughout the spleen consistent with sequelae of granulomatous disease. Musculoskeletal: No acute fracture or malalignment. 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 Review of the MIP images confirms the above findings. IMPRESSION: 1. Multi lobar pneumonia involving the right upper and right lower lobes. The right lower lobe is essentially completely consolidated. 2. New wall thickening and gas within the known large right upper pole renal cysts concerning for superinfection and abscess formation. This may be secondary complication in the setting of multilobar pneumonia and bacteremia. 3. No evidence of acute pulmonary embolus. 4. Aortic and coronary artery atherosclerotic calcifications. 5. Sequelae of old granulomatous disease with bilateral calcified hilar lymph nodes and punctate calcifications throughout the spleen. Aortic Atherosclerosis (ICD10-I70.0). Electronically Signed   By: Malachy Moan M.D.   On: 05/16/2021 15:52   DG Chest Port 1 View  Result Date: 05/16/2021 CLINICAL DATA:  Questionable sepsis - evaluate for abnormality EXAM: PORTABLE CHEST 1 VIEW COMPARISON:  Radiograph 07/31/2020 FINDINGS: The patient is  rotated towards the right. Unchanged cardiomediastinal silhouette. There is new opacification throughout the right lung, worst in the mid to lower lung. The left lung appears clear. There is no visible pneumothorax. There is no acute osseous abnormality. IMPRESSION: Opacification throughout the right hemithorax, worst in the right mid to lower lung, could represent pleural effusion with mid to lower lung airspace disease. Electronically Signed   By: Caprice Renshaw M.D.   On: 05/16/2021 13:21    Procedures .Critical Care  Date/Time: 05/16/2021 5:02 PM Performed by: Linwood Dibbles, PA-C Authorized by: Linwood Dibbles, PA-C   Critical care provider statement:    Critical care time (minutes):  45   Critical care was necessary to treat or prevent imminent or life-threatening deterioration of the following conditions:  Sepsis   Critical care was time spent personally by me on the following activities:  Discussions with consultants, evaluation of patient's response to treatment, examination of patient, ordering and performing treatments and interventions, ordering and review of laboratory studies, ordering and review of radiographic studies, pulse oximetry, re-evaluation of patient's condition, obtaining history from patient or surrogate and review of old charts   Medications Ordered in ED Medications  lactated ringers infusion ( Intravenous New Bag/Given 05/16/21 1500)  ceFEPIme (MAXIPIME) 2 g in sodium chloride 0.9 % 100 mL IVPB (has no administration in time range)  vancomycin (VANCOREADY) IVPB 500 mg/100 mL (has no administration in time range)  lactated ringers bolus 1,000 mL (0 mLs Intravenous Stopped 05/16/21 1518)  ceFEPIme (MAXIPIME) 2 g in sodium chloride 0.9 % 100 mL IVPB (0 g Intravenous Stopped 05/16/21 1432)  metroNIDAZOLE (FLAGYL) IVPB 500 mg (0 mg Intravenous Stopped 05/16/21 1445)  vancomycin (VANCOREADY) IVPB 1250 mg/250 mL (0 mg Intravenous Stopped 05/16/21 1640)  acetaminophen (TYLENOL)  suppository 650 mg (650 mg Rectal Given 05/16/21 1514)  iohexol (OMNIPAQUE) 350 MG/ML injection 65 mL (65 mLs Intravenous Contrast Given 05/16/21 1534)  insulin aspart (novoLOG) injection 3 Units (3 Units Subcutaneous Given 05/16/21 1638)   ED Course  I have reviewed the triage vital signs and the nursing notes.  Pertinent labs & imaging results that were available during my care of the patient were reviewed by me and considered in my medical decision making (see chart for details).  Here for evaluation of fever. Febrile, tachycardia, tachypnia, hypoxic on arrival. Hx of dementia with behavioral disturbances, intermittently follows commands at baseline does not speak however will grumble.  Code sepsis called on arrival.  Given broad-spectrum antibiotics, additional liter of IV fluids>> plan on broad work-up.  Labs and imaging personally reviewed and interpreted:  CBC without leukocytosis, hemoglobin 12.6 Metabolic panel glucose 267, creatinine 1.4 up from prior, T bili 1.9, baseline 1-1 0.7 Troponin 58 Lactic 1.3 CTA chest with multifocal pneumonia on the right.  Also with wall thickening right upper pole renal cyst concern for superimposed infection, possible abscess may be secondary to multilobar pneumonia bacteremia>> patient would not tolerate any surgical intervention, is under hospice care.  Family agreeable for trial of antibiotics UA positive for infection  Long discussion with attending physician and granddaughter, medical POA in room.  Patient made DNR, DNI by family. Family is agreeable for trial of IV fluids and antibiotics to see if patient improves.   Discussed with Elsie Saas with [facility], hospice who will follow along with patient while in hospital.  CONSULT with Dr. Jomarie Longs with [facility] who agrees to evaluate patient for admission  The patient appears reasonably stabilized for admission considering the current resources, flow, and capabilities available in the ED at this  time, and I doubt any other [facility] requiring further screening and/or treatment in the ED prior to admission.   Patient seen and evaluated by attending Dr. Jeraldine Loots who agrees with above treatment, plan and disposition      MDM Rules/Calculators/A&P                           Oaklen Thiam was evaluated in Emergency Department on 05/16/2021 for the symptoms described in the history of present illness. He was evaluated in the context of the global COVID-19 pandemic, which necessitated consideration that the patient might be at risk for infection with the SARS-CoV-2 virus that causes COVID-19. Institutional protocols and algorithms that pertain to the evaluation of patients at risk for COVID-19 are in a state of rapid change based on information released by regulatory bodies including the CDC and federal and state organizations. These policies and algorithms were followed during the patient's care in the ED.  Final Clinical Impression(s) / ED Diagnoses Final diagnoses:  Multifocal pneumonia  Sepsis with acute renal failure without septic shock, due to unspecified organism, unspecified acute renal failure type (HCC)  Elevated troponin  Hyperglycemia  Alzheimer's dementia with behavioral disturbance, unspecified timing of dementia onset [facility])    Rx / DC Orders ED Discharge Orders     None        Zylah Elsbernd A, PA-C 05/16/21 1918    Gerhard Munch, MD 05/21/21 [phone]

## 2021-05-16 NOTE — Progress Notes (Signed)
Civil engineer, contracting [facility]) Hospital Liaison: RN note    This is a current hospice patient from [facility]. Hospital liaison will continue to follow while in hospital and if admitted.   A Please do not hesitate to call with questions.   Thank you,   Elsie Saas, RN, [place]      [facility] Liaison   [phone]

## 2021-05-16 NOTE — Progress Notes (Signed)
[facility])  Dean Harris is a current hospice patient with a terminal diagnosis of Alzheimer's Dementia.  For the past few days, he has been less responsive at his facility.  Today, his family requested he be sent to the ED for evaluation.  He has an OOF DNR with hospice.  His granddaughter is his Management consultant.  Wallis Bamberg RN, BSN, CCRN [facility] Liaison

## 2021-05-16 NOTE — ED Notes (Signed)
I introduced myself to pt and granddaughter. Pt not speaking, daughter said it's pt's baseline. Rechecked an oral temperature. Pt able to follow me with his eyes, but not really following commands (also baseline).

## 2021-05-16 NOTE — Sepsis Progress Note (Signed)
eLink is monitoring this Code Sepsis. °

## 2021-05-16 NOTE — ED Notes (Signed)
Pt appears to be asleep. NAD noted. 

## 2021-05-16 NOTE — H&P (Addendum)
History and Physical    Dean Harris YKD:[id number] DOB: [birth year] DOA: 05/16/2021  Referring MD/NP/PA: EDP PCP:  Patient coming from: Dean [facility], memory care unit  Chief Complaint: unresponsiveness  HPI: Dean Harris is a 80 year old male from [facility] with advanced dementia, nonverbal with behavioral disturbance, bedbound/wheelchair-bound, history of diabetes, hypertension, history of CAD, followed by [facility] for the last 4 to 5 months, was reportedly diagnosed with COVID 3 weeks ago, he had a mild cough but otherwise was relatively asymptomatic then, over the last couple of days had worsening cough, congestion and decreased responsiveness his family was notified, they wanted him evaluated and treated, subsequently brought to [name], ED ED Course: Was noted to be febrile, tachycardic, tachypneic, hypoxic with O2 sats in the high 80s, labs were notable for hyperglycemia, mild AKI, albumin of 2.4, urinalysis was grossly abnormal. . -Had a CT chest, upper abdomen which noted multilobar pneumonia involving the right upper and lower lobe, right lower lobe was essentially completely consolidated, also noted to have new wall thickening and gas within the known large right upper pole renal cyst concerning for superinfection and abscess formation    Review of Systems: Unable to obtain  Past Medical History:  Diagnosis Date   Chronic stable angina (HCC)    Coronary artery disease, non-occlusive 01/2012   Diffuse percent LAD, OM1 and mid RCA 50% lesions   Depression    Diabetes mellitus    Essential hypertension    Glaucoma    Grade I diastolic dysfunction    Hyperlipidemia with target LDL less than 70    Memory difficulties    Moderate calcific aortic stenosis 01/2012   01/2012: Echo - mean/peak gradient12/21 mmHg; 01/2016: Moderate aortic stenosis (mean/peak gradient 24/35 mmHg)    Pneumonia    x2   PTSD (post-traumatic stress  disorder) - with depression symptoms    [facility] This has helped his memory issues.   Thrombocytopenia (HCC) 04/29/2014   Vitamin D deficiency     Past Surgical History:  Procedure Laterality Date   CAROTID ENDARTERECTOMY     LEFT HEART CATHETERIZATION WITH CORONARY ANGIOGRAM N/A 02/13/2012   Procedure: LEFT HEART CATHETERIZATION WITH CORONARY ANGIOGRAM;  Surgeon: Dean Schultz, MD;  Location: [facility] CATH LAB;  Service: Cardiovascular: Diffuse mid LAD 50%, 50%pOM1, 50%mRCA -> medical management   TRANSTHORACIC ECHOCARDIOGRAM  01/2012   EF 55-60%. Mild LVH. No RWMA, mild Aortic Stenosis (mean/peak gradient 13 mmHg/21 mmHg)   TRANSTHORACIC ECHOCARDIOGRAM  May 2017, May 2019   A) EF 60-65% w/o RWMA. Normal DF for age. Mod AD (mean/peak gradient 24/35 mmHg); b) EF 60-65%, mod LVH, Mod AS (Mean gradient (S): 28 mm Hg. Peak 43 mmHg   US CAROTID DOPPLER BILATERAL ([facility] HX) Bilateral 02/05/2016   Stable mild-to-moderate disease with bilateral heterogeneous plaque. RICA - 1-39%, LICA 40-59%. Bilateral subclavian and vertebral arteries normal.     reports that he quit smoking about 33 years ago. His smoking use included cigarettes. He has never used smokeless tobacco. He reports that he does not drink alcohol and does not use drugs.  Allergies  Allergen Reactions   Dust Mite Extract Other (See Comments) and Cough    Sneezing, also   Mold Extract [Trichophyton] Other (See Comments)    Sneezing, also   Pollen Extract Other (See Comments)    Sneezing, also   Tetracyclines & Related Other (See Comments)    Generic only (??)  Family History  Problem Relation Age of Onset   Diabetes Mother    Stroke Mother    Hypertension Mother    Hyperlipidemia Mother    Cancer Father    Hypertension Brother    Cancer Other      Prior to Admission medications   Medication Sig Start Date End Date Taking? Authorizing Provider  acetaminophen (TYLENOL) 500 MG tablet Take 500 mg  by mouth every 6 (six) hours as needed (for fever 99.5-101 F, minor headaches or discomfort- report any fever >101 F, severe headache or discomfort).   Yes [provider]  donepezil (ARICEPT) 10 MG tablet TAKE 1/2 (ONE-HALF) TABLET BY MOUTH AT BEDTIME (Needs to be seen before next refill) Patient taking differently: Take 5 mg by mouth at bedtime. 03/22/20  Yes Martin, Mary-Margaret, FNP  guaifenesin (ROBITUSSIN) 100 MG/5ML syrup Take 200 mg by mouth every 6 (six) hours as needed for cough.   Yes [provider]  haloperidol (HALDOL) 0.5 MG tablet Take 0.5 mg by mouth 2 (two) times daily as needed for agitation (agitation).   Yes [provider]  Infant Care Products [facility]) OINT Apply 1 application topically See admin instructions. Apply topically every 3 hours- with every incontinent change   Yes [provider]  insulin glargine (LANTUS) 100 UNIT/ML injection Inject 0.1 mLs (10 Units total) into the skin daily. Patient taking differently: Inject 20 Units into the skin daily. Inject 20 units subcutaneously at 5pm 08/07/20  Yes Rolly Salter, MD  isosorbide dinitrate (ISORDIL) 30 MG tablet Take 30 mg by mouth daily. 03/03/21  Yes [provider]  lisinopril (ZESTRIL) 2.5 MG tablet Take 2.5 mg by mouth daily. 03/04/21  Yes [provider]  loperamide (IMODIUM) 2 MG capsule Take 2 mg by mouth as needed (with each loose stool/diarrhea- Max of 8 doses/24 hours- REPORT ANY BLOOD).   Yes [provider]  magnesium hydroxide (MILK OF MAGNESIA) 400 MG/5ML suspension Take 30 mLs by mouth at bedtime as needed for mild constipation.   Yes [provider]  memantine (NAMENDA) 10 MG tablet Take 1 tablet (10 mg total) by mouth 2 (two) times daily. 03/22/20  Yes Daphine Deutscher, Mary-Margaret, FNP  Menthol-Zinc Oxide (REMEDY CALAZIME) 0.4-20.5 % PSTE Apply 1 application topically See admin instructions. Apply to the sacrum 3 times a day AND three times a  day as needed after incontinence   Yes [provider]  nitroGLYCERIN (NITROSTAT) 0.4 MG SL tablet Use 1 tabket under tongue at onset and may repeat every 5 minutes x2 Patient taking differently: Place 0.4 mg under the tongue every 5 (five) minutes x 3 doses as needed for chest pain (CALL MD, IF NO RELIEF). 02/15/20  Yes Martin, Mary-Margaret, FNP  senna-docusate (SENOKOT-S) 8.6-50 MG tablet Take 1 tablet by mouth 2 (two) times daily.   Yes [provider]  sertraline (ZOLOFT) 100 MG tablet Take 2 tablets (200 mg total) by mouth in the morning. 03/22/20  Yes Martin, Mary-Margaret, FNP  Insulin Syringe-Needle U-100 (INSULIN SYRINGE .3CC/31GX5/16") 31G X 5/16" 0.3 ML MISC 1 Act by Does not apply route daily. 08/06/20   Rolly Salter, MD  Travoprost, BAK Free, (TRAVATAN) 0.004 % SOLN ophthalmic solution Place 1 drop into both eyes at bedtime. Patient not taking: Reported on 05/16/2021 03/22/20   Bennie Pierini, FNP    Physical Exam: Vitals:   05/16/21 1508 05/16/21 1515 05/16/21 1645 05/16/21 1706  BP: (!) 149/65 (!) 150/83  (!) 148/53  Pulse: 93  91 90 92  Resp: (!) 33 (!) 22 20 (!) 21  Temp:      TempSrc:      SpO2: 96% 96% 94% 91%      Constitutional: Chronically ill male eyes open, poorly responsive, does not follow commands, mumbles and groans from time to time  CVS: S1-S2, regular rhythm, tachycardic Lungs: Scattered bilateral rhonchi worse on the right  Abdomen: Soft, nontender, bowel sounds present Extremities: No edema Skin: No rashes on exposed skin, no obvious decubitus wounds on limited assessment   Labs on Admission: I have personally reviewed following labs and imaging studies  CBC: Recent Labs  Lab 05/16/21 1323 05/16/21 1706  WBC 8.7  --   NEUTROABS 7.3  --   HGB 12.6* 11.6*  HCT 39.2 34.0*  MCV 88.7  --   PLT 164  --    Basic Metabolic Panel: Recent Labs  Lab 05/16/21 1323 05/16/21 1706  NA 143 144  K 3.9 4.1  CL 111  --   CO2 23  --    GLUCOSE 267*  --   BUN 30*  --   CREATININE 1.40*  --   CALCIUM 8.2*  --    GFR: CrCl cannot be calculated (Unknown ideal weight.). Liver Function Tests: Recent Labs  Lab 05/16/21 1323  AST 29  ALT 22  ALKPHOS 60  BILITOT 1.9*  PROT 6.2*  ALBUMIN 2.4*   No results for input(s): LIPASE, AMYLASE in the last 168 hours. No results for input(s): AMMONIA in the last 168 hours. Coagulation Profile: Recent Labs  Lab 05/16/21 1323  INR 1.4*   Cardiac Enzymes: No results for input(s): CKTOTAL, CKMB, CKMBINDEX, TROPONINI in the last 168 hours. BNP (last 3 results) No results for input(s): PROBNP in the last 8760 hours. HbA1C: No results for input(s): HGBA1C in the last 72 hours. CBG: Recent Labs  Lab 05/16/21 1319  GLUCAP 263*   Lipid Profile: No results for input(s): CHOL, HDL, LDLCALC, TRIG, CHOLHDL, LDLDIRECT in the last 72 hours. Thyroid Function Tests: No results for input(s): TSH, T4TOTAL, FREET4, T3FREE, THYROIDAB in the last 72 hours. Anemia Panel: No results for input(s): VITAMINB12, FOLATE, FERRITIN, TIBC, IRON, RETICCTPCT in the last 72 hours. Urine analysis:    Component Value Date/Time   COLORURINE YELLOW 05/16/2021 1624   APPEARANCEUR CLOUDY (A) 05/16/2021 1624   LABSPEC 1.036 (H) 05/16/2021 1624   PHURINE 5.0 05/16/2021 1624   GLUCOSEU 50 (A) 05/16/2021 1624   HGBUR LARGE (A) 05/16/2021 1624   BILIRUBINUR NEGATIVE 05/16/2021 1624   KETONESUR 5 (A) 05/16/2021 1624   PROTEINUR 100 (A) 05/16/2021 1624   NITRITE POSITIVE (A) 05/16/2021 1624   LEUKOCYTESUR SMALL (A) 05/16/2021 1624   Sepsis Labs: @LABRCNTIP (procalcitonin:4,lacticidven:4) )No results found for this or any previous visit (from the past 240 hour(s)).   Radiological Exams on Admission: CT HEAD WO CONTRAST (5MM)  Result Date: 05/16/2021 CLINICAL DATA:  Delirium EXAM: CT HEAD WITHOUT CONTRAST TECHNIQUE: Contiguous axial images were obtained from the base of the skull through the vertex  without intravenous contrast. COMPARISON:  CT head 01/22/2021, brain MRI 01/11/2020 FINDINGS: Brain: There is no evidence of acute intracranial hemorrhage, extra-axial fluid collection, or infarct. There is moderate to severe global parenchymal volume loss with ex vacuo dilatation of the ventricular system, not significantly changed. Hypodensity in the subcortical and periventricular white matter is also not significantly changed, likely reflecting sequela of chronic white matter microangiopathy. There is no mass lesion. There is no midline shift. Vascular:  There is calcification of the bilateral cavernous ICAs. Skull: Normal. Negative for fracture or focal lesion. Sinuses/Orbits: The paranasal sinuses are clear. The globes and orbits are unremarkable. Other: There is a left mastoid effusion, unchanged. IMPRESSION: 1. No acute intracranial pathology. 2. Unchanged global parenchymal volume loss and chronic white matter microangiopathy. 3. Unchanged left mastoid effusion. Electronically Signed   By: Lesia Hausen M.D.   On: 05/16/2021 15:04   CT Angio Chest PE W/Cm &/Or Wo Cm  Result Date: 05/16/2021 CLINICAL DATA:  PE suspected, high probability. EXAM: CT ANGIOGRAPHY CHEST WITH CONTRAST TECHNIQUE: Multidetector CT imaging of the chest was performed using the standard protocol during bolus administration of intravenous contrast. Multiplanar CT image reconstructions and MIPs were obtained to evaluate the vascular anatomy. CONTRAST:  75mL OMNIPAQUE IOHEXOL 350 MG/ML SOLN COMPARISON:  CT abdomen/pelvis 01/10/2020 FINDINGS: Cardiovascular: Satisfactory opacification of the pulmonary arteries to the segmental level. No evidence of pulmonary embolism. Normal heart size. No pericardial effusion. Atherosclerotic calcifications present throughout the thoracic aorta, branch arteries and coronary arteries. Calcifications also present involving the aortic valve. Mediastinum/Nodes: Calcified hilar lymph nodes. No mediastinal  mass. Unremarkable thoracic esophagus. Lungs/Pleura: New gas within the previously identified large right upper pole renal cyst. Additionally, the margin appears slightly thickened. Calcified granuloma in the periphery of the lingula and within the medial left lower lobe. Extensive bronchial wall thickening throughout the right lung. Upper Abdomen: New gas and wall thickening involving the known large right upper pole renal cyst. Punctate calcifications throughout the spleen consistent with sequelae of granulomatous disease. Musculoskeletal: No acute fracture or malalignment. 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 Review of the MIP images confirms the above findings. IMPRESSION: 1. Multi lobar pneumonia involving the right upper and right lower lobes. The right lower lobe is essentially completely consolidated. 2. New wall thickening and gas within the known large right upper pole renal cysts concerning for superinfection and abscess formation. This may be secondary complication in the setting of multilobar pneumonia and bacteremia. 3. No evidence of acute pulmonary embolus. 4. Aortic and coronary artery atherosclerotic calcifications. 5. Sequelae of old granulomatous disease with bilateral calcified hilar lymph nodes and punctate calcifications throughout the spleen. Aortic Atherosclerosis (ICD10-I70.0). Electronically Signed   By: Malachy Moan M.D.   On: 05/16/2021 15:52   DG Chest Port 1 View  Result Date: 05/16/2021 CLINICAL DATA:  Questionable sepsis - evaluate for abnormality EXAM: PORTABLE CHEST 1 VIEW COMPARISON:  Radiograph 07/31/2020 FINDINGS: The patient is rotated towards the right. Unchanged cardiomediastinal silhouette. There is new opacification throughout the right lung, worst in the mid to lower lung. The left lung appears clear. There is no visible pneumothorax. There is no acute osseous abnormality. IMPRESSION: Opacification  throughout the right hemithorax, worst in the right mid to lower lung, could represent pleural effusion with mid to lower lung airspace disease. Electronically Signed   By: Caprice Renshaw M.D.   On: 05/16/2021 13:21    EKG: Independently reviewed. Sinus tachycardia, no Acute ST T wave changes  Assessment/Plan Active Problems:  Severe sepsis (HCC)-poa Toxic encephalopathy -Meets criteria for severe sepsis based on fever, tachycardia, tachypnea, source-right-sided consolidation and worsening encephalopathy -secondary to aspiration pneumonia has extensive right-sided consolidation, granddaughter reports prior history of pneumonias, unclear of known dysphagia -Started on IV vancomycin and cefepime in the ED, will continue this today, family insists on treatment using antibiotics fluids, to assess response to therapy for first 24 to 48 hours, FU Blood Cx -  Depending on clinical progress, will need to determine next steps, anticipate need for additional palliative care discussions   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 -Follow-up urine cultures -IV cefepime as above -Discussed with granddaughter that patient is not a candidate for surgical drainage/intervention  Advanced dementia Nonverbal with behavioral disturbance Bed/wheelchair-bound -Followed by hospice, not on comfort measures -Discussed with granddaughter that he is not a candidate for PEG tube if he is noted to have severe dysphagia -add PRN Haldol  Recent COVID -Reportedly diagnosed with COVID first weekend in August, over 3 weeks prior -Pneumonia at this time is more consistent with aspiration and not COVID -I do not think he needs additional isolation   Diabetes Mellitus -Hold Lantus, sliding scale insulin for now  H/o CAD -not active at this time  DVT prophylaxis: lovenox Code Status: DNR Family Communication:d/w grand daughter [name]  Disposition Plan: to be determined Consults called: none Admission  status:inpatient  Zannie Cove MD [facility]   05/16/2021, 5:46 PM

## 2021-05-16 NOTE — ED Notes (Signed)
Other daughter at bedside. Pt asleep. NAD noted.

## 2021-05-17 ENCOUNTER — Encounter (HOSPITAL_COMMUNITY): Payer: Self-pay | Admitting: Internal Medicine

## 2021-05-17 LAB — COMPREHENSIVE METABOLIC PANEL
ALT: 23 U/L (ref 0–44)
AST: 24 U/L (ref 15–41)
Albumin: 2.2 g/dL — ABNORMAL LOW (ref 3.5–5.0)
Alkaline Phosphatase: 52 U/L (ref 38–126)
Anion gap: 7 (ref 5–15)
BUN: 32 mg/dL — ABNORMAL HIGH (ref 8–23)
CO2: 22 mmol/L (ref 22–32)
Calcium: 8.2 mg/dL — ABNORMAL LOW (ref 8.9–10.3)
Chloride: 113 mmol/L — ABNORMAL HIGH (ref 98–111)
Creatinine, Ser: 1.23 mg/dL (ref 0.61–1.24)
GFR, Estimated: >60 mL/min (ref >60)
Glucose, Bld: 166 mg/dL — ABNORMAL HIGH (ref 70–99)
Potassium: 3.7 mmol/L (ref 3.5–5.1)
Sodium: 142 mmol/L (ref 135–145)
Total Bilirubin: 1.4 mg/dL — ABNORMAL HIGH (ref 0.3–1.2)
Total Protein: 5.9 g/dL — ABNORMAL LOW (ref 6.5–8.1)

## 2021-05-17 LAB — CBC
HCT: 35.5 % — ABNORMAL LOW (ref 39.0–52.0)
Hemoglobin: 11.4 g/dL — ABNORMAL LOW (ref 13.0–17.0)
MCH: 28.4 pg (ref 26.0–34.0)
MCHC: 32.1 g/dL (ref 30.0–36.0)
MCV: 88.3 fL (ref 80.0–100.0)
Platelets: 153 10*3/uL (ref 150–400)
RBC: 4.02 MIL/uL — ABNORMAL LOW (ref 4.22–5.81)
RDW: 14.6 % (ref 11.5–15.5)
WBC: 6.5 10*3/uL (ref 4.0–10.5)
nRBC: 0 % (ref 0.0–0.2)

## 2021-05-17 LAB — MAGNESIUM: Magnesium: 2.1 mg/dL (ref 1.7–2.4)

## 2021-05-17 LAB — BRAIN NATRIURETIC PEPTIDE: B Natriuretic Peptide: 108.6 pg/mL — ABNORMAL HIGH (ref 0.0–100.0)

## 2021-05-17 LAB — GLUCOSE, CAPILLARY
Glucose-Capillary: 119 mg/dL — ABNORMAL HIGH (ref 70–99)
Glucose-Capillary: 150 mg/dL — ABNORMAL HIGH (ref 70–99)
Glucose-Capillary: 155 mg/dL — ABNORMAL HIGH (ref 70–99)
Glucose-Capillary: 157 mg/dL — ABNORMAL HIGH (ref 70–99)
Glucose-Capillary: 159 mg/dL — ABNORMAL HIGH (ref 70–99)
Glucose-Capillary: 159 mg/dL — ABNORMAL HIGH (ref 70–99)

## 2021-05-17 LAB — C-REACTIVE PROTEIN: CRP: 32.5 mg/dL — ABNORMAL HIGH (ref <1.0)

## 2021-05-17 LAB — PROCALCITONIN: Procalcitonin: 2.64 ng/mL

## 2021-05-17 LAB — MRSA NEXT GEN BY PCR, NASAL: MRSA by PCR Next Gen: DETECTED — AB

## 2021-05-17 MED ORDER — CHLORHEXIDINE GLUCONATE CLOTH 2 % EX PADS
6.0000 | MEDICATED_PAD | Freq: Every day | CUTANEOUS | Status: DC
Start: 2021-05-18 — End: 2021-05-21
  Administered 2021-05-18 – 2021-05-21 (×4): 6 via TOPICAL

## 2021-05-17 MED ORDER — MUPIROCIN 2 % EX OINT
1.0000 | TOPICAL_OINTMENT | Freq: Two times a day (BID) | CUTANEOUS | Status: DC
Start: 2021-05-17 — End: 2021-05-21
  Administered 2021-05-17 – 2021-05-21 (×8): 1 via NASAL
  Filled 2021-05-17: qty 22
  Filled 2021-05-17: qty 44

## 2021-05-17 MED ORDER — MORPHINE SULFATE (PF) 2 MG/ML IV SOLN
2.0000 mg | INTRAVENOUS | Status: DC | PRN
  Administered 2021-05-17 – 2021-05-19 (×3): 2 mg via INTRAVENOUS
  Filled 2021-05-17 (×3): qty 1

## 2021-05-17 NOTE — Progress Notes (Signed)
PROGRESS NOTE                                                                                                                                                                                                             Patient Demographics:    Dean Harris, is a 80 y.o. male, DOB - 10/31/1940, TIW:[id number]  Outpatient Primary MD for the patient is Bennie Pierini, FNP    LOS - 1  Admit date - 05/16/2021    Chief Complaint  Patient presents with   Altered Mental Status   Fever       Brief Narrative (HPI from H&P)   Dean Harris is a 80 year old male from [facility] with advanced dementia, nonverbal with behavioral disturbance, bedbound/wheelchair-bound, history of diabetes, hypertension, history of CAD, followed by [facility] for the last 4 to 5 months, was reportedly diagnosed with COVID 3 weeks ago, he had a mild cough but otherwise was relatively asymptomatic then, over the last couple of days had worsening cough, he subsequently became more was brought hospital where he was diagnosed with sepsis due to pneumonia right kidney abscess and sepsis.   Subjective:    Dean Harris today is in bed -appears in nom distress, somnolent, unable to answer questions.   Assessment  & Plan :   Severe sepsis (HCC)- POA - due to RLL PNA + possible R. Kidney Abscess - with Toxic Encephalopathy - he was status Hospice due to advanced dementia and poor quality of life at the ALF, sepsis pathophysiology is better, continue empiric IV ABX, not a surgical candidate, Pall care to see, long term prognosis is very poor. Likely as underlying chronic aspiration, will have SLP evaluate him as well. Follow cultures.   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   Advanced dementia - Nonverbal with behavioral disturbance - Bed/wheelchair-bound -Followed  by hospice, Pall. Care called, ? GOC, Discussed with granddaughter that he is not a candidate for PEG tube if he is noted to have severe dysphagia, on PRN Haldol   Recent COVID  - Reportedly diagnosed with COVID first weekend in August, over 3 weeks prior, no acute issues.   H/o CAD - not active at this time   Diabetes Mellitus -Hold Lantus, sliding scale insulin for now  CBG (last 3)  Recent Labs    05/16/21 2304 05/17/21 0510 05/17/21 0827  GLUCAP 161* 159* 155*           Condition - Extremely Guarded  Family Communication  : Granddaughter at bedside  Code Status : DNR  Consults  : Palliative care  PUD Prophylaxis :    Procedures  :     CT - 1. Multi lobar pneumonia involving the right upper and right lower lobes. The right lower lobe is essentially completely consolidated. 2. New wall thickening and gas within the known large right upper pole renal cysts concerning for superinfection and abscess formation. This may be secondary complication in the setting of multilobar pneumonia and bacteremia. 3. No evidence of acute pulmonary embolus. 4. Aortic and coronary artery atherosclerotic calcifications. 5. Sequelae of old granulomatous disease with bilateral calcified hilar lymph nodes and punctate calcifications throughout the spleen. Aortic Atherosclerosis      Disposition Plan  :    Status is: Inpatient  Remains inpatient appropriate because:IV treatments appropriate due to intensity of illness or inability to take PO  Dispo: The patient is from: SNF              Anticipated d/c is to: SNF              Patient currently is not medically stable to d/c.   Difficult to place patient No  DVT Prophylaxis  :    enoxaparin (LOVENOX) injection 40 mg Start: 05/16/21 2000    Lab Results  Component Value Date   PLT 162 05/16/2021    Diet :  Diet Order             Diet NPO time specified Except for: Sips with Meds  Diet effective now                     Inpatient Medications  Scheduled Meds:  enoxaparin (LOVENOX) injection  40 mg Subcutaneous Q24H   insulin aspart  0-9 Units Subcutaneous Q4H   memantine  10 mg Oral BID   Continuous Infusions:  ceFEPime (MAXIPIME) IV 2 g (05/17/21 0927)   lactated ringers 75 mL/hr at 05/16/21 2025   vancomycin 500 mg (05/17/21 0520)   PRN Meds:.acetaminophen **OR** acetaminophen, haloperidol lactate, morphine injection, ondansetron **OR** ondansetron (ZOFRAN) IV  Antibiotics  :    Anti-infectives (From admission, onward)    Start     Dose/Rate Route Frequency Ordered Stop   05/17/21 0400  vancomycin (VANCOREADY) IVPB 500 mg/100 mL        500 mg 100 mL/hr over 60 Minutes Intravenous Every 12 hours 05/16/21 1456 05/24/21 0359   05/16/21 2200  ceFEPIme (MAXIPIME) 2 g in sodium chloride 0.9 % 100 mL IVPB        2 g 200 mL/hr over 30 Minutes Intravenous Every 12 hours 05/16/21 1456 05/23/21 2159   05/16/21 1300  vancomycin (VANCOREADY) IVPB 1250 mg/250 mL        1,250 mg 166.7 mL/hr over 90 Minutes Intravenous  Once 05/16/21 1222 05/16/21 1640   05/16/21 1230  ceFEPIme (MAXIPIME) 2 g in sodium chloride 0.9 % 100 mL IVPB        2 g 200 mL/hr over 30 Minutes Intravenous  Once 05/16/21 1222 05/16/21 1432   05/16/21 1230  metroNIDAZOLE (FLAGYL) IVPB 500 mg        500 mg 100 mL/hr over 60 Minutes Intravenous  Once 05/16/21 1222 05/16/21 1445  Time Spent in minutes  30   Susa RaringPrashant Hila Bolding M.D on 05/17/2021 at 10:23 AM  To page go to [email or web address]   [facility] -  Office  [phone]  See all Orders from today for further details    Objective:   Vitals:   05/17/21 0254 05/17/21 0456 05/17/21 0554 05/17/21 0825  BP: 130/63 (!) 138/91  (!) 131/56  Pulse: 79 (!) 103  97  Resp: 20 20  20   Temp: 99.7 F (37.6 C) 98.6 F (37 C)  99.2 F (37.3 C)  TempSrc: Axillary Axillary  Oral  SpO2: 92% 93%  93%  Weight: 67.3 kg  65.3 kg     Wt Readings from Last 3 Encounters:   05/17/21 65.3 kg  07/15/20 66.7 kg  05/10/20 66.7 kg     Intake/Output Summary (Last 24 hours) at 05/17/2021 1023 Last data filed at 05/16/2021 2149 Gross per 24 hour  Intake 2372.43 ml  Output --  Net 2372.43 ml     Physical Exam  Somnolent but arousable, unable to answer questions or follow commands, No apparent focal deficits [place].AT,PERRAL Supple Neck,No JVD, No cervical lymphadenopathy appriciated.  Symmetrical Chest wall movement, Good air movement bilaterally, CTAB RRR,No Gallops,Rubs or new Murmurs, No Parasternal Heave +ve B.Sounds, Abd Soft, No tenderness, No organomegaly appriciated, No rebound - guarding or rigidity. No Cyanosis, Clubbing or edema, No new Rash or bruise     RN pressure injury documentation: Pressure Injury 08/01/20 Coccyx Medial Stage 1 -  Intact skin with non-blanchable redness of a localized area usually over a bony prominence. (Active)  08/01/20 1029  Location: Coccyx  Location Orientation: Medial  Staging: Stage 1 -  Intact skin with non-blanchable redness of a localized area usually over a bony prominence.  Wound Description (Comments):   Present on Admission: Yes     Pressure Injury 05/17/21 Heel Left;Medial Unstageable - Full thickness tissue loss in which the base of the injury is covered by slough (yellow, tan, gray, green or brown) and/or eschar (tan, brown or black) in the wound bed. (Active)  05/17/21 0300  Location: Heel  Location Orientation: Left;Medial  Staging: Unstageable - Full thickness tissue loss in which the base of the injury is covered by slough (yellow, tan, gray, green or brown) and/or eschar (tan, brown or black) in the wound bed.  Wound Description (Comments):   Present on Admission: Yes     Data Review:    CBC Recent Labs  Lab 05/16/21 1323 05/16/21 1706 05/16/21 1955  WBC 8.7  --  8.5  HGB 12.6* 11.6* 11.4*  HCT 39.2 34.0* 36.2*  PLT 164  --  162  MCV 88.7  --  90.5  MCH 28.5  --  28.5  MCHC 32.1  --   31.5  RDW 14.5  --  14.6  LYMPHSABS 0.8  --   --   MONOABS 0.6  --   --   EOSABS 0.0  --   --   BASOSABS 0.0  --   --     Recent Labs  Lab 05/16/21 1222 05/16/21 1323 05/16/21 1706 05/16/21 1955  NA  --  143 144  --   K  --  3.9 4.1  --   CL  --  111  --   --   CO2  --  23  --   --   GLUCOSE  --  267*  --   --   BUN  --  30*  --   --  CREATININE  --  1.40*  --  1.24  CALCIUM  --  8.2*  --   --   AST  --  29  --   --   ALT  --  22  --   --   ALKPHOS  --  60  --   --   BILITOT  --  1.9*  --   --   ALBUMIN  --  2.4*  --   --   LATICACIDVEN 1.3  --   --   --   INR  --  1.4*  --   --   HGBA1C  --   --   --  8.0*    ------------------------------------------------------------------------------------------------------------------ No results for input(s): CHOL, HDL, LDLCALC, TRIG, CHOLHDL, LDLDIRECT in the last 72 hours.  Lab Results  Component Value Date   HGBA1C 8.0 (H) 05/16/2021   ------------------------------------------------------------------------------------------------------------------ No results for input(s): TSH, T4TOTAL, T3FREE, THYROIDAB in the last 72 hours.  Invalid input(s): FREET3  Cardiac Enzymes No results for input(s): CKMB, TROPONINI, MYOGLOBIN in the last 168 hours.  Invalid input(s): CK ------------------------------------------------------------------------------------------------------------------ No results found for: BNP   Radiology Reports CT HEAD WO CONTRAST ( )  Result Date: 05/16/2021 CLINICAL DATA:  Delirium EXAM: CT HEAD WITHOUT CONTRAST TECHNIQUE: Contiguous axial images were obtained from the base of the skull through the vertex without intravenous contrast. COMPARISON:  CT head 01/22/2021, brain MRI 01/11/2020 FINDINGS: Brain: There is no evidence of acute intracranial hemorrhage, extra-axial fluid collection, or infarct. There is moderate to severe global parenchymal volume loss with ex vacuo dilatation of the ventricular  system, not significantly changed. Hypodensity in the subcortical and periventricular white matter is also not significantly changed, likely reflecting sequela of chronic white matter microangiopathy. There is no mass lesion. There is no midline shift. Vascular: There is calcification of the bilateral cavernous ICAs. Skull: Normal. Negative for fracture or focal lesion. Sinuses/Orbits: The paranasal sinuses are clear. The globes and orbits are unremarkable. Other: There is a left mastoid effusion, unchanged. IMPRESSION: 1. No acute intracranial pathology. 2. Unchanged global parenchymal volume loss and chronic white matter microangiopathy. 3. Unchanged left mastoid effusion. Electronically Signed   By: Lesia Hausen M.D.   On: 05/16/2021 15:04   CT Angio Chest PE W/Cm &/Or Wo Cm  Result Date: 05/16/2021 CLINICAL DATA:  PE suspected, high probability. EXAM: CT ANGIOGRAPHY CHEST WITH CONTRAST TECHNIQUE: Multidetector CT imaging of the chest was performed using the standard protocol during bolus administration of intravenous contrast. Multiplanar CT image reconstructions and MIPs were obtained to evaluate the vascular anatomy. CONTRAST:  65mL OMNIPAQUE IOHEXOL 350 MG/ML SOLN COMPARISON:  CT abdomen/pelvis 01/10/2020 FINDINGS: Cardiovascular: Satisfactory opacification of the pulmonary arteries to the segmental level. No evidence of pulmonary embolism. Normal heart size. No pericardial effusion. Atherosclerotic calcifications present throughout the thoracic aorta, branch arteries and coronary arteries. Calcifications also present involving the aortic valve. Mediastinum/Nodes: Calcified hilar lymph nodes. No mediastinal mass. Unremarkable thoracic esophagus. Lungs/Pleura: New gas within the previously identified large right upper pole renal cyst. Additionally, the margin appears slightly thickened. Calcified granuloma in the periphery of the lingula and within the medial left lower lobe. Extensive bronchial wall  thickening throughout the right lung. Upper Abdomen: New gas and wall thickening involving the known large right upper pole renal cyst. Punctate calcifications throughout the spleen consistent with sequelae of granulomatous disease. Musculoskeletal: No acute fracture or malalignment. 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 Review of the MIP images  confirms the above findings. IMPRESSION: 1. Multi lobar pneumonia involving the right upper and right lower lobes. The right lower lobe is essentially completely consolidated. 2. New wall thickening and gas within the known large right upper pole renal cysts concerning for superinfection and abscess formation. This may be secondary complication in the setting of multilobar pneumonia and bacteremia. 3. No evidence of acute pulmonary embolus. 4. Aortic and coronary artery atherosclerotic calcifications. 5. Sequelae of old granulomatous disease with bilateral calcified hilar lymph nodes and punctate calcifications throughout the spleen. Aortic Atherosclerosis (ICD10-I70.0). Electronically Signed   By: Malachy Moan M.D.   On: 05/16/2021 15:52   DG Chest Port 1 View  Result Date: 05/16/2021 CLINICAL DATA:  Questionable sepsis - evaluate for abnormality EXAM: PORTABLE CHEST 1 VIEW COMPARISON:  Radiograph 07/31/2020 FINDINGS: The patient is rotated towards the right. Unchanged cardiomediastinal silhouette. There is new opacification throughout the right lung, worst in the mid to lower lung. The left lung appears clear. There is no visible pneumothorax. There is no acute osseous abnormality. IMPRESSION: Opacification throughout the right hemithorax, worst in the right mid to lower lung, could represent pleural effusion with mid to lower lung airspace disease. Electronically Signed   By: Caprice Renshaw M.D.   On: 05/16/2021 13:21

## 2021-05-17 NOTE — Consult Note (Signed)
WOC Nurse Consult Note: Patient receiving care in [facility] [phone]. Assisted in completing this consult by information obtained via Secure Chat by primary RN, Tedra Coupe. Reason for Consult: unstageable wound to left heel Wound type: unstageable PI to left heel Pressure Injury POA: Yes Measurement: 3 cm x 3 cm Wound bed: black Drainage (amount, consistency, odor) no odor, no drainage. Periwound: intact Dressing procedure/placement/frequency: Apply iodine from the swabsticks or swab pads from clean utility.  Allow to air dry. Then place the foot in a Prevalon heel lift boot [place] [phone]).  Monitor the wound area(s) for worsening of condition such as: Signs/symptoms of infection,  Increase in size,  Development of or worsening of odor, Development of pain, or increased pain at the affected locations.  Notify the medical team if any of these develop.  Thank you for the consult.  Discussed plan of care with the bedside nurse.  WOC nurse will not follow at this time.  Please re-consult the WOC team if needed.  Helmut Muster, RN, MSN, CWOCN, CNS-BC, pager [phone]

## 2021-05-17 NOTE — Progress Notes (Signed)
PT Cancellation Note  Patient Details Name: Dean Harris MRN: [id number] DOB: 06/27/1941   Cancelled Treatment:    Reason Eval/Treat Not Completed: PT screened, no needs identified, will sign off Pt from SNF and active with hospice services. No skilled PT needs at this time. If needs change, please re-consult.   Farley Ly, PT, DPT  Acute Rehabilitation Services  Pager: [phone] Office: [phone]    Lehman Prom 05/17/2021, 1:23 PM

## 2021-05-17 NOTE — Progress Notes (Signed)
SLP Cancellation Note  Patient Details Name: Dean Harris MRN: [id number] DOB: 1941/04/01   Cancelled treatment:       Reason Eval/Treat Not Completed: Fatigue/lethargy limiting ability to participate (Pt was recently given morphine and RN reported that the pt is now inadequately alert to participate in the swallow eval. SLP will follow up on subsequent date unless contacted sooner.)  Wannetta Langland I. Vear Clock, MS, CCC-SLP Acute Rehabilitation Services Office number [phone] Pager [phone]  Scheryl Marten 05/17/2021, 11:21 AM

## 2021-05-17 NOTE — Progress Notes (Signed)
[place] [phone]  [facility]) Hospitalized Hospice Patient Note  Dean Harris is a current hospice patient with [facility], on service with a terminal diagnosis of Alzheimer's Dementia. In the days leading to admission pt less responsive at his facility. On 05/16/21 his family requested he be sent to the ED for evaluation. Pt was evaluated at [facility] Emergency Department and admitted with a diagnosis of severe sepsis.  Per Dr. Gildardo Cranker, [facility] MD, this is a related admission.   Visited at bedside. Met privately with pt's granddaughters, one of whom, Dean Harris, is HCPOA.  They shared recent events with pt's wife, their grandmother, dying within the past two weeks.  Hey share that they understand the very grave prognosis but want to treat with IV fluids and antibiotics.    This patient is appropriate for general inpatient status due to the need for IV antibiotics and IV PRN medications to ensure comfort.  Vital Signs: 98.3 axillary, 119/60 (72), 90 HR, 17 RR, 94% 3L [place]  I&O's:  2372.5/-  Abnormal Labs: BUN 32, Ca 8.2, albumin 2.2, total protein 5.9, total bili 1.4, CRP 32.5, hgb 11.4, MRSA +, Covid + (no longer on isolation)  Diagnostics:  CT HEAD WO CONTRAST (5MM)   Result Date: 05/16/2021 CLINICAL DATA:  Delirium EXAM: CT HEAD WITHOUT CONTRAST TECHNIQUE: Contiguous axial images were obtained from the base of the skull through the vertex without intravenous contrast. COMPARISON:  CT head 01/22/2021, brain MRI 01/11/2020 FINDINGS: Brain: There is no evidence of acute intracranial hemorrhage, extra-axial fluid collection, or infarct. There is moderate to severe global parenchymal volume loss with ex vacuo dilatation of the ventricular system, not significantly changed. Hypodensity in the subcortical and periventricular white matter is also not significantly changed, likely reflecting sequela of chronic white matter microangiopathy. There is no mass lesion. There is no midline shift. Vascular: There is  calcification of the bilateral cavernous ICAs. Skull: Normal. Negative for fracture or focal lesion. Sinuses/Orbits: The paranasal sinuses are clear. The globes and orbits are unremarkable. Other: There is a left mastoid effusion, unchanged. IMPRESSION: 1. No acute intracranial pathology. 2. Unchanged global parenchymal volume loss and chronic white matter microangiopathy. 3. Unchanged left mastoid effusion. Electronically Signed   By: Valetta Mole M.D.   On: 05/16/2021 15:04    CT Angio Chest PE W/Cm &/Or Wo Cm   Result Date: 05/16/2021 CLINICAL DATA:  PE suspected, high probability. EXAM: CT ANGIOGRAPHY CHEST WITH CONTRAST TECHNIQUE: Multidetector CT imaging of the chest was performed using the standard protocol during bolus administration of intravenous contrast. Multiplanar CT image reconstructions and MIPs were obtained to evaluate the vascular anatomy. CONTRAST:  20mL OMNIPAQUE IOHEXOL 350 MG/ML SOLN COMPARISON:  CT abdomen/pelvis 01/10/2020 FINDINGS: Cardiovascular: Satisfactory opacification of the pulmonary arteries to the segmental level. No evidence of pulmonary embolism. Normal heart size. No pericardial effusion. Atherosclerotic calcifications present throughout the thoracic aorta, branch arteries and coronary arteries. Calcifications also present involving the aortic valve. Mediastinum/Nodes: Calcified hilar lymph nodes. No mediastinal mass. Unremarkable thoracic esophagus. Lungs/Pleura: New gas within the previously identified large right upper pole renal cyst. Additionally, the margin appears slightly thickened. Calcified granuloma in the periphery of the lingula and within the medial left lower lobe. Extensive bronchial wall thickening throughout the right lung. Upper Abdomen: New gas and wall thickening involving the known large right upper pole renal cyst. Punctate calcifications throughout the spleen consistent with sequelae of granulomatous disease. Musculoskeletal: No acute fracture or  malalignment. Dystrophic idiopathic skeletal hyperostosis with large  bridging osteophytes along the anterior spine. Review of the MIP images confirms the above findings. IMPRESSION: 1. Multi lobar pneumonia involving the right upper and right lower lobes. The right lower lobe is essentially completely consolidated. 2. New wall thickening and gas within the known large right upper pole renal cysts concerning for superinfection and abscess formation. This may be secondary complication in the setting of multilobar pneumonia and bacteremia. 3. No evidence of acute pulmonary embolus. 4. Aortic and coronary artery atherosclerotic calcifications. 5. Sequelae of old granulomatous disease with bilateral calcified hilar lymph nodes and punctate calcifications throughout the spleen. Aortic Atherosclerosis (ICD10-I70.0). Electronically Signed   By: Jacqulynn Cadet M.D.   On: 05/16/2021 15:52    DG Chest Port 1 View   Result Date: 05/16/2021 CLINICAL DATA:  Questionable sepsis - evaluate for abnormality EXAM: PORTABLE CHEST 1 VIEW COMPARISON:  Radiograph 07/31/2020 FINDINGS: The patient is rotated towards the right. Unchanged cardiomediastinal silhouette. There is new opacification throughout the right lung, worst in the mid to lower lung. The left lung appears clear. There is no visible pneumothorax. There is no acute osseous abnormality. IMPRESSION: Opacification throughout the right hemithorax, worst in the right mid to lower lung, could represent pleural effusion with mid to lower lung airspace disease. Electronically Signed   By: Maurine Simmering M.D.   On: 05/16/2021 13:21     Note Details  Author Thurnell Lose, MD File Time 05/17/2021 10:45 AM Author Type Physician Status Signed Last Editor Thurnell Lose, MD [facility] Acct # [id number] Admit Date 05/16/2021   IV/PRN Meds:  LR 1L bolus x 1; flagyl $RemoveB'500mg'[id number]$  PIV once; vancomycin 1250 mg once noe 500 mg PIV BID; cefepime2g PIV BID; morphine  $RemoveB'2mg'XniAIVQe$  PIV 24h PRN severe pain x 1 dose in 24/hr  Problem List: Severe sepsis ([place])- POA - due to RLL PNA + possible R. Kidney Abscess - with Toxic Encephalopathy - he was status Hospice due to advanced dementia and poor quality of life at the ALF, sepsis pathophysiology is better, continue empiric IV ABX, not a surgical candidate, Pall care to see, long term prognosis is very poor. Likely as underlying chronic aspiration, will have SLP evaluate him as well. Follow cultures.   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   Advanced dementia - Nonverbal with behavioral disturbance - Bed/wheelchair-bound -Followed by hospice, Pall. Care called, ? GOC, Discussed with granddaughter that he is not a candidate for PEG tube if he is noted to have severe dysphagia, on PRN Haldol   Recent COVID  - Reportedly diagnosed with COVID first weekend in August, over 3 weeks prior, no acute issues.  Discharge Planning: ongoing  Family Contact: Visited with Roselie Skinner and other granddaughter in waiting room, spoke with pt's brother at bedside.  IDT: Updated  Goals of Care: DNR, do not want any procedures done or escalation of care but do want to "treat the treatable."  Should pt require ambulance transport at time of discharge please use [facility] as [facility] contracts thi service for our active hospice pts.  Thank you for the opportunity to participate in this patient's care.     Domenic Moras, BSN, RN [facility] Liaison [phone] (24h on call)

## 2021-05-17 NOTE — ED Notes (Signed)
Pt continues to sleep. NAD noted. 

## 2021-05-17 NOTE — ED Notes (Signed)
I changed pt's brief to a new one. Partial bed change done.  I attempted to get an oral temperature and pt refused.

## 2021-05-17 NOTE — Progress Notes (Signed)
Unable to fully complete admission assessment due to patient's mental status and unable to verbalize. No family present at bedside.

## 2021-05-17 NOTE — Plan of Care (Signed)
Maintain normal glucose levels Remain free from falls

## 2021-05-17 NOTE — ED Notes (Signed)
I attempted to call report, but nobody answered.

## 2021-05-17 NOTE — ED Notes (Signed)
Attempted to call report. They said they will call me back. 

## 2021-05-17 NOTE — Plan of Care (Signed)
  Problem: Safety: Goal: Ability to remain free from injury will improve Outcome: Progressing   Problem: Metabolic: Goal: Ability to maintain appropriate glucose levels will improve Outcome: Progressing   Problem: Skin Integrity: Goal: Risk for impaired skin integrity will decrease Outcome: Not Progressing Note: Un-stageable wound to left inner heel.

## 2021-05-17 NOTE — ED Notes (Addendum)
Pt taken to floor via RN via stretcher via monitor.

## 2021-05-18 ENCOUNTER — Inpatient Hospital Stay (HOSPITAL_COMMUNITY)

## 2021-05-18 LAB — CBC WITH DIFFERENTIAL/PLATELET
Abs Immature Granulocytes: 0.04 10*3/uL (ref 0.00–0.07)
Basophils Absolute: 0 10*3/uL (ref 0.0–0.1)
Basophils Relative: 0 %
Eosinophils Absolute: 0 10*3/uL (ref 0.0–0.5)
Eosinophils Relative: 0 %
HCT: 34.6 % — ABNORMAL LOW (ref 39.0–52.0)
Hemoglobin: 11.2 g/dL — ABNORMAL LOW (ref 13.0–17.0)
Immature Granulocytes: 1 %
Lymphocytes Relative: 14 %
Lymphs Abs: 1 10*3/uL (ref 0.7–4.0)
MCH: 28.7 pg (ref 26.0–34.0)
MCHC: 32.4 g/dL (ref 30.0–36.0)
MCV: 88.7 fL (ref 80.0–100.0)
Monocytes Absolute: 0.5 10*3/uL (ref 0.1–1.0)
Monocytes Relative: 7 %
Neutro Abs: 5.4 10*3/uL (ref 1.7–7.7)
Neutrophils Relative %: 78 %
Platelets: 145 10*3/uL — ABNORMAL LOW (ref 150–400)
RBC: 3.9 MIL/uL — ABNORMAL LOW (ref 4.22–5.81)
RDW: 14.6 % (ref 11.5–15.5)
WBC: 6.9 10*3/uL (ref 4.0–10.5)
nRBC: 0 % (ref 0.0–0.2)

## 2021-05-18 LAB — COMPREHENSIVE METABOLIC PANEL
ALT: 22 U/L (ref 0–44)
AST: 24 U/L (ref 15–41)
Albumin: 2 g/dL — ABNORMAL LOW (ref 3.5–5.0)
Alkaline Phosphatase: 46 U/L (ref 38–126)
Anion gap: 10 (ref 5–15)
BUN: 32 mg/dL — ABNORMAL HIGH (ref 8–23)
CO2: 21 mmol/L — ABNORMAL LOW (ref 22–32)
Calcium: 8.1 mg/dL — ABNORMAL LOW (ref 8.9–10.3)
Chloride: 114 mmol/L — ABNORMAL HIGH (ref 98–111)
Creatinine, Ser: 1.36 mg/dL — ABNORMAL HIGH (ref 0.61–1.24)
GFR, Estimated: 54 mL/min — ABNORMAL LOW (ref >60)
Glucose, Bld: 159 mg/dL — ABNORMAL HIGH (ref 70–99)
Potassium: 3.5 mmol/L (ref 3.5–5.1)
Sodium: 145 mmol/L (ref 135–145)
Total Bilirubin: 1.2 mg/dL (ref 0.3–1.2)
Total Protein: 5.4 g/dL — ABNORMAL LOW (ref 6.5–8.1)

## 2021-05-18 LAB — GLUCOSE, CAPILLARY
Glucose-Capillary: 163 mg/dL — ABNORMAL HIGH (ref 70–99)
Glucose-Capillary: 159 mg/dL — ABNORMAL HIGH (ref 70–99)
Glucose-Capillary: 182 mg/dL — ABNORMAL HIGH (ref 70–99)
Glucose-Capillary: 167 mg/dL — ABNORMAL HIGH (ref 70–99)
Glucose-Capillary: 165 mg/dL — ABNORMAL HIGH (ref 70–99)

## 2021-05-18 LAB — PROCALCITONIN: Procalcitonin: 2.39 ng/mL

## 2021-05-18 LAB — BRAIN NATRIURETIC PEPTIDE: B Natriuretic Peptide: 98.7 pg/mL (ref 0.0–100.0)

## 2021-05-18 LAB — MAGNESIUM: Magnesium: 2.1 mg/dL (ref 1.7–2.4)

## 2021-05-18 LAB — C-REACTIVE PROTEIN: CRP: 28.1 mg/dL — ABNORMAL HIGH (ref <1.0)

## 2021-05-18 MED ORDER — LACTATED RINGERS IV SOLN: INTRAVENOUS | Status: DC

## 2021-05-18 NOTE — Progress Notes (Signed)
Patient ID: Dean Harris, male   DOB: 04-20-1941, 80 y.o.   MRN: [id number]       PMT was notified by [facility] liaison Wallis Bamberg RN that family specifically stated to her today that they do not want to speak with anyone from Palliative Medicine.  PMT will sign off at this time. Please re-consult if needed.     Sherlean Foot, NP-C Palliative Medicine   Please call Palliative Medicine team phone with any questions [phone]. For individual providers please see AMION.   No charge

## 2021-05-18 NOTE — Progress Notes (Signed)
PROGRESS NOTE                                                                                                                                                                                                             Patient Demographics:    Dean Harris, is a 80 y.o. male, DOB - May 11, 1941, ZOX:[id number]  Outpatient Primary MD for the patient is Bennie PieriniMartin, Mary-Margaret, FNP    LOS - 2  Admit date - 05/16/2021    Chief Complaint  Patient presents with   Altered Mental Status   Fever       Brief Narrative (HPI from H&P)   Dean Harris is a 80 year old male from [facility] with advanced dementia, nonverbal with behavioral disturbance, bedbound/wheelchair-bound, history of diabetes, hypertension, history of CAD, [facility] for the last 4 to 5 months, was reportedly diagnosed with COVID 3 weeks ago, he had a mild cough but otherwise was relatively asymptomatic then, over the last couple of days had worsening cough, he subsequently became more was brought hospital where he was diagnosed with sepsis due to pneumonia right kidney abscess and sepsis.   Subjective:    Dean Harris today is in bed -appears in nom distress, somnolent, unable to answer questions.   Assessment  & Plan :   Severe sepsis (HCC)- POA - due to RLL PNA + possible R. Kidney Abscess - with Toxic Encephalopathy - he was status Hospice due to advanced dementia and poor quality of life at the ALF, sepsis pathophysiology is better, continue empiric IV ABX, not a surgical candidate, await palliative care to see to establish long-term goals of care, long term prognosis is very poor. Likely as underlying chronic aspiration, will have SLP evaluate him as well. Follow cultures.   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  Will check renal ultrasound.   Advanced  dementia - Nonverbal with behavioral disturbance - Bed/wheelchair-bound -Followed by hospice, Pall. Care called, ? GOC, Discussed with granddaughter that he is not a candidate for PEG tube if he is noted to have severe dysphagia, on PRN Haldol   Recent COVID  - Reportedly diagnosed with COVID first weekend in August, over 3 weeks prior, no acute issues.   H/o CAD - not active at this time  Diabetes Mellitus -Hold Lantus, sliding scale insulin for now   CBG (last 3)  Recent Labs    05/17/21 2342 05/18/21 0357 05/18/21 0736  GLUCAP 150* 163* 159*           Condition - Extremely Guarded  Family Communication  : Granddaughter at bedside  Code Status : DNR  Consults  : Palliative care  PUD Prophylaxis :    Procedures  :     CT - 1. Multi lobar pneumonia involving the right upper and right lower lobes. The right lower lobe is essentially completely consolidated. 2. New wall thickening and gas within the known large right upper pole renal cysts concerning for superinfection and abscess formation. This may be secondary complication in the setting of multilobar pneumonia and bacteremia. 3. No evidence of acute pulmonary embolus. 4. Aortic and coronary artery atherosclerotic calcifications. 5. Sequelae of old granulomatous disease with bilateral calcified hilar lymph nodes and punctate calcifications throughout the spleen. Aortic Atherosclerosis      Disposition Plan  :    Status is: Inpatient  Remains inpatient appropriate because:IV treatments appropriate due to intensity of illness or inability to take PO  Dispo: The patient is from: SNF              Anticipated d/c is to: SNF              Patient currently is not medically stable to d/c.   Difficult to place patient No  DVT Prophylaxis  :    enoxaparin (LOVENOX) injection 40 mg Start: 05/16/21 2000    Lab Results  Component Value Date   PLT 145 (L) 05/18/2021    Diet :  Diet Order             Diet NPO time  specified Except for: Sips with Meds  Diet effective now                    Inpatient Medications  Scheduled Meds:  Chlorhexidine Gluconate Cloth  6 each Topical Q0600   enoxaparin (LOVENOX) injection  40 mg Subcutaneous Q24H   insulin aspart  0-9 Units Subcutaneous Q4H   memantine  10 mg Oral BID   mupirocin ointment  1 application Nasal BID   Continuous Infusions:  ceFEPime (MAXIPIME) IV 2 g (05/18/21 0905)   vancomycin 500 mg (05/18/21 0402)   PRN Meds:.acetaminophen **OR** acetaminophen, haloperidol lactate, morphine injection, ondansetron **OR** ondansetron (ZOFRAN) IV  Antibiotics  :    Anti-infectives (From admission, onward)    Start     Dose/Rate Route Frequency Ordered Stop   05/17/21 0400  vancomycin (VANCOREADY) IVPB 500 mg/100 mL        500 mg 100 mL/hr over 60 Minutes Intravenous Every 12 hours 05/16/21 1456 05/24/21 0359   05/16/21 2200  ceFEPIme (MAXIPIME) 2 g in sodium chloride 0.9 % 100 mL IVPB        2 g 200 mL/hr over 30 Minutes Intravenous Every 12 hours 05/16/21 1456 05/23/21 2159   05/16/21 1300  vancomycin (VANCOREADY) IVPB 1250 mg/250 mL        1,250 mg 166.7 mL/hr over 90 Minutes Intravenous  Once 05/16/21 1222 05/16/21 1640   05/16/21 1230  ceFEPIme (MAXIPIME) 2 g in sodium chloride 0.9 % 100 mL IVPB        2 g 200 mL/hr over 30 Minutes Intravenous  Once 05/16/21 1222 05/16/21 1432   05/16/21 1230  metroNIDAZOLE (FLAGYL) IVPB 500 mg  500 mg 100 mL/hr over 60 Minutes Intravenous  Once 05/16/21 1222 05/16/21 1445        Time Spent in minutes  30   Susa Raring M.D on 05/18/2021 at 10:04 AM  To page go to [email or web address]   [facility] -  Office  [phone]  See all Orders from today for further details    Objective:   Vitals:   05/17/21 1931 05/17/21 2338 05/18/21 0400 05/18/21 0733  BP: 119/60 117/61 (!) 143/71 121/78  Pulse: 89 100 97 100  Resp: 17 18 17 19   Temp: 98.3 F (36.8 C) 98 F (36.7 C) 98.3 F (36.8  C) 98.2 F (36.8 C)  TempSrc: Axillary Axillary Oral Oral  SpO2: 94% 91% 93% 90%  Weight:        Wt Readings from Last 3 Encounters:  05/17/21 65.3 kg  07/15/20 66.7 kg  05/10/20 66.7 kg     Intake/Output Summary (Last 24 hours) at 05/18/2021 1004 Last data filed at 05/18/2021 0437 Gross per 24 hour  Intake 300 ml  Output 725 ml  Net -425 ml     Physical Exam  Somnolent but arousable, unable to answer questions or follow commands, No apparent focal deficits [place].AT,PERRAL Supple Neck,No JVD, No cervical lymphadenopathy appriciated.  Symmetrical Chest wall movement, Good air movement bilaterally, CTAB RRR,No Gallops, Rubs or new Murmurs, No Parasternal Heave +ve B.Sounds, Abd Soft, No tenderness, No organomegaly appriciated, No rebound - guarding or rigidity. No Cyanosis, Clubbing or edema, No new Rash or bruise   RN pressure injury documentation: Pressure Injury 08/01/20 Coccyx Medial Stage 1 -  Intact skin with non-blanchable redness of a localized area usually over a bony prominence. (Active)  08/01/20 1029  Location: Coccyx  Location Orientation: Medial  Staging: Stage 1 -  Intact skin with non-blanchable redness of a localized area usually over a bony prominence.  Wound Description (Comments):   Present on Admission: Yes     Pressure Injury 05/17/21 Heel Left;Medial Unstageable - Full thickness tissue loss in which the base of the injury is covered by slough (yellow, tan, gray, green or brown) and/or eschar (tan, brown or black) in the wound bed. (Active)  05/17/21 0300  Location: Heel  Location Orientation: Left;Medial  Staging: Unstageable - Full thickness tissue loss in which the base of the injury is covered by slough (yellow, tan, gray, green or brown) and/or eschar (tan, brown or black) in the wound bed.  Wound Description (Comments):   Present on Admission: Yes     Data Review:    CBC Recent Labs  Lab 05/16/21 1323 05/16/21 1706 05/16/21 1955  05/17/21 1136 05/18/21 0007  WBC 8.7  --  8.5 6.5 6.9  HGB 12.6* 11.6* 11.4* 11.4* 11.2*  HCT 39.2 34.0* 36.2* 35.5* 34.6*  PLT 164  --  162 153 145*  MCV 88.7  --  90.5 88.3 88.7  MCH 28.5  --  28.5 28.4 28.7  MCHC 32.1  --  31.5 32.1 32.4  RDW 14.5  --  14.6 14.6 14.6  LYMPHSABS 0.8  --   --   --  1.0  MONOABS 0.6  --   --   --  0.5  EOSABS 0.0  --   --   --  0.0  BASOSABS 0.0  --   --   --  0.0    Recent Labs  Lab 05/16/21 1222 05/16/21 1323 05/16/21 1706 05/16/21 1955 05/17/21 1136 05/18/21 0007  NA  --  143  144  --  142 145  K  --  3.9 4.1  --  3.7 3.5  CL  --  111  --   --  113* 114*  CO2  --  23  --   --  22 21*  GLUCOSE  --  267*  --   --  166* 159*  BUN  --  30*  --   --  32* 32*  CREATININE  --  1.40*  --  1.24 1.23 1.36*  CALCIUM  --  8.2*  --   --  8.2* 8.1*  AST  --  29  --   --  24 24  ALT  --  22  --   --  23 22  ALKPHOS  --  60  --   --  52 46  BILITOT  --  1.9*  --   --  1.4* 1.2  ALBUMIN  --  2.4*  --   --  2.2* 2.0*  MG  --   --   --   --  2.1 2.1  CRP  --   --   --   --  32.5* 28.1*  PROCALCITON  --   --   --   --  2.64 2.39  LATICACIDVEN 1.3  --   --   --   --   --   INR  --  1.4*  --   --   --   --   HGBA1C  --   --   --  8.0*  --   --   BNP  --   --   --   --  108.6* 98.7    ------------------------------------------------------------------------------------------------------------------ No results for input(s): CHOL, HDL, LDLCALC, TRIG, CHOLHDL, LDLDIRECT in the last 72 hours.  Lab Results  Component Value Date   HGBA1C 8.0 (H) 05/16/2021   ------------------------------------------------------------------------------------------------------------------ No results for input(s): TSH, T4TOTAL, T3FREE, THYROIDAB in the last 72 hours.  Invalid input(s): FREET3  Cardiac Enzymes No results for input(s): CKMB, TROPONINI, MYOGLOBIN in the last 168 hours.  Invalid input(s):  CK ------------------------------------------------------------------------------------------------------------------    Component Value Date/Time   BNP 98.7 05/18/2021 0007     Radiology Reports CT HEAD WO CONTRAST ( )  Result Date: 05/16/2021 CLINICAL DATA:  Delirium EXAM: CT HEAD WITHOUT CONTRAST TECHNIQUE: Contiguous axial images were obtained from the base of the skull through the vertex without intravenous contrast. COMPARISON:  CT head 01/22/2021, brain MRI 01/11/2020 FINDINGS: Brain: There is no evidence of acute intracranial hemorrhage, extra-axial fluid collection, or infarct. There is moderate to severe global parenchymal volume loss with ex vacuo dilatation of the ventricular system, not significantly changed. Hypodensity in the subcortical and periventricular white matter is also not significantly changed, likely reflecting sequela of chronic white matter microangiopathy. There is no mass lesion. There is no midline shift. Vascular: There is calcification of the bilateral cavernous ICAs. Skull: Normal. Negative for fracture or focal lesion. Sinuses/Orbits: The paranasal sinuses are clear. The globes and orbits are unremarkable. Other: There is a left mastoid effusion, unchanged. IMPRESSION: 1. No acute intracranial pathology. 2. Unchanged global parenchymal volume loss and chronic white matter microangiopathy. 3. Unchanged left mastoid effusion. Electronically Signed   By: Lesia Hausen M.D.   On: 05/16/2021 15:04   CT Angio Chest PE W/Cm &/Or Wo Cm  Result Date: 05/16/2021 CLINICAL DATA:  PE suspected, high probability. EXAM: CT ANGIOGRAPHY CHEST WITH CONTRAST TECHNIQUE: Multidetector CT imaging of the chest was performed using the  standard protocol during bolus administration of intravenous contrast. Multiplanar CT image reconstructions and MIPs were obtained to evaluate the vascular anatomy. CONTRAST:  37mL OMNIPAQUE IOHEXOL 350 MG/ML SOLN COMPARISON:  CT abdomen/pelvis 01/10/2020  FINDINGS: Cardiovascular: Satisfactory opacification of the pulmonary arteries to the segmental level. No evidence of pulmonary embolism. Normal heart size. No pericardial effusion. Atherosclerotic calcifications present throughout the thoracic aorta, branch arteries and coronary arteries. Calcifications also present involving the aortic valve. Mediastinum/Nodes: Calcified hilar lymph nodes. No mediastinal mass. Unremarkable thoracic esophagus. Lungs/Pleura: New gas within the previously identified large right upper pole renal cyst. Additionally, the margin appears slightly thickened. Calcified granuloma in the periphery of the lingula and within the medial left lower lobe. Extensive bronchial wall thickening throughout the right lung. Upper Abdomen: New gas and wall thickening involving the known large right upper pole renal cyst. Punctate calcifications throughout the spleen consistent with sequelae of granulomatous disease. Musculoskeletal: No acute fracture or malalignment. 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 Review of the MIP images confirms the above findings. IMPRESSION: 1. Multi lobar pneumonia involving the right upper and right lower lobes. The right lower lobe is essentially completely consolidated. 2. New wall thickening and gas within the known large right upper pole renal cysts concerning for superinfection and abscess formation. This may be secondary complication in the setting of multilobar pneumonia and bacteremia. 3. No evidence of acute pulmonary embolus. 4. Aortic and coronary artery atherosclerotic calcifications. 5. Sequelae of old granulomatous disease with bilateral calcified hilar lymph nodes and punctate calcifications throughout the spleen. Aortic Atherosclerosis (ICD10-I70.0). Electronically Signed   By: Dean Moan M.D.   On: 05/16/2021 15:52   DG Chest Port 1 View  Result Date: 05/16/2021 CLINICAL DATA:   Questionable sepsis - evaluate for abnormality EXAM: PORTABLE CHEST 1 VIEW COMPARISON:  Radiograph 07/31/2020 FINDINGS: The patient is rotated towards the right. Unchanged cardiomediastinal silhouette. There is new opacification throughout the right lung, worst in the mid to lower lung. The left lung appears clear. There is no visible pneumothorax. There is no acute osseous abnormality. IMPRESSION: Opacification throughout the right hemithorax, worst in the right mid to lower lung, could represent pleural effusion with mid to lower lung airspace disease. Electronically Signed   By: Caprice Renshaw M.D.   On: 05/16/2021 13:21

## 2021-05-18 NOTE — Evaluation (Signed)
Clinical/Bedside Swallow Evaluation Patient Details  Name: Dean Harris MRN: [id number] Date of Birth: September 07, 1941  Today's Date: 05/18/2021 Time: SLP Start Time (ACUTE ONLY): 1116 SLP Stop Time (ACUTE ONLY): 1140 SLP Time Calculation (min) (ACUTE ONLY): 24 min  Past Medical History:  Past Medical History:  Diagnosis Date   Chronic stable angina (HCC)    Coronary artery disease, non-occlusive 01/2012   Diffuse percent LAD, OM1 and mid RCA 50% lesions   Depression    Diabetes mellitus    Essential hypertension    Glaucoma    Grade I diastolic dysfunction    Hyperlipidemia with target LDL less than 70    Memory difficulties    Moderate calcific aortic stenosis 01/2012   01/2012: Echo - mean/peak gradient12/21 mmHg; 01/2016: Moderate aortic stenosis (mean/peak gradient 24/35 mmHg)    Pneumonia    x2   PTSD (post-traumatic stress disorder) - with depression symptoms    [facility] This has helped his memory issues.   Thrombocytopenia (HCC) 04/29/2014   Vitamin D deficiency    Past Surgical History:  Past Surgical History:  Procedure Laterality Date   CAROTID ENDARTERECTOMY     LEFT HEART CATHETERIZATION WITH CORONARY ANGIOGRAM N/A 02/13/2012   Procedure: LEFT HEART CATHETERIZATION WITH CORONARY ANGIOGRAM;  Surgeon: Donato Schultz, MD;  [facility] C[facility] LAB;  Service: Cardiovascular: Diffuse mid LAD 50%, 50%pOM1, 50%mRCA -> medical management   TRANSTHORACIC ECHOCARDIOGRAM  01/2012   EF 55-60%. Mild LVH. No RWMA, mild Aortic Stenosis (mean/peak gradient 13 mmHg/21 mmHg)   TRANSTHORACIC ECHOCARDIOGRAM  May 2017, May 2019   A) EF 60-65% w/o RWMA. Normal DF for age. Mod AD (mean/peak gradient 24/35 mmHg); b) EF 60-65%, mod LVH, Mod AS (Mean gradient (S): 28 mm Hg. Peak 43 mmHg   US CAROTID DOPPLER BILATERAL ([facility] HX) Bilateral 02/05/2016   Stable mild-to-moderate disease with bilateral heterogeneous plaque. RICA - 1-39%, LICA 40-59%. Bilateral subclavian  and vertebral arteries normal.   HPI:  Pt is a 80 year old male from [facility] who presented to the ED  secondary to worsening cough, congestion and decreased responsiveness his family was notified. Pt was reportedly diagnosed with COVID 3 weeks ago; mild cough but otherwise was relatively asymptomatic then. CT chest on admission: Multi lobar pneumonia involving the right upper and right lower lobes. The right lower lobe is essentially completely consolidated.  New wall thickening and gas within the known large right upper pole renal cysts concerning for superinfection and abscess formation. Dx toxic encephalopathy. PMH: advanced dementia, nonverbal with behavioral disturbance, bedbound/wheelchair-bound, history of diabetes, hypertension, history of CAD, followed by [facility] for the last 4 to 5 months, but not on comfort measures. BSE 05/10/20: additional time needed for mastication and oral clearance, but no s/sx of aspiration noted; dysphagia 3/thin liquids.   Assessment / Plan / Recommendation Clinical Impression  Pt seen for bedside swallow evaluation with grandaughters present reporting improved alertness this date. They report that pt consumes a regular diet and demonstrates some oral holding at baseline. Pt very lethargic, but responded to verbal and tactile cues to increase attention to POs, however this was fleeting. Ice chips were poorly manipulated and suspect delay in swallow initation. Despite cueing and hand over hand assist, pt awareness of thin liquids via cup and straw were poor and he was unable to coordinate sucking/sipping. Small amounts of thin and puree via teaspoon were consumed with continued concern for airway compromise given prolonged  bolus holding and mild delayed throat clearing. Pt is nonverbal per grandaughters and assessment of vocal quality pre and post POs was difficult. Given clinical presentation this date, likely impacted by  exacerbation of baseline cognitive deficits and continued lethargy, recommend NPO with SLP to f/u for continued PO trials. Above discussed with family and RN who are in agreement.  SLP Visit Diagnosis: Dysphagia, unspecified (R13.10)    Aspiration Risk  Moderate aspiration risk    Diet Recommendation NPO   Medication Administration: Via alternative means    Other  Recommendations Oral Care Recommendations: Oral care QID;Staff/trained caregiver to provide oral care   Follow up Recommendations Other (comment) (TBD)      Frequency and Duration min 2x/week  2 weeks       Prognosis Prognosis for Safe Diet Advancement: Fair Barriers to Reach Goals: Cognitive deficits      Swallow Study   General Date of Onset: 05/16/21 HPI: Pt is a 80 year old male from [facility] who presented to the ED  secondary to worsening cough, congestion and decreased responsiveness his family was notified. Pt was reportedly diagnosed with COVID 3 weeks ago; mild cough but otherwise was relatively asymptomatic then. CT chest on admission: Multi lobar pneumonia involving the right upper and right lower lobes. The right lower lobe is essentially completely consolidated.  New wall thickening and gas within the known large right upper pole renal cysts concerning for superinfection and abscess formation. Dx toxic encephalopathy. PMH: advanced dementia, nonverbal with behavioral disturbance, bedbound/wheelchair-bound, history of diabetes, hypertension, history of CAD, followed by [facility] for the last 4 to 5 months, but not on comfort measures. BSE 05/10/20: additional time needed for mastication and oral clearance, but no s/sx of aspiration noted; dysphagia 3/thin liquids. Type of Study: Bedside Swallow Evaluation Previous Swallow Assessment: see HPI Diet Prior to this Study: NPO Temperature Spikes Noted: Yes (100.3) Respiratory Status: Nasal cannula History of Recent Intubation:  No Behavior/Cognition: Lethargic/Drowsy Oral Cavity Assessment: Other (comment) (unable to fully assess) Oral Care Completed by SLP: No Oral Cavity - Dentition: Other (Comment) (unable to fully assess) Self-Feeding Abilities: Total assist;Refused PO Patient Positioning: Upright in bed;Postural control adequate for testing Baseline Vocal Quality: Not observed    Oral/Motor/Sensory Function Overall Oral Motor/Sensory Function: Other (comment) (unable to fully assess)   Ice Chips Ice chips: Impaired Presentation: Spoon Oral Phase Impairments: Poor awareness of bolus;Reduced lingual movement/coordination Oral Phase Functional Implications: Oral holding;Prolonged oral transit Pharyngeal Phase Impairments: Multiple swallows   Thin Liquid Thin Liquid: Impaired Presentation: Cup;Spoon;Straw Oral Phase Impairments: Reduced labial seal;Poor awareness of bolus Oral Phase Functional Implications: Prolonged oral transit;Other (comment) (anterior spillage) Pharyngeal  Phase Impairments: Suspected delayed Swallow    Nectar Thick Nectar Thick Liquid: Not tested   Honey Thick Honey Thick Liquid: Not tested   Puree Puree: Impaired Presentation: Spoon Oral Phase Impairments: Poor awareness of bolus;Reduced lingual movement/coordination Oral Phase Functional Implications: Oral holding;Prolonged oral transit Pharyngeal Phase Impairments: Suspected delayed Swallow;Multiple swallows   Solid     Solid: Not tested     Avie Echevaria, MA, CCC-SLP Acute Rehabilitation Services Office Number: [phone] Weekend Pager: [phone]  Paulette Blanch 05/18/2021,12:06 PM

## 2021-05-18 NOTE — Progress Notes (Signed)
Dean Harris [id number]  [facility]) Hospitalized Hospice Patient   Dean Harris is a current hospice patient with a terminal diagnosis of Alzheimer's Dementia. In the days leading to admission, Dean Harris was notably less responsive at his facility. On 05/16/21 his family requested he be sent to the ED for evaluation. Pt was evaluated at [facility] Emergency Department and admitted with a diagnosis of severe sepsis. Per Dr. Kirt Boys, [name], this is a related admission.    Visited with patient and his granddaughters at the bedside. Speech therapy was present and was attempted to check his swallowing capacity, but he was too somnolent during her visit. Dean Harris appeared to be comfortable and well taken care of. He had been more interactive earlier in the day with his granddaughter present, and they were thankful for the chance to interact with him.  Dean Harris remains inpatient appropriate due to need of IV fluids and antibiotics.   V/S: 98.8 oral, 143/56, HR 83, RR 18, SPO2 92% on O2 @ 3 lpm I&O: 300/725 Labs: none Diagnostics: renal US planned later today  IV/PRNs: maxipeme 2 g IV BID, LR continuous @ 50 ml/hr, vancomycin 500 mg BID, morphine 2 mg IV x 1 within the last 24 hours  Problem List: - severe sepsis likely RLL PNA and kidney abscess - IV abx, speech therapy attempting to see but he was too somnolent during their visit - advanced dementia - under hospice services for this  GOC: Family wants to treat the treatable currently. They want to continue with hydration and ABX. D/C planning: ongoing Family: updated at bedside--they are well versed in his care  IDT: hospice team updated  Should pt need ambulance transport, please call  [facility] as they contract this service for our active hospice patients.  Thank you, Wallis Bamberg RN, BSN, CCRN [facility] Liaison

## 2021-05-18 NOTE — Plan of Care (Signed)
  Problem: Safety: Goal: Ability to remain free from injury will improve Outcome: Progressing   Problem: Skin Integrity: Goal: Risk for impaired skin integrity will decrease Outcome: Progressing   Problem: Metabolic: Goal: Ability to maintain appropriate glucose levels will improve Outcome: Progressing

## 2021-05-19 ENCOUNTER — Inpatient Hospital Stay (HOSPITAL_COMMUNITY)

## 2021-05-19 LAB — CBC WITH DIFFERENTIAL/PLATELET
Abs Immature Granulocytes: 0.12 10*3/uL — ABNORMAL HIGH (ref 0.00–0.07)
Basophils Absolute: 0 10*3/uL (ref 0.0–0.1)
Basophils Relative: 0 %
Eosinophils Absolute: 0 10*3/uL (ref 0.0–0.5)
Eosinophils Relative: 1 %
HCT: 36.9 % — ABNORMAL LOW (ref 39.0–52.0)
Hemoglobin: 11.8 g/dL — ABNORMAL LOW (ref 13.0–17.0)
Immature Granulocytes: 1 %
Lymphocytes Relative: 16 %
Lymphs Abs: 1.3 10*3/uL (ref 0.7–4.0)
MCH: 28.4 pg (ref 26.0–34.0)
MCHC: 32 g/dL (ref 30.0–36.0)
MCV: 88.7 fL (ref 80.0–100.0)
Monocytes Absolute: 0.7 10*3/uL (ref 0.1–1.0)
Monocytes Relative: 8 %
Neutro Abs: 6.3 10*3/uL (ref 1.7–7.7)
Neutrophils Relative %: 74 %
Platelets: 182 10*3/uL (ref 150–400)
RBC: 4.16 MIL/uL — ABNORMAL LOW (ref 4.22–5.81)
RDW: 14.6 % (ref 11.5–15.5)
WBC: 8.4 10*3/uL (ref 4.0–10.5)
nRBC: 0 % (ref 0.0–0.2)

## 2021-05-19 LAB — COMPREHENSIVE METABOLIC PANEL
ALT: 30 U/L (ref 0–44)
AST: 33 U/L (ref 15–41)
Albumin: 1.9 g/dL — ABNORMAL LOW (ref 3.5–5.0)
Alkaline Phosphatase: 49 U/L (ref 38–126)
Anion gap: 6 (ref 5–15)
BUN: 34 mg/dL — ABNORMAL HIGH (ref 8–23)
CO2: 23 mmol/L (ref 22–32)
Calcium: 8.3 mg/dL — ABNORMAL LOW (ref 8.9–10.3)
Chloride: 117 mmol/L — ABNORMAL HIGH (ref 98–111)
Creatinine, Ser: 1.28 mg/dL — ABNORMAL HIGH (ref 0.61–1.24)
GFR, Estimated: 58 mL/min — ABNORMAL LOW (ref >60)
Glucose, Bld: 171 mg/dL — ABNORMAL HIGH (ref 70–99)
Potassium: 3.7 mmol/L (ref 3.5–5.1)
Sodium: 146 mmol/L — ABNORMAL HIGH (ref 135–145)
Total Bilirubin: 0.9 mg/dL (ref 0.3–1.2)
Total Protein: 5.7 g/dL — ABNORMAL LOW (ref 6.5–8.1)

## 2021-05-19 LAB — GLUCOSE, CAPILLARY
Glucose-Capillary: 148 mg/dL — ABNORMAL HIGH (ref 70–99)
Glucose-Capillary: 173 mg/dL — ABNORMAL HIGH (ref 70–99)
Glucose-Capillary: 174 mg/dL — ABNORMAL HIGH (ref 70–99)
Glucose-Capillary: 226 mg/dL — ABNORMAL HIGH (ref 70–99)
Glucose-Capillary: 254 mg/dL — ABNORMAL HIGH (ref 70–99)
Glucose-Capillary: 260 mg/dL — ABNORMAL HIGH (ref 70–99)

## 2021-05-19 LAB — BRAIN NATRIURETIC PEPTIDE: B Natriuretic Peptide: 87.3 pg/mL (ref 0.0–100.0)

## 2021-05-19 LAB — URINE CULTURE: Culture: 10000 — AB

## 2021-05-19 LAB — VANCOMYCIN, PEAK: Vancomycin Pk: 24 ug/mL — ABNORMAL LOW (ref 30–40)

## 2021-05-19 LAB — MAGNESIUM: Magnesium: 2.3 mg/dL (ref 1.7–2.4)

## 2021-05-19 LAB — C-REACTIVE PROTEIN: CRP: 27.2 mg/dL — ABNORMAL HIGH (ref <1.0)

## 2021-05-19 LAB — PROCALCITONIN: Procalcitonin: 1.33 ng/mL

## 2021-05-19 MED ORDER — DEXTROSE 5 % IV SOLN: INTRAVENOUS | Status: AC

## 2021-05-19 NOTE — Progress Notes (Addendum)
Dean Harris [id number]  [facility]) Hospitalized Hospice Patient   Dean Harris is a current hospice patient with a terminal diagnosis of Alzheimer's Dementia. In the days leading to admission, Dean Harris was notably less responsive at his facility. On 05/16/21 his family requested he be sent to the ED for evaluation. Pt was evaluated at [facility] Emergency Department and admitted with a diagnosis of severe sepsis. Per Dr. Kirt Boys, [facility] MD, this is a related admission.   Visited patient at the bedside. His eyes open to voice, he has a meal tray that has not been touched yet. He appears to be comfortable and well cared for.   Dean Harris remains inpatient appropriate due to the need of IV fluids and antibiotics.   V/S: 97.8 oral, 129/63, HR 78, RR 20, SPO2 95% on O2 @ 3 lpm I&O: 635/1200 Labs: Na 146, Chloride 117, BUN 34, Cr 1.28, albumin 1.9, total protein 5.7, GFR 58, CRP 27.2 Diagnostics: Renal US - renal ultrasound ordered Case discussed with radiologist Dr. Fredia Sorrow, CT scan abdomen pelvis noncontrast will be obtained for prognostic purposes not operative candidate IV/PRNs: maxipeme 2 g IV BID, D5W @ 75 ml/hr, vancomycin 500 mg BID, morphine 2 mg IV x 1 within the last 24 hours   Problem List: - severe sepsis likely RLL PNA and kidney abscess - IV abx, plan for CT of kidney, diet advanced to honey thick - advanced dementia - under hospice services for this   GOC: Family wants to treat the treatable currently. They want to continue with hydration and ABX. D/C planning: ongoing Family: not present, had just left the room   IDT: hospice team updated   Should pt need ambulance transport, please call  [facility] as they contract this service for our active hospice patients.   Thank you, Wallis Bamberg RN, BSN, CCRN [facility] Liaison

## 2021-05-19 NOTE — Progress Notes (Signed)
Pharmacy Antibiotic Note  Dean Harris is a 80 y.o. male admitted on 05/16/2021 with sepsis secondary to unknown source.  Pharmacy has been consulted for Cefepime and vancomycin dosing x 7 day course. Kidney US scheduled today to investigate possible kidney abscess as well. Will follow-up on results and adjust abx accordingly.  SCr stable at 1.28 (BL ~ 1), WBC 8.4, afebrile.  Plan: -Continue cefepime 2 gm IV Q 12 hours -Continue vancomycin 500 mg IV Q 12 hours -Monitor CBC, renal fx, cultures and clinical progress -VP/VT levels ordered for 9/4 at 1800 and 9/5 at 0330  Weight: 65.3 kg (143 lb 15.4 oz)  Temp (24hrs), Avg:99 F (37.2 C), Min:98.5 F (36.9 C), Max:99.9 F (37.7 C)  Recent Labs  Lab 05/16/21 1222 05/16/21 1323 05/16/21 1955 05/17/21 1136 05/18/21 0007 05/19/21 0007  WBC  --  8.7 8.5 6.5 6.9 8.4  CREATININE  --  1.40* 1.24 1.23 1.36* 1.28*  LATICACIDVEN 1.3  --   --   --   --   --      CrCl cannot be calculated (Unknown ideal weight.).   Hand-calculation estimates 20mL/min  Allergies  Allergen Reactions   Dust Mite Extract Other (See Comments) and Cough    Sneezing, also   Mold Extract [Trichophyton] Other (See Comments)    Sneezing, also   Pollen Extract Other (See Comments)    Sneezing, also   Tetracyclines & Related Other (See Comments)    Generic only (??)    Antimicrobials this admission: Cefepime 9/1 >>  Vancomycin 9/1 >>   Microbiology results: 9/1 BCx: NG x3d 9/1 UCx: insignificant growth MRSA PCR: detected  Thank you for allowing pharmacy to be a part of this patient's care.  Georgeann Oppenheim, PharmD Pharmacy Resident 05/19/2021, 9:13 AM

## 2021-05-19 NOTE — Progress Notes (Signed)
  Speech Language Pathology Treatment: Dysphagia  Patient Details Name: Dean Harris MRN: [id number] DOB: 1941/05/04 Today's Date: 05/19/2021 Time: 8242-3536 SLP Time Calculation (min) (ACUTE ONLY): 25 min  Assessment / Plan / Recommendation Clinical Impression  Pt visited with granddaughters at bedside. Pt alert, able to respond to visual stimuli at times, opens mouth for spoon, indicates interest in PO. Pt consumed teaspoons of honey thick juice and puree with occasional bite reflex on spoon, but then immediate oral manipulation. Initially oral transit was swift, but after 10 minutes pts endurance waned and increased oral holding observed requiring verbal cues and dry spoon to elicit swallow. Discussed careful hand feeding methods to family, emphasized reading pts non-verbal cues (attention holding, fatigue) to determine when to stop. Encourage use of oral suction as needed to clear any held boluses. Pt may initiate purees and honey thick liquids via spoon. Discussed ongoing risk of aspiration with granddaughters who verbalized understanding. SLP will f/u for tolerance and management of diet. Pt may benefit from upgraded textures and possibly MBS prior to d/c.    HPI HPI: Pt is a 80 year old male from [facility] who presented to the ED  secondary to worsening cough, congestion and decreased responsiveness his family was notified. Pt was reportedly diagnosed with COVID 3 weeks ago; mild cough but otherwise was relatively asymptomatic then. CT chest on admission: Multi lobar pneumonia involving the right upper and right lower lobes. The right lower lobe is essentially completely consolidated.  New wall thickening and gas within the known large right upper pole renal cysts concerning for superinfection and abscess formation. Dx toxic encephalopathy. PMH: advanced dementia, nonverbal with behavioral disturbance, bedbound/wheelchair-bound, history of diabetes, hypertension,  history of CAD, followed by [facility] for the last 4 to 5 months, but not on comfort measures. BSE 05/10/20: additional time needed for mastication and oral clearance, but no s/sx of aspiration noted; dysphagia 3/thin liquids.      SLP Plan  Continue with current plan of care       Recommendations  Diet recommendations: Dysphagia 1 (puree);Honey-thick liquid Liquids provided via: Teaspoon Medication Administration: Crushed with puree Supervision: Full supervision/cueing for compensatory strategies;Trained caregiver to feed patient Compensations: Slow rate;Small sips/bites (check for oral holding) Postural Changes and/or Swallow Maneuvers: Seated upright 90 degrees                Oral Care Recommendations: Oral care QID;Staff/trained caregiver to provide oral care Follow up Recommendations: Skilled Nursing facility SLP Visit Diagnosis: Dysphagia, unspecified (R13.10) Plan: Continue with current plan of care       GO               Harlon Ditty, MA CCC-SLP  Acute Rehabilitation Services Pager [phone] Office [phone]  Dean Harris 05/19/2021, 12:14 PM

## 2021-05-19 NOTE — Progress Notes (Signed)
PROGRESS NOTE                                                                                                                                                                                                             Patient Demographics:    Jayland Null, is a 80 y.o. male, DOB - 07-17-1941, JXB:[id number]  Outpatient Primary MD for the patient is Bennie Pierini, FNP    LOS - 3  Admit date - 05/16/2021    Chief Complaint  Patient presents with   Altered Mental Status   Fever       Brief Narrative (HPI from H&P)   Dean Harris is a 80 year old male from [facility] with advanced dementia, nonverbal with behavioral disturbance, bedbound/wheelchair-bound, history of diabetes, hypertension, history of CAD, followed by [facility] for the last 4 to 5 months, was reportedly diagnosed with COVID 3 weeks ago, he had a mild cough but otherwise was relatively asymptomatic then, over the last couple of days had worsening cough, he subsequently became more was brought hospital where he was diagnosed with sepsis due to pneumonia right kidney abscess and sepsis.   Subjective:   Patient in bed appears to be in no distress, at baseline nonverbal, unable to answer questions.   Assessment  & Plan :   Severe sepsis (HCC)- POA - due to RLL PNA + possible R. Kidney Abscess - with Toxic Encephalopathy - he was status Hospice due to advanced dementia and poor quality of life at the ALF, sepsis pathophysiology is better, continue empiric IV ABX, not a surgical candidate, await palliative care to see to establish long-term goals of care, long term prognosis is very poor. Likely as underlying chronic aspiration, SLP following as well, detailed examination with patient's granddaughter at bedside on 05/19/2021.  They wish to pursue gentle medical treatment this admission, if declines again at SNF they want  to pursue full comfort measures at SNF due to passive admitting eating.  They realized that the aspiration is going to be recurrent and is related to patient's deconditioning and dementia and that underlying problem is not fixable.   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 Fredia Sorrow, CT scan abdomen pelvis noncontrast will be obtained for prognostic purposes not operative candidate.  Advanced dementia - Nonverbal with behavioral disturbance - Bed/wheelchair-bound -Followed by hospice, Pall. Care called, ? GOC, Discussed with granddaughter that he is not a candidate for PEG tube if he is noted to have severe dysphagia, on PRN Haldol   Recent COVID  - Reportedly diagnosed with COVID first weekend in August, over 3 weeks prior, no acute issues.   H/o CAD - not active at this time   Diabetes Mellitus -Hold Lantus, sliding scale insulin for now   CBG (last 3)  Recent Labs    05/19/21 0026 05/19/21 0338 05/19/21 0728  GLUCAP 174* 148* 173*           Condition - Extremely Guarded  Family Communication  : Granddaughter at bedside 05/17/21, 05/19/21  Code Status : DNR  Consults  : Palliative care  PUD Prophylaxis :    Procedures  :     CT - 1. Multi lobar pneumonia involving the right upper and right lower lobes. The right lower lobe is essentially completely consolidated. 2. New wall thickening and gas within the known large right upper pole renal cysts concerning for superinfection and abscess formation. This may be secondary complication in the setting of multilobar pneumonia and bacteremia. 3. No evidence of acute pulmonary embolus. 4. Aortic and coronary artery atherosclerotic calcifications. 5. Sequelae of old granulomatous disease with bilateral calcified hilar lymph nodes and punctate calcifications throughout the spleen. Aortic Atherosclerosis      Disposition Plan  :    Status is:  Inpatient  Remains inpatient appropriate because:IV treatments appropriate due to intensity of illness or inability to take PO  Dispo: The patient is from: SNF              Anticipated d/c is to: SNF              Patient currently is not medically stable to d/c.   Difficult to place patient No  DVT Prophylaxis  :    enoxaparin (LOVENOX) injection 40 mg Start: 05/16/21 2000    Lab Results  Component Value Date   PLT 182 05/19/2021    Diet :  Diet Order             Diet NPO time specified Except for: Sips with Meds  Diet effective now                    Inpatient Medications  Scheduled Meds:  Chlorhexidine Gluconate Cloth  6 each Topical Q0600   enoxaparin (LOVENOX) injection  40 mg Subcutaneous Q24H   insulin aspart  0-9 Units Subcutaneous Q4H   memantine  10 mg Oral BID   mupirocin ointment  1 application Nasal BID   Continuous Infusions:  ceFEPime (MAXIPIME) IV 2 g (05/19/21 0923)   dextrose 75 mL/hr at 05/19/21 0741   vancomycin 500 mg (05/19/21 0343)   PRN Meds:.acetaminophen **OR** acetaminophen, haloperidol lactate, morphine injection, [DISCONTINUED] ondansetron **OR** ondansetron (ZOFRAN) IV  Antibiotics  :    Anti-infectives (From admission, onward)    Start     Dose/Rate Route Frequency Ordered Stop   05/17/21 0400  vancomycin (VANCOREADY) IVPB 500 mg/100 mL        500 mg 100 mL/hr over 60 Minutes Intravenous Every 12 hours 05/16/21 1456 05/24/21 0359   05/16/21 2200  ceFEPIme (MAXIPIME) 2 g in sodium chloride 0.9 % 100 mL IVPB        2 g 200 mL/hr over 30 Minutes Intravenous Every 12 hours 05/16/21 1456 05/23/21 2159  05/16/21 1300  vancomycin (VANCOREADY) IVPB 1250 mg/250 mL        1,250 mg 166.7 mL/hr over 90 Minutes Intravenous  Once 05/16/21 1222 05/16/21 1640   05/16/21 1230  ceFEPIme (MAXIPIME) 2 g in sodium chloride 0.9 % 100 mL IVPB        2 g 200 mL/hr over 30 Minutes Intravenous  Once 05/16/21 1222 05/16/21 1432   05/16/21 1230   metroNIDAZOLE (FLAGYL) IVPB 500 mg        500 mg 100 mL/hr over 60 Minutes Intravenous  Once 05/16/21 1222 05/16/21 1445        Time Spent in minutes  30   Susa Raring M.D on 05/19/2021 at 10:54 AM  To page go to [email or web address]   [facility] -  Office  [phone]  See all Orders from today for further details    Objective:   Vitals:   05/19/21 0000 05/19/21 0400 05/19/21 0729 05/19/21 0800  BP: (!) 127/57 (!) 167/69 (!) 165/99 (!) 156/60  Pulse: 68 79 77 75  Resp: Temp: 98.9 F (37.2 C) 98.5 F (36.9 C) 98.7 F (37.1 C)   TempSrc: Oral Oral Axillary   SpO2: 94% 91% 96% 95%  Weight:        Wt Readings from Last 3 Encounters:  05/17/21 65.3 kg  07/15/20 66.7 kg  05/10/20 66.7 kg     Intake/Output Summary (Last 24 hours) at 05/19/2021 1054 Last data filed at 05/19/2021 0552 Gross per 24 hour  Intake 635.51 ml  Output 1200 ml  Net -564.49 ml     Physical Exam  Awake but unable to answer questions due to advanced dementia, moving all 4 extremities by himself [place].AT,PERRAL Supple Neck,No JVD, No cervical lymphadenopathy appriciated.  Symmetrical Chest wall movement, Good air movement bilaterally, CTAB RRR,No Gallops, Rubs or new Murmurs, No Parasternal Heave +ve B.Sounds, Abd Soft, No tenderness, No organomegaly appriciated, No rebound - guarding or rigidity. No Cyanosis, Clubbing or edema, No new Rash or bruise    RN pressure injury documentation: Pressure Injury 08/01/20 Coccyx Medial Stage 1 -  Intact skin with non-blanchable redness of a localized area usually over a bony prominence. (Active)  08/01/20 1029  Location: Coccyx  Location Orientation: Medial  Staging: Stage 1 -  Intact skin with non-blanchable redness of a localized area usually over a bony prominence.  Wound Description (Comments):   Present on Admission: Yes     Pressure Injury 05/17/21 Heel Left;Medial Unstageable - Full thickness tissue loss in which the base of  the injury is covered by slough (yellow, tan, gray, green or brown) and/or eschar (tan, brown or black) in the wound bed. (Active)  05/17/21 0300  Location: Heel  Location Orientation: Left;Medial  Staging: Unstageable - Full thickness tissue loss in which the base of the injury is covered by slough (yellow, tan, gray, green or brown) and/or eschar (tan, brown or black) in the wound bed.  Wound Description (Comments):   Present on Admission: Yes     Data Review:    CBC Recent Labs  Lab 05/16/21 1323 05/16/21 1706 05/16/21 1955 05/17/21 1136 05/18/21 0007 05/19/21 0007  WBC 8.7  --  8.5 6.5 6.9 8.4  HGB 12.6* 11.6* 11.4* 11.4* 11.2* 11.8*  HCT 39.2 34.0* 36.2* 35.5* 34.6* 36.9*  PLT 164  --  162 153 145* 182  MCV 88.7  --  90.5 88.3 88.7 88.7  MCH 28.5  --  28.5 28.4 28.7  28.4  MCHC 32.1  --  31.5 32.1 32.4 32.0  RDW 14.5  --  14.6 14.6 14.6 14.6  LYMPHSABS 0.8  --   --   --  1.0 1.3  MONOABS 0.6  --   --   --  0.5 0.7  EOSABS 0.0  --   --   --  0.0 0.0  BASOSABS 0.0  --   --   --  0.0 0.0    Recent Labs  Lab 05/16/21 1222 05/16/21 1323 05/16/21 1706 05/16/21 1955 05/17/21 1136 05/18/21 0007 05/19/21 0007  NA  --  143 144  --  142 145 146*  K  --  3.9 4.1  --  3.7 3.5 3.7  CL  --  111  --   --  113* 114* 117*  CO2  --  23  --   --  22 21* 23  GLUCOSE  --  267*  --   --  166* 159* 171*  BUN  --  30*  --   --  32* 32* 34*  CREATININE  --  1.40*  --  1.24 1.23 1.36* 1.28*  CALCIUM  --  8.2*  --   --  8.2* 8.1* 8.3*  AST  --  29  --   --  24 24 33  ALT  --  22  --   --  23 22 30   ALKPHOS  --  60  --   --  52 46 49  BILITOT  --  1.9*  --   --  1.4* 1.2 0.9  ALBUMIN  --  2.4*  --   --  2.2* 2.0* 1.9*  MG  --   --   --   --  2.1 2.1 2.3  CRP  --   --   --   --  32.5* 28.1* 27.2*  PROCALCITON  --   --   --   --  2.64 2.39 1.33  LATICACIDVEN 1.3  --   --   --   --   --   --   INR  --  1.4*  --   --   --   --   --   HGBA1C  --   --   --  8.0*  --   --   --   BNP   --   --   --   --  108.6* 98.7 87.3    ------------------------------------------------------------------------------------------------------------------ No results for input(s): CHOL, HDL, LDLCALC, TRIG, CHOLHDL, LDLDIRECT in the last 72 hours.  Lab Results  Component Value Date   HGBA1C 8.0 (H) 05/16/2021   ------------------------------------------------------------------------------------------------------------------ No results for input(s): TSH, T4TOTAL, T3FREE, THYROIDAB in the last 72 hours.  Invalid input(s): FREET3  Cardiac Enzymes No results for input(s): CKMB, TROPONINI, MYOGLOBIN in the last 168 hours.  Invalid input(s): CK ------------------------------------------------------------------------------------------------------------------    Component Value Date/Time   BNP 87.3 05/19/2021 0007     Radiology Reports CT HEAD WO CONTRAST (07/19/2021)  Result Date: 05/16/2021 CLINICAL DATA:  Delirium EXAM: CT HEAD WITHOUT CONTRAST TECHNIQUE: Contiguous axial images were obtained from the base of the skull through the vertex without intravenous contrast. COMPARISON:  CT head 01/22/2021, brain MRI 01/11/2020 FINDINGS: Brain: There is no evidence of acute intracranial hemorrhage, extra-axial fluid collection, or infarct. There is moderate to severe global parenchymal volume loss with ex vacuo dilatation of the ventricular system, not significantly changed. Hypodensity in the subcortical and periventricular white matter is also not significantly  changed, likely reflecting sequela of chronic white matter microangiopathy. There is no mass lesion. There is no midline shift. Vascular: There is calcification of the bilateral cavernous ICAs. Skull: Normal. Negative for fracture or focal lesion. Sinuses/Orbits: The paranasal sinuses are clear. The globes and orbits are unremarkable. Other: There is a left mastoid effusion, unchanged. IMPRESSION: 1. No acute intracranial pathology. 2. Unchanged  global parenchymal volume loss and chronic white matter microangiopathy. 3. Unchanged left mastoid effusion. Electronically Signed   By: Lesia Hausen M.D.   On: 05/16/2021 15:04   CT Angio Chest PE W/Cm &/Or Wo Cm  Result Date: 05/16/2021 CLINICAL DATA:  PE suspected, high probability. EXAM: CT ANGIOGRAPHY CHEST WITH CONTRAST TECHNIQUE: Multidetector CT imaging of the chest was performed using the standard protocol during bolus administration of intravenous contrast. Multiplanar CT image reconstructions and MIPs were obtained to evaluate the vascular anatomy. CONTRAST:  65mL OMNIPAQUE IOHEXOL 350 MG/ML SOLN COMPARISON:  CT abdomen/pelvis 01/10/2020 FINDINGS: Cardiovascular: Satisfactory opacification of the pulmonary arteries to the segmental level. No evidence of pulmonary embolism. Normal heart size. No pericardial effusion. Atherosclerotic calcifications present throughout the thoracic aorta, branch arteries and coronary arteries. Calcifications also present involving the aortic valve. Mediastinum/Nodes: Calcified hilar lymph nodes. No mediastinal mass. Unremarkable thoracic esophagus. Lungs/Pleura: New gas within the previously identified large right upper pole renal cyst. Additionally, the margin appears slightly thickened. Calcified granuloma in the periphery of the lingula and within the medial left lower lobe. Extensive bronchial wall thickening throughout the right lung. Upper Abdomen: New gas and wall thickening involving the known large right upper pole renal cyst. Punctate calcifications throughout the spleen consistent with sequelae of granulomatous disease. Musculoskeletal: No acute fracture or malalignment. 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 Review of the MIP images confirms the above findings. IMPRESSION: 1. Multi lobar pneumonia involving the right upper and right lower lobes. The right lower lobe is essentially completely  consolidated. 2. New wall thickening and gas within the known large right upper pole renal cysts concerning for superinfection and abscess formation. This may be secondary complication in the setting of multilobar pneumonia and bacteremia. 3. No evidence of acute pulmonary embolus. 4. Aortic and coronary artery atherosclerotic calcifications. 5. Sequelae of old granulomatous disease with bilateral calcified hilar lymph nodes and punctate calcifications throughout the spleen. Aortic Atherosclerosis (ICD10-I70.0). Electronically Signed   By: Malachy Moan M.D.   On: 05/16/2021 15:52   US RENAL  Result Date: 05/18/2021 CLINICAL DATA:  Acute kidney injury, sepsis and possible right renal abscess. EXAM: RENAL / URINARY TRACT ULTRASOUND COMPLETE COMPARISON:  CTA of the chest on 05/16/2021 FINDINGS: Right Kidney: Renal measurements: 10.1 x 5.5 x 5.6 cm = volume: 163 mL. Visualized renal collecting system of the right kidney demonstrates at least mild hydronephrosis. There is an elongated complex fluid collection abutting and compressing the kidney measuring roughly 18.5 x 8.3 x 9.7 cm. This likely corresponds to the partially visualized fluid collection above the kidney on the CTA of the chest which also contained some small locules of gas. This could represent a renal abscess/infected renal cyst. There may also be some degree of chronic UPJ obstruction and part of the appearance may relate to a dilated renal pelvis. Left Kidney: Renal measurements: 9.8 x 4.8 x 5.4 cm = volume: 130 mL. Echogenicity within normal limits. No mass or hydronephrosis visualized. Bladder: Appears normal for degree of bladder distention. Other: None. IMPRESSION: Large elongated complex fluid collection that is associated  with the right kidney and may be causing some degree of compressive hydronephrosis. This may represent an infected renal cyst. There also may be some component of chronic obstruction of the collecting system. Anatomic  delineation is difficult by ultrasound and CT of the abdomen and pelvis is recommended. If IV contrast could not be administered due to acute kidney injury, the study can be performed without contrast. Electronically Signed   By: Irish LackGlenn  Yamagata M.D.   On: 05/18/2021 15:52   DG Chest Port 1 View  Result Date: 05/16/2021 CLINICAL DATA:  Questionable sepsis - evaluate for abnormality EXAM: PORTABLE CHEST 1 VIEW COMPARISON:  Radiograph 07/31/2020 FINDINGS: The patient is rotated towards the right. Unchanged cardiomediastinal silhouette. There is new opacification throughout the right lung, worst in the mid to lower lung. The left lung appears clear. There is no visible pneumothorax. There is no acute osseous abnormality. IMPRESSION: Opacification throughout the right hemithorax, worst in the right mid to lower lung, could represent pleural effusion with mid to lower lung airspace disease. Electronically Signed   By: Caprice RenshawJacob  Kahn M.D.   On: 05/16/2021 13:21

## 2021-05-20 LAB — COMPREHENSIVE METABOLIC PANEL
ALT: 29 U/L (ref 0–44)
AST: 24 U/L (ref 15–41)
Albumin: 1.9 g/dL — ABNORMAL LOW (ref 3.5–5.0)
Alkaline Phosphatase: 52 U/L (ref 38–126)
Anion gap: 10 (ref 5–15)
BUN: 20 mg/dL (ref 8–23)
CO2: 25 mmol/L (ref 22–32)
Calcium: 8.4 mg/dL — ABNORMAL LOW (ref 8.9–10.3)
Chloride: 105 mmol/L (ref 98–111)
Creatinine, Ser: 1.08 mg/dL (ref 0.61–1.24)
GFR, Estimated: >60 mL/min (ref >60)
Glucose, Bld: 223 mg/dL — ABNORMAL HIGH (ref 70–99)
Potassium: 3.4 mmol/L — ABNORMAL LOW (ref 3.5–5.1)
Sodium: 140 mmol/L (ref 135–145)
Total Bilirubin: 0.6 mg/dL (ref 0.3–1.2)
Total Protein: 5.6 g/dL — ABNORMAL LOW (ref 6.5–8.1)

## 2021-05-20 LAB — GLUCOSE, CAPILLARY
Glucose-Capillary: 213 mg/dL — ABNORMAL HIGH (ref 70–99)
Glucose-Capillary: 226 mg/dL — ABNORMAL HIGH (ref 70–99)
Glucose-Capillary: 226 mg/dL — ABNORMAL HIGH (ref 70–99)
Glucose-Capillary: 263 mg/dL — ABNORMAL HIGH (ref 70–99)
Glucose-Capillary: 312 mg/dL — ABNORMAL HIGH (ref 70–99)

## 2021-05-20 LAB — CBC WITH DIFFERENTIAL/PLATELET
Abs Immature Granulocytes: 0.32 10*3/uL — ABNORMAL HIGH (ref 0.00–0.07)
Basophils Absolute: 0.1 10*3/uL (ref 0.0–0.1)
Basophils Relative: 1 %
Eosinophils Absolute: 0.2 10*3/uL (ref 0.0–0.5)
Eosinophils Relative: 2 %
HCT: 36.2 % — ABNORMAL LOW (ref 39.0–52.0)
Hemoglobin: 11.4 g/dL — ABNORMAL LOW (ref 13.0–17.0)
Immature Granulocytes: 4 %
Lymphocytes Relative: 17 %
Lymphs Abs: 1.4 10*3/uL (ref 0.7–4.0)
MCH: 27.9 pg (ref 26.0–34.0)
MCHC: 31.5 g/dL (ref 30.0–36.0)
MCV: 88.7 fL (ref 80.0–100.0)
Monocytes Absolute: 0.7 10*3/uL (ref 0.1–1.0)
Monocytes Relative: 9 %
Neutro Abs: 5.9 10*3/uL (ref 1.7–7.7)
Neutrophils Relative %: 67 %
Platelets: 212 10*3/uL (ref 150–400)
RBC: 4.08 MIL/uL — ABNORMAL LOW (ref 4.22–5.81)
RDW: 14.6 % (ref 11.5–15.5)
WBC: 8.6 10*3/uL (ref 4.0–10.5)
nRBC: 0 % (ref 0.0–0.2)

## 2021-05-20 LAB — MAGNESIUM: Magnesium: 2.1 mg/dL (ref 1.7–2.4)

## 2021-05-20 LAB — VANCOMYCIN, TROUGH: Vancomycin Tr: 15 ug/mL (ref 15–20)

## 2021-05-20 NOTE — Progress Notes (Signed)
Dean Harris 3Y10  [facility]) Hospitalized Hospice Patient   Dean Harris is a current hospice patient with a terminal diagnosis of Alzheimer's Dementia. In the days leading to admission, Dean Harris was notably less responsive at his facility. On 05/16/21 his family requested he be sent to the ED for evaluation. Pt was evaluated at [facility] Emergency Department and admitted with a diagnosis of severe sepsis. Per Dr. Kirt Boys, [facility] MD, this is a related admission.   Visited, no family present. Patient appears at baseline, awake, non verbal. Spoke with granddaughter by phone following visit and she reports patient ate for her today. Anticipate discharge in 1-2 days.  Dean Harris remains inpatient appropriate due to the need of IV fluids and antibiotics.   VS: 98.8, 134/63, 83, 18, 95%3Lnc I/O: 1425/1275 Labs:  05/20/2021 06:39 Potassium: 3.4 (L) Glucose: 223 (H) Calcium: 8.4 (L) Albumin: 1.9 (L) Total Protein: 5.6 (L) RBC: 4.08 (L) Hemoglobin: 11.4 (L) HCT: 36.2 (L)  IV/PRNs: maxipeme 2 g IV BID, D5W @ 75 ml/hr, vancomycin 500 mg BID, No PRNs today  Problem List: - severe sepsis likely RLL PNA and kidney abscess - IV abx, plan for CT of kidney, diet advanced to honey thick - advanced dementia - under hospice services for this   GOC: Family wants to treat the treatable currently. They want to continue with hydration and ABX. D/C planning: ongoing Family: spoke to family by phone IDT: hospice team updated   Should pt need ambulance transport, please call  [facility] as they contract this service for our active hospice patients.  Please do not hesitate to call with questions.   Thank you,   Elsie Saas, RN, [facility]      [facility] Liaison   [phone]

## 2021-05-20 NOTE — Progress Notes (Signed)
PROGRESS NOTE                                                                                                                                                                                                             Patient Demographics:    Dean Harris, is a 80 y.o. male, DOB - 1940-09-29, PZW:[id number]  Outpatient Primary MD for the patient is Bennie Pierini, FNP    LOS - 4  Admit date - 05/16/2021    Chief Complaint  Patient presents with   Altered Mental Status   Fever       Brief Narrative (HPI from H&P)   Dean Harris is a 80 year old male from [facility] with advanced dementia, nonverbal with behavioral disturbance, bedbound/wheelchair-bound, history of diabetes, hypertension, history of CAD, followed by [facility] for the last 4 to 5 months, was reportedly diagnosed with COVID 3 weeks ago, he had a mild cough but otherwise was relatively asymptomatic then, over the last couple of days had worsening cough, he subsequently became more was brought hospital where he was diagnosed with sepsis due to pneumonia right kidney abscess and sepsis.   Subjective:   Patient in bed appears to be in no distress, at baseline nonverbal, unable to answer questions.   Assessment  & Plan :   Severe sepsis (HCC)- POA - due to RLL PNA + possible R. Kidney Abscess - with Toxic Encephalopathy - he was status Hospice due to advanced dementia and poor quality of life at the ALF, sepsis pathophysiology is better, continue empiric IV ABX, not a surgical candidate, await palliative care to see to establish long-term goals of care, long term prognosis is very poor. Likely as underlying chronic aspiration, SLP following as well, detailed examination with patient's granddaughter at bedside on 05/19/2021.  They wish to pursue gentle medical treatment this admission, if declines again at SNF they want  to pursue full comfort measures at SNF due to passive admitting eating.  They realized that the aspiration is going to be recurrent and is related to patient's deconditioning and dementia and that underlying problem is not fixable.   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 Fredia Sorrow, CT scan abdomen pelvis done for prognostic purposes, DW granddaughters bedside 05/20/21 - Med Rx only,  not operative candidate, if significant decline Full Comfort care.   Advanced dementia - Nonverbal with behavioral disturbance - Bed/wheelchair-bound -Followed by hospice, Pall. Care called, ? GOC, Discussed with granddaughter that he is not a candidate for PEG tube if he is noted to have severe dysphagia, on PRN Haldol   Recent COVID  - Reportedly diagnosed with COVID first weekend in August, over 3 weeks prior, no acute issues.   H/o CAD - not active at this time   Diabetes Mellitus -Hold Lantus, sliding scale insulin for now   CBG (last 3)  Recent Labs    05/20/21 0401 05/20/21 0815 05/20/21 1228  GLUCAP 213* 226* 263*           Condition - Extremely Guarded  Family Communication  : Granddaughter at bedside 05/17/21, 05/19/21, 05/20/21  Code Status : DNR  Consults  : Palliative care  PUD Prophylaxis :    Procedures  :     CT ABD - 1. 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 The upper pole moiety demonstrates urothelial wall thickening with gas throughout the collecting system, as seen on recent chest CT. Findings reflecting emphysematous pyelitis. 2. Consolidation in the right lung base with small right pleural effusion and multifocal centrilobular/ tree-in-bud opacities throughout the right lung, similar prior chest CT, reflecting multifocal pneumonia. 3. Chronic  pancreatitis. 4.  Aortic Atherosclerosis  CT Chest -  1. Multi lobar pneumonia involving the right upper and right lower lobes. The right lower lobe is essentially completely consolidated. 2. New wall thickening and gas within the known large right upper pole renal cysts concerning for superinfection and abscess formation. This may be secondary complication in the setting of multilobar pneumonia and bacteremia. 3. No evidence of acute pulmonary embolus. 4. Aortic and coronary artery atherosclerotic calcifications. 5. Sequelae of old granulomatous disease with bilateral calcified hilar lymph nodes and punctate calcifications throughout the spleen. Aortic Atherosclerosis      Disposition Plan  :    Status is: Inpatient  Remains inpatient appropriate because:IV treatments appropriate due to intensity of illness or inability to take PO  Dispo: The patient is from: SNF              Anticipated d/c is to: SNF              Patient currently is not medically stable to d/c.   Difficult to place patient No  DVT Prophylaxis  :    enoxaparin (LOVENOX) injection 40 mg Start: 05/16/21 2000    Lab Results  Component Value Date   PLT 212 05/20/2021    Diet :  Diet Order             DIET - DYS 1 Room service appropriate? No; Fluid consistency: Honey Thick  Diet effective now                    Inpatient Medications  Scheduled Meds:  Chlorhexidine Gluconate Cloth  6 each Topical Q0600   enoxaparin (LOVENOX) injection  40 mg Subcutaneous Q24H   insulin aspart  0-9 Units Subcutaneous Q4H   memantine  10 mg Oral BID   mupirocin ointment  1 application Nasal BID   Continuous Infusions:  ceFEPime (MAXIPIME) IV 2 g (05/20/21 0959)   dextrose 75 mL/hr at 05/19/21 2155   vancomycin 500 mg (05/20/21 0457)  PRN Meds:.acetaminophen **OR** acetaminophen, haloperidol lactate, morphine injection, [DISCONTINUED] ondansetron **OR** ondansetron (ZOFRAN) IV  Antibiotics  :    Anti-infectives  (From admission, onward)    Start     Dose/Rate Route Frequency Ordered Stop   05/17/21 0400  vancomycin (VANCOREADY) IVPB 500 mg/100 mL        500 mg 100 mL/hr over 60 Minutes Intravenous Every 12 hours 05/16/21 1456 05/24/21 0359   05/16/21 2200  ceFEPIme (MAXIPIME) 2 g in sodium chloride 0.9 % 100 mL IVPB        2 g 200 mL/hr over 30 Minutes Intravenous Every 12 hours 05/16/21 1456 05/23/21 2159   05/16/21 1300  vancomycin (VANCOREADY) IVPB 1250 mg/250 mL        1,250 mg 166.7 mL/hr over 90 Minutes Intravenous  Once 05/16/21 1222 05/16/21 1640   05/16/21 1230  ceFEPIme (MAXIPIME) 2 g in sodium chloride 0.9 % 100 mL IVPB        2 g 200 mL/hr over 30 Minutes Intravenous  Once 05/16/21 1222 05/16/21 1432   05/16/21 1230  metroNIDAZOLE (FLAGYL) IVPB 500 mg        500 mg 100 mL/hr over 60 Minutes Intravenous  Once 05/16/21 1222 05/16/21 1445        Time Spent in minutes  30   Susa Raring M.D on 05/20/2021 at 1:20 PM  To page go to [email or web address]   [facility] -  Office  [phone]  See all Orders from today for further details    Objective:   Vitals:   05/20/21 0021 05/20/21 0402 05/20/21 0808 05/20/21 1231  BP: (!) 140/57 (!) 159/56 (!) 144/58 134/63  Pulse: 76 79 79 83  Resp: 20 16 18 18   Temp: 98.1 F (36.7 C) 98.2 F (36.8 C) 99.5 F (37.5 C) 98.8 F (37.1 C)  TempSrc: Oral Oral Skin Oral  SpO2: 95% 94% 95% (!) 9%  Weight:        Wt Readings from Last 3 Encounters:  05/17/21 65.3 kg  07/15/20 66.7 kg  05/10/20 66.7 kg     Intake/Output Summary (Last 24 hours) at 05/20/2021 1320 Last data filed at 05/20/2021 07/20/2021 Gross per 24 hour  Intake 1425.05 ml  Output 900 ml  Net 525.05 ml     Physical Exam  Awake but unable to answer questions due to advanced dementia, moving all 4 extremities by himself [place].AT,PERRAL Supple Neck,No JVD, No cervical lymphadenopathy appriciated.  Symmetrical Chest wall movement, Good air movement bilaterally,  CTAB RRR,No Gallops, Rubs or new Murmurs, No Parasternal Heave +ve B.Sounds, Abd Soft, No tenderness, No organomegaly appriciated, No rebound - guarding or rigidity. No Cyanosis, Clubbing or edema, No new Rash or bruise    RN pressure injury documentation: Pressure Injury 08/01/20 Coccyx Medial Stage 1 -  Intact skin with non-blanchable redness of a localized area usually over a bony prominence. (Active)  08/01/20 1029  Location: Coccyx  Location Orientation: Medial  Staging: Stage 1 -  Intact skin with non-blanchable redness of a localized area usually over a bony prominence.  Wound Description (Comments):   Present on Admission: Yes     Pressure Injury 05/17/21 Heel Left;Medial Unstageable - Full thickness tissue loss in which the base of the injury is covered by slough (yellow, tan, gray, green or brown) and/or eschar (tan, brown or black) in the wound bed. (Active)  05/17/21 0300  Location: Heel  Location Orientation: Left;Medial  Staging: Unstageable - Full thickness tissue loss in which the  base of the injury is covered by slough (yellow, tan, gray, green or brown) and/or eschar (tan, brown or black) in the wound bed.  Wound Description (Comments):   Present on Admission: Yes     Data Review:    CBC Recent Labs  Lab 05/16/21 1323 05/16/21 1706 05/16/21 1955 05/17/21 1136 05/18/21 0007 05/19/21 0007 05/20/21 0639  WBC 8.7  --  8.5 6.5 6.9 8.4 8.6  HGB 12.6*   < > 11.4* 11.4* 11.2* 11.8* 11.4*  HCT 39.2   < > 36.2* 35.5* 34.6* 36.9* 36.2*  PLT 164  --  162 153 145* 182 212  MCV 88.7  --  90.5 88.3 88.7 88.7 88.7  MCH 28.5  --  28.5 28.4 28.7 28.4 27.9  MCHC 32.1  --  31.5 32.1 32.4 32.0 31.5  RDW 14.5  --  14.6 14.6 14.6 14.6 14.6  LYMPHSABS 0.8  --   --   --  1.0 1.3 1.4  MONOABS 0.6  --   --   --  0.5 0.7 0.7  EOSABS 0.0  --   --   --  0.0 0.0 0.2  BASOSABS 0.0  --   --   --  0.0 0.0 0.1   < > = values in this interval not displayed.    Recent Labs  Lab  05/16/21 1222 05/16/21 1323 05/16/21 1323 05/16/21 1706 05/16/21 1955 05/17/21 1136 05/18/21 0007 05/19/21 0007 05/20/21 0639  NA  --  143   < > 144  --  142 145 146* 140  K  --  3.9   < > 4.1  --  3.7 3.5 3.7 3.4*  CL  --  111  --   --   --  113* 114* 117* 105  CO2  --  23  --   --   --  22 21* 23 25  GLUCOSE  --  267*  --   --   --  166* 159* 171* 223*  BUN  --  30*  --   --   --  32* 32* 34* 20  CREATININE  --  1.40*   < >  --  1.24 1.23 1.36* 1.28* 1.08  CALCIUM  --  8.2*  --   --   --  8.2* 8.1* 8.3* 8.4*  AST  --  29  --   --   --  24 24 33 24  ALT  --  22  --   --   --  ALKPHOS  --  60  --   --   --  52 46 49 52  BILITOT  --  1.9*  --   --   --  1.4* 1.2 0.9 0.6  ALBUMIN  --  2.4*  --   --   --  2.2* 2.0* 1.9* 1.9*  MG  --   --   --   --   --  2.1 2.1 2.3 2.1  CRP  --   --   --   --   --  32.5* 28.1* 27.2*  --   PROCALCITON  --   --   --   --   --  2.64 2.39 1.33  --   LATICACIDVEN 1.3  --   --   --   --   --   --   --   --   INR  --  1.4*  --   --   --   --   --   --   --  HGBA1C  --   --   --   --  8.0*  --   --   --   --   BNP  --   --   --   --   --  108.6* 98.7 87.3  --    < > = values in this interval not displayed.    ------------------------------------------------------------------------------------------------------------------ No results for input(s): CHOL, HDL, LDLCALC, TRIG, CHOLHDL, LDLDIRECT in the last 72 hours.  Lab Results  Component Value Date   HGBA1C 8.0 (H) 05/16/2021   ------------------------------------------------------------------------------------------------------------------ No results for input(s): TSH, T4TOTAL, T3FREE, THYROIDAB in the last 72 hours.  Invalid input(s): FREET3  Cardiac Enzymes No results for input(s): CKMB, TROPONINI, MYOGLOBIN in the last 168 hours.  Invalid input(s): CK ------------------------------------------------------------------------------------------------------------------    Component  Value Date/Time   BNP 87.3 05/19/2021 0007     Radiology Reports CT ABDOMEN PELVIS WO CONTRAST  Result Date: 05/19/2021 CLINICAL DATA:  Sepsis EXAM: CT ABDOMEN AND PELVIS WITHOUT CONTRAST TECHNIQUE: Multidetector CT imaging of the abdomen and pelvis was performed following the standard protocol without IV contrast. COMPARISON:  Renal ultrasound May 18, 2021, CT chest May 16, 2021, and CT abdomen/pelvis January 10, 2020. FINDINGS: Despite efforts by the technologist and patient, motion artifact is present on today's exam and could not be eliminated. This reduces exam sensitivity and specificity. Lower chest: Consolidation in the right lung base with small right pleural effusion and multifocal centrilobular/ tree-in-bud opacities throughout the right lung. Hepatobiliary: Unremarkable noncontrast appearance of the hepatic parenchyma. Layering dependent sludge in the gallbladder. No evidence of a acute gallbladder inflammation. No biliary ductal dilation. Pancreas: Pancreatic atrophy and multifocal calcifications, sequela of chronic pancreatitis. Spleen: Unremarkable. Adrenals/Urinary Tract: Bilateral adrenal glands are unremarkable. Left kidney is without hydronephrosis. Multifocal tiny calcifications within the left renal hila likely reflecting combination nephrolithiasis or vascular calcifications. Duplicated right renal collecting system. 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 The upper pole moiety demonstrates urothelial wall thickening with gas throughout the collecting system, as seen on recent chest CT. Gas in the urinary bladder. Stomach/Bowel: Small hiatal hernia otherwise the stomach is unremarkable for degree of distension. No pathologic dilation of small or large bowel. No evidence of acute bowel inflammation. Vascular/Lymphatic: Aortic atherosclerosis. No pathologically  enlarged abdominal or pelvic lymph nodes. Reproductive: Ectopic insertion of the right duplicated system on the prostate. Other: No walled off fluid collection.  No pneumoperitoneum. Musculoskeletal: Multilevel degenerative changes spine. No acute osseous abnormality. IMPRESSION: 1. 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 The upper pole moiety demonstrates urothelial wall thickening with gas throughout the collecting system, as seen on recent chest CT. Findings reflecting emphysematous pyelitis. 2. Consolidation in the right lung base with small right pleural effusion and multifocal centrilobular/ tree-in-bud opacities throughout the right lung, similar prior chest CT, reflecting multifocal pneumonia. 3. Chronic pancreatitis. 4.  Aortic Atherosclerosis (ICD10-I70.0). These results will be called to the ordering clinician or representative by the Radiologist Assistant, and communication documented in the PACS or [employer]. Electronically Signed   By: Maudry Mayhew M.D.   On: 05/19/2021 15:30   CT HEAD WO CONTRAST ( )  Result Date: 05/16/2021 CLINICAL DATA:  Delirium EXAM: CT HEAD WITHOUT CONTRAST TECHNIQUE: Contiguous axial images were obtained from the  base of the skull through the vertex without intravenous contrast. COMPARISON:  CT head 01/22/2021, brain MRI 01/11/2020 FINDINGS: Brain: There is no evidence of acute intracranial hemorrhage, extra-axial fluid collection, or infarct. There is moderate to severe global parenchymal volume loss with ex vacuo dilatation of the ventricular system, not significantly changed. Hypodensity in the subcortical and periventricular white matter is also not significantly changed, likely reflecting sequela of chronic white matter microangiopathy. There is no mass lesion. There is no midline shift. Vascular: There is  calcification of the bilateral cavernous ICAs. Skull: Normal. Negative for fracture or focal lesion. Sinuses/Orbits: The paranasal sinuses are clear. The globes and orbits are unremarkable. Other: There is a left mastoid effusion, unchanged. IMPRESSION: 1. No acute intracranial pathology. 2. Unchanged global parenchymal volume loss and chronic white matter microangiopathy. 3. Unchanged left mastoid effusion. Electronically Signed   By: Lesia Hausen M.D.   On: 05/16/2021 15:04   CT Angio Chest PE W/Cm &/Or Wo Cm  Result Date: 05/16/2021 CLINICAL DATA:  PE suspected, high probability. EXAM: CT ANGIOGRAPHY CHEST WITH CONTRAST TECHNIQUE: Multidetector CT imaging of the chest was performed using the standard protocol during bolus administration of intravenous contrast. Multiplanar CT image reconstructions and MIPs were obtained to evaluate the vascular anatomy. CONTRAST:  65mL OMNIPAQUE IOHEXOL 350 MG/ML SOLN COMPARISON:  CT abdomen/pelvis 01/10/2020 FINDINGS: Cardiovascular: Satisfactory opacification of the pulmonary arteries to the segmental level. No evidence of pulmonary embolism. Normal heart size. No pericardial effusion. Atherosclerotic calcifications present throughout the thoracic aorta, branch arteries and coronary arteries. Calcifications also present involving the aortic valve. Mediastinum/Nodes: Calcified hilar lymph nodes. No mediastinal mass. Unremarkable thoracic esophagus. Lungs/Pleura: New gas within the previously identified large right upper pole renal cyst. Additionally, the margin appears slightly thickened. Calcified granuloma in the periphery of the lingula and within the medial left lower lobe. Extensive bronchial wall thickening throughout the right lung. Upper Abdomen: New gas and wall thickening involving the known large right upper pole renal cyst. Punctate calcifications throughout the spleen consistent with sequelae of granulomatous disease. Musculoskeletal: No acute fracture or  malalignment. 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 Review of the MIP images confirms the above findings. IMPRESSION: 1. Multi lobar pneumonia involving the right upper and right lower lobes. The right lower lobe is essentially completely consolidated. 2. New wall thickening and gas within the known large right upper pole renal cysts concerning for superinfection and abscess formation. This may be secondary complication in the setting of multilobar pneumonia and bacteremia. 3. No evidence of acute pulmonary embolus. 4. Aortic and coronary artery atherosclerotic calcifications. 5. Sequelae of old granulomatous disease with bilateral calcified hilar lymph nodes and punctate calcifications throughout the spleen. Aortic Atherosclerosis (ICD10-I70.0). Electronically Signed   By: Malachy Moan M.D.   On: 05/16/2021 15:52   US RENAL  Result Date: 05/18/2021 CLINICAL DATA:  Acute kidney injury, sepsis and possible right renal abscess. EXAM: RENAL / URINARY TRACT ULTRASOUND COMPLETE COMPARISON:  CTA of the chest on 05/16/2021 FINDINGS: Right Kidney: Renal measurements: 10.1 x 5.5 x 5.6 cm = volume: 163 mL. Visualized renal collecting system of the right kidney demonstrates at least mild hydronephrosis. There is an elongated complex fluid collection abutting and compressing the kidney measuring roughly 18.5 x 8.3 x 9.7 cm. This likely corresponds to the partially visualized fluid collection above the kidney on the CTA of the chest which also contained some small locules of gas. This could represent a renal abscess/infected renal  cyst. There may also be some degree of chronic UPJ obstruction and part of the appearance may relate to a dilated renal pelvis. Left Kidney: Renal measurements: 9.8 x 4.8 x 5.4 cm = volume: 130 mL. Echogenicity within normal limits. No mass or hydronephrosis visualized. Bladder: Appears normal for degree of bladder distention.  Other: None. IMPRESSION: Large elongated complex fluid collection that is associated with the right kidney and may be causing some degree of compressive hydronephrosis. This may represent an infected renal cyst. There also may be some component of chronic obstruction of the collecting system. Anatomic delineation is difficult by ultrasound and CT of the abdomen and pelvis is recommended. If IV contrast could not be administered due to acute kidney injury, the study can be performed without contrast. Electronically Signed   By: Irish Lack M.D.   On: 05/18/2021 15:52   DG Chest Port 1 View  Result Date: 05/16/2021 CLINICAL DATA:  Questionable sepsis - evaluate for abnormality EXAM: PORTABLE CHEST 1 VIEW COMPARISON:  Radiograph 07/31/2020 FINDINGS: The patient is rotated towards the right. Unchanged cardiomediastinal silhouette. There is new opacification throughout the right lung, worst in the mid to lower lung. The left lung appears clear. There is no visible pneumothorax. There is no acute osseous abnormality. IMPRESSION: Opacification throughout the right hemithorax, worst in the right mid to lower lung, could represent pleural effusion with mid to lower lung airspace disease. Electronically Signed   By: Caprice Renshaw M.D.   On: 05/16/2021 13:21

## 2021-05-20 NOTE — Progress Notes (Signed)
Pharmacy Antibiotic Note  Dean Harris is a 80 y.o. male admitted on 05/16/2021 with sepsis secondary to unknown source.  Pharmacy has been consulted for Cefepime and vancomycin dosing x 7 day course. Kidney US scheduled today to investigate possible kidney abscess as well. Will follow-up on results and adjust abx accordingly.  SCr stable at 1.28 (BL ~ 1), WBC 8.4, afebrile. Vancomycin peak 24 trough 15 AUC 469 at goal 400-550   Plan: -Continue cefepime 2 gm IV Q 12 hours -Continue vancomycin 500 mg IV Q 12 hours -Monitor CBC, renal fx, cultures and clinical progress   Weight: 65.3 kg (143 lb 15.4 oz)  Temp (24hrs), Avg:98.6 F (37 C), Min:98.1 F (36.7 C), Max:99.5 F (37.5 C)  Recent Labs  Lab 05/16/21 1222 05/16/21 1323 05/16/21 1955 05/17/21 1136 05/18/21 0007 05/19/21 0007 05/19/21 1914 05/20/21 0639 05/20/21 1604  WBC  --    < > 8.5 6.5 6.9 8.4  --  8.6  --   CREATININE  --    < > 1.24 1.23 1.36* 1.28*  --  1.08  --   LATICACIDVEN 1.3  --   --   --   --   --   --   --   --   VANCOTROUGH  --   --   --   --   --   --   --   --  15  VANCOPEAK  --   --   --   --   --   --  24*  --   --    < > = values in this interval not displayed.     CrCl cannot be calculated (Unknown ideal weight.).   Hand-calculation estimates 2mL/min  Allergies  Allergen Reactions   Dust Mite Extract Other (See Comments) and Cough    Sneezing, also   Mold Extract [Trichophyton] Other (See Comments)    Sneezing, also   Pollen Extract Other (See Comments)    Sneezing, also   Tetracyclines & Related Other (See Comments)    Generic only (??)    Antimicrobials this admission: Cefepime 9/1 >>  Vancomycin 9/1 >>   Microbiology results: 9/1 BCx: NG x3d 9/1 UCx: insignificant growth MRSA PCR: detected   Leota Sauers Pharm.D. CPP, BCPS Clinical Pharmacist [phone] 05/20/2021 6:14 PM

## 2021-05-21 LAB — MAGNESIUM: Magnesium: 1.9 mg/dL (ref 1.7–2.4)

## 2021-05-21 LAB — COMPREHENSIVE METABOLIC PANEL
ALT: 47 U/L — ABNORMAL HIGH (ref 0–44)
AST: 48 U/L — ABNORMAL HIGH (ref 15–41)
Albumin: 1.9 g/dL — ABNORMAL LOW (ref 3.5–5.0)
Alkaline Phosphatase: 55 U/L (ref 38–126)
Anion gap: 7 (ref 5–15)
BUN: 15 mg/dL (ref 8–23)
CO2: 22 mmol/L (ref 22–32)
Calcium: 7.6 mg/dL — ABNORMAL LOW (ref 8.9–10.3)
Chloride: 108 mmol/L (ref 98–111)
Creatinine, Ser: 1.05 mg/dL (ref 0.61–1.24)
GFR, Estimated: >60 mL/min (ref >60)
Glucose, Bld: 186 mg/dL — ABNORMAL HIGH (ref 70–99)
Potassium: 3 mmol/L — ABNORMAL LOW (ref 3.5–5.1)
Sodium: 137 mmol/L (ref 135–145)
Total Bilirubin: 0.7 mg/dL (ref 0.3–1.2)
Total Protein: 5.5 g/dL — ABNORMAL LOW (ref 6.5–8.1)

## 2021-05-21 LAB — CBC WITH DIFFERENTIAL/PLATELET
Abs Immature Granulocytes: 0.47 10*3/uL — ABNORMAL HIGH (ref 0.00–0.07)
Basophils Absolute: 0.1 10*3/uL (ref 0.0–0.1)
Basophils Relative: 1 %
Eosinophils Absolute: 0.3 10*3/uL (ref 0.0–0.5)
Eosinophils Relative: 3 %
HCT: 34.1 % — ABNORMAL LOW (ref 39.0–52.0)
Hemoglobin: 11 g/dL — ABNORMAL LOW (ref 13.0–17.0)
Immature Granulocytes: 5 %
Lymphocytes Relative: 22 %
Lymphs Abs: 2 10*3/uL (ref 0.7–4.0)
MCH: 27.8 pg (ref 26.0–34.0)
MCHC: 32.3 g/dL (ref 30.0–36.0)
MCV: 86.3 fL (ref 80.0–100.0)
Monocytes Absolute: 0.7 10*3/uL (ref 0.1–1.0)
Monocytes Relative: 8 %
Neutro Abs: 5.6 10*3/uL (ref 1.7–7.7)
Neutrophils Relative %: 61 %
Platelets: 208 10*3/uL (ref 150–400)
RBC: 3.95 MIL/uL — ABNORMAL LOW (ref 4.22–5.81)
RDW: 14.1 % (ref 11.5–15.5)
WBC: 9.1 10*3/uL (ref 4.0–10.5)
nRBC: 0 % (ref 0.0–0.2)

## 2021-05-21 LAB — CULTURE, BLOOD (ROUTINE X 2)
Culture: NO GROWTH
Culture: NO GROWTH
Special Requests: ADEQUATE

## 2021-05-21 LAB — GLUCOSE, CAPILLARY
Glucose-Capillary: 199 mg/dL — ABNORMAL HIGH (ref 70–99)
Glucose-Capillary: 208 mg/dL — ABNORMAL HIGH (ref 70–99)
Glucose-Capillary: 211 mg/dL — ABNORMAL HIGH (ref 70–99)
Glucose-Capillary: 291 mg/dL — ABNORMAL HIGH (ref 70–99)

## 2021-05-21 MED ORDER — POTASSIUM CHLORIDE 10 MEQ/100ML IV SOLN
10.0000 meq | INTRAVENOUS | Status: AC
  Administered 2021-05-21 (×4): 10 meq via INTRAVENOUS
  Filled 2021-05-21 (×4): qty 100

## 2021-05-21 MED ORDER — SULFAMETHOXAZOLE-TRIMETHOPRIM 800-160 MG PO TABS: 1.0000 | ORAL_TABLET | Freq: Two times a day (BID) | ORAL | 0 refills | Status: AC

## 2021-05-21 MED ORDER — POTASSIUM CHLORIDE 20 MEQ PO PACK
40.0000 meq | PACK | Freq: Once | ORAL | Status: AC
  Administered 2021-05-21: 40 meq via ORAL
  Filled 2021-05-21: qty 2

## 2021-05-21 MED ORDER — CEPHALEXIN 500 MG PO CAPS: 500.0000 mg | ORAL_CAPSULE | Freq: Three times a day (TID) | ORAL | 0 refills | Status: AC

## 2021-05-21 NOTE — TOC Initial Note (Addendum)
Transition of Care [facility]) - Initial/Assessment Note    Patient Details  Name: Dean Harris MRN: [id number] Date of Birth: 10-13-1940  Transition of Care [facility]) CM/SW Contact:    [name], LCSW Phone Number: 05/21/2021, 9:18 AM  Clinical Narrative:                 9am-CSW spoke with [name] and patient's nurse at [facility] to make them aware that patient will be discharged there today. They confirmed patient had 2L of oxygen when he was sent to the hospital. CSW will fax Fl2 and DC Summary to f. [phone].   12pm-CSW spoke with patient's two granddaughters at bedside. They are in agreement with discharge plan and request PTAR for transport.   Expected Discharge Plan: Memory Care Barriers to Discharge: No Barriers Identified   Patient Goals and CMS Choice Patient states their goals for this hospitalization and ongoing recovery are:: Return to memory care CMS Medicare.gov Compare Post Acute Care list provided to:: Patient Represenative (must comment) Choice offered to / list presented to : Adult Children  Expected Discharge Plan and Services Expected Discharge Plan: Memory Care In-house Referral: Clinical Social Work   Post Acute Care Choice: Resumption of Svcs/PTA Provider Living arrangements for the past 2 months: Assisted Living Facility                                      Prior Living Arrangements/Services Living arrangements for the past 2 months: Assisted Living Facility Lives with:: Facility Resident Patient language and need for interpreter reviewed:: Yes Do you feel safe going back to the place where you live?: Yes      Need for Family Participation in Patient Care: Yes (Comment) Care giver support system in place?: Yes (comment) Current home services: Hospice Criminal Activity/Legal Involvement Pertinent to Current Situation/Hospitalization: No - Comment as needed  Activities of Daily Living Home Assistive Devices/Equipment: None ADL  Screening (condition at time of admission) Patient's cognitive ability adequate to safely complete daily activities?: No Patient able to express need for assistance with ADLs?: No Does the patient have difficulty dressing or bathing?: Yes Independently performs ADLs?: No Communication: Needs assistance Is this a change from baseline?: Pre-admission baseline Dressing (OT): Dependent Is this a change from baseline?: Pre-admission baseline Grooming: Dependent Is this a change from baseline?: Pre-admission baseline Feeding: Dependent Is this a change from baseline?: Pre-admission baseline Bathing: Dependent Is this a change from baseline?: Pre-admission baseline Toileting: Dependent In/Out Bed: Dependent Is this a change from baseline?: Pre-admission baseline Walks in Home: Dependent Is this a change from baseline?: Pre-admission baseline Does the patient have difficulty walking or climbing stairs?: Yes Weakness of Legs: Both Weakness of Arms/Hands: Both  Permission Sought/Granted Permission sought to share information with : Facility Medical sales representative, Family Supports Permission granted to share information with : No  Share Information with NAME: [name]  Permission granted to share info w AGENCY: [employer]  Permission granted to share info w Relationship: Granddaughter  Permission granted to share info w Contact Information: [phone]  Emotional Assessment Appearance:: Appears stated age Attitude/Demeanor/Rapport: Unable to Assess Affect (typically observed): Unable to Assess Orientation: :  (does not follow commands) Alcohol / Substance Use: Not Applicable Psych Involvement: No (comment)  Admission diagnosis:  Hyperglycemia [R73.9] Elevated troponin [R77.8] Sepsis (HCC) [A41.9] Multifocal pneumonia [J18.9] Alzheimer's dementia with behavioral disturbance, unspecified timing of dementia onset (HCC) [G30.9, F02.81]  Sepsis with acute renal failure without septic  shock, due to unspecified organism, unspecified acute renal failure type (HCC) [A41.9, R65.20, N17.9] Patient Active Problem List   Diagnosis Date Noted   Severe sepsis (HCC) 05/16/2021   Sepsis (HCC) 05/16/2021   Hyperosmolar hyperglycemic state (HHS) (HCC) 08/02/2020   Pressure injury of skin 08/01/2020   Type 2 diabetes mellitus with hyperosmolar hyperglycemic state (HHS) (HCC) 07/31/2020   Hypertension associated with diabetes (HCC) 07/31/2020   Hypernatremia 07/31/2020   Hyperkalemia 07/31/2020   Acute subdural hematoma (HCC) 05/10/2020   Hyperlipidemia with target LDL less than 70    Glaucoma    Grade I diastolic dysfunction    AKI (acute kidney injury) (HCC)    Dementia with behavioral disturbance (HCC)    AMS (altered mental status) 01/11/2020   CAP (community acquired pneumonia) 01/10/2020   Weight loss, non-intentional 05/20/2019   Depression, recurrent (HCC) 12/01/2017   Memory disorder 12/01/2017   Carotid artery plaque, bilateral; s/p Bilateral CEA 07/24/2016   Syncope 01/28/2016   Moderate aortic stenosis 01/28/2016   Chronic stable angina (HCC) 01/28/2016   BMI 30.0-30.9,adult 07/09/2015   Thrombocytopenia (HCC) 04/29/2014   Essential hypertension, benign 03/03/2014   Coronary artery disease, non-occlusive    Sleep apnea 11/30/2010   Gout 11/30/2010   Hyperlipidemia associated with type 2 diabetes mellitus (HCC) 11/30/2010   Rosacea 11/30/2010   COPD (chronic obstructive pulmonary disease) (HCC) 11/30/2010   ED (erectile dysfunction) 11/30/2010   Peripheral neuropathy 11/30/2010   Type 2 diabetes mellitus (HCC) 09/16/1999   PCP:  Bennie Pierini, FNP Pharmacy:  No Pharmacies Listed    Social Determinants of Health (SDOH) Interventions    Readmission Risk Interventions No flowsheet data found.

## 2021-05-21 NOTE — Progress Notes (Signed)
  Speech Language Pathology Treatment: Dysphagia  Patient Details Name: Dean Harris MRN: [id number] DOB: 04/13/1941 Today's Date: 05/21/2021 Time: 6160-7371 SLP Time Calculation (min) (ACUTE ONLY): 13 min  Assessment / Plan / Recommendation Clinical Impression  Pt progressing with diet tolerance with purees and honey thick liquids. He continues to require total assist feeding and cueing to seal lip to cup edge; occasionally bites down on spoon and is unable to coordinate straw sips. Pt has immediate signs of aspiration with one trial of thin liquids; pt slow to achieve oral control of bolus with immediate cough. Pt is much more successful with honey thick liquids given via cup or spoon when fully upright and alert. Granddaughter at bedside reports improvement in quantity of intake and good tolerance of textures with careful hand feeding. Pt needs bites followed with sips and verbal reminders to reduce oral holding. His medications have been given crushed in puree successfully. If needed, small whole pills in pudding or puree could be attempted if not crushable.    HPI HPI: Pt is a 80 year old male from [facility] who presented to the ED  secondary to worsening cough, congestion and decreased responsiveness his family was notified. Pt was reportedly diagnosed with COVID 3 weeks ago; mild cough but otherwise was relatively asymptomatic then. CT chest on admission: Multi lobar pneumonia involving the right upper and right lower lobes. The right lower lobe is essentially completely consolidated.  New wall thickening and gas within the known large right upper pole renal cysts concerning for superinfection and abscess formation. Dx toxic encephalopathy. PMH: advanced dementia, nonverbal with behavioral disturbance, bedbound/wheelchair-bound, history of diabetes, hypertension, history of CAD, followed by [facility] for the last 4 to 5 months, but not on comfort  measures. BSE 05/10/20: additional time needed for mastication and oral clearance, but no s/sx of aspiration noted; dysphagia 3/thin liquids.      SLP Plan  Continue with current plan of care       Recommendations  Diet recommendations: Dysphagia 1 (puree);Honey-thick liquid Liquids provided via: Teaspoon;Cup Medication Administration: Crushed with puree Supervision: Full supervision/cueing for compensatory strategies;Trained caregiver to feed patient Compensations: Slow rate;Small sips/bites                Oral Care Recommendations: Oral care QID;Staff/trained caregiver to provide oral care Follow up Recommendations: Skilled Nursing facility SLP Visit Diagnosis: Dysphagia, unspecified (R13.10) Plan: Continue with current plan of care       GO               Harlon Ditty, MA CCC-SLP  Acute Rehabilitation Services Office [phone]  Claudine Mouton 05/21/2021, 9:52 AM

## 2021-05-21 NOTE — NC FL2 (Signed)
[place] MEDICAID FL2 LEVEL OF CARE SCREENING TOOL     IDENTIFICATION  Patient Name: Dean Harris Birthdate: 10/31/1940 Sex: male Admission Date (Current Location): 05/16/2021  [facility] and [place] Number:  Producer, television/film/video and Address:  [facility] [place], [address] [place], [place] [address]      Provider Number: [id number]  Attending Physician Name and Address:  Leroy Sea, MD  Relative Name and Phone Number:       Current Level of Care: Hospital Recommended Level of Care: Memory Care Prior Approval Number:    Date Approved/Denied:   PASRR Number:    Discharge Plan: Other (Comment) (Memory care)    Current Diagnoses: Patient Active Problem List   Diagnosis Date Noted   Dementia with behavioral disturbance (HCC)    Severe sepsis (HCC) 05/16/2021   Sepsis (HCC) 05/16/2021   Hyperosmolar hyperglycemic state (HHS) (HCC) 08/02/2020   Pressure injury of skin 08/01/2020   Type 2 diabetes mellitus with hyperosmolar hyperglycemic state (HHS) (HCC) 07/31/2020   Hypertension associated with diabetes (HCC) 07/31/2020   Hypernatremia 07/31/2020   Hyperkalemia 07/31/2020   Acute subdural hematoma (HCC) 05/10/2020   Hyperlipidemia with target LDL less than 70    Glaucoma    Grade I diastolic dysfunction    AKI (acute kidney injury) (HCC)    AMS (altered mental status) 01/11/2020   CAP (community acquired pneumonia) 01/10/2020   Weight loss, non-intentional 05/20/2019   Depression, recurrent (HCC) 12/01/2017   Memory disorder 12/01/2017   Carotid artery plaque, bilateral; s/p Bilateral CEA 07/24/2016   Syncope 01/28/2016   Moderate aortic stenosis 01/28/2016   Chronic stable angina (HCC) 01/28/2016   BMI 30.0-30.9,adult 07/09/2015   Thrombocytopenia (HCC) 04/29/2014   Essential hypertension, benign 03/03/2014   Coronary artery disease, non-occlusive    Sleep apnea 11/30/2010   Gout 11/30/2010   Hyperlipidemia associated with type  2 diabetes mellitus (HCC) 11/30/2010   Rosacea 11/30/2010   COPD (chronic obstructive pulmonary disease) (HCC) 11/30/2010   ED (erectile dysfunction) 11/30/2010   Peripheral neuropathy 11/30/2010   Type 2 diabetes mellitus (HCC) 09/16/1999    Orientation RESPIRATION BLADDER Height & Weight      (unable to follow commands)  O2 (3L Nasal cannula) Incontinent Weight: 143 lb 15.4 oz (65.3 kg) Height:     BEHAVIORAL SYMPTOMS/MOOD NEUROLOGICAL BOWEL NUTRITION STATUS      Continent Diet (pureed; honey thick liquids)  AMBULATORY STATUS COMMUNICATION OF NEEDS Skin   Extensive Assist Verbally PU Stage and Appropriate Care (unstageable on heel)                       Personal Care Assistance Level of Assistance  Bathing, Feeding, Dressing Bathing Assistance: Maximum assistance Feeding assistance: Maximum assistance Dressing Assistance: Maximum assistance     Functional Limitations Info             SPECIAL CARE FACTORS FREQUENCY                       Contractures Contractures Info: Not present    Additional Factors Info  Code Status, Allergies, Insulin Sliding Scale Code Status Info: DNR Allergies Info: Dust Mite Extract, Mold Extract (Trichophyton), Pollen Extract, Tetracyclines & Related   Insulin Sliding Scale Info: See DC Summary       Current Medications (05/21/2021):    Discharge Medications: TAKE these medications     acetaminophen 500 MG tablet Commonly known as: TYLENOL  Take 500 mg by mouth every 6 (six) hours as needed (for fever 99.5-101 F, minor headaches or discomfort- report any fever >101 F, severe headache or discomfort).    cephALEXin 500 MG capsule Commonly known as: KEFLEX Take 1 capsule (500 mg total) by mouth 3 (three) times daily for 7 days. open the capsule - sprinkle in applesauce    Dermacloud Oint Apply 1 application topically See admin instructions. Apply topically every 3 hours- with every incontinent change    donepezil 10 MG  tablet Commonly known as: ARICEPT TAKE 1/2 (ONE-HALF) TABLET BY MOUTH AT BEDTIME (Needs to be seen before next refill) What changed:  how much to take how to take this when to take this additional instructions    guaifenesin 100 MG/5ML syrup Commonly known as: ROBITUSSIN Take 200 mg by mouth every 6 (six) hours as needed for cough.    haloperidol 0.5 MG tablet Commonly known as: HALDOL Take 0.5 mg by mouth 2 (two) times daily as needed for agitation (agitation).    insulin glargine 100 UNIT/ML injection Commonly known as: LANTUS Inject 0.1 mLs (10 Units total) into the skin daily. What changed:  how much to take additional instructions    INSULIN SYRINGE .3CC/31GX5/16" 31G X 5/16" 0.3 ML Misc 1 Act by Does not apply route daily.    isosorbide dinitrate 30 MG tablet Commonly known as: ISORDIL Take 30 mg by mouth daily.    lisinopril 2.5 MG tablet Commonly known as: ZESTRIL Take 2.5 mg by mouth daily.    loperamide 2 MG capsule Commonly known as: IMODIUM Take 2 mg by mouth as needed (with each loose stool/diarrhea- Max of 8 doses/24 hours- REPORT ANY BLOOD).    memantine 10 MG tablet Commonly known as: NAMENDA Take 1 tablet (10 mg total) by mouth 2 (two) times daily.    Milk of Magnesia 400 MG/5ML suspension Generic drug: magnesium hydroxide Take 30 mLs by mouth at bedtime as needed for mild constipation.    nitroGLYCERIN 0.4 MG SL tablet Commonly known as: NITROSTAT Use 1 tabket under tongue at onset and may repeat every 5 minutes x2 What changed:  how much to take how to take this when to take this reasons to take this additional instructions    Remedy Calazime 0.4-20.5 % Pste Generic drug: Menthol-Zinc Oxide Apply 1 application topically See admin instructions. Apply to the sacrum 3 times a day AND three times a day as needed after incontinence    senna-docusate 8.6-50 MG tablet Commonly known as: Senokot-S Take 1 tablet by mouth 2 (two) times daily.     sertraline 100 MG tablet Commonly known as: ZOLOFT Take 2 tablets (200 mg total) by mouth in the morning.    sulfamethoxazole-trimethoprim 800-160 MG tablet Commonly known as: BACTRIM DS Take 1 tablet by mouth 2 (two) times daily. Crush the pills in applesauce    Travoprost (BAK Free) 0.004 % Soln ophthalmic solution Commonly known as: TRAVATAN Place 1 drop into both eyes at bedtime.    Relevant Imaging Results:  Relevant Lab Results:   Additional Information SS#: [id number]. Active with [facility].  [name], LCSW

## 2021-05-21 NOTE — TOC Transition Note (Signed)
Transition of Care [facility]) - CM/SW Discharge Note   Patient Details  Name: Dean Harris MRN: [id number] Date of Birth: [birth year]  Transition [facility]) CM/SW Contact:  [name], LCSW Phone Number: 05/21/2021, 1:03 PM   Clinical Narrative:    Patient will DC to: [facility] Anticipated DC date: 05/21/21 Family notified: Granddaughters at bedside Transport by: [facility]   Per MD patient ready for DC to [facility]. RN to call report prior to discharge ( [phone]). RN, patient, patient's family, and facility notified of DC. Discharge Summary and FL2 sent to facility. DC packet on chart. Ambulance transport requested for patient.   CSW will sign off for now as social work intervention is no longer needed. Please consult [place] again if new needs arise.     Final next level of care: Memory Care Barriers to Discharge: No Barriers Identified   Patient Goals and CMS Choice Patient states their goals for this hospitalization and ongoing recovery are:: Return to memory care CMS Medicare.gov Compare Post Acute Care list provided to:: Patient Represenative (must comment) Choice offered to / list presented to : Adult Children  Discharge Placement                Patient to be transferred to facility by: [facility] Name of family member notified: Granddaughters Patient and family notified of of transfer: 05/21/21  Discharge Plan and Services In-house Referral: Clinical Social Work   Post Acute Care Choice: Resumption of Svcs/PTA Provider                               Social Determinants of Health (SDOH) Interventions     Readmission Risk Interventions No flowsheet data found.

## 2021-05-21 NOTE — Discharge Instructions (Addendum)
Check CBGs 3 times a day before meals and before bedtime.    Follow with Primary MD Daphine Deutscher Mary-Margaret, FNP in 7 days   Activity: As tolerated with Full fall precautions use walker/cane & assistance as needed  Disposition ALF  Diet: Dysphagia 1 - Nectar thick liquids with feeding assistance and aspiration precautions - open any capsules and sprinkle in applesauce  Special Instructions: If you have smoked or chewed Tobacco  in the last 2 yrs please stop smoking, stop any regular Alcohol  and or any Recreational drug use.  On your next visit with your primary care physician please Get Medicines reviewed and adjusted.  Please request your Prim.MD to go over all Hospital Tests and Procedure/Radiological results at the follow up, please get all Hospital records sent to your Prim MD by signing hospital release before you go home.  If you experience worsening of your admission symptoms, develop shortness of breath, life threatening emergency, suicidal or homicidal thoughts you must seek medical attention immediately by calling 911 or calling your MD immediately  if symptoms less severe.  You Must read complete instructions/literature along with all the possible adverse reactions/side effects for all the Medicines you take and that have been prescribed to you. Take any new Medicines after you have completely understood and accpet all the possible adverse reactions/side effects.

## 2021-05-21 NOTE — Discharge Summary (Addendum)
Dean Harris ZOX:[id number] DOB: 25-Jan-1941 DOA: 05/16/2021  PCP: Bennie Pierini, FNP  Admit date: 05/16/2021  Discharge date: 05/21/2021  Admitted From: ALF   Disposition:  ALF - Hospice   Recommendations for Outpatient Follow-up:   Follow up with PCP in 1-2 weeks  PCP Please obtain BMP/CBC, 2 view CXR in 1week,  (see Discharge instructions)   PCP Please follow up on the following pending results:    Home Health: None   Equipment/Devices: None  Consultations: None - Pall care refused by family Discharge Condition: Guarded   CODE STATUS: DNR   Diet Recommendation: Dysphagia 1 diet with honey thick liquids.  Full feeding assistance and aspiration precautions.     Chief Complaint  Patient presents with   Altered Mental Status   Fever     Brief history of present illness from the day of admission and additional interim summary    Dean Harris is a 80 year old male from [facility] with advanced dementia, nonverbal with behavioral disturbance, bedbound/wheelchair-bound, history of diabetes, hypertension, history of CAD, followed by [facility] for the last 4 to 5 months, was reportedly diagnosed with COVID 3 weeks ago, he had a mild cough but otherwise was relatively asymptomatic then, over the last couple of days had worsening cough, he subsequently became more was brought hospital where he was diagnosed with sepsis due to pneumonia right kidney abscess and sepsis.                                                                 Hospital Course   Severe sepsis (HCC)- POA - due to RLL PNA + possible R. Kidney Abscess - with Toxic Encephalopathy - he was status Hospice due to advanced dementia and poor quality of life at the ALF, sepsis pathophysiology is better,  was on empiric IV ABX >> PO upon DC, not a surgical candidate, was being followed with hospice at ALF family did not want to talk to palliative care at the hospital.   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  They wish to pursue gentle medical treatment this admission, if declines again at SNF they want to pursue full comfort measures at SNF due to passive admitting eating.  They realized that the aspiration is going to be recurrent and is related to patient's deconditioning and dementia and that underlying problem is not fixable.  Goal of care comfort, gentle medical Rx, no invasive procedures, if declines further full comfort.    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 Fredia Sorrow, CT scan abdomen pelvis done for prognostic purposes, DW granddaughters bedside 05/20/21 - Med Rx only,  not operative candidate, if  significant decline Full Comfort care.   Advanced dementia - Nonverbal with behavioral disturbance - Bed/wheelchair-bound -Followed by hospice, goal of care is comfort, DNR   Recent COVID  - Reportedly diagnosed with COVID first weekend in August, over 3 weeks prior, no acute issues.   H/o CAD - not active at this time    Diabetes Mellitus - on Lantus, continue home Rx, check CBGs q. ACH S.   Discharge diagnosis     Active Problems:   Severe sepsis (HCC)   Sepsis [facility])    Discharge instructions    Discharge Instructions     Discharge instructions   Complete by: As directed    Check CBGs 3 times a day before meals and before bedtime.    Follow with Primary MD Daphine Deutscher Mary-Margaret, FNP in 7 days   Activity: As tolerated with Full fall precautions use walker/cane & assistance as needed  Disposition ALF  Diet: Dysphagia 1 - Nectar thick liquids with feeding assistance and aspiration  precautions - open any capsules and sprinkle in applesauce  Special Instructions: If you have smoked or chewed Tobacco  in the last 2 yrs please stop smoking, stop any regular Alcohol  and or any Recreational drug use.  On your next visit with your primary care physician please Get Medicines reviewed and adjusted.  Please request your Prim.MD to go over all Hospital Tests and Procedure/Radiological results at the follow up, please get all Hospital records sent to your Prim MD by signing hospital release before you go home.  If you experience worsening of your admission symptoms, develop shortness of breath, life threatening emergency, suicidal or homicidal thoughts you must seek medical attention immediately by calling 911 or calling your MD immediately  if symptoms less severe.  You Must read complete instructions/literature along with all the possible adverse reactions/side effects for all the Medicines you take and that have been prescribed to you. Take any new Medicines after you have completely understood and accpet all the possible adverse reactions/side effects.   Discharge wound care:   Complete by: As directed    Order a Prevalon heel lift boot [employer] [place] [phone]) for the left foot.  05/17/21 0930     05/17/21 0929    Wound care  Every shift      Apply iodine from the swabsticks or swab pads from clean utility to the blackened area on the left heel.  Allow to air dry. Then place the foot in a Prevalon heel lift boot.   Increase activity slowly   Complete by: As directed        Discharge Medications   Allergies as of 05/21/2021       Reactions   Dust Mite Extract Other (See Comments), Cough   Sneezing, also   Mold Extract [trichophyton] Other (See Comments)   Sneezing, also   Pollen Extract Other (See Comments)   Sneezing, also   Tetracyclines & Related Other (See Comments)   Generic only (??)        Medication List     TAKE these medications    acetaminophen 500 MG  tablet Commonly known as: TYLENOL Take 500 mg by mouth every 6 (six) hours as needed (for fever 99.5-101 F, minor headaches or discomfort- report any fever >101 F, severe headache or discomfort).   cephALEXin 500 MG capsule Commonly known as: KEFLEX Take 1 capsule (500 mg total) by mouth 3 (three) times daily for 7 days. open the capsule - sprinkle in applesauce  Dermacloud Oint Apply 1 application topically See admin instructions. Apply topically every 3 hours- with every incontinent change   donepezil 10 MG tablet Commonly known as: ARICEPT TAKE 1/2 (ONE-HALF) TABLET BY MOUTH AT BEDTIME (Needs to be seen before next refill) What changed:  how much to take how to take this when to take this additional instructions   guaifenesin 100 MG/5ML syrup Commonly known as: ROBITUSSIN Take 200 mg by mouth every 6 (six) hours as needed for cough.   haloperidol 0.5 MG tablet Commonly known as: HALDOL Take 0.5 mg by mouth 2 (two) times daily as needed for agitation (agitation).   insulin glargine 100 UNIT/ML injection Commonly known as: LANTUS Inject 0.1 mLs (10 Units total) into the skin daily. What changed:  how much to take additional instructions   INSULIN SYRINGE .3CC/31GX5/16" 31G X 5/16" 0.3 ML Misc 1 Act by Does not apply route daily.   isosorbide dinitrate 30 MG tablet Commonly known as: ISORDIL Take 30 mg by mouth daily.   lisinopril 2.5 MG tablet Commonly known as: ZESTRIL Take 2.5 mg by mouth daily.   loperamide 2 MG capsule Commonly known as: IMODIUM Take 2 mg by mouth as needed (with each loose stool/diarrhea- Max of 8 doses/24 hours- REPORT ANY BLOOD).   memantine 10 MG tablet Commonly known as: NAMENDA Take 1 tablet (10 mg total) by mouth 2 (two) times daily.   Milk of Magnesia 400 MG/5ML suspension Generic drug: magnesium hydroxide Take 30 mLs by mouth at bedtime as needed for mild constipation.   nitroGLYCERIN 0.4 MG SL tablet Commonly known as:  NITROSTAT Use 1 tabket under tongue at onset and may repeat every 5 minutes x2 What changed:  how much to take how to take this when to take this reasons to take this additional instructions   Remedy Calazime 0.4-20.5 % Pste Generic drug: Menthol-Zinc Oxide Apply 1 application topically See admin instructions. Apply to the sacrum 3 times a day AND three times a day as needed after incontinence   senna-docusate 8.6-50 MG tablet Commonly known as: Senokot-S Take 1 tablet by mouth 2 (two) times daily.   sertraline 100 MG tablet Commonly known as: ZOLOFT Take 2 tablets (200 mg total) by mouth in the morning.   sulfamethoxazole-trimethoprim 800-160 MG tablet Commonly known as: BACTRIM DS Take 1 tablet by mouth 2 (two) times daily. Crush the pills in applesauce   Travoprost (BAK Free) 0.004 % Soln ophthalmic solution Commonly known as: TRAVATAN Place 1 drop into both eyes at bedtime.               Discharge Care Instructions  (From admission, onward)           Start     Ordered   05/21/21 0000  Discharge wound care:       Comments: Order a Prevalon heel lift boot [employer] [place] [phone]) for the left foot.  05/17/21 0930     05/17/21 0929    Wound care  Every shift      Apply iodine from the swabsticks or swab pads from clean utility to the blackened area on the left heel.  Allow to air dry. Then place the foot in a Prevalon heel lift boot.   05/21/21 1059             Follow-up Information     Bennie Pierini, FNP. Schedule an appointment as soon as possible for a visit in 1 week(s).   Specialty: Family Medicine Contact information: [phone]  [place] [address] [phone]                 Major procedures and Radiology Reports - PLEASE review detailed and final reports thoroughly  -       CT ABDOMEN PELVIS WO CONTRAST  Result Date: 05/19/2021 CLINICAL DATA:  Sepsis EXAM: CT ABDOMEN AND PELVIS WITHOUT CONTRAST TECHNIQUE:  Multidetector CT imaging of the abdomen and pelvis was performed following the standard protocol without IV contrast. COMPARISON:  Renal ultrasound May 18, 2021, CT chest May 16, 2021, and CT abdomen/pelvis January 10, 2020. FINDINGS: Despite efforts by the technologist and patient, motion artifact is present on today's exam and could not be eliminated. This reduces exam sensitivity and specificity. Lower chest: Consolidation in the right lung base with small right pleural effusion and multifocal centrilobular/ tree-in-bud opacities throughout the right lung. Hepatobiliary: Unremarkable noncontrast appearance of the hepatic parenchyma. Layering dependent sludge in the gallbladder. No evidence of a acute gallbladder inflammation. No biliary ductal dilation. Pancreas: Pancreatic atrophy and multifocal calcifications, sequela of chronic pancreatitis. Spleen: Unremarkable. Adrenals/Urinary Tract: Bilateral adrenal glands are unremarkable. Left kidney is without hydronephrosis. Multifocal tiny calcifications within the left renal hila likely reflecting combination nephrolithiasis or vascular calcifications. Duplicated right renal collecting system. 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 The upper pole moiety demonstrates urothelial wall thickening with gas throughout the collecting system, as seen on recent chest CT. Gas in the urinary bladder. Stomach/Bowel: Small hiatal hernia otherwise the stomach is unremarkable for degree of distension. No pathologic dilation of small or large bowel. No evidence of acute bowel inflammation. Vascular/Lymphatic: Aortic atherosclerosis. No pathologically enlarged abdominal or pelvic lymph nodes. Reproductive: Ectopic insertion of the right duplicated system on the prostate. Other: No walled off fluid collection.  No pneumoperitoneum. Musculoskeletal:  Multilevel degenerative changes spine. No acute osseous abnormality. IMPRESSION: 1. 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 The upper pole moiety demonstrates urothelial wall thickening with gas throughout the collecting system, as seen on recent chest CT. Findings reflecting emphysematous pyelitis. 2. Consolidation in the right lung base with small right pleural effusion and multifocal centrilobular/ tree-in-bud opacities throughout the right lung, similar prior chest CT, reflecting multifocal pneumonia. 3. Chronic pancreatitis. 4.  Aortic Atherosclerosis (ICD10-I70.0). These results will be called to the ordering clinician or representative by the Radiologist Assistant, and communication documented in the PACS or [employer]. Electronically Signed   By: Maudry Mayhew M.D.   On: 05/19/2021 15:30   CT HEAD WO CONTRAST ( )  Result Date: 05/16/2021 CLINICAL DATA:  Delirium EXAM: CT HEAD WITHOUT CONTRAST TECHNIQUE: Contiguous axial images were obtained from the base of the skull through the vertex without intravenous contrast. COMPARISON:  CT head 01/22/2021, brain MRI 01/11/2020 FINDINGS: Brain: There is no evidence of acute intracranial hemorrhage, extra-axial fluid collection, or infarct. There is moderate to severe global parenchymal volume loss with ex vacuo dilatation of the ventricular system, not significantly changed. Hypodensity in the subcortical and periventricular white matter is also not significantly changed, likely reflecting sequela of chronic white matter microangiopathy. There is no mass lesion. There is no midline shift. Vascular: There is calcification of the bilateral cavernous ICAs. Skull: Normal. Negative for fracture or focal lesion. Sinuses/Orbits: The paranasal sinuses are clear.  The globes and orbits are unremarkable. Other: There  is a left mastoid effusion, unchanged. IMPRESSION: 1. No acute intracranial pathology. 2. Unchanged global parenchymal volume loss and chronic white matter microangiopathy. 3. Unchanged left mastoid effusion. Electronically Signed   By: Lesia HausenPeter  Noone M.D.   On: 05/16/2021 15:04   CT Angio Chest PE W/Cm &/Or Wo Cm  Result Date: 05/16/2021 CLINICAL DATA:  PE suspected, high probability. EXAM: CT ANGIOGRAPHY CHEST WITH CONTRAST TECHNIQUE: Multidetector CT imaging of the chest was performed using the standard protocol during bolus administration of intravenous contrast. Multiplanar CT image reconstructions and MIPs were obtained to evaluate the vascular anatomy. CONTRAST:  65mL OMNIPAQUE IOHEXOL 350 MG/ML SOLN COMPARISON:  CT abdomen/pelvis 01/10/2020 FINDINGS: Cardiovascular: Satisfactory opacification of the pulmonary arteries to the segmental level. No evidence of pulmonary embolism. Normal heart size. No pericardial effusion. Atherosclerotic calcifications present throughout the thoracic aorta, branch arteries and coronary arteries. Calcifications also present involving the aortic valve. Mediastinum/Nodes: Calcified hilar lymph nodes. No mediastinal mass. Unremarkable thoracic esophagus. Lungs/Pleura: New gas within the previously identified large right upper pole renal cyst. Additionally, the margin appears slightly thickened. Calcified granuloma in the periphery of the lingula and within the medial left lower lobe. Extensive bronchial wall thickening throughout the right lung. Upper Abdomen: New gas and wall thickening involving the known large right upper pole renal cyst. Punctate calcifications throughout the spleen consistent with sequelae of granulomatous disease. Musculoskeletal: No acute fracture or malalignment. 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 Review of the MIP images confirms the above findings. IMPRESSION: 1. Multi lobar pneumonia  involving the right upper and right lower lobes. The right lower lobe is essentially completely consolidated. 2. New wall thickening and gas within the known large right upper pole renal cysts concerning for superinfection and abscess formation. This may be secondary complication in the setting of multilobar pneumonia and bacteremia. 3. No evidence of acute pulmonary embolus. 4. Aortic and coronary artery atherosclerotic calcifications. 5. Sequelae of old granulomatous disease with bilateral calcified hilar lymph nodes and punctate calcifications throughout the spleen. Aortic Atherosclerosis (ICD10-I70.0). Electronically Signed   By: Malachy MoanHeath  McCullough M.D.   On: 05/16/2021 15:52   US RENAL  Result Date: 05/18/2021 CLINICAL DATA:  Acute kidney injury, sepsis and possible right renal abscess. EXAM: RENAL / URINARY TRACT ULTRASOUND COMPLETE COMPARISON:  CTA of the chest on 05/16/2021 FINDINGS: Right Kidney: Renal measurements: 10.1 x 5.5 x 5.6 cm = volume: 163 mL. Visualized renal collecting system of the right kidney demonstrates at least mild hydronephrosis. There is an elongated complex fluid collection abutting and compressing the kidney measuring roughly 18.5 x 8.3 x 9.7 cm. This likely corresponds to the partially visualized fluid collection above the kidney on the CTA of the chest which also contained some small locules of gas. This could represent a renal abscess/infected renal cyst. There may also be some degree of chronic UPJ obstruction and part of the appearance may relate to a dilated renal pelvis. Left Kidney: Renal measurements: 9.8 x 4.8 x 5.4 cm = volume: 130 mL. Echogenicity within normal limits. No mass or hydronephrosis visualized. Bladder: Appears normal for degree of bladder distention. Other: None. IMPRESSION: Large elongated complex fluid collection that is associated with the right kidney and may be causing some degree of compressive hydronephrosis. This may represent an infected renal cyst.  There also may be some component of chronic obstruction of the collecting system. Anatomic delineation is difficult by ultrasound and CT  of the abdomen and pelvis is recommended. If IV contrast could not be administered due to acute kidney injury, the study can be performed without contrast. Electronically Signed   By: Irish Lack M.D.   On: 05/18/2021 15:52   DG Chest Port 1 View  Result Date: 05/16/2021 CLINICAL DATA:  Questionable sepsis - evaluate for abnormality EXAM: PORTABLE CHEST 1 VIEW COMPARISON:  Radiograph 07/31/2020 FINDINGS: The patient is rotated towards the right. Unchanged cardiomediastinal silhouette. There is new opacification throughout the right lung, worst in the mid to lower lung. The left lung appears clear. There is no visible pneumothorax. There is no acute osseous abnormality. IMPRESSION: Opacification throughout the right hemithorax, worst in the right mid to lower lung, could represent pleural effusion with mid to lower lung airspace disease. Electronically Signed   By: Caprice Renshaw M.D.   On: 05/16/2021 13:21      Today   Subjective    Dean Harris today is unable to answer any questions due to advanced dementia.   Objective   Blood pressure (!) 141/92, pulse 74, temperature 99.7 F (37.6 C), temperature source Axillary, resp. rate 16, weight 65.3 kg, SpO2 94 %.   Intake/Output Summary (Last 24 hours) at 05/21/2021 1059 Last data filed at 05/21/2021 0925 Gross per 24 hour  Intake 190 ml  Output 1150 ml  Net -960 ml    Exam  Awake but unable to answer questions or follow commands due to underlying advanced dementia - baseline, moves all 4 extremities by himself [place].AT,PERRAL Supple Neck,No JVD, No cervical lymphadenopathy appriciated.  Symmetrical Chest wall movement, Good air movement bilaterally, CTAB RRR,No Gallops,Rubs or new Murmurs, No Parasternal Heave +ve B.Sounds, Abd Soft, Non tender, No organomegaly appriciated, No rebound -guarding or  rigidity. No Cyanosis, Clubbing or edema, No new Rash or bruise   Data Review   CBC w Diff:  Lab Results  Component Value Date   WBC 9.1 05/21/2021   HGB 11.0 (L) 05/21/2021   HGB 13.1 09/01/2017   HCT 34.1 (L) 05/21/2021   HCT 34.6 (L) 01/12/2020   PLT 208 05/21/2021   PLT 144 (L) 09/01/2017   LYMPHOPCT 22 05/21/2021   MONOPCT 8 05/21/2021   EOSPCT 3 05/21/2021   BASOPCT 1 05/21/2021    CMP:  Lab Results  Component Value Date   NA 137 05/21/2021   NA 140 09/10/2018   K 3.0 (L) 05/21/2021   CL 108 05/21/2021   CO2 22 05/21/2021   BUN 15 05/21/2021   BUN 20 09/10/2018   CREATININE 1.05 05/21/2021   CREATININE 1.14 02/09/2013   PROT 5.5 (L) 05/21/2021   PROT 6.7 09/10/2018   ALBUMIN 1.9 (L) 05/21/2021   ALBUMIN 4.5 09/10/2018   BILITOT 0.7 05/21/2021   BILITOT 1.0 09/10/2018   ALKPHOS 55 05/21/2021   AST 48 (H) 05/21/2021   ALT 47 (H) 05/21/2021  .   Total Time in preparing paper work, data evaluation and todays exam - 35 minutes  Susa Raring M.D on 05/21/2021 at 10:59 AM  [facility]

## 2021-05-21 NOTE — Care Management Important Message (Signed)
Important Message  Patient Details  Name: Dean Harris MRN: [id number] Date of Birth: [birth year]   Medicare Important Message Given:  Yes     Dorena Bodo 05/21/2021, 3:49 PM

## 2021-05-22 ENCOUNTER — Telehealth: Payer: Self-pay

## 2021-05-22 NOTE — Telephone Encounter (Signed)
Transition Care Management Follow-up Telephone Call Date of discharge and from where: 05/21/2021 from [facility] Diagnosis: Sepsis Pneumonia How have you been since you were released from the hospital? Pt is at [facility]. Spoke with granddaughter, [name] and she is waiting for a call today from Hospice Nurse. Any questions or concerns? No  Items Reviewed: Did the pt receive and understand the discharge instructions provided?  Granddaughter, [name] did verbalize understanding.  Medications obtained and verified? Yes  Other? No  Any new allergies since your discharge? No  Dietary orders reviewed? Yes Do you have support at home? Yes   Home Care and Equipment/Supplies: Were home health services ordered? Pt currently at [facility].  Has the agency set up a time to come to the patient's home? not applicable Were any new equipment or medical supplies ordered?  No What is the name of the medical supply agency? N/A Were you able to get the supplies/equipment? not applicable Do you have any questions related to the use of the equipment or supplies? No  Functional Questionnaire: (I = Independent and D = Dependent) ADLs: D  Bathing/Dressing- D  Meal Prep- D  Eating- D  Maintaining continence- D  Transferring/Ambulation- D  Managing Meds- D  Follow up appointments reviewed:  PCP Hospital f/u appt confirmed? Yes  Scheduled to see Bennie Pierini on 05/28/2021 @ 10:15. Phone visit with granddaughter, Truddie Coco. Specialist Hospital f/u appt confirmed?  N/A   Are transportation arrangements needed? No  If their condition worsens, is the pt aware to call PCP or go to the Emergency Dept.? Yes Was the patient provided with contact information for the PCP's office or ED? Yes Was to pt encouraged to call back with questions or concerns? Yes y

## 2021-05-28 ENCOUNTER — Telehealth: Payer: Medicare PPO | Admitting: Nurse Practitioner

## 2021-06-15 DEATH — deceased

## 2021-07-31 IMAGING — CT CT CERVICAL SPINE W/O CM
4 of 8 series · 12 of 34 positions shown, 13 images · non-contrast
Comparison: CT head and cervical spine dated 05/10/2020.

CLINICAL DATA: Fall from standing with head and neck pain.

EXAM:
CT HEAD WITHOUT CONTRAST
CT CERVICAL SPINE WITHOUT CONTRAST
TECHNIQUE: Multidetector CT imaging of the head and cervical spine was
performed following the standard protocol without intravenous
contrast. Multiplanar CT image reconstructions of the cervical spine
were also generated.

[Series 6: c_spine 2.0 st · axial · 0.39mm/px · z∈[-226,-176]mm · 2 of 75 slices shown, 3 images]
[im 25/75  soft-tissue]
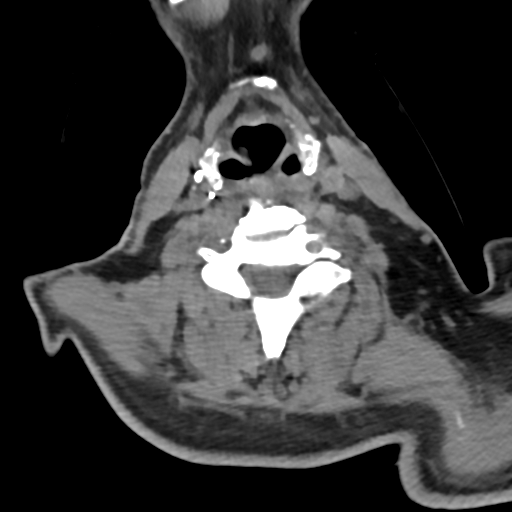
[im 25/75  bone]
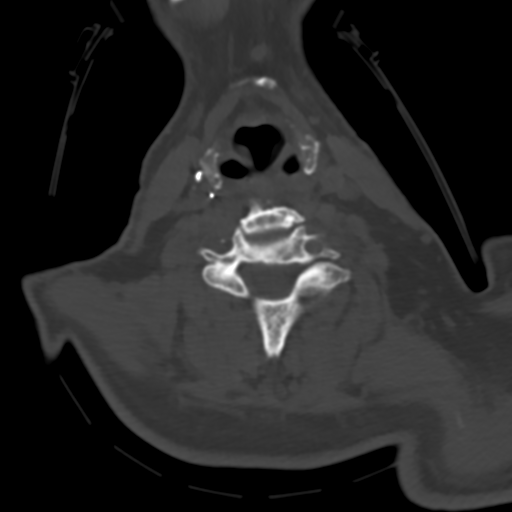
[im 50/75  bone]
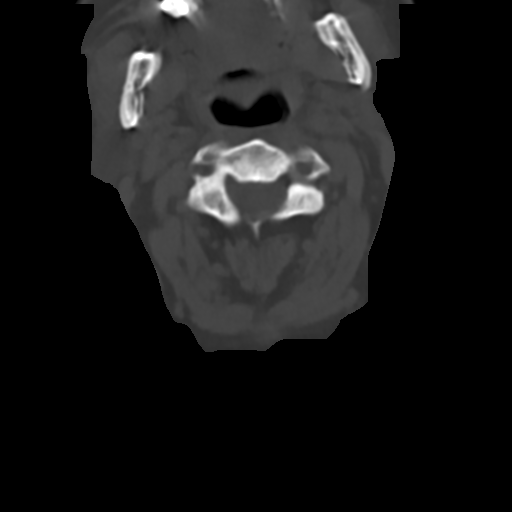

[Series 7: coronal bone · coronal · 0.23mm/px · 2 of 61 slices shown]
[im 5/61  bone]
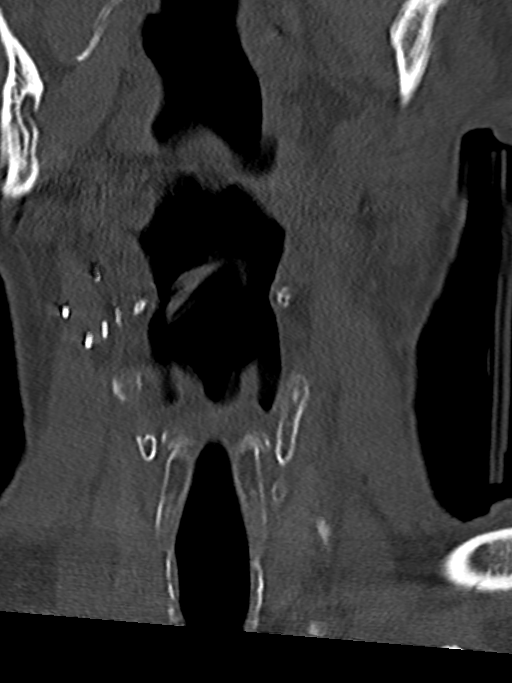
[im 33/61  bone]
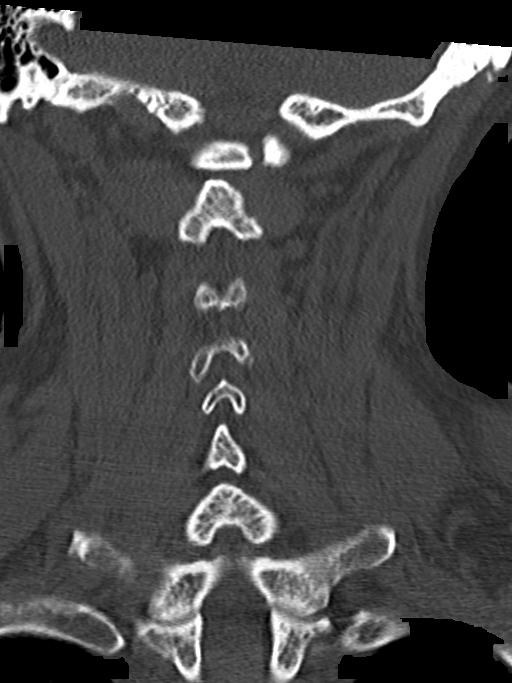

[Series 8: sagittal bone · sagittal · 0.23mm/px · 6 of 61 slices shown]
[im 11/61  bone]
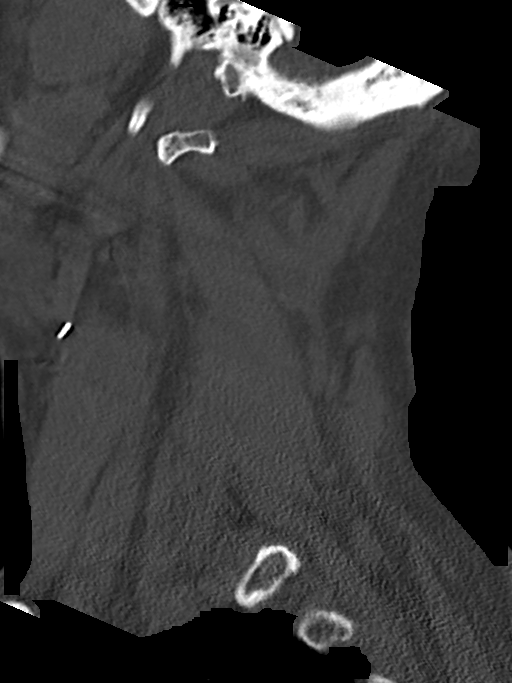
[im 21/61  bone]
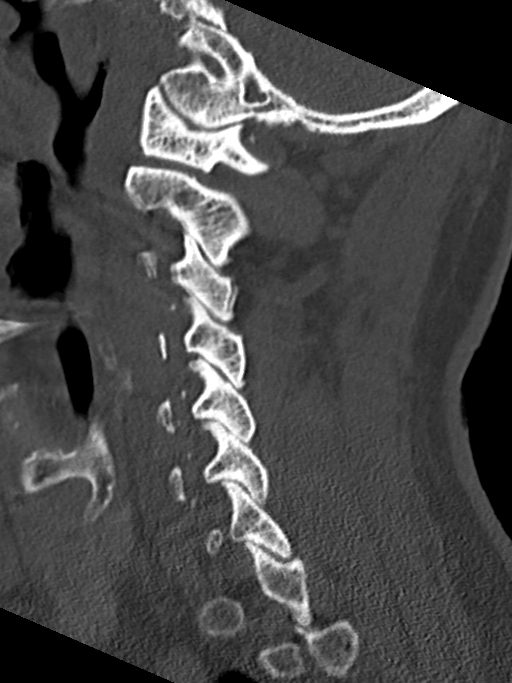
[im 25/61  soft-tissue]
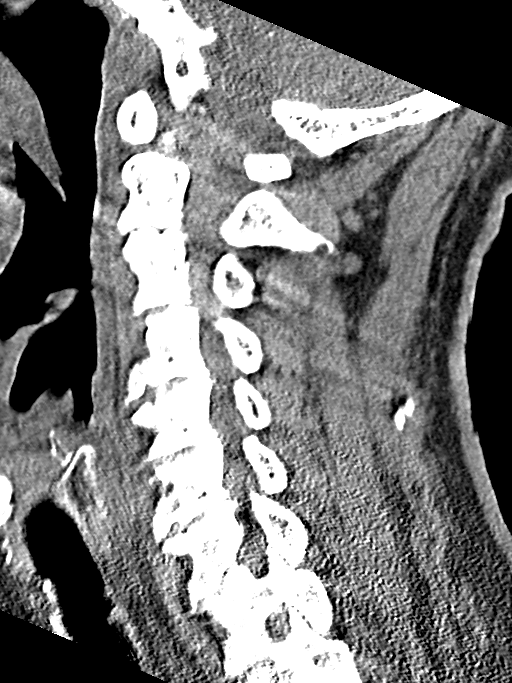
[im 31/61  bone]
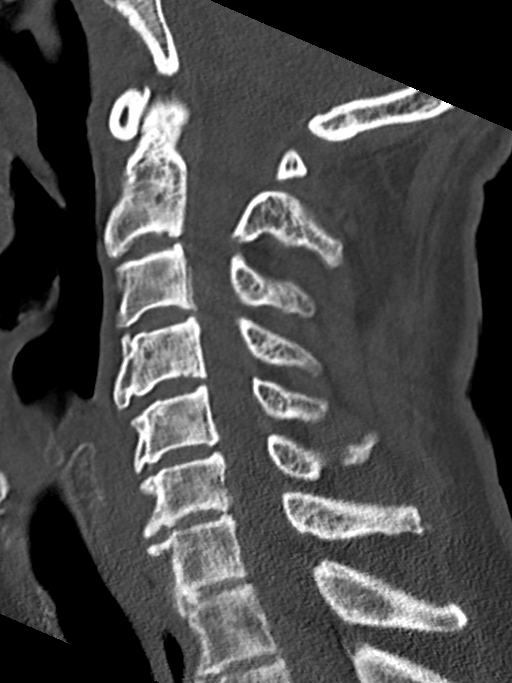
[im 41/61  bone]
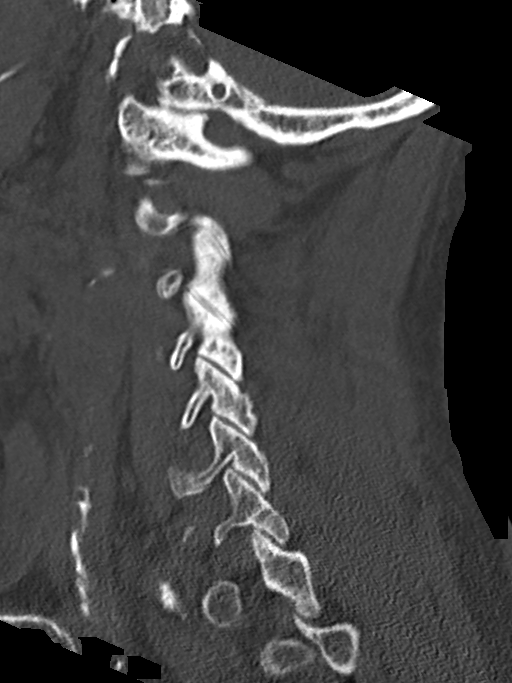
[im 51/61  bone]
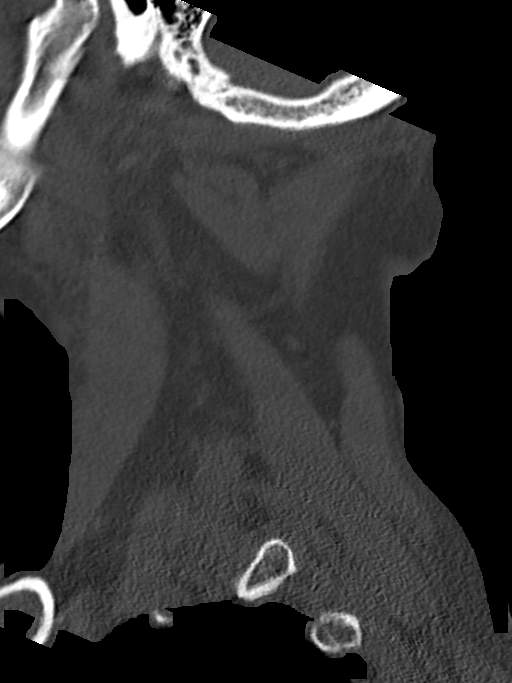

[Series 9: orthogonal axial bone · axial · 0.21mm/px · z∈[-256,-203]mm · 2 of 84 slices shown]
[im 28/84  bone]
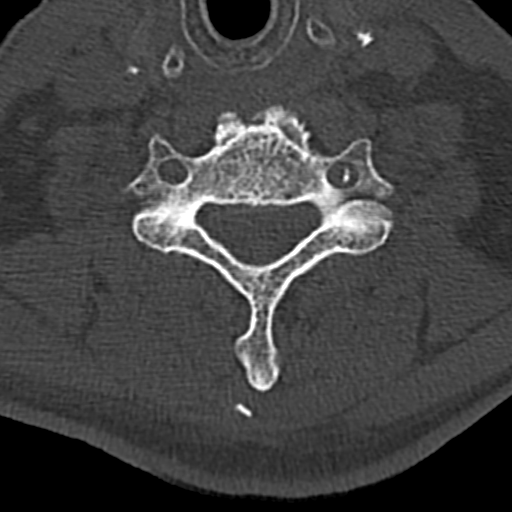
[im 56/84  bone]
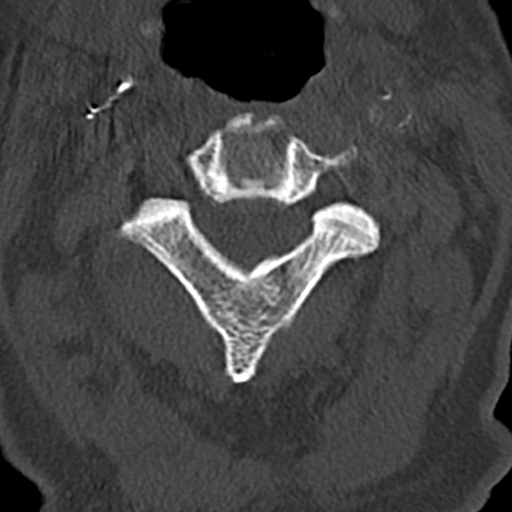

[12 of 34 positions shown; findings below may reference images not displayed]

FINDINGS: CT HEAD FINDINGS

Brain: No evidence of acute infarction, hemorrhage, hydrocephalus,
extra-axial collection or mass lesion/mass effect. There is moderate
to severe cerebral volume loss with associated ex vacuo dilatation.
Periventricular white matter hypoattenuation likely represents
chronic small vessel ischemic disease. The previously seen subdural
hematoma along the right cerebral convexity has since resolved.

Vascular: There are vascular calcifications in the carotid siphons.

Skull: Normal. Negative for fracture or focal lesion.

Sinuses/Orbits: No acute finding.

Other: None.

CT CERVICAL SPINE FINDINGS

Alignment: Normal.

Skull base and vertebrae: No acute fracture. No primary bone lesion
or focal pathologic process.

Soft tissues and spinal canal: No prevertebral fluid or swelling. No
visible canal hematoma.

Disc levels: Moderate multilevel degenerative disc and joint
disease.

Upper chest: Negative.

Other: None.
IMPRESSION: 1. No acute intracranial process.
2. No acute osseous injury in the cervical spine.

## 2021-07-31 IMAGING — CR DG CHEST 2V
2 series · 2 of 2 positions shown · non-contrast
Comparison: 05/09/2020

CLINICAL DATA: Fall

EXAM:
CHEST - 2 VIEW

[chest lat]
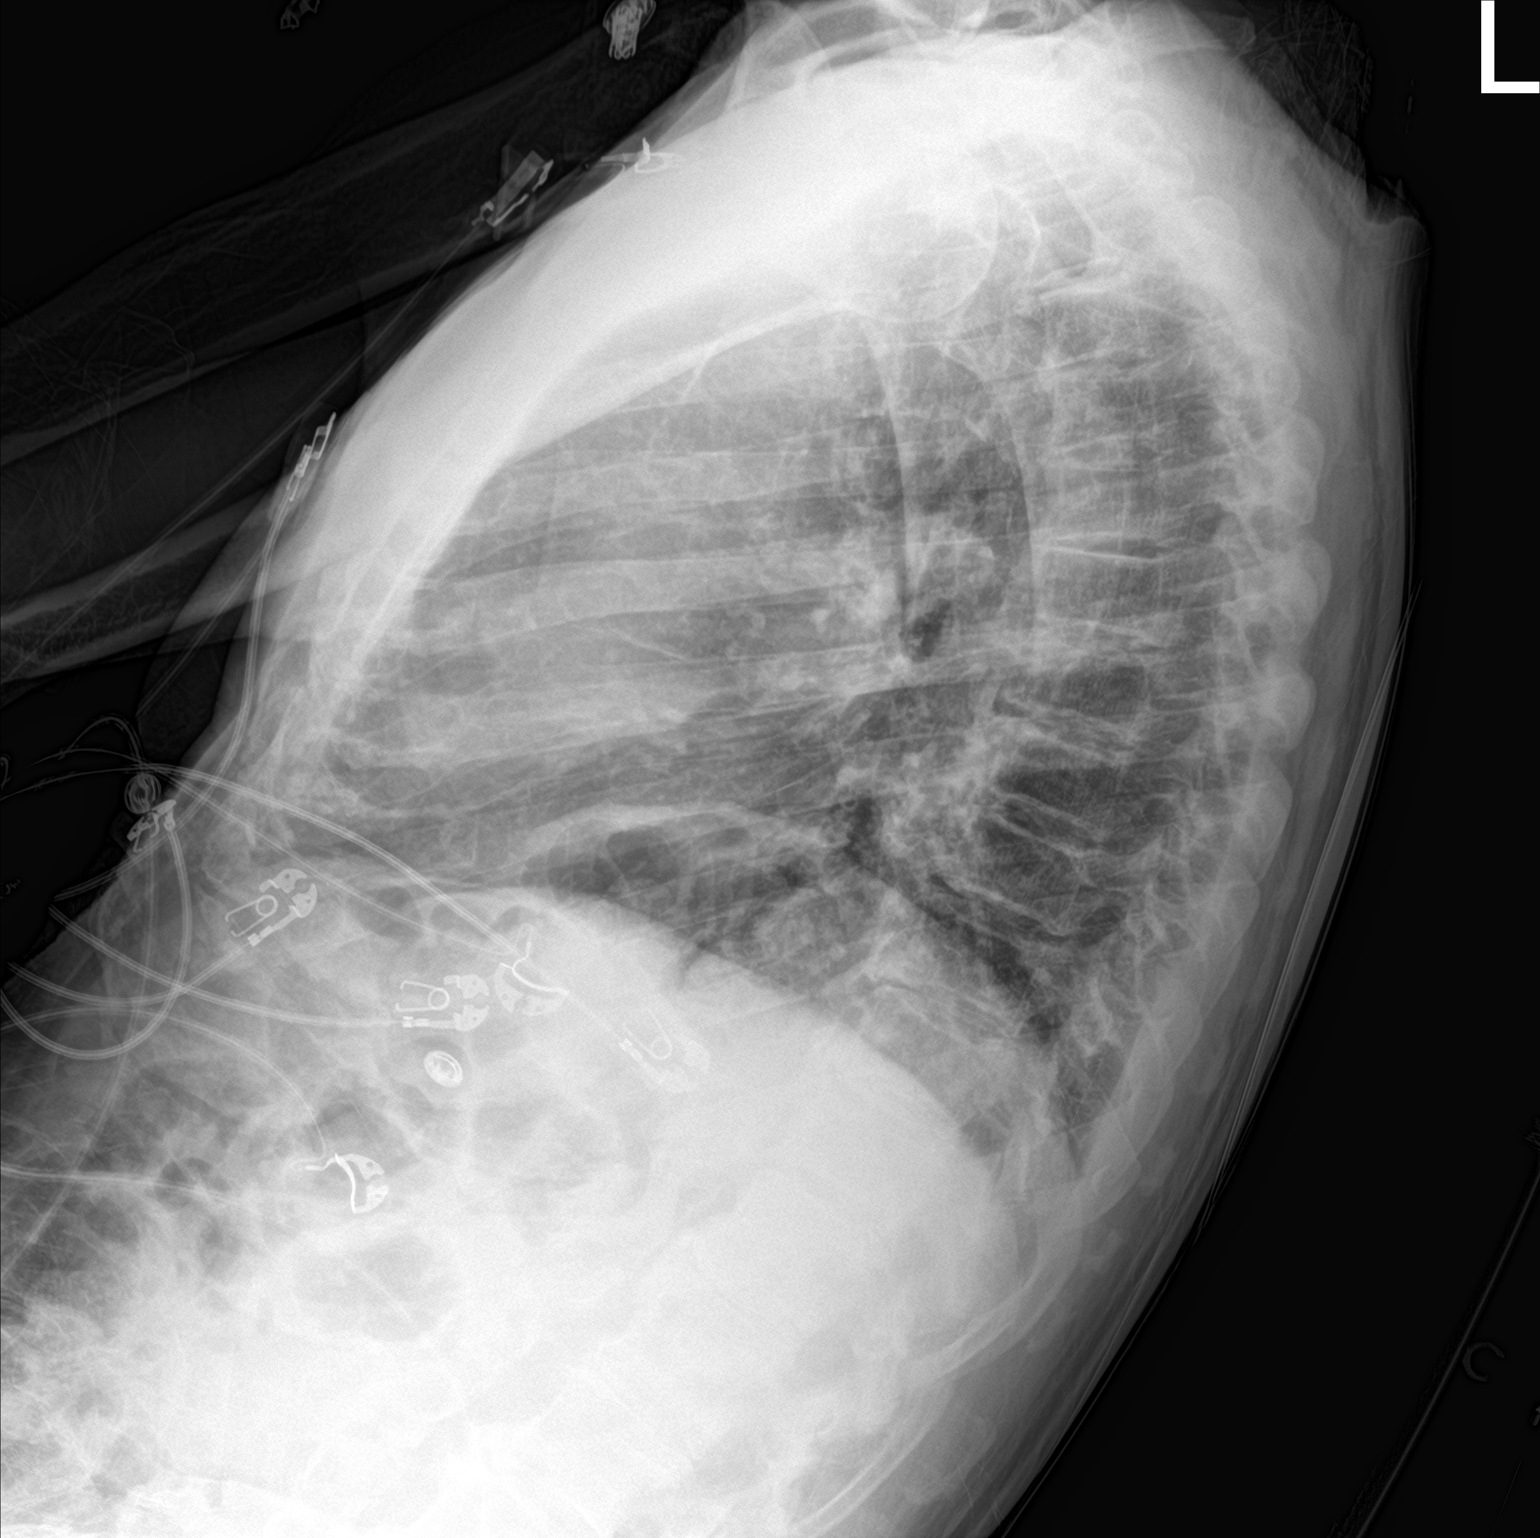

[chest ap]
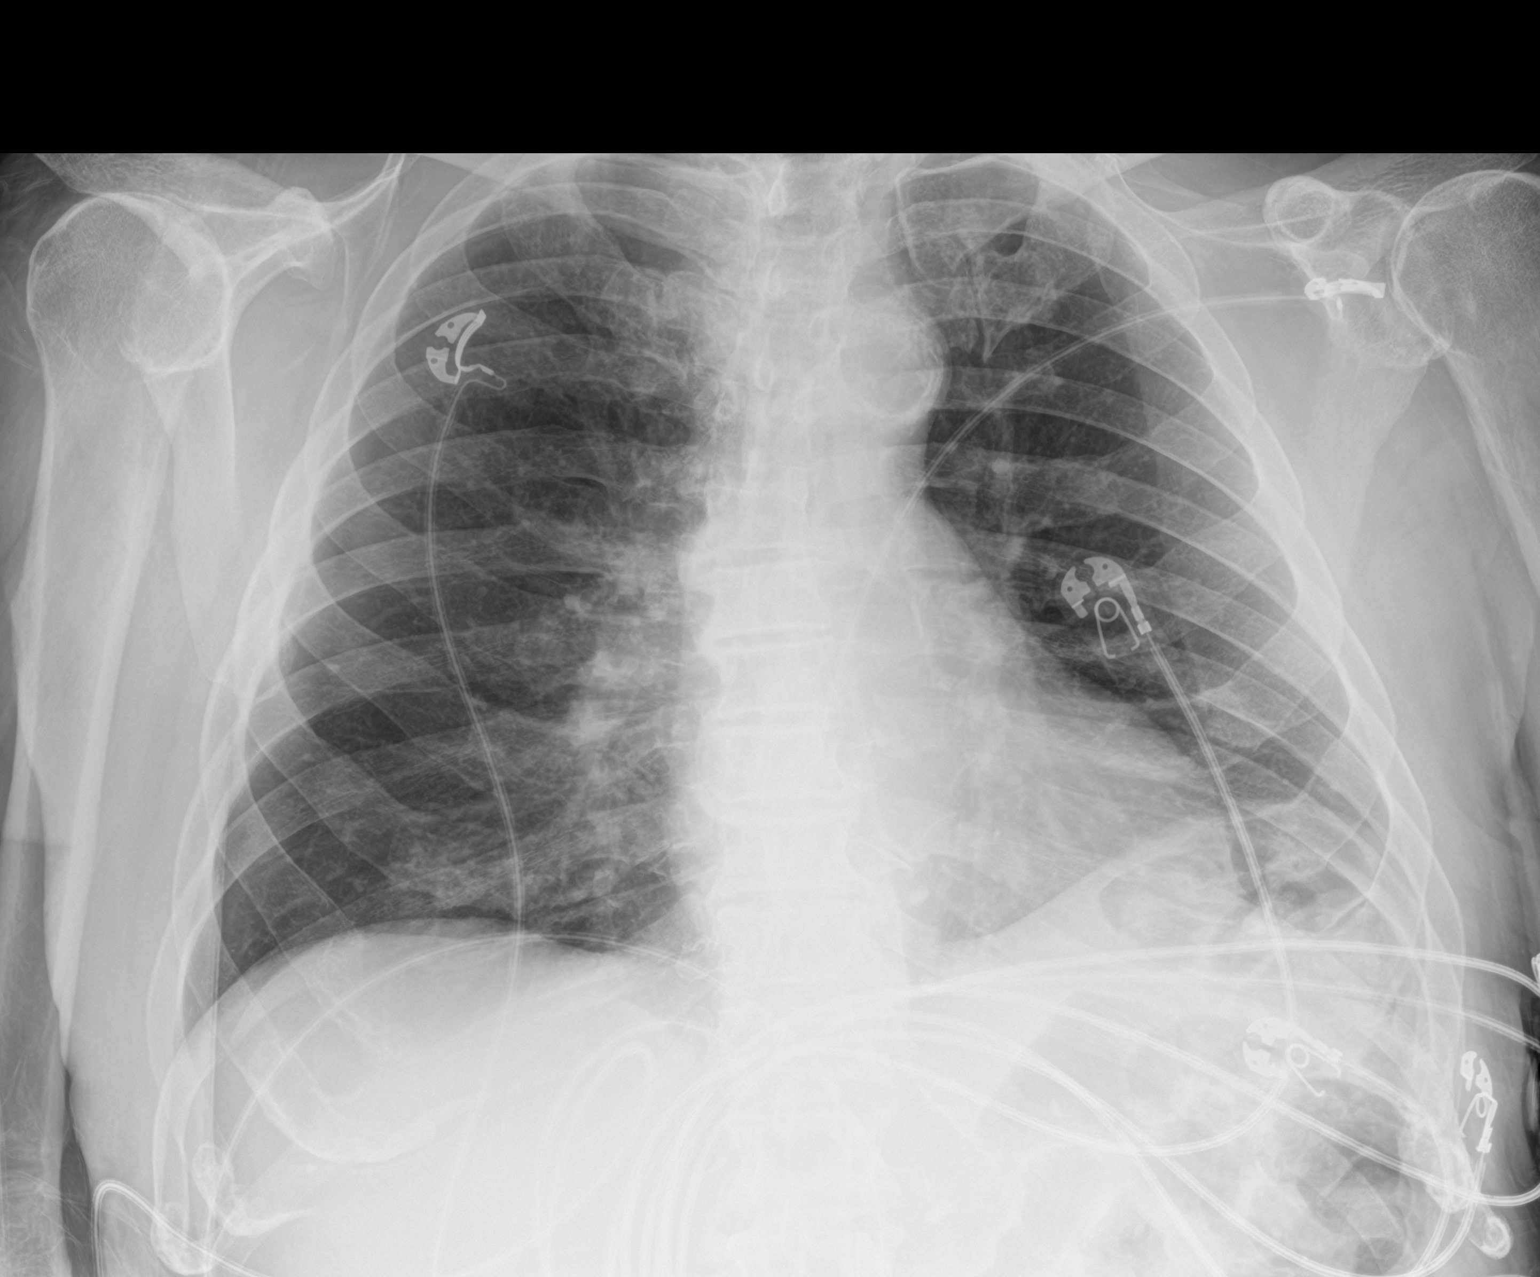

[2 of 2 positions shown; findings below may reference images not displayed]

FINDINGS: Stable mild interstitial prominence. No effusion pneumothorax.
Stable cardiomediastinal contours with normal heart size.
IMPRESSION: No acute process in the chest.

## 2021-07-31 IMAGING — CT CT HEAD W/O CM
3 series · 15 of 47 positions shown, 18 images · non-contrast
Comparison: CT head and cervical spine dated 05/10/2020.

CLINICAL DATA: Fall from standing with head and neck pain.

EXAM:
CT HEAD WITHOUT CONTRAST
CT CERVICAL SPINE WITHOUT CONTRAST
TECHNIQUE: Multidetector CT imaging of the head and cervical spine was
performed following the standard protocol without intravenous
contrast. Multiplanar CT image reconstructions of the cervical spine
were also generated.

[Series 3: head 5.0 h30s · axial · 0.39mm/px · z∈[-135,-10]mm · 9 of 30 slices shown, 12 images]
[im 3/30  brain]
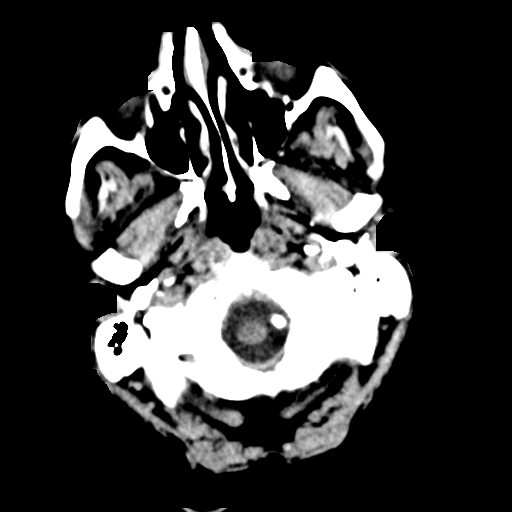
[im 3/30  bone]
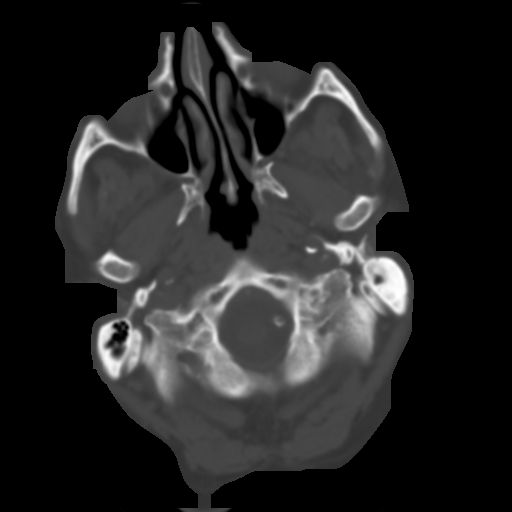
[im 6/30  brain]
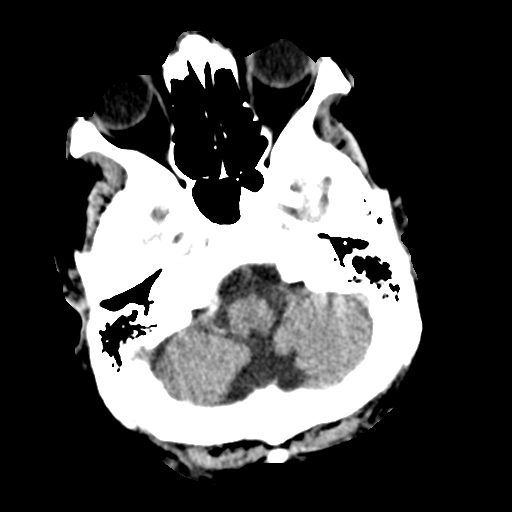
[im 9/30  brain]
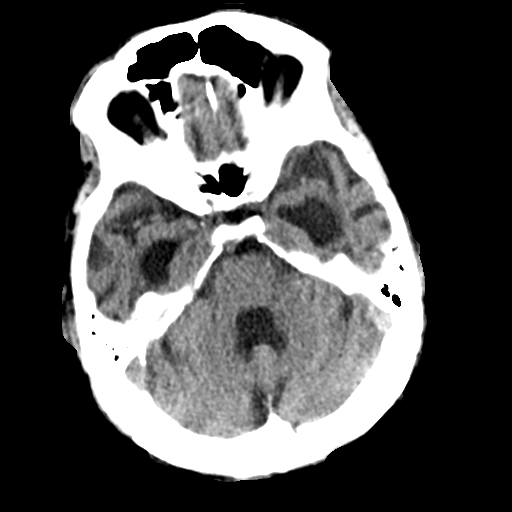
[im 12/30  brain]
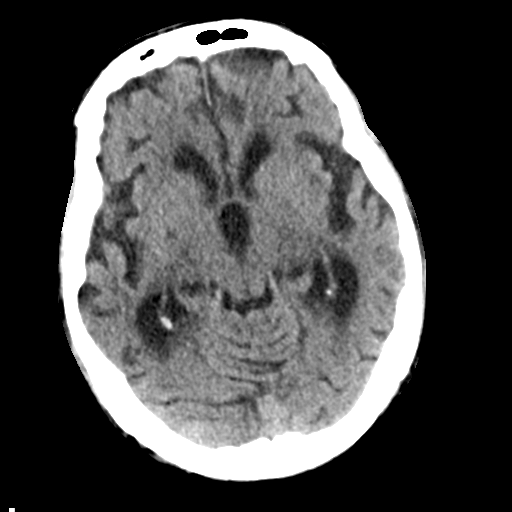
[im 16/30  brain]
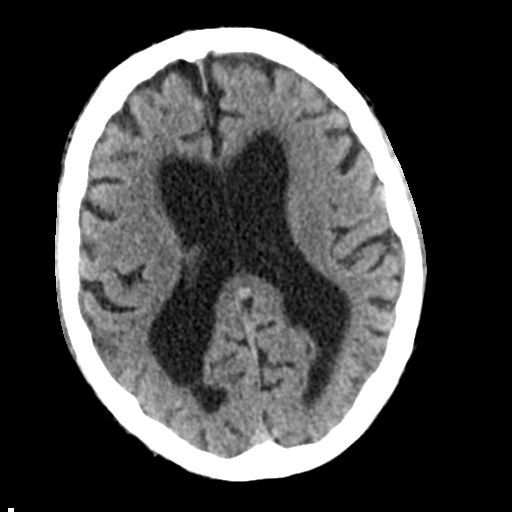
[im 16/30  bone]
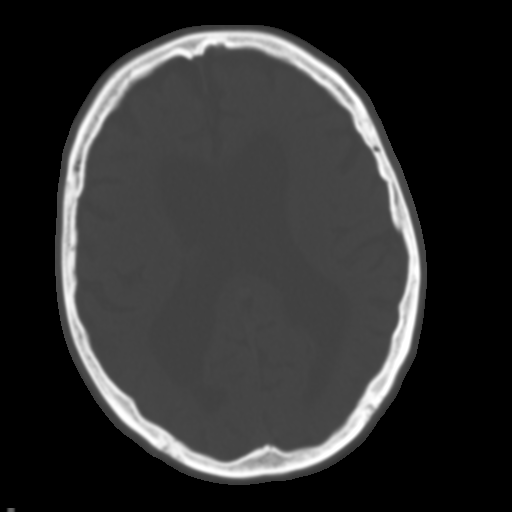
[im 19/30  brain]
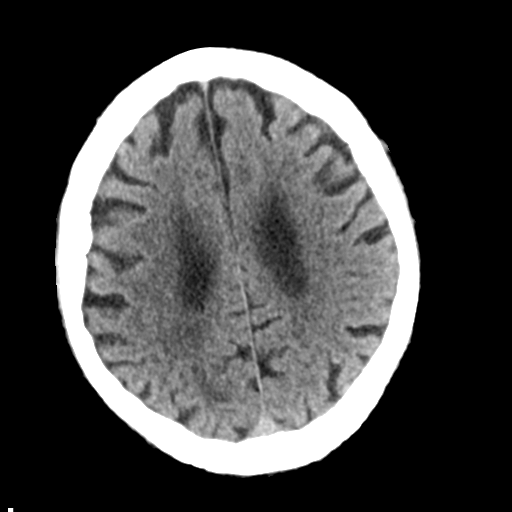
[im 22/30  brain]
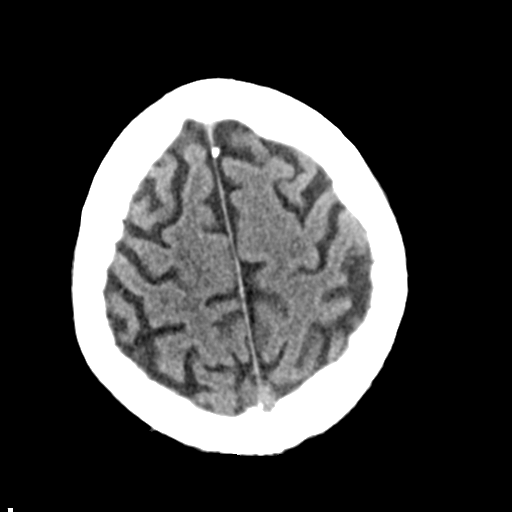
[im 25/30  brain]
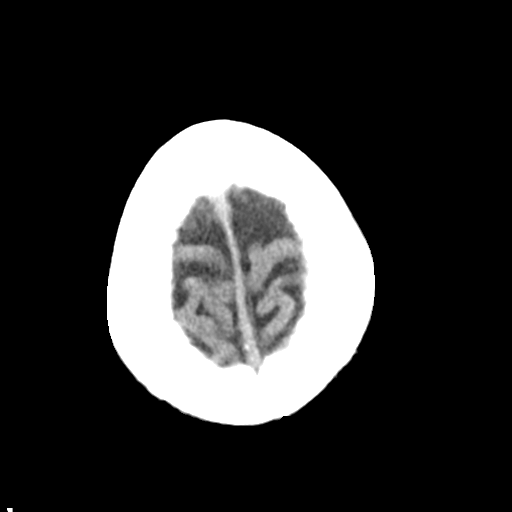
[im 28/30  brain]
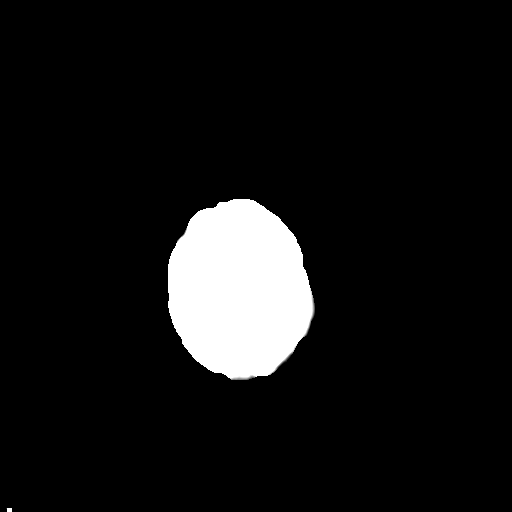
[im 28/30  bone]
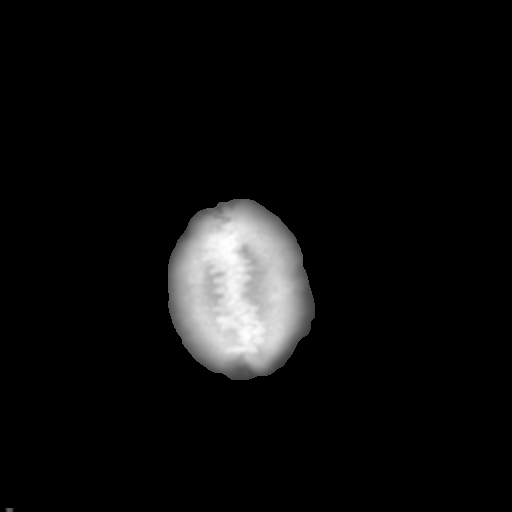

[Series 5: head 3.0 mpr cor · coronal · 0.29mm/px · 3 of 67 slices shown]
[im 23/67  brain]
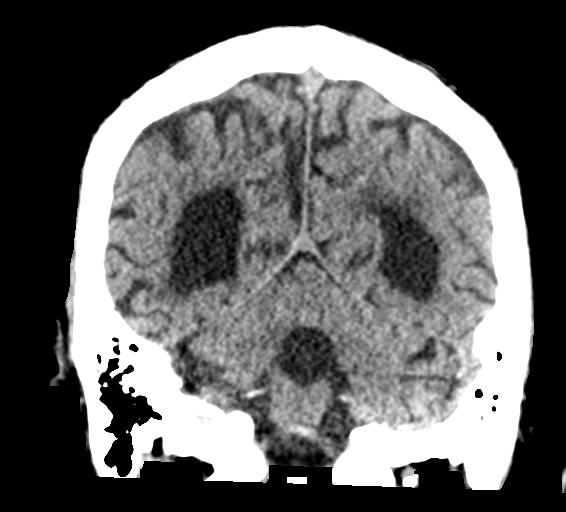
[im 30/67  brain]
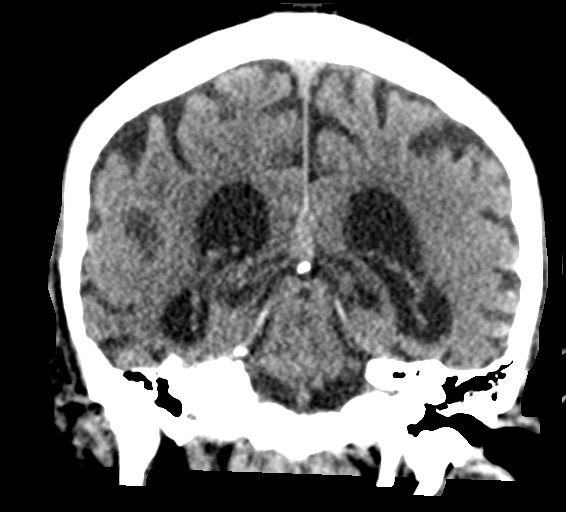
[im 37/67  brain]
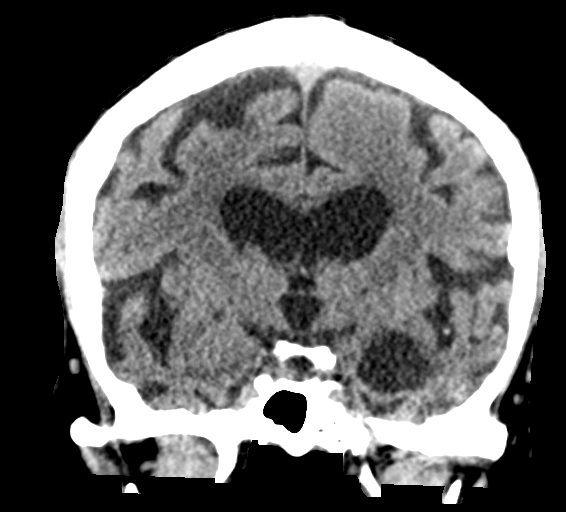

[Series 6: head 3.0 mpr sag · sagittal · 0.29mm/px · 3 of 57 slices shown]
[im 19/57  brain]
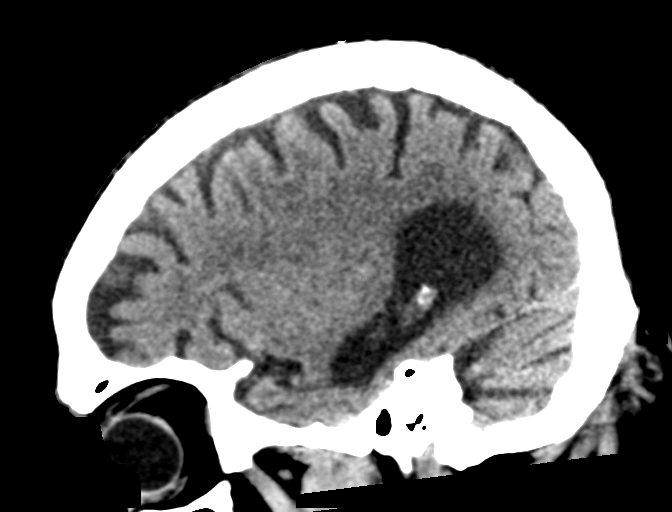
[im 29/57  brain]
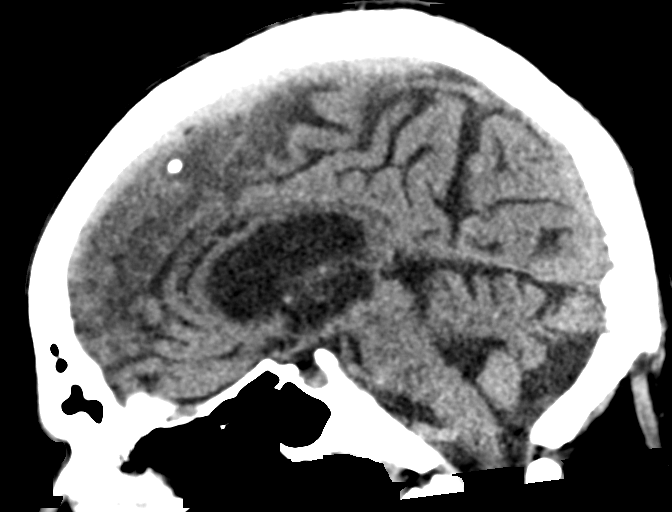
[im 38/57  brain]
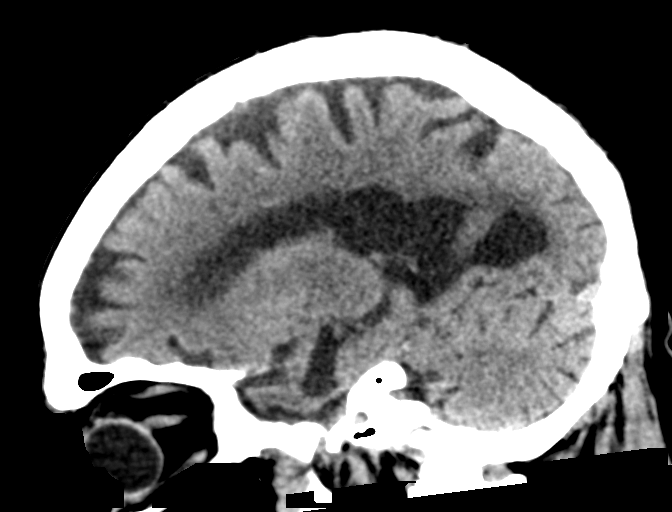

[15 of 47 positions shown; findings below may reference images not displayed]

FINDINGS: CT HEAD FINDINGS

Brain: No evidence of acute infarction, hemorrhage, hydrocephalus,
extra-axial collection or mass lesion/mass effect. There is moderate
to severe cerebral volume loss with associated ex vacuo dilatation.
Periventricular white matter hypoattenuation likely represents
chronic small vessel ischemic disease. The previously seen subdural
hematoma along the right cerebral convexity has since resolved.

Vascular: There are vascular calcifications in the carotid siphons.

Skull: Normal. Negative for fracture or focal lesion.

Sinuses/Orbits: No acute finding.

Other: None.

CT CERVICAL SPINE FINDINGS

Alignment: Normal.

Skull base and vertebrae: No acute fracture. No primary bone lesion
or focal pathologic process.

Soft tissues and spinal canal: No prevertebral fluid or swelling. No
visible canal hematoma.

Disc levels: Moderate multilevel degenerative disc and joint
disease.

Upper chest: Negative.

Other: None.
IMPRESSION: 1. No acute intracranial process.
2. No acute osseous injury in the cervical spine.
